# Patient Record
Sex: Female | Born: 1940
Health system: Southern US, Community
[De-identification: ages and names within clinical notes are randomized; demographics above are authoritative.]

## PROBLEM LIST (undated history)

## (undated) DIAGNOSIS — IMO0001 Reserved for inherently not codable concepts without codable children: Secondary | ICD-10-CM

## (undated) DIAGNOSIS — Z862 Personal history of diseases of the blood and blood-forming organs and certain disorders involving the immune mechanism: Secondary | ICD-10-CM

## (undated) DIAGNOSIS — D649 Anemia, unspecified: Secondary | ICD-10-CM

## (undated) DIAGNOSIS — I10 Essential (primary) hypertension: Secondary | ICD-10-CM

## (undated) DIAGNOSIS — F4310 Post-traumatic stress disorder, unspecified: Secondary | ICD-10-CM

## (undated) DIAGNOSIS — F329 Major depressive disorder, single episode, unspecified: Secondary | ICD-10-CM

## (undated) DIAGNOSIS — I2510 Atherosclerotic heart disease of native coronary artery without angina pectoris: Secondary | ICD-10-CM

## (undated) DIAGNOSIS — F32A Depression, unspecified: Secondary | ICD-10-CM

## (undated) DIAGNOSIS — Z8639 Personal history of other endocrine, nutritional and metabolic disease: Secondary | ICD-10-CM

## (undated) DIAGNOSIS — E785 Hyperlipidemia, unspecified: Secondary | ICD-10-CM

## (undated) DIAGNOSIS — Z5189 Encounter for other specified aftercare: Secondary | ICD-10-CM

## (undated) DIAGNOSIS — M1990 Unspecified osteoarthritis, unspecified site: Secondary | ICD-10-CM

## (undated) DIAGNOSIS — J189 Pneumonia, unspecified organism: Secondary | ICD-10-CM

## (undated) DIAGNOSIS — M7020 Olecranon bursitis, unspecified elbow: Secondary | ICD-10-CM

## (undated) DIAGNOSIS — R011 Cardiac murmur, unspecified: Secondary | ICD-10-CM

## (undated) DIAGNOSIS — E039 Hypothyroidism, unspecified: Secondary | ICD-10-CM

## (undated) HISTORY — DX: Essential (primary) hypertension: I10

## (undated) HISTORY — DX: Personal history of diseases of the blood and blood-forming organs and certain disorders involving the immune mechanism: Z86.2

## (undated) HISTORY — PX: DILATION AND CURETTAGE OF UTERUS: SHX78

## (undated) HISTORY — DX: Hypothyroidism, unspecified: E03.9

## (undated) HISTORY — PX: KNEE ARTHROSCOPY: SUR90

## (undated) HISTORY — DX: Atherosclerotic heart disease of native coronary artery without angina pectoris: I25.10

## (undated) HISTORY — PX: EXPLORATORY LAPAROTOMY: SUR591

## (undated) HISTORY — PX: JOINT REPLACEMENT: SHX530

## (undated) HISTORY — DX: Unspecified osteoarthritis, unspecified site: M19.90

## (undated) HISTORY — PX: COLONOSCOPY: SHX174

## (undated) HISTORY — DX: Personal history of other endocrine, nutritional and metabolic disease: Z86.39

## (undated) HISTORY — PX: FRACTURE SURGERY: SHX138

## (undated) HISTORY — PX: TONSILLECTOMY AND ADENOIDECTOMY: SUR1326

## (undated) HISTORY — PX: TOTAL SHOULDER REPLACEMENT: SUR1217

## (undated) HISTORY — DX: Anemia, unspecified: D64.9

## (undated) HISTORY — DX: Hyperlipidemia, unspecified: E78.5

## (undated) HISTORY — PX: OTHER SURGICAL HISTORY: SHX169

## (undated) HISTORY — DX: Olecranon bursitis, unspecified elbow: M70.20

---

## 1974-02-20 DIAGNOSIS — F4310 Post-traumatic stress disorder, unspecified: Secondary | ICD-10-CM

## 1974-02-20 HISTORY — DX: Post-traumatic stress disorder, unspecified: F43.10

## 1979-02-21 HISTORY — PX: ABDOMINAL HYSTERECTOMY: SHX81

## 1979-02-21 HISTORY — PX: APPENDECTOMY: SHX54

## 1988-10-21 HISTORY — PX: SHOULDER ARTHROSCOPY W/ ROTATOR CUFF REPAIR: SHX2400

## 1988-10-21 HISTORY — PX: PARTIAL KNEE ARTHROPLASTY: SHX2174

## 1988-10-21 HISTORY — PX: CARPAL TUNNEL RELEASE: SHX101

## 1988-10-21 HISTORY — PX: TRANSFER / TRANSPLANT ANKLE TENDON SUPERFICIAL / DEEP: SUR1365

## 1988-10-21 HISTORY — PX: CHOLECYSTECTOMY: SHX55

## 1998-03-01 ENCOUNTER — Encounter: Payer: Self-pay | Admitting: Orthopedic Surgery

## 1998-03-01 ENCOUNTER — Ambulatory Visit (HOSPITAL_COMMUNITY): Admission: RE | Admit: 1998-03-01 | Discharge: 1998-03-01 | Payer: Self-pay | Admitting: Orthopedic Surgery

## 1998-05-17 ENCOUNTER — Other Ambulatory Visit: Admission: RE | Admit: 1998-05-17 | Discharge: 1998-05-17 | Payer: Self-pay | Admitting: Gynecology

## 1998-05-28 ENCOUNTER — Ambulatory Visit (HOSPITAL_COMMUNITY): Admission: RE | Admit: 1998-05-28 | Discharge: 1998-05-28 | Payer: Self-pay | Admitting: Interventional Cardiology

## 1998-09-01 ENCOUNTER — Ambulatory Visit (HOSPITAL_COMMUNITY): Admission: RE | Admit: 1998-09-01 | Discharge: 1998-09-01 | Payer: Self-pay | Admitting: Gastroenterology

## 1998-10-22 HISTORY — PX: SHOULDER ARTHROSCOPY: SHX128

## 1999-07-13 ENCOUNTER — Other Ambulatory Visit: Admission: RE | Admit: 1999-07-13 | Discharge: 1999-07-13 | Payer: Self-pay | Admitting: Gynecology

## 2000-04-12 ENCOUNTER — Ambulatory Visit (HOSPITAL_BASED_OUTPATIENT_CLINIC_OR_DEPARTMENT_OTHER): Admission: RE | Admit: 2000-04-12 | Discharge: 2000-04-12 | Payer: Self-pay | Admitting: Orthopedic Surgery

## 2000-04-12 ENCOUNTER — Encounter (INDEPENDENT_AMBULATORY_CARE_PROVIDER_SITE_OTHER): Payer: Self-pay | Admitting: Specialist

## 2000-06-01 ENCOUNTER — Encounter: Payer: Self-pay | Admitting: Emergency Medicine

## 2000-06-01 ENCOUNTER — Emergency Department (HOSPITAL_COMMUNITY): Admission: EM | Admit: 2000-06-01 | Discharge: 2000-06-01 | Payer: Self-pay | Admitting: Emergency Medicine

## 2001-01-19 ENCOUNTER — Encounter: Payer: Self-pay | Admitting: Emergency Medicine

## 2001-01-19 ENCOUNTER — Emergency Department (HOSPITAL_COMMUNITY): Admission: EM | Admit: 2001-01-19 | Discharge: 2001-01-19 | Payer: Self-pay | Admitting: Emergency Medicine

## 2001-02-20 HISTORY — PX: CARDIAC VALVE REPLACEMENT: SHX585

## 2001-02-20 HISTORY — PX: CARDIAC CATHETERIZATION: SHX172

## 2001-02-20 HISTORY — PX: CORONARY ARTERY BYPASS GRAFT: SHX141

## 2001-07-26 ENCOUNTER — Encounter: Admission: RE | Admit: 2001-07-26 | Discharge: 2001-07-26 | Payer: Self-pay | Admitting: Interventional Cardiology

## 2001-07-26 ENCOUNTER — Encounter: Payer: Self-pay | Admitting: Interventional Cardiology

## 2001-07-29 ENCOUNTER — Ambulatory Visit (HOSPITAL_COMMUNITY): Admission: RE | Admit: 2001-07-29 | Discharge: 2001-07-29 | Payer: Self-pay | Admitting: Interventional Cardiology

## 2001-09-16 ENCOUNTER — Encounter: Payer: Self-pay | Admitting: Cardiothoracic Surgery

## 2001-09-18 ENCOUNTER — Encounter: Payer: Self-pay | Admitting: Cardiothoracic Surgery

## 2001-09-18 ENCOUNTER — Encounter (INDEPENDENT_AMBULATORY_CARE_PROVIDER_SITE_OTHER): Payer: Self-pay | Admitting: Specialist

## 2001-09-18 ENCOUNTER — Inpatient Hospital Stay (HOSPITAL_COMMUNITY): Admission: RE | Admit: 2001-09-18 | Discharge: 2001-09-23 | Payer: Self-pay | Admitting: Cardiothoracic Surgery

## 2001-09-19 ENCOUNTER — Encounter: Payer: Self-pay | Admitting: Cardiothoracic Surgery

## 2001-09-20 ENCOUNTER — Encounter: Payer: Self-pay | Admitting: Cardiothoracic Surgery

## 2001-09-22 ENCOUNTER — Encounter: Payer: Self-pay | Admitting: Thoracic Surgery (Cardiothoracic Vascular Surgery)

## 2001-10-28 ENCOUNTER — Encounter (HOSPITAL_COMMUNITY): Admission: RE | Admit: 2001-10-28 | Discharge: 2002-01-26 | Payer: Self-pay | Admitting: Interventional Cardiology

## 2001-11-28 ENCOUNTER — Encounter: Payer: Self-pay | Admitting: Cardiothoracic Surgery

## 2001-11-28 ENCOUNTER — Encounter: Admission: RE | Admit: 2001-11-28 | Discharge: 2001-11-28 | Payer: Self-pay | Admitting: Cardiothoracic Surgery

## 2002-01-27 ENCOUNTER — Encounter (HOSPITAL_COMMUNITY): Admission: RE | Admit: 2002-01-27 | Discharge: 2002-02-03 | Payer: Self-pay | Admitting: Interventional Cardiology

## 2002-06-05 ENCOUNTER — Encounter: Payer: Self-pay | Admitting: Cardiothoracic Surgery

## 2002-06-05 ENCOUNTER — Encounter: Admission: RE | Admit: 2002-06-05 | Discharge: 2002-06-05 | Payer: Self-pay | Admitting: Cardiothoracic Surgery

## 2002-08-07 ENCOUNTER — Other Ambulatory Visit: Admission: RE | Admit: 2002-08-07 | Discharge: 2002-08-07 | Payer: Self-pay | Admitting: Gynecology

## 2003-11-16 ENCOUNTER — Ambulatory Visit (HOSPITAL_COMMUNITY): Admission: RE | Admit: 2003-11-16 | Discharge: 2003-11-16 | Payer: Self-pay | Admitting: Gastroenterology

## 2003-11-16 ENCOUNTER — Encounter (INDEPENDENT_AMBULATORY_CARE_PROVIDER_SITE_OTHER): Payer: Self-pay | Admitting: *Deleted

## 2003-12-30 ENCOUNTER — Ambulatory Visit: Payer: Self-pay | Admitting: Internal Medicine

## 2004-03-22 ENCOUNTER — Ambulatory Visit: Payer: Self-pay | Admitting: Internal Medicine

## 2004-08-08 ENCOUNTER — Encounter: Payer: Self-pay | Admitting: Internal Medicine

## 2004-11-10 ENCOUNTER — Ambulatory Visit: Payer: Self-pay | Admitting: Internal Medicine

## 2005-04-11 ENCOUNTER — Ambulatory Visit: Payer: Self-pay | Admitting: Internal Medicine

## 2005-05-23 ENCOUNTER — Ambulatory Visit: Payer: Self-pay | Admitting: Internal Medicine

## 2005-06-07 ENCOUNTER — Ambulatory Visit: Payer: Self-pay | Admitting: Internal Medicine

## 2005-12-19 ENCOUNTER — Ambulatory Visit: Payer: Self-pay | Admitting: Internal Medicine

## 2005-12-19 LAB — CONVERTED CEMR LAB
AST: 29 units/L (ref 0–37)
Alkaline Phosphatase: 71 units/L (ref 39–117)
Cholesterol: 184 mg/dL (ref 0–200)
Total Protein: 7.2 g/dL (ref 6.0–8.3)

## 2006-02-01 ENCOUNTER — Encounter: Admission: RE | Admit: 2006-02-01 | Discharge: 2006-02-01 | Payer: Self-pay | Admitting: Cardiothoracic Surgery

## 2006-02-07 ENCOUNTER — Encounter: Payer: Self-pay | Admitting: Internal Medicine

## 2006-02-15 ENCOUNTER — Inpatient Hospital Stay (HOSPITAL_COMMUNITY): Admission: RE | Admit: 2006-02-15 | Discharge: 2006-02-17 | Payer: Self-pay | Admitting: Orthopedic Surgery

## 2006-04-26 ENCOUNTER — Ambulatory Visit: Payer: Self-pay | Admitting: Internal Medicine

## 2006-09-07 ENCOUNTER — Encounter: Admission: RE | Admit: 2006-09-07 | Discharge: 2006-09-07 | Payer: Self-pay | Admitting: Specialist

## 2006-09-14 DIAGNOSIS — I10 Essential (primary) hypertension: Secondary | ICD-10-CM

## 2006-09-14 DIAGNOSIS — E039 Hypothyroidism, unspecified: Secondary | ICD-10-CM

## 2006-09-14 HISTORY — DX: Essential (primary) hypertension: I10

## 2006-09-14 HISTORY — DX: Hypothyroidism, unspecified: E03.9

## 2006-10-01 ENCOUNTER — Encounter: Payer: Self-pay | Admitting: Internal Medicine

## 2006-12-10 ENCOUNTER — Telehealth: Payer: Self-pay | Admitting: Internal Medicine

## 2006-12-19 ENCOUNTER — Ambulatory Visit: Payer: Self-pay | Admitting: Internal Medicine

## 2006-12-21 ENCOUNTER — Encounter: Admission: RE | Admit: 2006-12-21 | Discharge: 2006-12-21 | Payer: Self-pay | Admitting: Orthopedic Surgery

## 2007-01-07 ENCOUNTER — Encounter: Payer: Self-pay | Admitting: Internal Medicine

## 2007-01-31 ENCOUNTER — Telehealth: Payer: Self-pay | Admitting: Internal Medicine

## 2007-02-07 ENCOUNTER — Ambulatory Visit: Payer: Self-pay | Admitting: Internal Medicine

## 2007-02-07 DIAGNOSIS — E785 Hyperlipidemia, unspecified: Secondary | ICD-10-CM

## 2007-02-07 DIAGNOSIS — M1990 Unspecified osteoarthritis, unspecified site: Secondary | ICD-10-CM

## 2007-02-07 HISTORY — DX: Hyperlipidemia, unspecified: E78.5

## 2007-02-07 HISTORY — DX: Unspecified osteoarthritis, unspecified site: M19.90

## 2007-02-08 ENCOUNTER — Encounter: Payer: Self-pay | Admitting: Internal Medicine

## 2007-03-15 ENCOUNTER — Inpatient Hospital Stay (HOSPITAL_COMMUNITY): Admission: RE | Admit: 2007-03-15 | Discharge: 2007-03-17 | Payer: Self-pay | Admitting: Orthopedic Surgery

## 2007-07-04 ENCOUNTER — Encounter: Payer: Self-pay | Admitting: Internal Medicine

## 2007-07-31 ENCOUNTER — Ambulatory Visit: Payer: Self-pay | Admitting: Internal Medicine

## 2007-07-31 DIAGNOSIS — N390 Urinary tract infection, site not specified: Secondary | ICD-10-CM | POA: Insufficient documentation

## 2007-07-31 LAB — CONVERTED CEMR LAB
Bilirubin Urine: NEGATIVE
Glucose, Urine, Semiquant: NEGATIVE
Urobilinogen, UA: NEGATIVE
WBC Urine, dipstick: NEGATIVE

## 2007-08-26 ENCOUNTER — Ambulatory Visit: Payer: Self-pay | Admitting: Internal Medicine

## 2007-08-26 DIAGNOSIS — I2510 Atherosclerotic heart disease of native coronary artery without angina pectoris: Secondary | ICD-10-CM

## 2007-08-26 DIAGNOSIS — M7020 Olecranon bursitis, unspecified elbow: Secondary | ICD-10-CM

## 2007-08-26 HISTORY — DX: Olecranon bursitis, unspecified elbow: M70.20

## 2007-08-26 HISTORY — DX: Atherosclerotic heart disease of native coronary artery without angina pectoris: I25.10

## 2007-09-11 ENCOUNTER — Telehealth: Payer: Self-pay | Admitting: Internal Medicine

## 2007-10-30 ENCOUNTER — Encounter: Payer: Self-pay | Admitting: Internal Medicine

## 2007-11-29 ENCOUNTER — Ambulatory Visit: Payer: Self-pay | Admitting: Internal Medicine

## 2007-11-29 DIAGNOSIS — S93409A Sprain of unspecified ligament of unspecified ankle, initial encounter: Secondary | ICD-10-CM | POA: Insufficient documentation

## 2008-01-24 ENCOUNTER — Encounter: Payer: Self-pay | Admitting: Internal Medicine

## 2008-04-09 ENCOUNTER — Ambulatory Visit: Payer: Self-pay | Admitting: Internal Medicine

## 2008-04-09 LAB — CONVERTED CEMR LAB
Bilirubin Urine: NEGATIVE
Blood in Urine, dipstick: NEGATIVE
Nitrite: NEGATIVE
Protein, U semiquant: NEGATIVE
Specific Gravity, Urine: 1.02
WBC Urine, dipstick: NEGATIVE

## 2008-04-20 ENCOUNTER — Telehealth: Payer: Self-pay | Admitting: Internal Medicine

## 2008-04-20 ENCOUNTER — Ambulatory Visit: Payer: Self-pay | Admitting: Internal Medicine

## 2008-04-20 DIAGNOSIS — J069 Acute upper respiratory infection, unspecified: Secondary | ICD-10-CM | POA: Insufficient documentation

## 2008-05-29 ENCOUNTER — Inpatient Hospital Stay (HOSPITAL_COMMUNITY): Admission: RE | Admit: 2008-05-29 | Discharge: 2008-06-01 | Payer: Self-pay | Admitting: Orthopedic Surgery

## 2008-08-16 ENCOUNTER — Encounter: Admission: RE | Admit: 2008-08-16 | Discharge: 2008-08-16 | Payer: Self-pay | Admitting: Orthopedic Surgery

## 2008-11-30 ENCOUNTER — Ambulatory Visit: Payer: Self-pay | Admitting: Internal Medicine

## 2008-12-03 LAB — CONVERTED CEMR LAB
ALT: 61 units/L — ABNORMAL HIGH (ref 0–35)
AST: 106 units/L — ABNORMAL HIGH (ref 0–37)
Alkaline Phosphatase: 103 units/L (ref 39–117)
Basophils Relative: 0.1 % (ref 0.0–3.0)
CO2: 31 meq/L (ref 19–32)
Calcium: 9.5 mg/dL (ref 8.4–10.5)
Chloride: 99 meq/L (ref 96–112)
Eosinophils Absolute: 0.1 10*3/uL (ref 0.0–0.7)
Lymphocytes Relative: 16.7 % (ref 12.0–46.0)
MCHC: 32.9 g/dL (ref 30.0–36.0)
Neutro Abs: 2.7 10*3/uL (ref 1.4–7.7)
Neutrophils Relative %: 69.3 % (ref 43.0–77.0)
Potassium: 3.7 meq/L (ref 3.5–5.1)
RBC: 3.9 M/uL (ref 3.87–5.11)
RDW: 14.9 % — ABNORMAL HIGH (ref 11.5–14.6)
TSH: 3.96 microintl units/mL (ref 0.35–5.50)
Total Protein: 7 g/dL (ref 6.0–8.3)

## 2008-12-04 ENCOUNTER — Telehealth: Payer: Self-pay | Admitting: Internal Medicine

## 2008-12-07 ENCOUNTER — Telehealth: Payer: Self-pay | Admitting: Internal Medicine

## 2009-01-12 ENCOUNTER — Ambulatory Visit: Payer: Self-pay | Admitting: Internal Medicine

## 2009-01-12 DIAGNOSIS — Z8639 Personal history of other endocrine, nutritional and metabolic disease: Secondary | ICD-10-CM

## 2009-01-12 DIAGNOSIS — Z862 Personal history of diseases of the blood and blood-forming organs and certain disorders involving the immune mechanism: Secondary | ICD-10-CM

## 2009-01-12 HISTORY — DX: Personal history of diseases of the blood and blood-forming organs and certain disorders involving the immune mechanism: Z86.2

## 2009-01-19 LAB — CONVERTED CEMR LAB
Alkaline Phosphatase: 108 units/L (ref 39–117)
Bilirubin, Direct: 0.1 mg/dL (ref 0.0–0.3)

## 2009-04-27 ENCOUNTER — Ambulatory Visit: Payer: Self-pay | Admitting: Internal Medicine

## 2009-04-29 ENCOUNTER — Encounter: Payer: Self-pay | Admitting: Internal Medicine

## 2009-04-29 ENCOUNTER — Telehealth: Payer: Self-pay | Admitting: Internal Medicine

## 2009-05-03 ENCOUNTER — Telehealth: Payer: Self-pay | Admitting: Internal Medicine

## 2009-05-21 ENCOUNTER — Telehealth: Payer: Self-pay | Admitting: Internal Medicine

## 2009-06-18 ENCOUNTER — Inpatient Hospital Stay (HOSPITAL_COMMUNITY): Admission: RE | Admit: 2009-06-18 | Discharge: 2009-06-21 | Payer: Self-pay | Admitting: Orthopedic Surgery

## 2009-08-05 ENCOUNTER — Telehealth: Payer: Self-pay | Admitting: Internal Medicine

## 2009-08-06 ENCOUNTER — Ambulatory Visit: Payer: Self-pay | Admitting: Internal Medicine

## 2009-08-06 DIAGNOSIS — H9209 Otalgia, unspecified ear: Secondary | ICD-10-CM | POA: Insufficient documentation

## 2009-09-14 ENCOUNTER — Encounter: Payer: Self-pay | Admitting: Internal Medicine

## 2009-09-15 ENCOUNTER — Telehealth: Payer: Self-pay | Admitting: Internal Medicine

## 2009-09-15 ENCOUNTER — Ambulatory Visit: Payer: Self-pay | Admitting: Internal Medicine

## 2009-09-15 DIAGNOSIS — D649 Anemia, unspecified: Secondary | ICD-10-CM

## 2009-09-15 HISTORY — DX: Anemia, unspecified: D64.9

## 2009-09-17 ENCOUNTER — Ambulatory Visit: Payer: Self-pay | Admitting: Internal Medicine

## 2009-09-17 LAB — CONVERTED CEMR LAB
Basophils Absolute: 0 10*3/uL (ref 0.0–0.1)
Basophils Relative: 0.1 % (ref 0.0–3.0)
Eosinophils Absolute: 0.1 10*3/uL (ref 0.0–0.7)
Eosinophils Relative: 0.4 % (ref 0.0–5.0)
HCT: 22 % — CL (ref 36.0–46.0)
Hemoglobin: 7.4 g/dL — CL (ref 12.0–15.0)
Lymphocytes Relative: 12.2 % (ref 12.0–46.0)
MCV: 94.6 fL (ref 78.0–100.0)
Monocytes Absolute: 1.3 10*3/uL — ABNORMAL HIGH (ref 0.1–1.0)
Monocytes Relative: 9.2 % (ref 3.0–12.0)
Neutro Abs: 11.4 10*3/uL — ABNORMAL HIGH (ref 1.4–7.7)
Neutrophils Relative %: 78.1 % — ABNORMAL HIGH (ref 43.0–77.0)
Platelets: 433 10*3/uL — ABNORMAL HIGH (ref 150.0–400.0)
RDW: 16.2 % — ABNORMAL HIGH (ref 11.5–14.6)

## 2009-09-18 ENCOUNTER — Inpatient Hospital Stay (HOSPITAL_COMMUNITY): Admission: EM | Admit: 2009-09-18 | Discharge: 2009-09-19 | Payer: Self-pay | Admitting: Emergency Medicine

## 2009-09-24 ENCOUNTER — Ambulatory Visit: Payer: Self-pay | Admitting: Internal Medicine

## 2009-09-24 LAB — CONVERTED CEMR LAB
Basophils Absolute: 0 10*3/uL (ref 0.0–0.1)
Basophils Relative: 0.5 % (ref 0.0–3.0)
Eosinophils Absolute: 0.1 10*3/uL (ref 0.0–0.7)
Hemoglobin: 10.6 g/dL — ABNORMAL LOW (ref 12.0–15.0)
Lymphocytes Relative: 14.4 % (ref 12.0–46.0)
Monocytes Absolute: 0.8 10*3/uL (ref 0.1–1.0)
Platelets: 281 10*3/uL (ref 150.0–400.0)

## 2009-10-31 ENCOUNTER — Emergency Department (HOSPITAL_COMMUNITY): Admission: EM | Admit: 2009-10-31 | Discharge: 2009-10-31 | Payer: Self-pay | Admitting: Emergency Medicine

## 2009-11-02 ENCOUNTER — Emergency Department (HOSPITAL_COMMUNITY): Admission: EM | Admit: 2009-11-02 | Discharge: 2009-11-02 | Payer: Self-pay | Admitting: Family Medicine

## 2009-11-04 ENCOUNTER — Emergency Department (HOSPITAL_COMMUNITY): Admission: EM | Admit: 2009-11-04 | Discharge: 2009-11-04 | Payer: Self-pay | Admitting: Family Medicine

## 2009-11-10 ENCOUNTER — Encounter (HOSPITAL_BASED_OUTPATIENT_CLINIC_OR_DEPARTMENT_OTHER)
Admission: RE | Admit: 2009-11-10 | Discharge: 2010-02-08 | Payer: Self-pay | Source: Home / Self Care | Attending: General Surgery | Admitting: General Surgery

## 2009-11-18 ENCOUNTER — Encounter: Admission: RE | Admit: 2009-11-18 | Discharge: 2009-11-18 | Payer: Self-pay | Admitting: Orthopedic Surgery

## 2009-11-22 ENCOUNTER — Ambulatory Visit (HOSPITAL_COMMUNITY)
Admission: RE | Admit: 2009-11-22 | Discharge: 2009-11-22 | Payer: Self-pay | Source: Home / Self Care | Admitting: General Surgery

## 2009-11-30 ENCOUNTER — Encounter: Admission: RE | Admit: 2009-11-30 | Discharge: 2009-11-30 | Payer: Self-pay | Admitting: Orthopedic Surgery

## 2009-12-14 ENCOUNTER — Ambulatory Visit: Payer: Self-pay | Admitting: Internal Medicine

## 2009-12-14 LAB — CONVERTED CEMR LAB
Basophils Absolute: 0 10*3/uL (ref 0.0–0.1)
Eosinophils Absolute: 0.1 10*3/uL (ref 0.0–0.7)
HCT: 37 % (ref 36.0–46.0)
Hemoglobin: 12.5 g/dL (ref 12.0–15.0)
Lymphocytes Relative: 17.9 % (ref 12.0–46.0)
Monocytes Relative: 10 % (ref 3.0–12.0)
Neutro Abs: 4.5 10*3/uL (ref 1.4–7.7)
Neutrophils Relative %: 69.7 % (ref 43.0–77.0)
Platelets: 269 10*3/uL (ref 150.0–400.0)
RDW: 15.7 % — ABNORMAL HIGH (ref 11.5–14.6)

## 2010-01-17 ENCOUNTER — Telehealth: Payer: Self-pay | Admitting: Internal Medicine

## 2010-02-10 ENCOUNTER — Encounter (HOSPITAL_BASED_OUTPATIENT_CLINIC_OR_DEPARTMENT_OTHER)
Admission: RE | Admit: 2010-02-10 | Discharge: 2010-03-08 | Payer: Self-pay | Source: Home / Self Care | Attending: General Surgery | Admitting: General Surgery

## 2010-03-08 ENCOUNTER — Ambulatory Visit
Admission: RE | Admit: 2010-03-08 | Discharge: 2010-03-08 | Payer: Self-pay | Source: Home / Self Care | Attending: Internal Medicine | Admitting: Internal Medicine

## 2010-03-12 ENCOUNTER — Encounter: Payer: Self-pay | Admitting: Orthopedic Surgery

## 2010-03-13 ENCOUNTER — Encounter: Payer: Self-pay | Admitting: Cardiothoracic Surgery

## 2010-03-14 ENCOUNTER — Encounter
Admission: RE | Admit: 2010-03-14 | Discharge: 2010-03-14 | Payer: Self-pay | Source: Home / Self Care | Attending: Orthopedic Surgery | Admitting: Orthopedic Surgery

## 2010-03-24 NOTE — Assessment & Plan Note (Signed)
Summary: CONSTANT COUGHING FOR 3 WEEKS//ALP   Vital Signs:  Patient profile:   70 year old female Height:      62.25 inches Weight:      125 pounds BMI:     22.76 Temp:     98.1 degrees F oral Pulse rate:   70 / minute Resp:     14 per minute BP sitting:   130 / 80  (left arm)  Vitals Entered By: Willy Eddy, LPN (December 14, 2009 11:03 AM) CC: c/o non productive cough Is Patient Diabetic? No   CC:  c/o non productive cough.  History of Present Illness: a 70 year old patient who presents with a chief complaint call for 3 weeks duration.  Cough is nonproductive.  She presently is on antibiotic therapy for a wound infection involving the left leg.  A wound VAC is also in place, and she is followed closely by the wound center.  Presently is on doxycycline.  Denies any fever or chills.  She does have a history of hypertension, dyslipidemia, coronary artery disease.  She has a history of recent acute blood loss anemia  Preventive Screening-Counseling & Management  Alcohol-Tobacco     Smoking Status: never  Current Problems (verified): 1)  Coumadin Therapy  (ICD-V58.61) 2)  Anemia  (ICD-285.9) 3)  Ear Pain, Right  (ICD-388.70) 4)  Liver Function Tests, Abnormal, Hx of  (ICD-V12.2) 5)  Uri  (ICD-465.9) 6)  Ankle Sprain, Right  (ICD-845.00) 7)  Olecranon Bursitis, Left  (ICD-726.33) 8)  Coronary Artery Disease  (ICD-414.00) 9)  Uti  (ICD-599.0) 10)  Osteoarthritis  (ICD-715.90) 11)  Hyperlipidemia  (ICD-272.4) 12)  Hypothyroidism  (ICD-244.9) 13)  Hypertension  (ICD-401.9)  Current Medications (verified): 1)  Budeprion Sr 150 Mg Tb12 (Bupropion Hcl) .Marland Kitchen.. 1 Qd 2)  Lipitor 20 Mg Tabs (Atorvastatin Calcium) .Marland Kitchen.. 1 Once Daily 3)  Synthroid 100 Mcg Tabs (Levothyroxine Sodium) .Marland Kitchen.. 1 Once Daily 4)  Trazodone Hcl 100 Mg Tabs (Trazodone Hcl) .Marland Kitchen.. 1 At Bedtime 5)  Verapamil Hcl Cr 240 Mg Tbcr (Verapamil Hcl) .Marland Kitchen.. 1 Once Daily 6)  Warfarin Sodium 5 Mg Tabs (Warfarin Sodium) ....  As Dir 7)  Vicodin 5-500 Mg  Tabs (Hydrocodone-Acetaminophen) .Marland Kitchen.. 1 Q6h As Needed 8)  Ferrous Sulfate 325 (65 Fe) Mg Tabs (Ferrous Sulfate) .... Qd  Allergies (verified): 1)  Demerol (Meperidine Hcl) 2)  Dilaudid 3)  Morphine Sulfate (Morphine Sulfate)  Past History:  Past Medical History: Reviewed history from 09/17/2009 and no changes required. Hypertension Hypothyroidism Hyperlipidemia status post mechanical aortic valve repair history of anxiety, depression Osteoarthritis Coronary artery disease hyperparathyroidism postoperative anemia  Past Surgical History: Reviewed history from 08/06/2009 and no changes required. Cholecystectomy laparoscopic 1999 Hysterectomy Multiple knee, ankle and wrist sx colonoscopy in 2000  multiple orthopedic procedures status post CABG and aortic valve repair 2003 R Shoulder surgery (Rotator cuff repair)  right total shoulder replacement surgery April 2011  Review of Systems       The patient complains of prolonged cough.  The patient denies anorexia, fever, weight loss, weight gain, vision loss, decreased hearing, hoarseness, chest pain, syncope, dyspnea on exertion, peripheral edema, headaches, hemoptysis, abdominal pain, melena, hematochezia, severe indigestion/heartburn, hematuria, incontinence, genital sores, muscle weakness, suspicious skin lesions, transient blindness, difficulty walking, depression, unusual weight change, abnormal bleeding, enlarged lymph nodes, angioedema, and breast masses.    Physical Exam  General:  Well-developed,well-nourished,in no acute distress; alert,appropriate and cooperative throughout examination; blood pressure, normal Head:  Normocephalic and atraumatic without obvious  abnormalities. No apparent alopecia or balding. Eyes:  No corneal or conjunctival inflammation noted. EOMI. Perrla. Funduscopic exam benign, without hemorrhages, exudates or papilledema. Vision grossly normal. Ears:  both canals  occluded by cerumen Nose:  External nasal examination shows no deformity or inflammation. Nasal mucosa are pink and moist without lesions or exudates. Mouth:  Oral mucosa and oropharynx without lesions or exudates.  Teeth in good repair. Neck:  No deformities, masses, or tenderness noted. Chest Wall:  No deformities, masses, or tenderness noted. Lungs:  Normal respiratory effort, chest expands symmetrically. Lungs are clear to auscultation, no crackles or wheezes. Heart:  regular rhythm prosthetic heart sounds number Abdomen:  Bowel sounds positive,abdomen soft and non-tender without masses, organomegaly or hernias noted. Extremities:  wound VAC in place.  Left lower leg   Impression & Recommendations:  Problem # 1:  ANEMIA (ICD-285.9)  Her updated medication list for this problem includes:    Ferrous Sulfate 325 (65 Fe) Mg Tabs (Ferrous sulfate) ..... Qd    Her updated medication list for this problem includes:    Ferrous Sulfate 325 (65 Fe) Mg Tabs (Ferrous sulfate) ..... Qd  Orders: Venipuncture (08657) TLB-CBC Platelet - w/Differential (85025-CBCD) Specimen Handling (84696)  Problem # 2:  COUMADIN THERAPY (ICD-V58.61)  Problem # 3:  URI (ICD-465.9)  Her updated medication list for this problem includes:    Hydrocodone-homatropine 5-1.5 Mg/88ml Syrp (Hydrocodone-homatropine) .Marland Kitchen... 1 teaspoon every 6 hours as needed for cough  Her updated medication list for this problem includes:    Hydrocodone-homatropine 5-1.5 Mg/80ml Syrp (Hydrocodone-homatropine) .Marland Kitchen... 1 teaspoon every 6 hours as needed for cough  Complete Medication List: 1)  Budeprion Sr 150 Mg Tb12 (Bupropion hcl) .Marland Kitchen.. 1 qd 2)  Lipitor 20 Mg Tabs (Atorvastatin calcium) .Marland Kitchen.. 1 once daily 3)  Synthroid 100 Mcg Tabs (Levothyroxine sodium) .Marland Kitchen.. 1 once daily 4)  Trazodone Hcl 100 Mg Tabs (Trazodone hcl) .Marland Kitchen.. 1 at bedtime 5)  Verapamil Hcl Cr 240 Mg Tbcr (Verapamil hcl) .Marland Kitchen.. 1 once daily 6)  Warfarin Sodium 5 Mg Tabs  (Warfarin sodium) .... As dir 7)  Vicodin 5-500 Mg Tabs (Hydrocodone-acetaminophen) .Marland Kitchen.. 1 q6h as needed 8)  Ferrous Sulfate 325 (65 Fe) Mg Tabs (Ferrous sulfate) .... Qd 9)  Hydrocodone-homatropine 5-1.5 Mg/88ml Syrp (Hydrocodone-homatropine) .Marland Kitchen.. 1 teaspoon every 6 hours as needed for cough  Patient Instructions: 1)  Please schedule a follow-up appointment in 3 months. 2)  Limit your Sodium (Salt). 3)  Get plenty of rest, drink lots of clear liquids, and use Tylenol  for fever and comfort. Return in 7-10 days if you're not better:sooner if you're feeling worse. Prescriptions: HYDROCODONE-HOMATROPINE 5-1.5 MG/5ML SYRP (HYDROCODONE-HOMATROPINE) 1 teaspoon every 6 hours as needed for cough  #6 oz x 0   Entered and Authorized by:   Gordy Savers  MD   Signed by:   Gordy Savers  MD on 12/14/2009   Method used:   Print then Give to Patient   RxID:   [id number]    Orders Added: 1)  Venipuncture [25366] 2)  TLB-CBC Platelet - w/Differential [85025-CBCD] 3)  Est. Patient Level III [44034] 4)  Specimen Handling [99000]

## 2010-03-24 NOTE — Assessment & Plan Note (Signed)
Summary: f/u bloodwk - ok per Dr/ K     KIK   Vital Signs:  Patient profile:   70 year old female Temp:     98.0 degrees F oral BP sitting:   100 / 60  (right arm) Cuff size:   regular  Vitals Entered By: Duard Brady LPN (September 24, 2009 2:39 PM) CC: F/U  on labs and blood rec'd this past weekend Is Patient Diabetic? No   CC:  F/U  on labs and blood rec'd this past weekend.  History of Present Illness: 70 year old patient who is [facility] for blood loss anemia.  She is on chronic Coumadin and received a cortisone injection in the left hip area.  This resulted in considerable blood loss in the soft tissues.  Hemoglobin dropped to 7.4, and she was admitted and received one unit of packed RBCs.  She was discharged 5 days ago and has done well.  Still slightly weak, but improving.  She remains on chronic Coumadin, status post about replacement surgery  Allergies: 1)  Demerol (Meperidine Hcl) 2)  Dilaudid 3)  Morphine Sulfate (Morphine Sulfate)  Past History:  Past Medical History: Reviewed history from 09/17/2009 and no changes required. Hypertension Hypothyroidism Hyperlipidemia status post mechanical aortic valve repair history of anxiety, depression Osteoarthritis Coronary artery disease hyperparathyroidism postoperative anemia  Review of Systems       The patient complains of muscle weakness.  The patient denies anorexia, fever, weight loss, weight gain, vision loss, decreased hearing, hoarseness, chest pain, syncope, dyspnea on exertion, peripheral edema, prolonged cough, headaches, hemoptysis, abdominal pain, melena, hematochezia, severe indigestion/heartburn, hematuria, incontinence, genital sores, suspicious skin lesions, transient blindness, difficulty walking, depression, unusual weight change, abnormal bleeding, enlarged lymph nodes, angioedema, and breast masses.    Physical Exam  General:  Well-developed,well-nourished,in no acute  distress; alert,appropriate and cooperative throughout examination Head:  Normocephalic and atraumatic without obvious abnormalities. No apparent alopecia or balding. Mouth:  Oral mucosa and oropharynx without lesions or exudates.  Teeth in good repair. Neck:  No deformities, masses, or tenderness noted. Lungs:  Normal respiratory effort, chest expands symmetrically. Lungs are clear to auscultation, no crackles or wheezes. Heart:  rate approximately 90 prosthetic heart sounds   Impression & Recommendations:  Problem # 1:  COUMADIN THERAPY (ICD-V58.61)  Orders: Protime (10272ZD)  Problem # 2:  ANEMIA (ICD-285.9)  Her updated medication list for this problem includes:    Ferrous Sulfate 325 (65 Fe) Mg Tabs (Ferrous sulfate) ..... Qd  Orders: Venipuncture (66440) TLB-CBC Platelet - w/Differential (85025-CBCD) Hgb (34742)  Her updated medication list for this problem includes:    Ferrous Sulfate 325 (65 Fe) Mg Tabs (Ferrous sulfate) ..... Qd  Complete Medication List: 1)  Budeprion Sr 150 Mg Tb12 (Bupropion hcl) .Marland Kitchen.. 1 qd 2)  Lipitor 20 Mg Tabs (Atorvastatin calcium) .Marland Kitchen.. 1 once daily 3)  Synthroid 100 Mcg Tabs (Levothyroxine sodium) .Marland Kitchen.. 1 once daily 4)  Trazodone Hcl 100 Mg Tabs (Trazodone hcl) .Marland Kitchen.. 1 at bedtime 5)  Verapamil Hcl Cr 240 Mg Tbcr (Verapamil hcl) .Marland Kitchen.. 1 once daily 6)  Warfarin Sodium 5 Mg Tabs (Warfarin sodium) .... As dir 7)  Vicodin 5-500 Mg Tabs (Hydrocodone-acetaminophen) .Marland Kitchen.. 1 q6h as needed 8)  Avapro 300 Mg Tabs (Irbesartan) .... Qd 9)  Ferrous Sulfate 325 (65 Fe) Mg Tabs (Ferrous sulfate) .... Qd  Patient Instructions: 1)  Please schedule a follow-up appointment in 2  months. 2)  Limit your Sodium (Salt).  Appended Document:  f/u bloodwk - ok per Dr/ K     KIK   ANTICOAGULATION RECORD PREVIOUS REGIMEN & LAB RESULTS   Previous INR:  1.4 on  09/17/2009  Previous Regimen:  same on  09/17/2009  NEW REGIMEN & LAB RESULTS Current INR: 1.0 Regimen:  Same Coagulation Comments: Was off coumadin for 6 days was in hosptial Repeat testing in: 2 weeks  Anticoagulation Visit Questionnaire Coumadin dose missed/changed:  Yes Coumadin Dose Comments:  outside prescriber changed dose or tablet strength Abnormal Bleeding Symptoms:  Yes  Any diet changes including alcohol intake, vegetables or greens since the last visit:  No Any illnesses or hospitalizations since the last visit:  Yes Any signs of clotting since the last visit (including chest discomfort, dizziness, shortness of breath, arm tingling, slurred speech, swelling or redness in leg):  No  MEDICATIONS BUDEPRION SR 150 MG TB12 (BUPROPION HCL) 1 qd LIPITOR 20 MG TABS (ATORVASTATIN CALCIUM) 1 once daily SYNTHROID 100 MCG TABS (LEVOTHYROXINE SODIUM) 1 once daily TRAZODONE HCL 100 MG TABS (TRAZODONE HCL) 1 at bedtime VERAPAMIL HCL CR 240 MG TBCR (VERAPAMIL HCL) 1 once daily WARFARIN SODIUM 5 MG TABS (WARFARIN SODIUM) as dir VICODIN 5-500 MG  TABS (HYDROCODONE-ACETAMINOPHEN) 1 q6h as needed AVAPRO 300 MG TABS (IRBESARTAN) qd FERROUS SULFATE 325 (65 FE) MG TABS (FERROUS SULFATE) qd    Laboratory Results   Blood Tests   Date/Time Received: September 24, 2009    INR: 1.0   (Normal Range: 0.88-1.12   Therap INR: 2.0-3.5) Comments: Kathrynn Speed CMA  September 24, 2009 3:51 PM

## 2010-03-24 NOTE — Letter (Signed)
Summary: Generic Letter  [facility]  [address] [place], [place] [address]   Phone: [phone]  Fax: [phone]    04/29/2009  [name] [address] [place], [place]  [address]  Dear Paula Ferguson:  [name] is a patient followed in our internal medicine practice.  She was evaluated recently for a preoperative evaluation prior to elective  shoulder surgery.  Her cardiac status is stable and she remains on chronic Coumadin anticoagulation following aortic valve replacement.  There are no contraindications for the anticipated surgery.  She will require bridge Lovenox anticoagulation in the perioperative period.  This will be managed by her cardiologist.  Please do not hesitate to contact this office if there are perioperative concerns.          Sincerely,   Paula Chiquito  MD  Appended Document: Generic Letter letter faxed Attn: Clydie Braun

## 2010-03-24 NOTE — Letter (Signed)
Summary: [facility]  [facility]   Imported By: Maryln Gottron 09/20/2009 12:52:57  _____________________________________________________________________  External Attachment:    Type:   Image     Comment:   External Document

## 2010-03-24 NOTE — Progress Notes (Signed)
Summary: medical clearence needed for surg.   Phone Note From Other Clinic   Caller: Nurse Call For: medical clearence for surg. Summary of Call: karen with Dr. [name] called requesting letter of medical clearence for shoulder surg. Fax [phone] Atten: Clydie Braun pt was seen  04/27/09 in our office.  Initial call taken by: Duard Brady LPN,  April 29, 2009 10:52 AM  Follow-up for Phone Call        will fax today Follow-up by: Gordy Savers  MD,  April 29, 2009 12:49 PM     Appended Document: medical clearence needed for surg.  faxed

## 2010-03-24 NOTE — Assessment & Plan Note (Signed)
Summary: hgb is 8 gm?/dm   Vital Signs:  Patient profile:   70 year old female Weight:      124 pounds Temp:     98.4 degrees F oral BP sitting:   120 / 70  (right arm) Cuff size:   regular  Vitals Entered By: Duard Brady LPN (September 15, 2009 10:27 AM) CC: c/o pain and 'knot' on (L) hip after injection from Dr. [name] last week , also was at [facility] for coumadin check - h/h very low Is Patient Diabetic? No   CC:  c/o pain and 'knot' on (L) hip after injection from Dr. [name] last week  and also was at [facility] for coumadin check - h/h very low.  History of Present Illness: 70 year old patient who has recent undergone right shoulder surgery on April 29.  Seven days ago, she received an apparent Cortizone  injection in the left hip area.  Since this time, she has had pain and swelling.  She also describes some minimal discomfort in the left medial thigh region.  She is on Coumadin anticoagulation.  Following aortic valve repair.  Yesterday she is noted to have significant postoperative anemia with a hemoglobin of 8.2.  She is treated hypertension and dyslipidemia.  INR was 3.8.  She held her Coumadin yesterday.  She also held.  Both her blood pressure medications today for the past week.  She has felt some increasing fatigue.  Denies any melena or abdominal pain  Allergies: 1)  Demerol (Meperidine Hcl) 2)  Dilaudid 3)  Morphine Sulfate (Morphine Sulfate)  Past History:  Past Medical History: Reviewed history from 08/26/2007 and no changes required. Hypertension Hypothyroidism Hyperlipidemia status post mechanical aortic valve repair history of anxiety, depression Osteoarthritis Coronary artery disease hyperparathyroidism  Review of Systems       The patient complains of muscle weakness and suspicious skin lesions.  The patient denies anorexia, fever, weight loss, weight gain, vision loss, decreased hearing, hoarseness, chest pain, syncope, dyspnea on exertion, peripheral edema,  prolonged cough, headaches, hemoptysis, abdominal pain, melena, hematochezia, severe indigestion/heartburn, hematuria, incontinence, genital sores, transient blindness, difficulty walking, depression, unusual weight change, abnormal bleeding, enlarged lymph nodes, angioedema, and breast masses.    Physical Exam  General:  Well-developed,well-nourished,in no acute distress; alert,appropriate and cooperative throughout examination; 120/70, pulse rate 104 Head:  Normocephalic and atraumatic without obvious abnormalities. No apparent alopecia or balding. Eyes:  No corneal or conjunctival inflammation noted. EOMI. Perrla. Funduscopic exam benign, without hemorrhages, exudates or papilledema. Vision grossly normal. Mouth:  Oral mucosa and oropharynx without lesions or exudates.  Teeth in good repair. Neck:  No deformities, masses, or tenderness noted. Lungs:  Normal respiratory effort, chest expands symmetrically. Lungs are clear to auscultation, no crackles or wheezes. Heart:  grade 2/6 murmur tachycardia with frequent ectopics Abdomen:  Bowel sounds positive,abdomen soft and non-tender without masses, organomegaly or hernias noted. Rectal:  heme-negative stool and light brown Msk:  there is a smaller hematoma with ecchymoses over her left hip area.  There is also some ecchymoses that were fairly extensive, involving her left medial thigh   Impression & Recommendations:  Problem # 1:  ANEMIA (ICD-285.9) will hold her Coumadin again today and resume tomorrow.  Will recheck in two days with follow-up hemoglobin and hematocrit.  Will place on iron supplementation.  Lead that anemia is due to blood loss both postop and also bleeding into her soft tissues  Problem # 2:  CORONARY ARTERY DISEASE (ICD-414.00)  The following medications were  removed from the medication list:    Hydrochlorothiazide 12.5 Mg Caps (Hydrochlorothiazide) .Marland Kitchen... Take one tab by mouth once daily Her updated medication list for  this problem includes:    Verapamil Hcl Cr 240 Mg Tbcr (Verapamil hcl) .Marland Kitchen... 1 once daily    Avapro 300 Mg Tabs (Irbesartan) ..... Qd  Problem # 3:  HYPERTENSION (ICD-401.9)  The following medications were removed from the medication list:    Hydrochlorothiazide 12.5 Mg Caps (Hydrochlorothiazide) .Marland Kitchen... Take one tab by mouth once daily Her updated medication list for this problem includes:    Verapamil Hcl Cr 240 Mg Tbcr (Verapamil hcl) .Marland Kitchen... 1 once daily    Avapro 300 Mg Tabs (Irbesartan) ..... Qd will hold Avapro and recheck blood pressure in 48 hours  Complete Medication List: 1)  Budeprion Sr 150 Mg Tb12 (Bupropion hcl) .Marland Kitchen.. 1 qd 2)  Lipitor 20 Mg Tabs (Atorvastatin calcium) .Marland Kitchen.. 1 once daily 3)  Synthroid 100 Mcg Tabs (Levothyroxine sodium) .Marland Kitchen.. 1 once daily 4)  Trazodone Hcl 100 Mg Tabs (Trazodone hcl) .Marland Kitchen.. 1 at bedtime 5)  Verapamil Hcl Cr 240 Mg Tbcr (Verapamil hcl) .Marland Kitchen.. 1 once daily 6)  Warfarin Sodium 5 Mg Tabs (Warfarin sodium) .... As dir 7)  Vicodin 5-500 Mg Tabs (Hydrocodone-acetaminophen) .Marland Kitchen.. 1 q6h as needed 8)  Avapro 300 Mg Tabs (Irbesartan) .... Qd  Patient Instructions: 1)  supplemental  iron 3 times daily 2)  hold Coumadin today and resume tomorrow 3)  office visit in two days 4)  call if you develops any worsening weakness or dizziness 5)  hold Avapro

## 2010-03-24 NOTE — Assessment & Plan Note (Signed)
Summary: medical clearance/cjr   Vital Signs:  Ferguson profile:   70 year old female Height:      62.25 inches Weight:      123 pounds BMI:     22.40 Temp:     98.1 degrees F 140rectal BP sitting:   140 / 88  (left arm) Cuff size:   regular  Vitals Entered By: Duard Brady LPN (April 27, 1608 12:58 PM) CC: medical clearence for (R) shoulder surg.  Is Ferguson Diabetic? No   CC:  medical clearence for (R) shoulder surg. Marland Kitchen  History of Present Illness: Paula Ferguson who is seen today for follow-up and medical clearance prior to elective right shoulder surgery.  She has a history of coronary artery disease and is status post aortic valve replacement.  She is on chronic Coumadin therapy and will require bridge therapy with Lovenox prior to her surgery.  This is managed by cardiology.  She has treated hypertension, dyslipidemia, hypothyroidism.  Her cardiac status has been stable.  She denies any exertional chest pain or shortness of breath.  Her INR was therapeutic.  Last week  Preventive Screening-Counseling & Management  Alcohol-Tobacco     Smoking Status: never  Allergies: 1)  Demerol (Meperidine Hcl) 2)  Dilaudid 3)  Morphine Sulfate (Morphine Sulfate)  Past History:  Past Medical History: Reviewed history from 08/26/2007 and no changes required. Hypertension Hypothyroidism Hyperlipidemia status post mechanical aortic valve repair history of anxiety, depression Osteoarthritis Coronary artery disease hyperparathyroidism  Review of Systems  The Ferguson denies anorexia, fever, weight loss, weight gain, vision loss, decreased hearing, hoarseness, chest pain, syncope, dyspnea on exertion, peripheral edema, prolonged cough, headaches, hemoptysis, abdominal pain, melena, hematochezia, severe indigestion/heartburn, hematuria, incontinence, genital sores, muscle weakness, suspicious skin lesions, transient blindness, difficulty walking, depression, unusual weight  change, abnormal bleeding, enlarged lymph nodes, angioedema, and breast masses.    Physical Exam  General:  Well-developed,well-nourished,in no acute distress; alert,appropriate and cooperative throughout examination; 134/80 Head:  Normocephalic and atraumatic without obvious abnormalities. No apparent alopecia or balding. Eyes:  No corneal or conjunctival inflammation noted. EOMI. Perrla. Funduscopic exam benign, without hemorrhages, exudates or papilledema. Vision grossly normal. Mouth:  Oral mucosa and oropharynx without lesions or exudates.  Teeth in good repair. Neck:  No deformities, masses, or tenderness noted. Lungs:  Normal respiratory effort, chest expands symmetrically. Lungs are clear to auscultation, no crackles or wheezes. Heart:  prosthetic heart sounds.  No murmur Abdomen:  Bowel sounds positive,abdomen soft and non-tender without masses, organomegaly or hernias noted. Msk:  osteoarthritic changes Extremities:  no edema Skin:  Intact without suspicious lesions or rashes Cervical Nodes:  No lymphadenopathy noted Psych:  Cognition and judgment appear intact. Alert and cooperative with normal attention span and concentration. No apparent delusions, illusions, hallucinations   Impression & Recommendations:  Problem # 1:  CORONARY ARTERY DISEASE (ICD-414.00)  The following medications were removed from the medication list:    Hydrochlorothiazide 12.5 Mg Tabs (Hydrochlorothiazide) ..... One daily Her updated medication list for this problem includes:    Avalide 300-12.5 Mg Tabs (Irbesartan-hydrochlorothiazide) .Marland Kitchen... Take 1 tablet by mouth every morning    Verapamil Hcl Cr 240 Mg Tbcr (Verapamil hcl) .Marland Kitchen... 1 once daily  Problem # 2:  OSTEOARTHRITIS (ICD-715.90)  Her updated medication list for this problem includes:    Vicodin 5-500 Mg Tabs (Hydrocodone-acetaminophen) .Marland Kitchen... 1 q6h as needed  Problem # 3:  HYPERTENSION (ICD-401.9)  The following medications were removed  from the medication list:  Hydrochlorothiazide 12.5 Mg Tabs (Hydrochlorothiazide) ..... One daily Her updated medication list for this problem includes:    Avalide 300-12.5 Mg Tabs (Irbesartan-hydrochlorothiazide) .Marland Kitchen... Take 1 tablet by mouth every morning    Verapamil Hcl Cr 240 Mg Tbcr (Verapamil hcl) .Marland Kitchen... 1 once daily  Problem # 4:  HYPOTHYROIDISM (ICD-244.9)  Her updated medication list for this problem includes:    Synthroid 100 Mcg Tabs (Levothyroxine sodium) .Marland Kitchen... 1 once daily  Complete Medication List: 1)  Avalide 300-12.5 Mg Tabs (Irbesartan-hydrochlorothiazide) .... Take 1 tablet by mouth every morning 2)  Budeprion Sr 150 Mg Tb12 (Bupropion hcl) .Marland Kitchen.. 1 qd 3)  Lipitor 20 Mg Tabs (Atorvastatin calcium) .Marland Kitchen.. 1 once daily 4)  Synthroid 100 Mcg Tabs (Levothyroxine sodium) .Marland Kitchen.. 1 once daily 5)  Trazodone Hcl 100 Mg Tabs (Trazodone hcl) .Marland Kitchen.. 1 at bedtime 6)  Verapamil Hcl Cr 240 Mg Tbcr (Verapamil hcl) .Marland Kitchen.. 1 once daily 7)  Warfarin Sodium 5 Mg Tabs (Warfarin sodium) .... As dir 8)  Vicodin 5-500 Mg Tabs (Hydrocodone-acetaminophen) .Marland Kitchen.. 1 q6h as needed 9)  Amoxicillin 500 Mg Caps (Amoxicillin) .... Take 4 prior to each dental procedure (30-60  minutes prior)  Ferguson Instructions: 1)  Please schedule a follow-up appointment in 6 months. 2)  Limit your Sodium (Salt). 3)  cardiology follow-up as scheduled Prescriptions: AMOXICILLIN 500 MG  CAPS (AMOXICILLIN) Take 4 prior to each dental procedure (30-60  minutes prior)  ##12 x 0   Entered and Authorized by:   Gordy Savers  MD   Signed by:   Gordy Savers  MD on 04/27/2009   Method used:   Print then Give to Ferguson   RxID:   [id number] VICODIN 5-500 MG  TABS (HYDROCODONE-ACETAMINOPHEN) 1 q6h as needed  #90 x 4   Entered and Authorized by:   Gordy Savers  MD   Signed by:   Gordy Savers  MD on 04/27/2009   Method used:   Print then Give to Ferguson   RxID:   [id number] WARFARIN SODIUM 5 MG  TABS (WARFARIN SODIUM) as dir  #50 Tablet x 3   Entered and Authorized by:   Gordy Savers  MD   Signed by:   Gordy Savers  MD on 04/27/2009   Method used:   Print then Give to Ferguson   RxID:   [id number] VERAPAMIL HCL CR 240 MG TBCR (VERAPAMIL HCL) 1 once daily  #90 Tablet x 4   Entered and Authorized by:   Gordy Savers  MD   Signed by:   Gordy Savers  MD on 04/27/2009   Method used:   Print then Give to Ferguson   RxID:   [id number] TRAZODONE HCL 100 MG TABS (TRAZODONE HCL) 1 at bedtime  #90 x 6   Entered and Authorized by:   Gordy Savers  MD   Signed by:   Gordy Savers  MD on 04/27/2009   Method used:   Print then Give to Ferguson   RxID:   [id number] SYNTHROID 100 MCG TABS (LEVOTHYROXINE SODIUM) 1 once daily  #90 Tablet x 4   Entered and Authorized by:   Gordy Savers  MD   Signed by:   Gordy Savers  MD on 04/27/2009   Method used:   Print then Give to Ferguson   RxID:   [id number] LIPITOR 20 MG TABS (ATORVASTATIN CALCIUM) 1 once daily  #90 x 8   Entered and Authorized by:  Gordy Savers  MD   Signed by:   Gordy Savers  MD on 04/27/2009   Method used:   Print then Give to Ferguson   RxID:   [id number] BUDEPRION SR 150 MG TB12 (BUPROPION HCL) 1 qd  #90 x 5   Entered and Authorized by:   Gordy Savers  MD   Signed by:   Gordy Savers  MD on 04/27/2009   Method used:   Print then Give to Ferguson   RxID:   [id number] AVALIDE 300-12.5 MG TABS (IRBESARTAN-HYDROCHLOROTHIAZIDE) Take 1 tablet by mouth every morning  #90 x 4   Entered and Authorized by:   Gordy Savers  MD   Signed by:   Gordy Savers  MD on 04/27/2009   Method used:   Print then Give to Ferguson   RxID:   [id number]

## 2010-03-24 NOTE — Progress Notes (Signed)
Summary: Pt called in distress. Bad reaction to cortizone inj. Pls call   Call back at Home Phone [phone]   Caller: Patient Summary of Call: Pt called and said that she had a cortizone inj by Dr. Patsy Lager on 09/08/09 and now there is severe brusing, a lump where inj was administered and pain like bee stings around the area. Pt also said that her hemoglobin lvl is 8.  Pt in distress.    Initial call taken by: Lucy Antigua,  September 15, 2009 8:49 AM  Follow-up for Phone Call        Scheduled work in appt with Dr. Kirtland Bouchard to address anemia.........8 gm hgb. Follow-up by: Lynann Beaver CMA,  September 15, 2009 9:02 AM

## 2010-03-24 NOTE — Assessment & Plan Note (Signed)
Summary: COUGH AT NIGHT/OK PER KIM/NJR/pt rsc/cjr   Vital Signs:  Ferguson profile:   70 year old female Weight:      124 pounds Temp:     98.4 degrees F oral BP sitting:   120 / 80  (right arm) Cuff size:   regular  Vitals Entered By: Duard Brady LPN (March 08, 2010 11:14 AM) CC: still with cough - nonproductive   Is Ferguson Diabetic? No   CC:  still with cough - nonproductive  .  History of Present Illness: Paula Ferguson who is seen today for follow-up.  She has a history of dyslipidemia, coronary artery disease, status post aortic valve repair.  She is on chronic warfarin anticoagulation.  She has a history of osteoarthritis and remains quite symptomatic, especially knee and shoulder areas.  Her lung, involving her left lower extremity has nicely healed.  Allergies: 1)  Demerol (Meperidine Hcl) 2)  Dilaudid 3)  Morphine Sulfate (Morphine Sulfate)  Past History:  Past Medical History: Reviewed history from 09/17/2009 and no changes required. Hypertension Hypothyroidism Hyperlipidemia status post mechanical aortic valve repair history of anxiety, depression Osteoarthritis Coronary artery disease hyperparathyroidism postoperative anemia  Past Surgical History: Reviewed history from 08/06/2009 and no changes required. Cholecystectomy laparoscopic 1999 Hysterectomy Multiple knee, ankle and wrist sx colonoscopy in 2000  multiple orthopedic procedures status post CABG and aortic valve repair 2003 R Shoulder surgery (Rotator cuff repair)  right total shoulder replacement surgery April 2011  Review of Systems       The Ferguson complains of difficulty walking.  The Ferguson denies anorexia, fever, weight loss, weight gain, vision loss, decreased hearing, hoarseness, chest pain, syncope, dyspnea on exertion, peripheral edema, prolonged cough, headaches, hemoptysis, abdominal pain, melena, hematochezia, severe indigestion/heartburn, hematuria, incontinence,  genital sores, muscle weakness, suspicious skin lesions, transient blindness, depression, unusual weight change, abnormal bleeding, enlarged lymph nodes, angioedema, and breast masses.    Physical Exam  General:  Well-developed,well-nourished,in no acute distress; alert,appropriate and cooperative throughout examination Head:  Normocephalic and atraumatic without obvious abnormalities. No apparent alopecia or balding. Mouth:  Oral mucosa and oropharynx without lesions or exudates.  Teeth in good repair. Neck:  No deformities, masses, or tenderness noted. Lungs:  Normal respiratory effort, chest expands symmetrically. Lungs are clear to auscultation, no crackles or wheezes. Heart:  Normal rate and regular rhythm. S1 and S2 normal without gallop, murmur, click, rub or other extra sounds. Abdomen:  Bowel sounds positive,abdomen soft and non-tender without masses, organomegaly or hernias noted. Msk:  No deformity or scoliosis noted of thoracic or lumbar spine.   Pulses:  R and L carotid,radial,femoral,dorsalis pedis and posterior tibial pulses are full and equal bilaterally Extremities:  No clubbing, cyanosis, edema, or deformity noted with normal full range of motion of all joints.     Impression & Recommendations:  Problem # 1:  COUMADIN THERAPY (ICD-V58.61)  Problem # 2:  CORONARY ARTERY DISEASE (ICD-414.00)  Her updated medication list for this problem includes:    Verapamil Hcl Cr 240 Mg Tbcr (Verapamil hcl) .Marland Kitchen... 1 once daily    Losartan Potassium 100 Mg Tabs (Losartan potassium) ..... One daily  Problem # 3:  HYPERLIPIDEMIA (ICD-272.4)  Her updated medication list for this problem includes:    Lipitor 20 Mg Tabs (Atorvastatin calcium) .Marland Kitchen... 1 once daily  Problem # 4:  HYPERTENSION (ICD-401.9)  Her updated medication list for this problem includes:    Verapamil Hcl Cr 240 Mg Tbcr (Verapamil hcl) .Marland Kitchen... 1 once daily  Losartan Potassium 100 Mg Tabs (Losartan potassium) ..... One  daily  Complete Medication List: 1)  Budeprion Sr 150 Mg Tb12 (Bupropion hcl) .Marland Kitchen.. 1 qd 2)  Lipitor 20 Mg Tabs (Atorvastatin calcium) .Marland Kitchen.. 1 once daily 3)  Synthroid 100 Mcg Tabs (Levothyroxine sodium) .Marland Kitchen.. 1 once daily 4)  Trazodone Hcl 100 Mg Tabs (Trazodone hcl) .Marland Kitchen.. 1 at bedtime 5)  Verapamil Hcl Cr 240 Mg Tbcr (Verapamil hcl) .Marland Kitchen.. 1 once daily 6)  Warfarin Sodium 5 Mg Tabs (Warfarin sodium) .... As dir 7)  Vicodin 5-500 Mg Tabs (Hydrocodone-acetaminophen) .Marland Kitchen.. 1 q6h as needed 8)  Ferrous Sulfate 325 (65 Fe) Mg Tabs (Ferrous sulfate) .... Qd 9)  Losartan Potassium 100 Mg Tabs (Losartan potassium) .... One daily  Ferguson Instructions: 1)  Please schedule a follow-up appointment in 6 months. 2)  Limit your Sodium (Salt). 3)  It is important that you exercise regularly at least 20 minutes 5 times a week. If you develop chest pain, have severe difficulty breathing, or feel very tired , stop exercising immediately and seek medical attention. Prescriptions: VICODIN 5-500 MG  TABS (HYDROCODONE-ACETAMINOPHEN) 1 q6h as needed  #90 x 2   Entered and Authorized by:   Gordy Savers  MD   Signed by:   Gordy Savers  MD on 03/08/2010   Method used:   Print then Give to Ferguson   RxID:   [id number] WARFARIN SODIUM 5 MG TABS (WARFARIN SODIUM) as dir  #50 Tablet x 2   Entered and Authorized by:   Gordy Savers  MD   Signed by:   Gordy Savers  MD on 03/08/2010   Method used:   Print then Give to Ferguson   RxID:   [id number] VERAPAMIL HCL CR 240 MG TBCR (VERAPAMIL HCL) 1 once daily  #90 Tablet x 4   Entered and Authorized by:   Gordy Savers  MD   Signed by:   Gordy Savers  MD on 03/08/2010   Method used:   Print then Give to Ferguson   RxID:   [id number] TRAZODONE HCL 100 MG TABS (TRAZODONE HCL) 1 at bedtime  #9-0 x 4   Entered and Authorized by:   Gordy Savers  MD   Signed by:   Gordy Savers  MD on 03/08/2010   Method used:    Print then Give to Ferguson   RxID:   [id number] SYNTHROID 100 MCG TABS (LEVOTHYROXINE SODIUM) 1 once daily  #90 Tablet x 4   Entered and Authorized by:   Gordy Savers  MD   Signed by:   Gordy Savers  MD on 03/08/2010   Method used:   Print then Give to Ferguson   RxID:   [id number] LIPITOR 20 MG TABS (ATORVASTATIN CALCIUM) 1 once daily  #90 x 8   Entered and Authorized by:   Gordy Savers  MD   Signed by:   Gordy Savers  MD on 03/08/2010   Method used:   Print then Give to Ferguson   RxID:   [id number] BUDEPRION SR 150 MG TB12 (BUPROPION HCL) 1 qd  #90 Tablet x 3   Entered and Authorized by:   Gordy Savers  MD   Signed by:   Gordy Savers  MD on 03/08/2010   Method used:   Print then Give to Ferguson   RxID:   [id number]    Orders Added: 1)  Est. Ferguson Level IV [62831]

## 2010-03-24 NOTE — Assessment & Plan Note (Signed)
Summary: ear pain/dm   Vital Signs:  Patient profile:   70 year old female Weight:      122 pounds BP sitting:   100 / 70  (left arm) Cuff size:   regular  Vitals Entered By: Duard Brady LPN (August 06, 2009 11:47 AM) CC: c/o (R) ear pain , swelling Is Patient Diabetic? No   CC:  c/o (R) ear pain  and swelling.  History of Present Illness: 70 year old patient who is seen today for follow-up.  For the past couple days.  She has had some mild sore throat, and right ear discomfort.  The discomfort seems asked to localize to the right postauricular area for the past day or so.  Her symptoms have improved.  She is recovering from a right shoulder surgery in April she is on chronic Coumadin due to aortic valve replacement surgery.  She has treated hypertension.  She has dyslipidemia and hypothyroidism  Allergies: 1)  Demerol (Meperidine Hcl) 2)  Dilaudid 3)  Morphine Sulfate (Morphine Sulfate)  Past History:  Past Medical History: Reviewed history from 08/26/2007 and no changes required. Hypertension Hypothyroidism Hyperlipidemia status post mechanical aortic valve repair history of anxiety, depression Osteoarthritis Coronary artery disease hyperparathyroidism  Past Surgical History: Cholecystectomy laparoscopic 1999 Hysterectomy Multiple knee, ankle and wrist sx colonoscopy in 2000  multiple orthopedic procedures status post CABG and aortic valve repair 2003 R Shoulder surgery (Rotator cuff repair)  right total shoulder replacement surgery April 2011  Review of Systems  The patient denies anorexia, fever, weight loss, weight gain, vision loss, decreased hearing, hoarseness, chest pain, syncope, dyspnea on exertion, peripheral edema, prolonged cough, headaches, hemoptysis, abdominal pain, melena, hematochezia, severe indigestion/heartburn, hematuria, incontinence, genital sores, muscle weakness, suspicious skin lesions, transient blindness, difficulty walking,  depression, unusual weight change, abnormal bleeding, enlarged lymph nodes, angioedema, and breast masses.    Physical Exam  General:  Well-developed,well-nourished,in no acute distress; alert,appropriate and cooperative throughout examination; 110/70 Head:  Normocephalic and atraumatic without obvious abnormalities. No apparent alopecia or balding. Eyes:  No corneal or conjunctival inflammation noted. EOMI. Perrla. Funduscopic exam benign, without hemorrhages, exudates or papilledema. Vision grossly normal. Ears:  cerumen in both canals, but no inflammatory changes Mouth:  Oral mucosa and oropharynx without lesions or exudates.  Teeth in good repair. Neck:  No deformities, masses, or tenderness noted. Chest Wall:  No deformities, masses, or tenderness noted. Lungs:  Normal respiratory effort, chest expands symmetrically. Lungs are clear to auscultation, no crackles or wheezes. Heart:  prosthetic heart sounds Abdomen:  Bowel sounds positive,abdomen soft and non-tender without masses, organomegaly or hernias noted. Msk:  status post right shoulder replacement surgery; residual ecchymoses down the right arm Pulses:  R and L carotid,radial,femoral,dorsalis pedis and posterior tibial pulses are full and equal bilaterally Extremities:  No clubbing, cyanosis, edema, or deformity noted with normal full range of motion of all joints.     Impression & Recommendations:  Problem # 1:  HYPOTHYROIDISM (ICD-244.9)  Her updated medication list for this problem includes:    Synthroid 100 Mcg Tabs (Levothyroxine sodium) .Marland Kitchen... 1 once daily  Problem # 2:  HYPERTENSION (ICD-401.9)  Her updated medication list for this problem includes:    Verapamil Hcl Cr 240 Mg Tbcr (Verapamil hcl) .Marland Kitchen... 1 once daily    Avapro 300 Mg Tabs (Irbesartan) ..... Qd  Problem # 3:  EAR PAIN, RIGHT (ICD-388.70)  The following medications were removed from the medication list:    Amoxicillin 500 Mg Caps (Amoxicillin) .Marland KitchenMarland KitchenMarland KitchenMarland Kitchen  Take 4 prior to each dental procedure (30-60  minutes prior) exam is unremarkable.  Clinically observe at this time  Problem # 4:  HYPERLIPIDEMIA (ICD-272.4)  Her updated medication list for this problem includes:    Lipitor 20 Mg Tabs (Atorvastatin calcium) .Marland Kitchen... 1 once daily  Complete Medication List: 1)  Budeprion Sr 150 Mg Tb12 (Bupropion hcl) .Marland Kitchen.. 1 qd 2)  Lipitor 20 Mg Tabs (Atorvastatin calcium) .Marland Kitchen.. 1 once daily 3)  Synthroid 100 Mcg Tabs (Levothyroxine sodium) .Marland Kitchen.. 1 once daily 4)  Trazodone Hcl 100 Mg Tabs (Trazodone hcl) .Marland Kitchen.. 1 at bedtime 5)  Verapamil Hcl Cr 240 Mg Tbcr (Verapamil hcl) .Marland Kitchen.. 1 once daily 6)  Warfarin Sodium 5 Mg Tabs (Warfarin sodium) .... As dir 7)  Vicodin 5-500 Mg Tabs (Hydrocodone-acetaminophen) .Marland Kitchen.. 1 q6h as needed 8)  Avapro 300 Mg Tabs (Irbesartan) .... Qd  Patient Instructions: 1)  Please schedule a follow-up appointment in 6 months. 2)  Limit your Sodium (Salt) to less than 2 grams a day(slightly less than 1/2 a teaspoon) to prevent fluid retention, swelling, or worsening of symptoms. 3)  It is important that you exercise regularly at least 20 minutes 5 times a week. If you develop chest pain, have severe difficulty breathing, or feel very tired , stop exercising immediately and seek medical attention.

## 2010-03-24 NOTE — Progress Notes (Signed)
Summary: infected ear?  Phone Note Call from Patient Call back at Home Phone [phone]   Caller: Patient---live call Reason for Call: Talk to Nurse Summary of Call: wants to see Dr Kirtland Bouchard today. Has something wrong with the back of her ear. maybe a bug bite. affecting the inside of ear. Initial call taken by: Warnell Forester,  August 05, 2009 10:54 AM  Follow-up for Phone Call        pt cannot get in today.  Scheduled appt tomorrow with Dr. Kirtland Bouchard. Follow-up by: Lynann Beaver CMA,  August 05, 2009 11:00 AM  Additional Follow-up for Phone Call Additional follow up Details #1::        okay Additional Follow-up by: Gordy Savers  MD,  August 05, 2009 12:59 PM

## 2010-03-24 NOTE — Progress Notes (Signed)
Summary: avapro rx called in  Phone Note Call from Patient Call back at Home Phone [phone]   Caller: Patient---live call Summary of Call: Was on Avalide. Has been discontinued. Was given samples Avapro 300mg  1d. Pt is requesting a new rx to be called in to [facility] on [address] ----[phone] Initial call taken by: Warnell Forester,  May 21, 2009 11:19 AM  Follow-up for Phone Call        avapro 300  #90 Follow-up by: Gordy Savers  MD,  May 21, 2009 12:52 PM    New/Updated Medications: AVAPRO 300 MG TABS (IRBESARTAN) qd Prescriptions: AVAPRO 300 MG TABS (IRBESARTAN) qd  #90 x 3   Entered by:   Duard Brady LPN   Authorized by:   Gordy Savers  MD   Signed by:   Duard Brady LPN on 84/16/6063   Method used:   Electronically to        [facility]  [address] [id number] (retail)       [address]       [place], [place]  [address]       Ph: [phone] or [phone]       Fax: [phone]   RxID:   [id number]

## 2010-03-24 NOTE — Progress Notes (Signed)
Summary: flonase and alavert  Phone Note Call from Patient   Caller: Patient Call For: Paula Savers  MD Summary of Call: [facility] Pt is still having sinus complaints.......cheek pain, eye pain. severe congestion.  Requests RX. [phone] Initial call taken by: Lynann Beaver CMA,  May 03, 2009 10:44 AM  Follow-up for Phone Call        spoke with p t- instructed to take alavert once daily and flonase nasal spray once daily - rx for nasal spray to [facility] , if no improvement in 2-3 days or fever - to be seen .  Surgery scheduled for 06/18/09.  KIK Follow-up by: Duard Brady LPN,  May 03, 2009 3:43 PM    New/Updated Medications: FLONASE 50 MCG/ACT SUSP (FLUTICASONE PROPIONATE) once daily ALAVERT 10 MG TABS (LORATADINE) once daily Prescriptions: FLONASE 50 MCG/ACT SUSP (FLUTICASONE PROPIONATE) once daily  #1 x 1   Entered by:   Duard Brady LPN   Authorized by:   Paula Savers  MD   Signed by:   Duard Brady LPN on 62/13/0865   Method used:   [facility]ically to        [facility]  [address] [id number] ([facility]       [address]       [place], [place]  [address]       Ph: [phone] or [phone]       Fax: [phone]   RxID:   [id number]  generic flonase   use daily; otc allavert one daily

## 2010-03-24 NOTE — Miscellaneous (Signed)
Summary: Controlled Substances Contract  Controlled Substances Contract   Imported By: Maryln Gottron 09/17/2009 15:52:32  _____________________________________________________________________  External Attachment:    Type:   Image     Comment:   External Document

## 2010-03-24 NOTE — Progress Notes (Signed)
Summary: refill hydrocodone apap  Phone Note Refill Request Message from:  Fax from Pharmacy on January 17, 2010 3:00 PM  Refills Requested: Medication #1:  VICODIN 5-500 MG  TABS 1 q6h as needed   Last Refilled: 10/15/[facility] [address]    Method Requested: Fax to Local Pharmacy Initial call taken by: Duard Brady LPN,  January 17, 2010 3:00 PM  Follow-up for Phone Call        #90 Follow-up by: Gordy Savers  MD,  January 17, 2010 5:28 PM    Prescriptions: VICODIN 5-500 MG  TABS (HYDROCODONE-ACETAMINOPHEN) 1 q6h as needed  #90 x 0   Entered by:   Duard Brady LPN   Authorized by:   Gordy Savers  MD   Signed by:   Duard Brady LPN on 04/54/0981   Method used:   Historical   RxID:   [id number]  faxed back to [facility]

## 2010-03-24 NOTE — Assessment & Plan Note (Signed)
Summary: 2 day fup/njr   Vital Signs:  Patient profile:   70 year old female Weight:      125 pounds Temp:     98.0 degrees F oral BP sitting:   130 / 70  (left arm) Cuff size:   regular  Vitals Entered By: Duard Brady LPN (September 17, 2009 12:54 PM) CC: 2 day f/u - much better Is Patient Diabetic? No   CC:  2 day f/u - much better.  History of Present Illness: 70 year old patient who is seen today for follow-up of her postop anemia.  She's also had some bleeding into the soft tissues after a IM injection associate with mild Coumadin coagulopathy.  She was seen two days ago and and hemoglobin was 8.2  3 days ago.  She has been on iron and today she feels much improved.  Her Avapro has been placed on hold.  Allergies: 1)  Demerol (Meperidine Hcl) 2)  Dilaudid 3)  Morphine Sulfate (Morphine Sulfate)  Past History:  Past Medical History: Hypertension Hypothyroidism Hyperlipidemia status post mechanical aortic valve repair history of anxiety, depression Osteoarthritis Coronary artery disease hyperparathyroidism postoperative anemia  Physical Exam  General:  Well-developed,well-nourished,in no acute distress; alert,appropriate and cooperative throughout examination; blood pressure 110/70 Neck:  No deformities, masses, or tenderness noted. Lungs:  Normal respiratory effort, chest expands symmetrically. Lungs are clear to auscultation, no crackles or wheezes. Heart:  Normal rate and regular rhythm. S1 and S2 normal without gallop, murmur, click, rub or other extra sounds.  no tachycardia Skin:  resolving ecchymoses on extremities   Impression & Recommendations:  Problem # 1:  ANEMIA (ICD-285.9)  Her updated medication list for this problem includes:    Ferrous Sulfate 325 (65 Fe) Mg Tabs (Ferrous sulfate) ..... Qd  Her updated medication list for this problem includes:    Ferrous Sulfate 325 (65 Fe) Mg Tabs (Ferrous sulfate) ..... Qd  Orders: Venipuncture  (16109) TLB-CBC Platelet - w/Differential (85025-CBCD)  Problem # 2:  HYPERTENSION (ICD-401.9)  Her updated medication list for this problem includes:    Verapamil Hcl Cr 240 Mg Tbcr (Verapamil hcl) .Marland Kitchen... 1 once daily    Avapro 300 Mg Tabs (Irbesartan) ..... Qd will continue to hold Avapro until anemia is improved  Her updated medication list for this problem includes:    Verapamil Hcl Cr 240 Mg Tbcr (Verapamil hcl) .Marland Kitchen... 1 once daily    Avapro 300 Mg Tabs (Irbesartan) ..... Qd  Complete Medication List: 1)  Budeprion Sr 150 Mg Tb12 (Bupropion hcl) .Marland Kitchen.. 1 qd 2)  Lipitor 20 Mg Tabs (Atorvastatin calcium) .Marland Kitchen.. 1 once daily 3)  Synthroid 100 Mcg Tabs (Levothyroxine sodium) .Marland Kitchen.. 1 once daily 4)  Trazodone Hcl 100 Mg Tabs (Trazodone hcl) .Marland Kitchen.. 1 at bedtime 5)  Verapamil Hcl Cr 240 Mg Tbcr (Verapamil hcl) .Marland Kitchen.. 1 once daily 6)  Warfarin Sodium 5 Mg Tabs (Warfarin sodium) .... As dir 7)  Vicodin 5-500 Mg Tabs (Hydrocodone-acetaminophen) .Marland Kitchen.. 1 q6h as needed 8)  Avapro 300 Mg Tabs (Irbesartan) .... Qd 9)  Ferrous Sulfate 325 (65 Fe) Mg Tabs (Ferrous sulfate) .... Qd  Other Orders: Protime ([id number])  Patient Instructions: 1)  Please schedule a follow-up appointment in 1 month. 2)  take iron supplements as directed 3)  hold Avapro  Appended Document: 2 day fup/njr   ANTICOAGULATION RECORD  NEW REGIMEN & LAB RESULTS Current INR: 1.4 Regimen: same  Repeat testing in: 2 weeks  Anticoagulation Visit Questionnaire Coumadin dose missed/changed:  No Abnormal  Bleeding Symptoms:  No  Any diet changes including alcohol intake, vegetables or greens since the last visit:  No Any illnesses or hospitalizations since the last visit:  No Any signs of clotting since the last visit (including chest discomfort, dizziness, shortness of breath, arm tingling, slurred speech, swelling or redness in leg):  Yes  MEDICATIONS BUDEPRION SR 150 MG TB12 (BUPROPION HCL) 1 qd LIPITOR 20 MG TABS  (ATORVASTATIN CALCIUM) 1 once daily SYNTHROID 100 MCG TABS (LEVOTHYROXINE SODIUM) 1 once daily TRAZODONE HCL 100 MG TABS (TRAZODONE HCL) 1 at bedtime VERAPAMIL HCL CR 240 MG TBCR (VERAPAMIL HCL) 1 once daily WARFARIN SODIUM 5 MG TABS (WARFARIN SODIUM) as dir VICODIN 5-500 MG  TABS (HYDROCODONE-ACETAMINOPHEN) 1 q6h as needed AVAPRO 300 MG TABS (IRBESARTAN) qd FERROUS SULFATE 325 (65 FE) MG TABS (FERROUS SULFATE) qd    Laboratory Results   Blood Tests      INR: 1.4   (Normal Range: 0.88-1.12   Therap INR: 2.0-3.5) Comments: Rita Ohara  September 17, 2009 1:35 PM

## 2010-04-18 ENCOUNTER — Other Ambulatory Visit: Payer: Self-pay | Admitting: Internal Medicine

## 2010-04-27 ENCOUNTER — Encounter (HOSPITAL_COMMUNITY)
Admission: RE | Admit: 2010-04-27 | Discharge: 2010-04-27 | Disposition: A | Discharge status: Home / Self Care | Payer: Medicare Other | Source: Ambulatory Visit | Attending: Orthopedic Surgery | Admitting: Orthopedic Surgery

## 2010-04-27 LAB — URINALYSIS, ROUTINE W REFLEX MICROSCOPIC
Bilirubin Urine: NEGATIVE
Glucose, UA: NEGATIVE mg/dL
Nitrite: NEGATIVE
Specific Gravity, Urine: 1.028 (ref 1.005–1.030)

## 2010-04-27 LAB — BASIC METABOLIC PANEL
CO2: 26 mEq/L (ref 19–32)
Calcium: 9.3 mg/dL (ref 8.4–10.5)
Chloride: 105 mEq/L (ref 96–112)
Creatinine, Ser: 0.7 mg/dL (ref 0.4–1.2)
GFR calc Af Amer: >60 mL/min (ref >60)
Glucose, Bld: 84 mg/dL (ref 70–99)

## 2010-04-27 LAB — DIFFERENTIAL
Basophils Relative: 1 % (ref 0–1)
Eosinophils Relative: 2 % (ref 0–5)
Lymphocytes Relative: 26 % (ref 12–46)
Lymphs Abs: 1.1 10*3/uL (ref 0.7–4.0)
Monocytes Absolute: 0.5 10*3/uL (ref 0.1–1.0)
Neutro Abs: 2.5 10*3/uL (ref 1.7–7.7)
Neutrophils Relative %: 60 % (ref 43–77)

## 2010-04-27 LAB — CBC
MCH: 30.6 pg (ref 26.0–34.0)
MCV: 90.7 fL (ref 78.0–100.0)
Platelets: 173 10*3/uL (ref 150–400)

## 2010-04-27 LAB — PROTIME-INR: INR: 1.54 — ABNORMAL HIGH (ref 0.00–1.49)

## 2010-04-27 LAB — APTT: aPTT: 41 seconds — ABNORMAL HIGH (ref 24–37)

## 2010-04-29 ENCOUNTER — Inpatient Hospital Stay (HOSPITAL_COMMUNITY)
Admission: RE | Admit: 2010-04-29 | Discharge: 2010-05-02 | DRG: 516 | Disposition: A | Discharge status: Home Health | Payer: Medicare Other | Source: Ambulatory Visit | Attending: Orthopedic Surgery | Admitting: Orthopedic Surgery

## 2010-04-29 ENCOUNTER — Inpatient Hospital Stay (HOSPITAL_COMMUNITY): Payer: Medicare Other

## 2010-04-29 DIAGNOSIS — T84099A Other mechanical complication of unspecified internal joint prosthesis, initial encounter: Principal | ICD-10-CM | POA: Diagnosis present

## 2010-04-29 DIAGNOSIS — Z954 Presence of other heart-valve replacement: Secondary | ICD-10-CM

## 2010-04-29 DIAGNOSIS — I10 Essential (primary) hypertension: Secondary | ICD-10-CM | POA: Diagnosis present

## 2010-04-29 DIAGNOSIS — M19049 Primary osteoarthritis, unspecified hand: Secondary | ICD-10-CM | POA: Diagnosis present

## 2010-04-29 DIAGNOSIS — Y831 Surgical operation with implant of artificial internal device as the cause of abnormal reaction of the patient, or of later complication, without mention of misadventure at the time of the procedure: Secondary | ICD-10-CM | POA: Diagnosis present

## 2010-04-29 DIAGNOSIS — I2510 Atherosclerotic heart disease of native coronary artery without angina pectoris: Secondary | ICD-10-CM | POA: Diagnosis present

## 2010-04-29 DIAGNOSIS — E039 Hypothyroidism, unspecified: Secondary | ICD-10-CM | POA: Diagnosis present

## 2010-04-29 DIAGNOSIS — Z96619 Presence of unspecified artificial shoulder joint: Secondary | ICD-10-CM

## 2010-04-29 DIAGNOSIS — Y92009 Unspecified place in unspecified non-institutional (private) residence as the place of occurrence of the external cause: Secondary | ICD-10-CM

## 2010-04-29 DIAGNOSIS — Z951 Presence of aortocoronary bypass graft: Secondary | ICD-10-CM

## 2010-04-29 DIAGNOSIS — D62 Acute posthemorrhagic anemia: Secondary | ICD-10-CM | POA: Diagnosis not present

## 2010-04-30 LAB — CBC
HCT: 22.2 % — ABNORMAL LOW (ref 36.0–46.0)
MCH: 30.1 pg (ref 26.0–34.0)
MCV: 90.2 fL (ref 78.0–100.0)
RBC: 2.46 MIL/uL — ABNORMAL LOW (ref 3.87–5.11)
WBC: 9.7 10*3/uL (ref 4.0–10.5)

## 2010-04-30 LAB — BASIC METABOLIC PANEL
CO2: 25 mEq/L (ref 19–32)
Chloride: 108 mEq/L (ref 96–112)
GFR calc Af Amer: >60 mL/min (ref >60)
Potassium: 4.2 mEq/L (ref 3.5–5.1)
Sodium: 137 mEq/L (ref 135–145)

## 2010-05-01 LAB — HEMOGLOBIN AND HEMATOCRIT, BLOOD
HCT: 32.4 % — ABNORMAL LOW (ref 36.0–46.0)
Hemoglobin: 10.8 g/dL — ABNORMAL LOW (ref 12.0–15.0)

## 2010-05-01 LAB — TYPE AND SCREEN
Antibody Screen: NEGATIVE
Unit division: 0

## 2010-05-01 LAB — PROTIME-INR
INR: 1.05 (ref 0.00–1.49)
Prothrombin Time: 13.9 seconds (ref 11.6–15.2)

## 2010-05-01 LAB — BASIC METABOLIC PANEL
CO2: 28 mEq/L (ref 19–32)
Calcium: 8.6 mg/dL (ref 8.4–10.5)
Creatinine, Ser: 0.55 mg/dL (ref 0.4–1.2)
GFR calc Af Amer: >60 mL/min (ref >60)
GFR calc non Af Amer: >60 mL/min (ref >60)

## 2010-05-02 LAB — HEMOGLOBIN AND HEMATOCRIT, BLOOD: HCT: 30.3 % — ABNORMAL LOW (ref 36.0–46.0)

## 2010-05-05 LAB — PROTIME-INR: Prothrombin Time: 34 seconds — ABNORMAL HIGH (ref 11.6–15.2)

## 2010-05-05 LAB — CBC
MCH: 31.2 pg (ref 26.0–34.0)
MCV: 94.9 fL (ref 78.0–100.0)
Platelets: 156 10*3/uL (ref 150–400)
RBC: 4.13 MIL/uL (ref 3.87–5.11)

## 2010-05-05 LAB — DIFFERENTIAL
Eosinophils Absolute: 0.1 10*3/uL (ref 0.0–0.7)
Eosinophils Relative: 1 % (ref 0–5)
Lymphs Abs: 0.8 10*3/uL (ref 0.7–4.0)
Monocytes Relative: 9 % (ref 3–12)

## 2010-05-07 LAB — TYPE AND SCREEN: Antibody Screen: NEGATIVE

## 2010-05-07 LAB — POCT I-STAT, CHEM 8
Chloride: 104 mEq/L (ref 96–112)
Creatinine, Ser: 0.9 mg/dL (ref 0.4–1.2)
Glucose, Bld: 95 mg/dL (ref 70–99)
Potassium: 4.1 mEq/L (ref 3.5–5.1)

## 2010-05-07 LAB — HEPATIC FUNCTION PANEL
Indirect Bilirubin: 1.6 mg/dL — ABNORMAL HIGH (ref 0.3–0.9)
Total Protein: 6 g/dL (ref 6.0–8.3)

## 2010-05-07 LAB — CBC
HCT: 20.8 % — ABNORMAL LOW (ref 36.0–46.0)
HCT: 26.1 % — ABNORMAL LOW (ref 36.0–46.0)
Hemoglobin: 7 g/dL — ABNORMAL LOW (ref 12.0–15.0)
Hemoglobin: 8.2 g/dL — ABNORMAL LOW (ref 12.0–15.0)
Hemoglobin: 8.7 g/dL — ABNORMAL LOW (ref 12.0–15.0)
MCH: 31.7 pg (ref 26.0–34.0)
MCH: 32.2 pg (ref 26.0–34.0)
MCHC: 33.3 g/dL (ref 30.0–36.0)
MCV: 94.8 fL (ref 78.0–100.0)
RBC: 2.2 MIL/uL — ABNORMAL LOW (ref 3.87–5.11)
RBC: 2.54 MIL/uL — ABNORMAL LOW (ref 3.87–5.11)
WBC: 9.5 10*3/uL (ref 4.0–10.5)

## 2010-05-07 LAB — DIFFERENTIAL
Eosinophils Absolute: 0.1 10*3/uL (ref 0.0–0.7)
Eosinophils Relative: 1 % (ref 0–5)
Lymphs Abs: 1.8 10*3/uL (ref 0.7–4.0)
Monocytes Absolute: 1.4 10*3/uL — ABNORMAL HIGH (ref 0.1–1.0)
Monocytes Relative: 9 % (ref 3–12)

## 2010-05-07 LAB — PREPARE RBC (CROSSMATCH)

## 2010-05-07 LAB — PROTIME-INR
Prothrombin Time: 16.6 seconds — ABNORMAL HIGH (ref 11.6–15.2)
Prothrombin Time: 17 seconds — ABNORMAL HIGH (ref 11.6–15.2)

## 2010-05-07 LAB — HEMOGLOBIN AND HEMATOCRIT, BLOOD: Hemoglobin: 8.2 g/dL — ABNORMAL LOW (ref 12.0–15.0)

## 2010-05-07 LAB — FIBRINOGEN: Fibrinogen: 448 mg/dL (ref 204–475)

## 2010-05-07 LAB — HEPARIN LEVEL (UNFRACTIONATED): Heparin Unfractionated: 0.25 IU/mL — ABNORMAL LOW (ref 0.30–0.70)

## 2010-05-07 NOTE — Discharge Summary (Signed)
  NAMEJAYNI, Paula Ferguson NO.:  [id number]  MEDICAL RECORD NO.:  [id number]           PATIENT TYPE:  I  LOCATION:  5037                         FACILITY:  [facility]  PHYSICIAN:  [name]. [name], M.D. DATE OF BIRTH:  November 15, 1940  DATE OF ADMISSION:  04/29/2010 DATE OF DISCHARGE:  05/02/2010                              DISCHARGE SUMMARY   ADMISSION DIAGNOSIS:  Right shoulder pain, status post failed total shoulder arthroplasty.  DISCHARGE DIAGNOSIS:  Right shoulder pain, status post failed total shoulder arthroplasty, status post revision right total shoulder arthroplasty.  BRIEF HISTORY:  The patient is a 70 year old female with worsening right shoulder pain secondary to failed total shoulder arthroplasty.  The patient elected for surgery for decrease pain and increase function.  PROCEDURE:  The patient had a right total shoulder arthroplasty with reverse revision by Dr. Malon Kindle on April 29, 2010.  Her condition was stable, no complications, general anesthesia, and Standley Dakins was assistant during the procedure.  HOSPITAL COURSE:  The patient was admitted on April 29, 2010, for the above-stated procedure which she tolerated well.  After adequate time in Postanesthesia Care Unit, she was transferred to 5000.  On postop day #1, the patient complained of moderate pain to the right shoulder, but was able to work with physical therapy and occupational therapy quite well.  She continued to improve over the next couple of days, thus the patient can be discharged home on May 02, 2010.  Her wound was healing well and she was afebrile throughout entire stay.  DISCHARGE PLAN:  The patient will be discharged home on May 02, 2010.  CONDITION:  Stable.  DIET:  Regular.  DISCHARGE MEDICATIONS:  Norco 5/325 one to two tablets q.4-6 h. p.r.n. pain and Robaxin 500 mg p.o. q.6 h. along with her normal home medications of Lipitor 20 mg daily, Wellbutrin 150 mg daily,  vitamin D3 400 units daily, levothyroxine 100 mcg daily, Cozaar 100 mg daily, trazodone 100 mg nightly, Coumadin per pharmacy protocol, and verapamil 240 mg daily.  FOLLOWUP:  The patient will follow back up with Dr. Malon Kindle in 2 weeks.  She does have allergies to: 1. MORPHINE. 2. DEMEROL. 3. DILAUDID. 4. Dose with Dr. Malon Kindle plaques     Maisie Fus B. Dixon, P.A.   ______________________________ [name]. [name], M.D.    TBD/MEDQ  D:  05/02/2010  T:  05/03/2010  Job:  [id number]  Electronically Signed by Standley Dakins P.A. on 05/03/2010 04:40:21 PM Electronically Signed by Malon Kindle  on 05/07/2010 11:22:53 AM

## 2010-05-07 NOTE — Op Note (Signed)
Paula Ferguson, SLAGER NO.:  [id number]  MEDICAL RECORD NO.:  [id number]          PATIENT TYPE:  LOCATION:                                 FACILITY:  PHYSICIAN:  [name]. [name], M.D. DATE OF BIRTH:  March 06, 1940  DATE OF PROCEDURE:  04/29/2010 DATE OF DISCHARGE:                              OPERATIVE REPORT   PREOPERATIVE DIAGNOSES: 1. Right failed shoulder arthroplasty. 2. Bilateral thumb carpometacarpal arthritis, severe.  POSTOPERATIVE DIAGNOSES: 1. Right failed shoulder arthroplasty. 2. Bilateral thumb carpometacarpal arthritis, severe.  PROCEDURES PERFORMED: 1. Revision of right reverse total shoulder arthroplasty with removal     of prior hemiarthroplasty stem. 2. Bilateral injection of thumb carpometacarpal joint.  ATTENDING SURGEON:  [name]. [name], MD  ASSISTANT:  [name]. Dixon, PA  ANESTHESIA:  General anesthesia was used plus interscalene block.  ESTIMATED BLOOD LOSS:  About 200 mL.  FLUID REPLACEMENT:  15 mL crystalloid.  URINE OUTPUT:  300 mL.  INSTRUMENT COUNT:  Correct.  COMPLICATIONS:  None.  Perioperative antibiotics were given.  INDICATIONS:  The patient is a 70 year old female with right shoulder pain and limited function secondary to failed right shoulder arthroplasty.  The patient has had a prior attempted revision shoulder arthroplasty and we found that the patient had significant bony defect secondary to loose glenoid and cystic formation within the glenoid vault.  The patient subsequently was bone grafted in that glenoid defect.  This is going on to heal that bone graft and now is felt to have sufficient bone within the glenoid to proceed with a reverse total shoulder arthroplasty.  Risks and benefits were discussed with the patient.  The patient would like to proceed surgically.  The informed consent was obtained.  DESCRIPTION OF THE PROCEDURE:  After adequate level of anesthesia was achieved, the patient was  positioned in the modified beach-chair position.  Bilateral thumb CMC joints were sterilely prepped and then injected with 1.5 mL of Kenalog, 1.5 mL of lidocaine into these joints without complication.  We then went ahead and positioned the patient in that beach-chair position.  All neurovascular structures were padded appropriately and right shoulder was then sterilely prepped and draped in usual manner.  The patient's prior deltopectoral incision was utilized after sterile prep and drape.  Time-out was called prior to the initiation of the injection of the [facility] joints and before the shoulder surgery as a separate procedure.  We went ahead and utilized the 10 blade scalpel through the skin and then subcutaneous dissection with Bovie electrocautery.  The patient's pectoralis muscle and tendon was really fairly atrophic.  I pretty quickly got onto the conjoint tendon and that was fairly scarred down to the lower structures, identified the coracoid process and basically worked from proximal to distal trying to get that subconjoint interval careful not to injure the musculocutaneous or axillary nerves.  Axillary nerve was encased in much of scar tissue. We had to find that and then dissected gently using a tonsil clamp and we were careful to protect that.  Basically that nerve was dissected free from all surrounding soft tissues that could be protected and all the  scar tissue was removed that being the anterior-inferior capsule or what was remaining of that structure.  The subscapularis was completely excised as that was basically a bunch of scar.  We released off the humeral neck so that we could fully visualize the humerus and also released the remaining rotator cuff off the posterior aspect of the humerus.  We were able to get the humeral head off.  We left the humeral stem in place as we just wanted to make sure that there was no fracture during the preparation of the glenoid.  We next  directed our attention toward the glenoid, did a 360-degree soft tissue release around that area so we could get the good handle on the scapular anatomy feeling for the scapular neck inferiorly and trying to get handle on eversion, the bone incorporation was excellent.  We found the best fit and this was an extremely small glenoid and compromised bone quality, so we wanted to make sure that we centered the metaglene as best we could on the glenoid and we were able to get the metaglene centered.  We reamed just very very lightly for that over a central guide pin that was placed.  We were happy with a guidepin.  We then went ahead and placed our metaglene in place impacting that in and noting that that exited out quite a bit posteriorly right on the posterior aspect of the scapular body and rather than trying to redirect that, we accepted that as the metaglene seemed stable.  We then placed our inferior screw which again exited out posteriorly and it seemed there were some version issues but we felt likely a decent purchase of that screw and very good purchase into the base of the coracoid with our superior screw and then we had a good anterior screw as well, they were all locked.  With those screws locked in place, we had a stable metaglene and we were hoping for good bony growth onto the back of that metaglene.  We then went ahead and placed a 38 neutral glenosphere and screwed that in position and directed our attention towards the humerus.  We were able to do just a little bit of proximal work with some osteotomes and then I tapped out the size 8 stem.  We were able to ream up to a size 10 to get into some good cortical bone with no fracture noted, and we went ahead and placed our humeral implant for the DePuy Delta Xtend reverse prosthesis.  This was a cemented stem that we are going to use, a regular stem length, not the long stem and we used the one metaphysis size and then went ahead  and reduced with a 38 +6 poly insert.  We were happy with that soft tissue balance.  We then went ahead and cemented the stem into position with about 5 degrees of retroversion and with the stem cemented in place well supported by bone, we reduced the shoulder with the +6 poly.  We were happy with that soft tissue balancing, nice tension on the conjoint.  No instability at all with negative shuck and negative gapping with external rotation.  We then thoroughly irrigated, checked the axillary nerve and musculocutaneous nerve which all felt intact and were visualized and noted to be intact and then we went ahead and closed deltopectoral interval as best as we could with some 0 Vicryl suture followed by 2-0 Vicryl for subcutaneous closure and 4-0 Monocryl for skin.  Steri-Strips applied  followed by a sterile dressing.  The patient tolerated the surgery well.     [name]. [name], M.D.     SRN/MEDQ  D:  05/02/2010  T:  05/03/2010  Job:  [id number]  Electronically Signed by Malon Kindle  on 05/07/2010 11:22:58 AM

## 2010-05-10 LAB — GRAM STAIN

## 2010-05-10 LAB — DIFFERENTIAL
Eosinophils Relative: 0 % (ref 0–5)
Lymphocytes Relative: 14 % (ref 12–46)
Lymphs Abs: 1.1 10*3/uL (ref 0.7–4.0)
Monocytes Absolute: 0.8 10*3/uL (ref 0.1–1.0)
Monocytes Relative: 10 % (ref 3–12)
Neutro Abs: 5.7 10*3/uL (ref 1.7–7.7)

## 2010-05-10 LAB — CBC
HCT: 29.6 % — ABNORMAL LOW (ref 36.0–46.0)
HCT: 31.3 % — ABNORMAL LOW (ref 36.0–46.0)
HCT: 40.3 % (ref 36.0–46.0)
Hemoglobin: 9.9 g/dL — ABNORMAL LOW (ref 12.0–15.0)
Hemoglobin: 10.9 g/dL — ABNORMAL LOW (ref 12.0–15.0)
Hemoglobin: 13.9 g/dL (ref 12.0–15.0)
MCV: 91.4 fL (ref 78.0–100.0)
Platelets: 191 10*3/uL (ref 150–400)
RBC: 3.17 MIL/uL — ABNORMAL LOW (ref 3.87–5.11)
RBC: 4.41 MIL/uL (ref 3.87–5.11)
RDW: 14.7 % (ref 11.5–15.5)
RDW: 14.4 % (ref 11.5–15.5)
WBC: 15.1 10*3/uL — ABNORMAL HIGH (ref 4.0–10.5)
WBC: 7.7 10*3/uL (ref 4.0–10.5)

## 2010-05-10 LAB — TYPE AND SCREEN
ABO/RH(D): O POS
Antibody Screen: NEGATIVE

## 2010-05-10 LAB — BASIC METABOLIC PANEL
BUN: 12 mg/dL (ref 6–23)
CO2: 28 mEq/L (ref 19–32)
Calcium: 9.4 mg/dL (ref 8.4–10.5)
Chloride: 103 mEq/L (ref 96–112)
GFR calc Af Amer: >60 mL/min (ref >60)
GFR calc Af Amer: >60 mL/min (ref >60)
GFR calc non Af Amer: >60 mL/min (ref >60)
GFR calc non Af Amer: >60 mL/min (ref >60)
Glucose, Bld: 102 mg/dL — ABNORMAL HIGH (ref 70–99)
Glucose, Bld: 113 mg/dL — ABNORMAL HIGH (ref 70–99)
Glucose, Bld: 82 mg/dL (ref 70–99)
Potassium: 3.9 mEq/L (ref 3.5–5.1)
Potassium: 3.7 mEq/L (ref 3.5–5.1)
Potassium: 4.2 mEq/L (ref 3.5–5.1)
Sodium: 132 mEq/L — ABNORMAL LOW (ref 135–145)
Sodium: 134 mEq/L — ABNORMAL LOW (ref 135–145)
Sodium: 135 mEq/L (ref 135–145)

## 2010-05-10 LAB — PROTIME-INR
INR: 1.1 (ref 0.00–1.49)
Prothrombin Time: 19.1 seconds — ABNORMAL HIGH (ref 11.6–15.2)
Prothrombin Time: 14.1 seconds (ref 11.6–15.2)

## 2010-05-10 LAB — URINE MICROSCOPIC-ADD ON

## 2010-05-10 LAB — URINALYSIS, ROUTINE W REFLEX MICROSCOPIC
Glucose, UA: NEGATIVE mg/dL
Leukocytes, UA: NEGATIVE
Nitrite: NEGATIVE
pH: 6 (ref 5.0–8.0)

## 2010-05-10 LAB — WOUND CULTURE: Culture: NO GROWTH

## 2010-05-10 LAB — APTT: aPTT: 52 seconds — ABNORMAL HIGH (ref 24–37)

## 2010-05-25 NOTE — Consult Note (Signed)
  NAMESTASHA, Paula NO.:  [id number]  MEDICAL RECORD NO.:  [id number]           PATIENT TYPE:  LOCATION:                                 FACILITY:  PHYSICIAN:  Paula Ferguson, M.D. DATE OF BIRTH:  December 12, 1940  DATE OF CONSULTATION: DATE OF DISCHARGE:                                CONSULTATION   CHIEF COMPLAINTS:  Right shoulder pain status post total shoulder arthroplasty.  HISTORY OF PRESENT ILLNESS:  The patient is a 70 year old female with worsening right shoulder pain status post failed total shoulder arthroplasty.  The patient elected to have surgery to decrease pain and increase function.  PAST MEDICAL HISTORY: 1. Hypertension. 2. Heart murmur, this has actually been improved after an aortic valve     replacement. 3. Hypothyroidism.  FAMILY MEDICAL HISTORY:  Lung cancer and COPD.  SOCIAL HISTORY:  The patient does smoke or use alcohol.  Patient of Dr. Amador Cunas.  DRUG ALLERGIES:  MORPHINE.  CURRENT MEDICATIONS: 1. Verapamil 240 mg daily. 2. Bupropion 150 mg daily. 3. Lipitor 20 mg daily. 4. Synthroid 100 mcg daily. 5. Coumadin 5 mg daily. 6. Trazodone one nightly. 7. Losartan potassium 100 mg daily.  REVIEW OF SYSTEMS:  Pain with range of motion of bilateral shoulders and also the left knee.  PHYSICAL EXAMINATION:  VITAL SIGNS:  Pulse 84 and regular, respirations 16, blood pressure 152/86. GENERAL:  The patient is healthy-appearing 70 year old female, in no acute distress.  Pleasant mood and affect.  Oriented x3. HEAD AND NECK:  Adequate range of motion.  Neurovascularly intact distally.  Neck shows full range of motion without any tenderness. CHEST:  Active breath sounds bilaterally.  No wheezes, rhonchi, or rales. HEART:  Regular rate and rhythm.  No murmur. ABDOMEN:  Nontender, nondistended. EXTREMITIES:  She had moderate tenderness in bilateral shoulders with range of motion exercise but neurovascularly, she is intact.   She has no edema.  X-rays show a failed total shoulder arthroplasty on the right.  IMPRESSION:  Right failed total shoulder arthroplasty.  PLAN OF ACTION:  A reverse total shoulder arthroplasty revision by Dr. Malon Kindle.     Thomas B. Dixon, P.A.   ______________________________ Paula Ferguson, M.D.    TBD/MEDQ  D:  04/26/2010  T:  04/27/2010  Job:  [id number]  Electronically Signed by Standley Dakins P.A. on 05/17/2010 08:15:42 AM Electronically Signed by Malon Kindle  on 05/25/2010 08:07:59 PM

## 2010-06-01 LAB — URINALYSIS, ROUTINE W REFLEX MICROSCOPIC
Bilirubin Urine: NEGATIVE
Glucose, UA: NEGATIVE mg/dL
Hgb urine dipstick: NEGATIVE
Ketones, ur: NEGATIVE mg/dL
Nitrite: NEGATIVE
Specific Gravity, Urine: 1.017 (ref 1.005–1.030)
pH: 7 (ref 5.0–8.0)

## 2010-06-01 LAB — BASIC METABOLIC PANEL
BUN: 10 mg/dL (ref 6–23)
CO2: 29 mEq/L (ref 19–32)
CO2: 30 mEq/L (ref 19–32)
Calcium: 8.4 mg/dL (ref 8.4–10.5)
Calcium: 9.8 mg/dL (ref 8.4–10.5)
Chloride: 102 mEq/L (ref 96–112)
Creatinine, Ser: 0.59 mg/dL (ref 0.4–1.2)
Creatinine, Ser: 0.6 mg/dL (ref 0.4–1.2)
GFR calc Af Amer: >60 mL/min (ref >60)
GFR calc Af Amer: >60 mL/min (ref >60)
GFR calc non Af Amer: >60 mL/min (ref >60)
GFR calc non Af Amer: >60 mL/min (ref >60)
Glucose, Bld: 118 mg/dL — ABNORMAL HIGH (ref 70–99)
Glucose, Bld: 86 mg/dL (ref 70–99)
Sodium: 133 mEq/L — ABNORMAL LOW (ref 135–145)
Sodium: 136 mEq/L (ref 135–145)

## 2010-06-01 LAB — CBC
HCT: 37.5 % (ref 36.0–46.0)
Hemoglobin: 12.8 g/dL (ref 12.0–15.0)
Hemoglobin: 9.8 g/dL — ABNORMAL LOW (ref 12.0–15.0)
MCHC: 34.1 g/dL (ref 30.0–36.0)
MCHC: 34.4 g/dL (ref 30.0–36.0)
MCV: 89.7 fL (ref 78.0–100.0)
Platelets: 140 10*3/uL — ABNORMAL LOW (ref 150–400)
RBC: 3.01 MIL/uL — ABNORMAL LOW (ref 3.87–5.11)
RBC: 3.17 MIL/uL — ABNORMAL LOW (ref 3.87–5.11)
RBC: 4.18 MIL/uL (ref 3.87–5.11)
RDW: 15.4 % (ref 11.5–15.5)
RDW: 15.5 % (ref 11.5–15.5)
WBC: 7.6 10*3/uL (ref 4.0–10.5)

## 2010-06-01 LAB — DIFFERENTIAL
Basophils Absolute: 0 10*3/uL (ref 0.0–0.1)
Basophils Relative: 1 % (ref 0–1)
Eosinophils Absolute: 0 10*3/uL (ref 0.0–0.7)
Eosinophils Relative: 1 % (ref 0–5)
Monocytes Absolute: 0.4 10*3/uL (ref 0.1–1.0)
Monocytes Relative: 11 % (ref 3–12)
Neutro Abs: 2.6 10*3/uL (ref 1.7–7.7)

## 2010-06-01 LAB — TYPE AND SCREEN: Antibody Screen: NEGATIVE

## 2010-06-01 LAB — PROTIME-INR
INR: 1.2 (ref 0.00–1.49)
INR: 1.2 (ref 0.00–1.49)
INR: 1.3 (ref 0.00–1.49)
Prothrombin Time: 15.7 seconds — ABNORMAL HIGH (ref 11.6–15.2)
Prothrombin Time: 15.8 seconds — ABNORMAL HIGH (ref 11.6–15.2)
Prothrombin Time: 16 seconds — ABNORMAL HIGH (ref 11.6–15.2)
Prothrombin Time: 16.3 seconds — ABNORMAL HIGH (ref 11.6–15.2)

## 2010-07-05 NOTE — Op Note (Signed)
Paula Ferguson, Paula Ferguson NO.:  [id number]   MEDICAL RECORD NO.:  [id number]          PATIENT TYPE:  INP   LOCATION:  2899                         FACILITY:  [facility]   PHYSICIAN:  [name]. [name], M.D. DATE OF BIRTH:  [birth year]   DATE OF PROCEDURE:  DATE OF DISCHARGE:                               OPERATIVE REPORT   PREOPERATIVE DIAGNOSES:  Right shoulder end stage osteoarthritis in the  setting of prior proximal humerus fracture with deformity and also  status post recent arthroscopy.   POSTOPERATIVE DIAGNOSES:   PROCEDURE:   SURGEON:  [name]. [name], M.D.   ASSISTANT:  Nedra Hai, PA-C.   ANESTHESIA:  General anesthesia.   ESTIMATED BLOOD LOSS:  250 ml.   FLUIDS REPLACED:  1500 ml. Crystalloid.   INSTRUMENT COUNT:  Correct.   COMPLICATIONS:  None.   MEDICATIONS:  Antibiotics given.   INDICATIONS FOR PROCEDURE:  The patient is a 70 year old female with a  history of worsening shoulder pain secondary to arthritis in her  shoulder.  The patient has failed extensive period of conservative  management including injection therapy, anti-inflammatory.  She has  recently had arthroscopy from Dr. Janene Harvey that provided no  significant relief.  The patient presents now for operative arthroplasty  of her shoulder.  Of note, the patient has had a prior proximal humerus  fracture and has had a humeral malunion with hemometaphysis.  The  patient presents for operative treatment.  Informed consent was  obtained.   DESCRIPTION OF PROCEDURE:  After an adequate level of anesthesia  achieved, the patient was positioned in a modified beachchair position.  The right shoulder was examined under anesthesia.  She had passive range  of motion forward elevation up to about 110, external rotation up to  about 45 degrees, internal rotation 30 degrees with arm abducted.  We  sterilely prepped and draped the right shoulder in the usual manner.  We  then entered the  shoulder through a deltopectoral approach, started the  coracoid extending down the anterior humeral shaft.  Dissection was  carried sharply down to the subcutaneous tissues.  The patient was noted  to be just oozy with bleeding.  We went ahead and ligated the systolic  vein given there were multiple perforators that seemed to be avulsing as  we were just making a blunt dissection.  This helped somewhat but the  patient still was just basically oozy with all of her tissues.  We  developed the deltopectoral interval, identified the conjoined tendon,  took it out medially, identified the subscapularis which was in good  condition.  We took that off 1 cm medial to the facet screw using a  needle tipped Bovie and then developed a rotator interval.  We then  placed traction sutures in the subscapularis, debrided off the anterior  capsule.  The patient was noted to have significant arthritic wear on  the humeral head but the glenoid appeared to be in fairly good shape  with just the very beginnings of some chondromalacia present in the  central portion of the glenoid.  She had concentric glenoid  with good  realm of intact glenoid labrum all around.  At this point, we went ahead  and externally rotated the shoulder approximately 15-20 degrees and a  made our neck cut using a neck cut guide retracting the rotator cuff  which appeared to be in decent shape.  Once we had resected the humeral  head, I sized it to size 44.  We next went ahead and started trying to  instrument the canals. This was difficult to do, encountering fracture  callus and hard bone in the metaphyseal area.  Due to the patient's  deformity and difficulty getting down the canal, we took multiple  intraoperative x-rays to verify that we were indeed using the reamers to  get down the canal.  I was only able to get the reamers up to a size 8  before we started encountering significant resistance.  Essentially, the  patient had  healed in varus and so we were limited again with our  ability to place a larger stem.  At this point, we went ahead and used  our broach to broach up to a size 8 stem.  We could not do anything  bigger.  In fact, the size 8 just barely came down and I obtained an x-  ray demonstrating that the medial calcar of the humeral stem was  basically impinging on the humeral shaft where the fracture had healed.  Thus, this was as far as we could go stem-size wise.  The patient was  fairly small anyway so this is a decent fit.  We next went ahead and  trialled with a 44/18 Eccentric head which we rotated in a posterior -  inferior position giving the best coverage.  We reduced the shoulder,  had excellent soft tissue balance.  The cuff came down nicely right at  the top of the humeral head and the humeral head did not appear to be  impinging on the cuff.  We had excellent balance being able to translate  50% posteriorly and inferiorly. Given the patient's excellent Glenoid  articular cartilage, with a robust labrum, all the way around, it felt  like stability could be an issue with the patient having had a prior  acromioplasty and distal clavicle incision, thus we decided to go ahead  and stick with the hemiarthroplasty and not resurface the glenoid with  the humeral component and at this time, thoroughly irrigated and we went  ahead and removed the trial components and then impaction grafted the  humerus and impacted the 8 [employer] stem into place securely.  This went down even to touch better than the broach did and came flush  against the osteotomy.  Next, we went ahead and trialled again with the  44/18 Eccentric and were happy with that and then took the real 44  Eccentric head and impacted into place.  We did do a biceps tenotomy on  the patient as her biceps appeared somewhat damaged and we went ahead  and tenotomized that and then we went ahead and repaired the subscap  using  interrupted 5 wire suture, repairing right back up to the rotator  interval.  At this point, we thoroughly irrigated close to deltopectoral  interval and 0-Vicryl suture followed by subcutaneous closure with 2-0  Vicryl followed by 4-0 Monocryl for skin, Steri-Strips were followed by  a dressing.  The patient tolerated surgery well.      [name]. [name], M.D.  Electronically Signed     SRN/MEDQ  D:  03/15/2007  T:  03/15/2007  Job:  [id number]

## 2010-07-05 NOTE — Op Note (Signed)
NAMEZYRIA, FISCUS NO.:  [id number]   MEDICAL RECORD NO.:  [id number]          PATIENT TYPE:  OIB   LOCATION:  5031                         FACILITY:  [facility]   PHYSICIAN:  [name]. [name], M.D. DATE OF BIRTH:  06-24-1940   DATE OF PROCEDURE:  05/29/2008  DATE OF DISCHARGE:                               OPERATIVE REPORT   PREOPERATIVE DIAGNOSIS:  Right shoulder painful, hemiarthroplasty.   POSTOPERATIVE DIAGNOSIS:  Right shoulder painful, hemiarthroplasty.   PROCEDURES PERFORMED:  Right shoulder revision, total shoulder  arthroplasty.   ATTENDING SURGEON:  [name]. [name], MD   ASSISTANT:  [name]. Dixon, PA-C   General anesthesia plus local was used.   ESTIMATED BLOOD LOSS:  100 mL.   FLUID REPLACEMENT:  1800 mL crystalloid.   URINE OUTPUT:  250 mL.   INSTRUMENT COUNTS:  Correct.   COMPLICATIONS:  None.   Perioperative antibiotics were given.   INDICATIONS:  The patient is a 70 year old female with a history of  right shoulder pain persistent following right shoulder  hemiarthroplasty.  The patient has a history of prior right shoulder  fracture.  It was treated conservatively.  She went on to  hemiarthroplasty at which time her glenoid cartilage looked pretty good.  She did well after her initial hemiarthroplasty, but began developing  pain over the last 6-9 months.  The patient presents now with  radiographic evidence of erosion of the glenoid and desiring total  shoulder revision arthroplasty.  Informed consent was obtained.   DESCRIPTION OF THE PROCEDURE:  After an adequate level of anesthesia was  achieved, the patient was positioned in modified beach chair position.  The right shoulder was examined under anesthesia, forward flexion  limited to about 100 degrees, abduction 70, external rotation is about  20-30, internal rotation just to her abdomen.  After sterile prep and  drape of the shoulder, we entered the shoulder through a  standard  deltopectoral approach that we had used before, dissection down to  subcutaneous tissues and deltoid and pectoral identified and split.  There was extensive scarring developed just below that.  We had to find  the coracoid and conjoint tendon and freed that up, subscapularis freed  up as well.  There is a significant scarring in the subdeltoid plain,  which we released in the subacromial area.  We finally got it to where  we could get her retractors in placing self-retaining retractors and  then went ahead and release the subscapularis.  This was robust and in  good shape.  We placed #2 FiberWire suture in a modified W stitch x3 in  the end of subscap.  We then went ahead and release the scar tissue from  around the humerus.  I spent quite bit of time 20 or 30 minutes doing  this.  There was persistent bone on the inferior neck, which we removed  as well.  This looked like some remnant of some prior inferior humeral  osteophyte as well there was some osteophyte remaining posteriorly.  It  did appear that the humeral head component was in good condition.  There  was no signs of any material wear on that component; however, the  glenoid appeared in horrible shape with erosion posteriorly in a linear  fashion running from about 10 o'clock position to the 7 or 8 o'clock  position.  At this point, we went ahead and removed the head.  We  removed some scar tissue from around the stem.  The stem was well fixed  and in good position relative to the rotator cuff.  At this point, we  went ahead and retracted the humerus posteriorly.  We were careful to  protect the axillary nerve.  We dissected soft tissue away from the  glenoid.  The glenoid was just an very rough shape and we went ahead and  removed the labrum and then found the center of the glenoid, which was  going to be 40 extra small and drilled a central hole, we then reamed  till where we get some decent subchondral bone.  Trying  not to violate  that subchondral plate, so we kept some support.  There was definitely  some deficiency bone wise posteriorly in a linear fashion which we  cannot totally correct with our reaming, and there were also two cysts,  which we removed the soft tissue from the cysts using small curettes.  At this point, we drilled for the north-south drill holes and then used  a bur to connect those holes to make the keel and used the keel punch  gently, so as not to damage the remaining bone and the glenoid.  We did  feel like we are able to seat a 40 extra small trial and it had good  support.  We then reduced the shoulder with 40 x 18 head and we are  happy with that soft tissue balance.  We removed all the trial  components.  We then placed thrombin-soaked Gelfoam in the glenoid vault  and then cemented the glenoid component in the place.  Once the cement  was allowed to hardened, we went ahead and placed a 40 x 18 head and  impacted that in place and reduced the shoulder.  We are happy with soft  tissue balance and range of motion, we then went ahead and repaired the  subscap anatomically back to the lesser tuberosity into the rotator  interval and oversewed that rotator interval with a figure-of-eight #2  FiberWire suture.  The stability was nice and range of motion was  excellent out to about 45 degrees of external rotation and 120-130  degrees forward elevation and no impingement coming across body.  Thorough irrigation, closed her deltopectoral interval using interrupted  0 Vicryl suture followed by 2-0 Vicryl subcutaneous closure and 4-0  Monocryl for skin.  Steri-Strips were applied followed by sterile  dressing.      [name]. [name], M.D.  Electronically Signed     SRN/MEDQ  D:  05/29/2008  T:  05/30/2008  Job:  [id number]

## 2010-07-05 NOTE — Discharge Summary (Signed)
Paula Ferguson, Paula NO.:  [id number]   MEDICAL RECORD NO.:  [id number]          PATIENT TYPE:  OIB   LOCATION:  5031                         FACILITY:  [facility]   PHYSICIAN:  [name]. [name], M.D. DATE OF BIRTH:  09-01-1940   DATE OF ADMISSION:  05/29/2008  DATE OF DISCHARGE:  06/01/2008                               DISCHARGE SUMMARY   ADMISSION DIAGNOSIS:  Right shoulder pain secondary to glenoid  osteoarthritis, status post hemiarthroplasty.   DISCHARGE DIAGNOSIS:  Right shoulder pain secondary to glenoid  osteoarthritis, status post hemiarthroplasty, status post total shoulder  arthroplasty conversion.   BRIEF HISTORY:  The patient is a 70 year old female with worsening right  shoulder pain, status post hemiarthroplasty.  The patient was noted to  have a worsening glenoid area thus it was discussed the revision would  be beneficial for pain relief.   PROCEDURE:  The patient had a right shoulder total shoulder arthroplasty  conversion by Dr. Malon Kindle on May 29, 2008.   ASSISTANT:  [name]. Dixon, PA-C   ANESTHESIA:  General anesthesia was used.   COMPLICATIONS:  None.   HOSPITAL COURSE:  The patient was admitted on May 29, 2008, for the  above-stated procedure, which she tolerated well.  After adequate time  of post anesthesia care unit, she was transferred up to 5000.  On postop  day #1, she complained of moderate pain to that right shoulder.  She was  initiated back on her Coumadin therapy, status post her CABG.  Her INR  was 1.3 and pharmacy was maintaining trying to regulate that for [place].  She was able to participate with physical therapy quite well.  On postop  day 3, she started to feel much better, especially on the postop day #3,  the patient looked good, the wound was healing well.  No signs of  cellulitis, erythema, or infection.  Labs within acceptable limits.  The  patient was afebrile.  The patient to be discharged home.   DISCHARGE PLAN:  The patient was discharged home on home health physical  therapy and occupational therapy on June 01, 2008.   CONDITION:  Stable.   DIET:  Regular.   FOLLOWUP:  The patient will follow back up with Dr. Malon Kindle in 2  weeks.   ALLERGIES:  1. DILAUDID.  2. MORPHINE.  3. DEMEROL.   DISCHARGE MEDICATIONS:  1. Wellbutrin 150 mg p.o. daily.  2. Lovenox 60 mg subcu b.i.d.  3. Hydrochlorothiazide 12.5 mg p.o. daily.  4. Levothyroxine 100 mcg p.o. daily.  5. Robaxin 5 mg p.o. q.6 h.  6. Benicar 40 mg p.o. daily.  7. Zocor 40 mg p.o. daily.  8. Trazodone 100 mg p.o. at bedtime.  9. Verapamil 240 mg p.o. daily.  10.Coumadin per pharmacy protocol and also per her physicians request      for maintaining Norco 5/325 one to two tablets q.4-6 h. p.r.n.      pain.   FOLLOWUP:  The patient to follow back up with Dr. Malon Kindle in 2  weeks.      Thomas B. Dixon, P.A.  [name]. [name], M.D.  Electronically Signed    TBD/MEDQ  D:  06/01/2008  T:  06/01/2008  Job:  [id number]

## 2010-07-08 NOTE — Op Note (Signed)
NAMECONNOR, FOXWORTHY NO.:  [id number]   MEDICAL RECORD NO.:  [id number]          PATIENT TYPE:  INP   LOCATION:  0002                         FACILITY:  [facility]   PHYSICIAN:  Madlyn Frankel. Charlann Boxer, M.D.  DATE OF BIRTH:  [birth year]   DATE OF PROCEDURE:  02/15/2006  DATE OF DISCHARGE:                               OPERATIVE REPORT   PREOPERATIVE DIAGNOSIS:  Left knee medial compartment osteoarthritis.   POSTOPERATIVE DIAGNOSIS:  Left knee medial compartment osteoarthritis.   PROCEDURE:  Left medial compartment partial knee replacement from  Biomet, size small femur, standard medial tibial component and a size #5  insert.   SURGEON:  Madlyn Frankel. Charlann Boxer, M.D.   ASSISTANT:  Yetta Glassman. Mann, PA   ANESTHESIA:  Spinal plus MAC.   SPECIMENS:  None.   COMPLICATIONS:  None.   DRAINS:  One.   TOURNIQUET TIME:  75 minutes at 250 mmHg.   BLOOD LOSS:  50 mL.   INDICATIONS FOR PROCEDURE:  The patient is a very pleasant, 70 year old  female who I have been following for some time for bilateral knee  discomfort.  She has had arthroscopic procedures performed and had known  degenerative changes primarily in the medial compartment of the left  knee.  We reviewed the surgical options and, given the fact that she is  a candidate for a partial knee replacement, she wished to pursue that  avenue.   We discussed the risks and benefits of standard infection, DVT, as well  as the risks of component failure and advanced degenerative changes in  the other parts of the knee requiring revision and further knee surgery.  Consent was obtained.   PROCEDURE IN DETAIL:  The patient was brought to the operative theater.  Once adequate anesthesia and preoperative antibiotics with 1 gm of Ancef  were administered, the patient was positioned supine.  A bump was placed  underneath the left hip.  The proximal thigh tourniquet was then placed.  The leg was then flexed and abducted and placed  onto a Biomet leg  holder.   Once the leg was positioned, the left lower extremity was prepped and  draped from the ankle to the toes with the appropriate stockinette in  the standard fashion.  A paramidline incision was made from just  proximal to the patella to the tibial tubercle.  A short, medial,  parapatellar arthrotomy was made lateral to the VMO insertion.  Exposure, including fat pad debridement, to allow for exposure of the  knee was carried out as well as elevating the capsular ligament tissues  off of the proximal medial tibia, just enough to get a retractor in  place.  Partial medial meniscectomy was carried out at this point.   With the knee exposed, including some of the fat pad underneath the  patellar tendon, and I then began removing osteophytes of the femur  including the notch.  This allowed for insertion of the reciprocating  saw aimed to the true center of the hip joint.  At this point, once I  had exposed the knee from this debridement, the extramedullary device  for the tibia was placed.  I then resected a cut that was perpendicular  in the AP and lateral plane first with the reciprocating saw toward the  joint followed by the use of an oscillating saw.   The initial wedge of bone was fairly thin so I cut back down to 3 mm  more.  I did have to trim more bone away from the tibial spine  laterally, in order to allow for a tibial component to sit without any  overhang medially.  Once I was happy with this, including the use of a  rasp to make fine adjustments, I placed a trial A tibia and was able to  get the #4 feeler gauge in without a problem.  It felt good in this  position with a couple of fingers pressure for the technique.   Once I was satisfied with this, attention was now directed to the femur.  I passed a starting cannula into the femur 1 cm above the medial corner  of the notch.  Intramedullary rod was then passed into the femoral  canal.  Then, using  a size #3 or feeler gauge and the femoral cutting  jig, or drill jig, I was able to drill holes into the femur with all  points consistent with the technique in relationship to the guide rod  and the tibia.  It was placed into the middle portion of the femoral  condyle.  I chose, at this point based on the size of the femur, to use  a size small component.   The proximal and distal holes were made.  At this point, the posterior  cutting rod was placed and the posterior cut made.  I compared this to  the posterior aspect of the small component and it matched up perfectly.  Following this, I used a 0 spigot and went ahead and milled the medial  femoral condyle and debrided bone with the osteotomes.  At this point,  we placed a trial tibia and a small femur.  I was able to get a #5 in  without retractors in flexion which felt very good.  In 20 degrees of  flexion with no retractions, I was able to get the #2 feeler gauge in  which felt good and symmetric to that of the #5 in flexion.   At this point, I went ahead and placed the #3 spigot and milled down the  condyle and debrided bone including osteophytes on the medial aspect of  the femur as well as the central portion.  I then went ahead and did  another trial reduction with the small femur and A tibia.  At this  point, I was very happy with the stability of the knee in terms of  medial and lateral ligament balancing and flexion/extension.  Given  this, final debridement of the femur was carried out as needed.   I then returned back to the tibia where the trial component was placed.  It was held in position with a pin and, using the reciprocating saw,  drilled out the channel for the keel.  A trial reduction was then  carried out with the keeled tibia as well as the small femur and the  standard trials for the polyethylene inserts.  Again, the size #5 was inserted and had couple of millimeters of play in 90 degrees of flexion  as well  as 20 degrees of flexion, I was happy with this, indicating a  well-balanced knee.  At this point, I went ahead and created a notch on the anterior aspects  of the femur anterior to the component to prevent impingement of the  polyethylene component.  Drill holes were made into the sclerotic distal  femur.  The knee was irrigated copiously with a liter of normal saline  solution.  Final debridement was carried out including completion of the  medial meniscectomy and removing soft tissues that could potentially  cause impingement.   Once I was satisfied that the knee was prepared, cement was mixed in two  batches.  The components were cemented first with the tibia and the  trial femur and the knee held at 45 degrees of flexion.  Then, the same  procedure was carried out for the femur with the second batch of cement.  At each time, I was able to remove cement.  Prior to placing my final #5  insert, I inspected the knee as much as I possibly could visualize  removing any cement and none was visualized.  Once I was content with  this, the final insert was placed.   Again, the knee was irrigated.  I injected the knee with 60 mL of  Marcaine with epinephrine.  I then placed a medium Hemovac drain.  The  tourniquet was let down at 75 minutes.  Hemostasis obtained.  At the  time of the closure, there was some oozing from bone cuts and osteophyte  debridement but nothing active.   The extension mechanism was then reapproximated in flexion with a #1  Vicryl.  The remainder was closed in layers with 2-0 Vicryl and 4-0  running Monocryl.  The patient's knee was cleaned and dressed sterilely  with Steri-Strips, dressing, sponge and a wrap with ice.   The patient was brought to the recovery room in stable condition.      Madlyn Frankel Charlann Boxer, M.D.  Electronically Signed     MDO/MEDQ  D:  02/15/2006  T:  02/15/2006  Job:  [id number]

## 2010-07-08 NOTE — H&P (Signed)
Paula Ferguson, Paula Ferguson NO.:  [id number]   MEDICAL RECORD NO.:  [id number]           PATIENT TYPE:   LOCATION:                                 FACILITY:   PHYSICIAN:  [name]. [name], M.D. DATE OF BIRTH:  1941-01-04   DATE OF ADMISSION:  DATE OF DISCHARGE:                              HISTORY & PHYSICAL   CHIEF COMPLAINT:  Right shoulder pain.   HISTORY OF PRESENT ILLNESS:  The patient is a 70 year old female with  increasing right shoulder pain that has been refractory to conservative  treatment.  The patient has elected to have a right total shoulder  arthroplasty by Dr. Malon Kindle.   PAST MEDICAL HISTORY:  1. Hypothyroid.  2. Hyperlipidemia.   FAMILY MEDICAL HISTORY:  Lung cancer.   PRIMARY CARE PHYSICIAN:  Patient of Dr. Amador Cunas.   SOCIAL HISTORY:  She is widowed.  Does not smoke, use alcohol or illicit  drugs.   ALLERGIES:  DILAUDID and MORPHINE which cause itching.   CURRENT MEDICATIONS:  1. Avalide 12.5 mg p.o. daily.  2. Budeprion 50 mg p.o. daily.  3. Lipitor 20 mg p.o. daily.  4. Synthroid 100 mcg p.o. daily.  5. Trazodone 100 mg p.o. q.h.s.  6. Verapamil 240 mg p.o. daily.  7. Coumadin 5 mg daily.   REVIEW OF SYSTEMS:  Pain with range of motion of her right shoulder.   PHYSICAL EXAMINATION:  VITAL SIGNS:  Pulse 80, respirations 18, blood  pressure 142/90.  GENERAL:  Healthy-appearing 70 year old female in no acute distress.  Pleasant mood and affect.  Alert and oriented x 3.  HEENT:  Head and neck shows full range of motion without any difficulty.  BACK:  No tenderness to cervical, thoracic or lumbar spines.  NEUROLOGIC:  Cranial nerves II-XII are grossly intact.  CHEST:  Active breath sounds bilaterally.  No wheezes, rales or rhonchi.  HEART:  Regular rate and rhythm, no murmur.  ABDOMEN:  Nontender and nondistended with active bowel sounds.  EXTREMITIES:  She has moderate tenderness of the right shoulder with  forward  flexion 90 degrees, external rotation to 40 degrees, internal  rotation of her abdomen.  Capillary refill is less than 2 seconds.  Grip  strength is 5/5 bilaterally.  SKIN:  She has no seen rashes.   DIAGNOSIS:  X-ray shows right shoulder end-stage osteoarthritis.   IMPRESSION:  Right shoulder end-stage osteoarthritis.   PLAN:  To have a right total shoulder arthroplasty by Dr. Malon Kindle.      Thomas B. Durwin Nora, P.A.      [name]. [name], M.D.  Electronically Signed    TBD/MEDQ  D:  02/27/2007  T:  02/27/2007  Job:  [id number]   cc:   Gordy Savers, MD

## 2010-07-08 NOTE — Op Note (Signed)
NAME:  Paula Ferguson, Paula Ferguson                       ACCOUNT NO.:  [id number]   MEDICAL RECORD NO.:  [id number]                   PATIENT TYPE:  INP   LOCATION:  2039                                 FACILITY:  [facility]   PHYSICIAN:  Gwenith Daily. Tyrone Sage, M.D.            DATE OF BIRTH:  10/25/1940   DATE OF PROCEDURE:  09/18/2001  DATE OF DISCHARGE:  09/23/2001                                 OPERATIVE REPORT   PREOPERATIVE DIAGNOSES:  Critical aortic stenosis and coronary occlusive  disease.   POSTOPERATIVE DIAGNOSES:  Critical aortic stenosis and coronary occlusive  disease.   OPERATION PERFORMED:  Aortic valve replacement with a #21 mechanical St.  Jude aortic valve prosthesis and coronary artery bypass graft times one with  the left internal mammary artery to the left anterior descending coronary  artery.   SURGEON:  Gwenith Daily. Tyrone Sage, M.D.   ASSISTANT:  Jody P. Diamond Nickel.   INDICATIONS FOR PROCEDURE:  The patient is a 70 year old female with a  several year history of aortic murmur and known aortic stenosis.  She has  been followed medically but over the past six months had noted increasing  shortness of breath, especially with exertion such as climbing stairs.  Because of the progression of these symptoms she was re-evaluated by Dr.  Verdis Prime with echocardiogram and cardiac catheterization.  Depending on  the means of measurement, the aortic valve area was estimated at between 0.8  and 1.2.  Because of the progression of the patient's symptoms of  CHF with  exertion, and also because of episodes lightheadedness with exertion,  cardiac catheterization was performed confirming the diagnosis of critical  aortic.  In addition the patient had some degree of coronary occlusive  disease with a 70% to 80% stenosis of a first diagonal at the take off from  the LAD.  In the vicinity of the LAD, there was also 60% stenosis of the  LAD.  The diagonal was small and not bypassed.  Because  of the stenosis in  the LAD, the left internal mammary artery was placed to the LAD.  Various  aortic valve substitutes were discussed with the patient and she was  agreeable with taking Coumadin, and agreed to proceeding with St. Jude  aortic valve replacement.   DESCRIPTION OF PROCEDURE:  With Theone Murdoch and arterial line monitors in  place.  The patient underwent general endotracheal anesthesia without  incident.  The skin of the chest and legs was prepped with Betadine and  draped in the usual sterile manner.  A median sternotomy was performed and  the left internal mammary artery was dissected down as a pedicle graft. The  distal artery was divided and had good free flow.  The pericardium was  opened.  The patient had evidence of left ventricular hypertrophy.  There  was an easily palpable thrill over the ascending aorta.  She was  systemically heparinized.  The ascending  aorta and the right atrium were  cannulated.  An aortic root bent cardioplegia needle was introduced into the  ascending aorta.  The left internal mammary artery had been dissected down  as a pedicle graft and had good free flow.  The patient was placed on  cardiopulmonary bypass at 2.4L per minutes per meter squared.  The site of  anastomosis was dissected out of the epicardium.  The patient's body  temperature was cooled to 28 degrees.  A right superior pulmonary vein vent  was placed.  An aortic crossclamp was applied and 500 cc of cold blood  potassium cardioplegia was administered with rapid diastolic arrest of the  heart.  Attention was turned first to the left anterior descending coronary  artery which was opened and the left internal mammary artery was anastomosed  to the left anterior descending coronary artery with a running 8-0 Prolene.  The fascia was tacked to the epicardium.  Additional cold blood cardioplegia  was administered in the aortic root.  Attention was then turned to the  aortic valve.  A  transverse aortotomy was performed.  this gave good  visualization of a tricuspid calcified valve with significant calcification  throughout all three leaflets and with calcification extending into the  aortic wall along the noncoronary cusp and in the vicinity of the conduction  system.  The valve was excised and the annulus debrided.  Care was taken to  remove all calcific debris.  The valve was incised for a 21 regent St. Jude  mitral valve prosthesis, model number [id number], serial number [id number].  The valve was then secured in place with #2 pledgeted Tycron sutures with  the pledgets on the ventricular surface.  The valve seated well and the  leaflets had free movement.  Again inspection for any loose calcific debris  was undertaken.  The aortotomy was then closed with a horizontal mattress 3-  0 Prolene suture over felt strips.  Before complete closure, the patient's  head was put in the down position. Heart was allowed to passively fill and  deair.  The aortic crossclamp was removed with a total crossclamp time of  103 minutes.  A 16 gauge needle was introduced in the left ventricular apex  to further deair the heart.  The bulldog on the mammary artery was removed  with prompt rise in myocardial septal temperature.  The patient required  electrical defibrillation to return into a sinus rhythm.  With the body  temperature rewarmed to 37 degrees, the right superior pulmonary vein vent  was removed.  The patient was then ventilated and weaned from  cardiopulmonary bypass without difficulty.  The aortic root vent was  removed.  The patient was decannulated in the usual fashion.  Protamine  sulfate was administered.  With the operative field hemostatic, two atrial  and two ventricular pacing wires were applied.  The pericardium was loosely  reapproximated.  The left pleural tube and two mediastinal tubes were left  in place.  The sternum was closed with #6 stainless steel wires.   Fascia closed with interrupted 0 Vicryl, running 3-0 Vicryl in the subcutaneous  tissues and 4-0 subcuticular stitch in the skin edges.  Dry dressing was  applied.  Sponge and needle counts were reported as correct at the  completion of the procedure.  The patient tolerated the procedure without  complication and was transferred to the surgical intensive care unit for  further postoperative care.  Total pump time was 105 minutes.  Gwenith Daily Tyrone Sage, M.D.    Tyson Babinski  D:  09/19/2001  T:  09/24/2001  Job:  [id number]   cc:   Lesleigh Noe, M.D.

## 2010-07-08 NOTE — Discharge Summary (Signed)
Paula Ferguson, Paula NO.:  [id number]   MEDICAL RECORD NO.:  [id number]          PATIENT TYPE:  INP   LOCATION:  5031                         FACILITY:  [facility]   PHYSICIAN:  [name]. [name], M.D. DATE OF BIRTH:  1940/09/29   DATE OF ADMISSION:  05/29/2008  DATE OF DISCHARGE:  06/01/2008                               DISCHARGE SUMMARY   ADMITTING DIAGNOSIS:  Right shoulder pain following shoulder  hemiarthroplasty.   POSTOPERATIVE DIAGNOSIS:  Right shoulder pain following shoulder  hemiarthroplasty.   PROCEDURE PERFORMED:  Right shoulder conversion to total shoulder  replacement/revision shoulder arthroplasty performed on May 29, 2008,  by Dr. [name].   CONSULTING SERVICES:  Occupational therapy and physical therapy.   HISTORY OF PRESENT ILLNESS:  The patient is a 70 year old female status  post shoulder arthroplasty 1 year ago.  The patient had a  hemiarthroplasty performed which initially did well.  The patient  developed worsening pain in her shoulder over the last several months.  There was evidence that the patient had pain related to persistence of  stiffness in her shoulder following surgery in addition to possible pain  coming from her glenoid.  After counseling the patient regarding options  for treatment, we elected to proceed with conversion to total shoulder  arthroplasty.  Informed consent was obtained.  The patient was admitted  for surgery.  For further details and the patient's past medical history  and physical examination, please see the hospital record.   HOSPITAL COURSE:  The patient was admitted to Orthopedics.  On May 29, 2008, she was taken to the surgery for conversion of her  hemiarthroplasty to total shoulder replacement.  The patient tolerated  the surgery well, was taken to the postoperative floor where she was  stable.  The patient had occupational therapy, physical therapy for  mobilization, and instruction and Codman  exercises, active risk in elbow  range of motion as well as ADLs.  The patient's wound was healing with  no signs of infection prior to discharge.  She is discharged home.  Minimal weightbearing but allow to do ADLs with her arm on the right  side.  She will be instructed to follow up in Orthopedics in 2 weeks.  She is discharged on Percocet and Robaxin for pain as well as start her  back on Coumadin, that will be managed as an outpatient and she is on  Lovenox crossover for that, and she is on a regular diet as well.      [name]. [name], M.D.  Electronically Signed     SRN/MEDQ  D:  06/10/2008  T:  06/11/2008  Job:  [id number]

## 2010-07-08 NOTE — Discharge Summary (Signed)
NAME:  Paula Ferguson, Paula Ferguson NO.:  [id number]   MEDICAL RECORD NO.:  [id number]                   PATIENT TYPE:  INP   LOCATION:  2039                                 FACILITY:  [facility]   PHYSICIAN:  Eber Hong, P.A.           DATE OF BIRTH:  11/29/1940   DATE OF ADMISSION:  09/18/2001  DATE OF DISCHARGE:  09/23/2001                           DISCHARGE SUMMARY - REFERRING   ADMISSION DIAGNOSES:  1. Progressive symptomatic aortic stenosis.  2. Hypertension.  3. Hyperparathyroidism.   DISCHARGE DIAGNOSES:  1. Progressive symptomatic aortic stenosis.  2. Hypertension.  3. Hyperparathyroidism.   PROCEDURES:  Aortic valve replacement with #21 St. Jude mechanical valve:  Coronary artery bypass graft x 1 with left internal mammary to the LAD  August 22, 2001.   BRIEF HISTORY:  The patient is a 70 year old white female, medical patient  of Dr. Lyn Records and Dr. Gordy Savers, who has been followed  for longstanding aortic stenosis.  The patient denies any definite anginal  symptoms.  She has had no pedal edema.  No PND or syncope and only mild  shortness of breath on exertion;  however, she has noted a four to six-month  history of lightheadedness, especially when exerting herself and carrying  laundry up the stairs from the basement.  Because of the symptoms, repeat  echocardiogram was performed followed by cardiac catheterization.   PAST SURGICAL HISTORY:  Laparoscopic cholecystectomy in 1998 and abdominal  hysterectomy in 1981.  Multiple knee, ankle and wrist surgeries.  For  further history and physical, please see the dictated note.   ALLERGIES:  MORPHINE and DILAUDID prompts itching.   CURRENT MEDICATIONS:  1. Synthroid 0.5 mg q.d.  2. Premarin 0.25 mg every three days.  3. Verapamil 100 mg q.d.  4. Hydrochlorothiazide 25 mg q.d.  5. Darvocet p.r.n.  6. Aspirin 81 mg q.d.   HOSPITAL COURSE:  The patient was admitted and taken  to the operating room  and underwent coronary artery bypass graft along with left internal mammary  artery to the LAD bypass.  She tolerated the procedure well.  She came off  bypass and transferred to the ICU.  She was extubated later that evening.  Hematocrit was down to 22.8 with hemoglobin of 7.6.  She was given a unit of  packed cells.   The first postoperative morning, she was in sinus rhythm.  Cardiac index was  2.73.  She was started on Coumadin and transferred to the floor.  She was  followed on a daily basis by Dr. Lyn Records, who helped with her  management.  She continued to do well.  She ultimately got to the floor at  the end of her second postoperative day.   The third postoperative morning, temperature maximum was 100.6.  Blood  pressure was 130 to 140 over 60 to 80.  Her weight was 154 pounds up from a  preoperative weight of 141.  She was placed on Coumadin and received two  doses of 5 on July 31 and September 20, 2001.  On September 21, 2001, she received  2.5 mg.   She has continued to make progress.  She is ambulating without difficulty.  She can drink, walk, and void without problems.  She still has not become  therapeutic on Coumadin.  Her potassium was low on September 21, 2001 and this  was replaced.  Hemoglobin was 10.3, hematocrit was 8.7, white count of 6.6,  and platelets were 133,000.   On the fourth postoperative day, she again was quite good.  Her O2  saturations were 96% on room air.  Her weight was 149 with a target of a  preoperative weight of 141.  Electrolytes were normal.  Potassium had been  successfully replaced.  Her chest was clear.  Her incisions all looked good.  The only question was whether she could stay here until she was completely  anticoagulated.  That decision was being made either later today or early in  the morning.  She currently has a subcuticular closure and the incisions are  healing nicely.  The sternum is quite stable.   DISCHARGE  MEDICATIONS:  1. Synthroid 0.5 mg q.d.  2. Premarin 0.25 mg every three days.  3. Hydrochlorothiazide 25 mg q.d.  4. Lopressor 25 mg b.i.d.  5. Potassium 20 mEq q.d.  6. Calan 120 mg 1 q.d.  7. Coumadin dose to be determined prior to discharge.  8. Darvocet 1-2 p.o. q.4h. p.r.n.  9. She can resume her trazodone at home also at 100 mg p.o. h.s.   FOLLOW UP:  She will follow up with Dr. Lyn Records on Wednesday or  Thursday this coming week.  She will check her pro time.  She will see Dr.  Lyn Records in two weeks and Dr. Gwenith Daily. Gerhardt in three weeks with a  chest x-ray from Dr. Barry Dienes. Smith's office.   CONDITION ON DISCHARGE:  Improving.                                                    Eber Hong, P.A.    WDJ/MEDQ  D:  09/22/2001  T:  09/27/2001  Job:  [id number]   cc:   Lesleigh Noe, M.D.   Gordy Savers, M.D. [facility]

## 2010-07-08 NOTE — H&P (Signed)
Paula, Ferguson NO.:  [id number]   MEDICAL RECORD NO.:  [id number]          PATIENT TYPE:  INP   LOCATION:  NA                           FACILITY:  [facility]   PHYSICIAN:  Madlyn Frankel. Charlann Boxer, M.D.  DATE OF BIRTH:  Oct 27, 1940   DATE OF ADMISSION:  02/15/2006  DATE OF DISCHARGE:                              HISTORY & PHYSICAL   PROCEDURE:  Left unicompartmental knee arthroplasty.   CHIEF COMPLAINT:  Left knee pain.   HISTORY OF PRESENT ILLNESS:  This is a very pleasant 70 year old female  with a history of multiple surgeries and persistent knee pain secondary  to osteoarthritis in the medial compartment.  All conservative measures  have failed to provide pain relief.  She has had prior knee  arthroscopies on both of her knees since 1985 and, since that time,  conservative measures have failed to relieve pain which include over-the-  counter anti-inflammatories, corticosteroid injections.  At this point,  since she does have mainly medial compartment arthritis, she has elected  to proceed for the partial knee replacement.   PAST MEDICAL HISTORY:  1. Hypertension.  2. Cholecystectomy.  3. Osteoarthritis.  4. Hypothyroidism.  5. Ovarian cysts.   PAST SURGICAL HISTORY:  1. Tonsillectomy.  2. C-section, 1971, 1973.  3. Excision of cyst posterior right thigh.  4. D&C, 1981.  5. Hysterectomy.  6. Appendectomy.  7. Ovarian cystectomy in 1981.  8. Excision lipoma, right knee, right wrist, right ankle, 1982.  9. Salpingo-oophorectomy, 1982.  10.Left knee arthroscopic surgery, 1985.  11.Right knee arthroscopic surgery, 1985.  12.Left carpal tunnel release, 1988.  13.Right carpal tunnel release, 1988.  14.Right foot multiple operations, 1989, 1990.  15.Fusion foot joints, 1991.  16.Subtalar fusion, right ankle, 1992.  17.Removed pin, 1992.  18.Cholecystectomy, 1999.  19.Open heart surgery and aortic valve replacement, 2003.  20.Right knee arthroscopy, 2007.  21.Left shoulder scope, 2007.   FAMILY HISTORY:  Heart disease, hypertension, cancer.   SOCIAL HISTORY:  The patient is widowed, retired.  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  The caregiver will be daughter and  friends afterwards.   DRUG ALLERGIES:  DEMEROL.   MEDICATIONS:  1. Lipitor 20 mg 1 p.o. daily.  2. Verapamil 240 mg 1 p.o. daily.  3. Synthroid 20 mg 1 p.o. daily.  4. Coumadin 5 mg 1 p.o. daily.  She is tapering off her Coumadin,      beginning on December 22.  We will use Lovenox as a bridge until      time of surgery and then after surgery, she will begin to take      Coumadin again.  5. Wellbutrin 150 mg 1 p.o. daily.  6. Trazodone 100 mg q.h.s. p.r.n.  7. Avalide 300/12.5 mg 1 p.o. daily.   REVIEW OF SYSTEMS:  No new signs or symptoms of any cardiovascular,  respiratory, gastrointestinal, genitourinary, neurologic,  musculoskeletal system complaints.   PHYSICAL EXAMINATION:  VITAL SIGNS:  Temperature 99.1, pulse 96,  respirations 18, blood pressure 154/90.  GENERAL:  She is awake, alert, and oriented.  No acute distress.  NECK:  Supple.  No carotid bruits.  CHEST:  Lungs clear to auscultation bilaterally.  BREASTS:  Deferred.  HEART:  Regular rate and rhythm with a systolic click due to the aortic  valve replacement.  ABDOMEN:  Soft, nontender, nondistended.  Bowel sounds present in all 4  quadrants.  GENITOURINARY:  Deferred.  EXTREMITIES:  She has full extension and flexion of her left knee.  No  signs cellulitis.  Dorsalis pedis pulse is positive.  NEUROLOGIC:  Intact sensibilities distally to left knee.   LABORATORY DATA:  Pending [facility] presurgical appointment.   X-RAYS:  Reviewed.  Chest x-ray pending.   IMPRESSION:  Left medial knee osteoarthritis.   PLAN OF ACTION:  Left unicompartmental knee arthroplasty to be performed  by surgeon, Dr. Durene Romans, on February 15, 2006, at [facility].  Risks, complications  were discussed.  Questions were  encouraged, answered, and reviewed.     ______________________________  Yetta Glassman [address], [place]      Madlyn Frankel. Charlann Boxer, M.D.  Electronically Signed    BLM/MEDQ  D:  02/09/2006  T:  02/09/2006  Job:  [id number]

## 2010-07-08 NOTE — H&P (Signed)
[place]  Patient:    Paula Ferguson, Paula Ferguson Visit Number: [id number] MRN: [id number]          Service Type: CAT Location: [facility] [id number] Attending Physician:  Lyn Records. Iii Dictated by:   Anselm Lis, N.P. Admit Date:  07/29/2001 Discharge Date: 07/29/2001   CC:         Gordy Savers, M.D. [facility]  Gretta Cool, M.D.  [facility] on [employer]   History and Physical  PHYSICIANS:  Primary care Ema Hebner - Gordy Savers, M.D.; OB/GYN - Gretta Cool, M.D.  DATE OF BIRTH:  06-Nov-1940  IMPRESSION:  (as dictated by Dr. Verdis Prime) 1. Symptoms of progressive lightheadedness/presyncope with minimal exertion in    this 70 year old female with known severe aortic stenosis of a congenital    bicuspid valve. 2. History of hypothyroidism; supplemented. 3. History of hypertension; medical management. 4. Chronic orthopedic problems with prior multiple surgery on her knees,    wrists, and ankles.  PLAN:  (as dictated by Dr. Verdis Prime) Patient counselled to undergo and accepted plans for coronary angiography, right heart catheterization to assess for degree of aortic stenosis and for coronary artery disease in anticipation of probable aortic valve replacement.  Risks, potential complications, benefits, and alternatives to procedure were discussed.  The patient and her daughter indicate their questions and concerns have been addressed and are agreeable to proceed.  HISTORY OF PRESENT ILLNESS:  The patient is a very pleasant 70 year old female with history of known congenital aortic stenosis followed by serial echoes over the years.  She had been asymptomatic but over the last four months has developed symptoms of lightheadedness with moderate exertion such as carrying clothes and a laundry basket up the stairs, but over the last four weeks these symptoms have progressed so that she can even feel presyncopal with  running around doing her household chores within the first 15 minutes or with just simply walking up the stairs without carrying items.  Recent 2-D echo revealed aortic stenosis and she has been counselled to undergo cardiac catheterization right and left heart towards preoperative clearance for aortic valve replacement.  PREVIOUS MEDICAL HISTORY:  As above.  Denies history of diabetes mellitus, cancer, asthma, nor GERD.  PREVIOUS SURGICAL HISTORY:  Laparoscopic cholecystectomy in 1998.  In 1981 she had an abdominal hysterectomy with BSO and incidental appendectomy.  History of multiple knee, ankle, and wrist surgeries, as well as attachment of her right index and middle finger secondary to trauma.  She has also had tonsillectomy.  ALLERGIES:  DEMEROL causing pruritus.  Okay with seafood, shellfish, and iodinated products.  MEDICATIONS: 1. Synthroid 0.05 mg p.o. q.d. 2. Premarin 0.25 mg p.o. every three days. 3. Verapamil 120 mg p.o. q.d. 4. Hydrochlorothiazide 25 mg p.o. q.d. 5. Darvocet-N 100 p.r.n. 6. Enteric-coated baby aspirin 81 mg per day. 7. Vitamin E, ginkgo, multivitamin q.d.  SOCIAL HISTORY AND HABITS:  ETOH/tobacco:  Negative.  The patient was widowed in 1997.  She has two twin daughters, one of whom suffered a motor vehicle accident in 2001 and had to have a lower extremity amputation for which she wears a prosthesis, is doing okay.  Another daughter who is older, age 24, alive and well.  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  She is currently not working.  FAMILY HISTORY:  Father died age 63 of lung cancer.  Mother died age 88 of lung cancer, also had hypertension.  The patient has a twin sister in good health.  She has a brother age 45 who has kidney problems.  REVIEW OF SYSTEMS:  As in HPI/previous medical history.  Otherwise, denies problems with dysphagia.  Good  dentition.  GI/GU:  Negative.  Does have arthritic-type complaints in her hands which are deformed from arthritis.  PHYSICAL EXAMINATION:  (as performed by Dr. Verdis Prime)  VITAL SIGNS:  Blood pressure 152/83, heart rate 70.  She is afebrile. Respiratory rate 12.  She is 5 feet 4.5 inches, weighs 140 pounds.  GENERAL:  She is a slender, pleasantly conversant middle-aged female in no acute distress.  Her daughter is in attendance.  HEENT:  Brisk bilateral carotid upstroke with referred bilateral bruit.  No significant JVD.  CARDIAC:  Regular rate and rhythm with a grade 3-4/6 systolic murmur loudest right upper sternal border.  There is radiation to upper abdomen and left lateral.  No rub nor gallop.  CHEST:  Lung sounds with soft bibasilar crackles.  Negative CPA tenderness.  ABDOMEN:  Soft, nondistended.  Normal active bowel sounds.  Negative abdominal aortic, renal, nor femoral bruit.  Nontender to applied pressure.  No masses nor organomegaly appreciated.  EXTREMITIES:  Distal pulses intact except right wrist has 0/4 pulses.  NEUROLOGIC:  Cranial nerves II-XII grossly intact.  Alert and oriented x 3.  GENITAL AND RECTAL:  Deferred.  LABORATORY TESTS AND DATA:  Sodium 138, potassium 4.8, chloride 99, CO2 33, BUN 13, creatinine 0.8, glucose 89.  LFTs within normal range.  Hemoglobin 14.3, hematocrit 42.4, platelets 241, wbcs 6.  PT 11.5, INR 0.98, PTT 32.  A 2-D echo from December 2002 revealed severe calcific aortic stenosis with progression of aortic valve stenosis to 0.9 sqcm and increasing gradient to 64.  Her most recent echo done May 2003 is not available at time of this dictation.  In December 2002, EF was 75% without regional wall motion abnormalities.  Chest x-ray from July 26, 2001 revealed no active disease.  EKG  from today revealed sinus bradycardia at 58 beats per minute with T wave inversion in III and aVF. Dictated by:   Anselm Lis, N.P. Attending  Physician:  Lyn Records. Iii DD:  07/29/01 TD:  07/30/01 Job: [id number]

## 2010-07-08 NOTE — Op Note (Signed)
Paula Ferguson, SHEETS NO.:  [id number]   MEDICAL RECORD NO.:  [id number]          PATIENT TYPE:  AMB   LOCATION:  ENDO                         FACILITY:  [facility]   PHYSICIAN:  Anselmo Rod, M.D.  DATE OF BIRTH:  Jul 19, 1940   DATE OF PROCEDURE:  11/16/2003  DATE OF DISCHARGE:                                 OPERATIVE REPORT   PROCEDURE PERFORMED:  Colonoscopy with snare polypectomy times one.   ENDOSCOPIST:  Charna Elizabeth, M.D.   INSTRUMENT USED:  Olympus video colonoscope.   INDICATIONS FOR PROCEDURE:  The patient is a 70 year old white female with a  history of an aortic valve replacement undergoing screening colonoscopy.  The patient has also had rectal bleeding which she attributes to her  hemorrhoids.   PREPROCEDURE PREPARATION:  Informed consent was procured from the patient.  The patient was fasted for eight hours prior to the procedure and prepped  with a bottle of magnesium citrate and a gallon of GoLYTELY the night prior  to the procedure.  She was also started on Lovenox and switched from  Coumadin to Lovenox as per Dr. Corky Sing recommendations four days prior  to the procedure and was advised to stay off all nonsteroidals for 7 to 10  days prior to the procedure.  Lovenox was held the night prior to the  procedure.  The patient received a gram of Ancef for bowel prophylaxis prior  to the procedure.   PREPROCEDURE PHYSICAL:  The patient had stable vital signs.  Neck supple.  Chest clear to auscultation.  S1 and S2 regular.  Abdomen soft with normal  bowel sounds.   DESCRIPTION OF PROCEDURE:  The patient was placed in left lateral decubitus  position and sedated with 60 mcg of fentanyl and 8 mg of Versed in slow  incremental doses.  Once the patient was adequately sedated and maintained  on low flow oxygen and continuous cardiac monitoring, the Olympus video  colonoscope was advanced from the rectum to the cecum.  Large internal  hemorrhoids  were seen on retroflexion.  A small sessile polyp was snared  from 20 cm.  There was some residual stool in the right colon and multiple  washes were done.  The appendicular orifice and ileocecal valve were clearly  visualized and photographed. There was no evidence of diverticulosis.  The  patient tolerated the procedure well without complications.   IMPRESSION:  1.  Prominent internal hemorrhoids.  2.  Small sessile polyp snared from 20 cm.  3.  Some residual stool in the right colon, multiple washes done.   RECOMMENDATIONS:  1.  Await pathology results.  2.  Avoid all nonsteroidals including aspirin for the next four weeks.  3.  Follow Dr. Sherilyn Cooter Smith's recommendations with regard to the Lovenox and      switch to Coumadin in the next week as advised by him.  4.  Outpatient followup in the next two weeks for further recommendations.      The patient is to call the office      immediately if she has any problems with rectal bleeding different  from      what she has experienced in the past after the procedure.  5.  Question surgical evaluation for hemorrhoidectomy in the near future.       JNM/MEDQ  D:  11/16/2003  T:  11/16/2003  Job:  [id number]   cc:   Gordy Savers, M.D. [facility]   Lyn Records III, M.D.  [address]  [address]  [place]  [place] [address]  Fax: [phone]

## 2010-07-08 NOTE — Discharge Summary (Signed)
NAMETAKESHA, STEGER NO.:  [id number]   MEDICAL RECORD NO.:  [id number]          PATIENT TYPE:  INP   LOCATION:  5009                         FACILITY:  [facility]   PHYSICIAN:  [name]. [name], M.D. DATE OF BIRTH:  11/25/1940   DATE OF ADMISSION:  03/15/2007  DATE OF DISCHARGE:  03/17/2007                               DISCHARGE SUMMARY   ADMITTING DIAGNOSIS:  Right shoulder osteoarthritis.   DISCHARGE DIAGNOSIS:  Right shoulder osteoarthritis.   PROCEDURE PERFORMED:  Right shoulder replacement on March 14, 2007.   ATTENDING SURGEON:  [name]. [name], M.D.   CONSULTING SERVICE:  Occupational therapy.   HISTORY OF PRESENT ILLNESS:  The patient is a 70 year old female with a  worsening right shoulder pain, secondary to arthritis.  The patient has  failed conservative management consisting of therapy, activity  modifications, anti-inflammatories, injections, as well as arthroscopic  debridement.  The patient presents now for operative treatment to  restore function, eliminate pain.  For further details of the patient's  past medical history and physical examination, please see the medical  record.   HOSPITAL COURSE:  The patient admitted to the orthopedic surgery service  March 15, 2007, take to surgery where she had underwent an  uncomplicated shoulder hemiarthroplasty.  The patient had an  uncomplicated postoperative course, remaining afebrile, tolerating a  regular diet well. She was working with occupational therapy prior to  discharge.  The patient was started back on her Coumadin.  She was  scheduled for home health PT and OT and follow-up with orthopedics in 2  weeks.  She is discharged on previous medications, as well as Percocet  and Robaxin.      [name]. [name], M.D.  Electronically Signed     SRN/MEDQ  D:  04/04/2007  T:  04/06/2007  Job:  [id number]

## 2010-07-08 NOTE — Cardiovascular Report (Signed)
[facility]  Patient:    Paula Ferguson, Paula Ferguson Visit Number: [id number] MRN: [id number]          Service Type: CAT Location: [facility] 2899 [id number] Attending Physician:  Lyn Records. Iii Dictated by:   Darci Needle, M.D. Proc. Date: 07/29/01 Admit Date:  07/29/2001 Discharge Date: 07/29/2001   CC:         Gordy Savers, M.D. [facility]  Gwenith Daily. Tyrone Sage, M.D.   Cardiac Catheterization  INDICATIONS:  Aortic stenosis with symptoms potentially related.  PROCEDURE PERFORMED: 1. Left heart catheterization. 2. Right heart catheterization. 3. Coronary angiography. 4. Left ventriculography. 5. Aortography.  DESCRIPTION OF PROCEDURE:  After informed consent, a #7 French sheath was placed in the right femoral vein using the modified Seldinger technique.  A #6 French sheath was placed in the right femoral artery.  The patient then underwent right and left heart catheterization using a #7 French Swan-Ganz catheter, a #6 [place] A2 multipurpose catheter, and #6 French left and right Judkins catheters.  Thermodilution cardiac outputs were recorded.  Hemodynamic recordings and calculations were performed using the hemodynamic software in the [place] system.  The patient tolerated the procedure without complications.  RESULTS:   I. Hemodynamic data      A. Aortic pressure 180/91.      B. Left ventricular pressure 208/20 mmHg.      C. Aortic valve gradient 21.2 is the average gradient used for the         calculation made with the thermodilution output and 20.5 mmHg is the         average valve area using the Fick output.  Thermodilution cardiac         output:  5.4 liters/minute.  Fick cardiac output:  3.88 liters/minute.      D. Right atrial mean pressure 4 mmHg.      E. Right ventricular pressure 27/6.      F. Pulmonary artery pressure 28/16.      G. Pulmonary capillary wedge mean pressure 11.      H. Aortic valve area by thermodilution:  Cardiac  output 1.33 cm sq.         Aortic valve area by Fick cardiac output:  0.89 cm sq.   II. Left ventriculography:  The left ventricle was normal in size and      demonstrates normal overall contractility.  EF was 60-70%.  III. Aortography:  The aortic root is mildly dilated.  There is less than 1+      aortic regurgitation noted.   IV. Coronary angiography      A. Left main coronary:  Normal.      B. Left anterior descending coronary:  The mid LAD near the origin of         the second diagonal contains an eccentric 50% stenosis.  The second         diagonal contains 70% narrowing.      C. Circumflex artery:  Normal.      D. Right coronary:  Normal.  CONCLUSIONS: 1. Moderate to severe aortic stenosis, depending upon the cardiac output used    to perform the calculations with the valve area between 0.89 cm sq and    1.33 cm sq. 2. Normal left ventricular function. 3. Normal pulmonary artery pressures. 4. Mild to moderate left anterior descending artery disease/diagonal disease. 5. Trace aortic regurgitation.  RECOMMENDATIONS:  Clinical followup, serial echocardiograms, contemplate referral for surgical evaluation if the patients  symptoms progress. Dictated by:   Darci Needle, M.D. Attending Physician:  Lyn Records. Iii DD:  07/29/01 TD:  07/30/01 Job: [id number]

## 2010-08-15 ENCOUNTER — Other Ambulatory Visit: Payer: Self-pay | Admitting: Internal Medicine

## 2010-08-15 ENCOUNTER — Telehealth: Payer: Self-pay

## 2010-08-15 ENCOUNTER — Telehealth: Payer: Self-pay | Admitting: Internal Medicine

## 2010-08-15 MED ORDER — HYDROCODONE-ACETAMINOPHEN 5-500 MG PO TABS: 1.0000 | ORAL_TABLET | Freq: Four times a day (QID) | ORAL | Status: AC | PRN

## 2010-08-15 NOTE — Telephone Encounter (Signed)
Fax refill request for vicodin [phone] . Last written 03/08/2010 #90 2RF - pt has appt next month Please advise

## 2010-08-15 NOTE — Telephone Encounter (Signed)
[facility] 

## 2010-08-15 NOTE — Telephone Encounter (Signed)
noted 

## 2010-08-15 NOTE — Telephone Encounter (Signed)
#  90  RF 3 

## 2010-08-15 NOTE — Telephone Encounter (Signed)
Pt called, stated she has appt with Dr. Kirtland Bouchard on 7/18 but would need several refills before then. She is calling the pharmacy, but wanted you to be aware.

## 2010-08-31 ENCOUNTER — Encounter: Payer: Self-pay | Admitting: Internal Medicine

## 2010-09-06 ENCOUNTER — Encounter: Payer: Self-pay | Admitting: Internal Medicine

## 2010-09-06 ENCOUNTER — Ambulatory Visit (INDEPENDENT_AMBULATORY_CARE_PROVIDER_SITE_OTHER): Payer: Medicare Other | Admitting: Internal Medicine

## 2010-09-06 DIAGNOSIS — I2510 Atherosclerotic heart disease of native coronary artery without angina pectoris: Secondary | ICD-10-CM

## 2010-09-06 DIAGNOSIS — Z862 Personal history of diseases of the blood and blood-forming organs and certain disorders involving the immune mechanism: Secondary | ICD-10-CM

## 2010-09-06 DIAGNOSIS — I10 Essential (primary) hypertension: Secondary | ICD-10-CM

## 2010-09-06 DIAGNOSIS — E039 Hypothyroidism, unspecified: Secondary | ICD-10-CM

## 2010-09-06 DIAGNOSIS — D649 Anemia, unspecified: Secondary | ICD-10-CM

## 2010-09-06 LAB — CBC WITH DIFFERENTIAL/PLATELET
Eosinophils Absolute: 0.1 10*3/uL (ref 0.0–0.7)
Eosinophils Relative: 1.6 % (ref 0.0–5.0)
HCT: 36.4 % (ref 36.0–46.0)
Lymphs Abs: 0.8 10*3/uL (ref 0.7–4.0)
Monocytes Absolute: 0.4 10*3/uL (ref 0.1–1.0)
Monocytes Relative: 12.6 % — ABNORMAL HIGH (ref 3.0–12.0)
Neutrophils Relative %: 61.8 % (ref 43.0–77.0)
Platelets: 164 10*3/uL (ref 150.0–400.0)
RDW: 14.5 % (ref 11.5–14.6)
WBC: 3.4 10*3/uL — ABNORMAL LOW (ref 4.5–10.5)

## 2010-09-06 LAB — HEPATIC FUNCTION PANEL
ALT: 69 U/L — ABNORMAL HIGH (ref 0–35)
AST: 110 U/L — ABNORMAL HIGH (ref 0–37)
Albumin: 3.8 g/dL (ref 3.5–5.2)
Alkaline Phosphatase: 189 U/L — ABNORMAL HIGH (ref 39–117)
Total Protein: 8.1 g/dL (ref 6.0–8.3)

## 2010-09-06 NOTE — Patient Instructions (Signed)
Limit your sodium (Salt) intake    It is important that you exercise regularly, at least 20 minutes 3 to 4 times per week.  If you develop chest pain or shortness of breath seek  medical attention.  Return in 6 months for follow-up  

## 2010-09-06 NOTE — Progress Notes (Signed)
  Subjective:    Patient ID: Paula Ferguson, female    DOB: April 08, 1940, 70 y.o.   MRN: [id number]  HPI and 70 year old patient who has a history of significant osteoarthritis. She is status post shoulder replacement surgery in March of this year since her surgery she has had some chronic fatigue she does have a history of hypothyroidism and there has been no recent TSH she also has a history of some elevated liver function studies in the past postoperatively she required 2 units of packed RBCs she remains on iron supplements. She denies any cardiopulmonary complaints. She does have a history of coronary artery disease and is status post aortic valve prosthetic replacement. She remains on chronic Coumadin anticoagulation      Review of Systems  Constitutional: Positive for fatigue.  HENT: Negative for hearing loss, congestion, sore throat, rhinorrhea, dental problem, sinus pressure and tinnitus.   Eyes: Negative for pain, discharge and visual disturbance.  Respiratory: Negative for cough and shortness of breath.   Cardiovascular: Negative for chest pain, palpitations and leg swelling.  Gastrointestinal: Negative for nausea, vomiting, abdominal pain, diarrhea, constipation, blood in stool and abdominal distention.  Genitourinary: Negative for dysuria, urgency, frequency, hematuria, flank pain, vaginal bleeding, vaginal discharge, difficulty urinating, vaginal pain and pelvic pain.  Musculoskeletal: Positive for arthralgias. Negative for joint swelling and gait problem.  Skin: Negative for rash.  Neurological: Positive for weakness. Negative for dizziness, syncope, speech difficulty, numbness and headaches.  Hematological: Negative for adenopathy.  Psychiatric/Behavioral: Negative for behavioral problems, dysphoric mood and agitation. The patient is not nervous/anxious.        Objective:   Physical Exam  Constitutional: She is oriented to person, place, and time. She appears well-developed  and well-nourished.  HENT:  Head: Normocephalic.  Right Ear: External ear normal.  Left Ear: External ear normal.  Mouth/Throat: Oropharynx is clear and moist.  Eyes: Conjunctivae and EOM are normal. Pupils are equal, round, and reactive to light.  Neck: Normal range of motion. Neck supple. No thyromegaly present.  Cardiovascular: Normal rate, regular rhythm, normal heart sounds and intact distal pulses.        Prosthetic heart sounds Sternotomy scar  Pulmonary/Chest: Effort normal and breath sounds normal.  Abdominal: Soft. Bowel sounds are normal. She exhibits no mass. There is no tenderness.  Musculoskeletal: Normal range of motion.  Lymphadenopathy:    She has no cervical adenopathy.  Neurological: She is alert and oriented to person, place, and time.  Skin: Skin is warm and dry. No rash noted.  Psychiatric: She has a normal mood and affect. Her behavior is normal.          Assessment & Plan:  Fatigue History of postop anemia History elevated liver function studies History of hypothyroidism  We'll check screening labs CPX in 6 months

## 2010-09-08 ENCOUNTER — Other Ambulatory Visit: Payer: Self-pay | Admitting: Internal Medicine

## 2010-09-08 DIAGNOSIS — R945 Abnormal results of liver function studies: Secondary | ICD-10-CM

## 2010-09-08 NOTE — Progress Notes (Signed)
Quick Note:  Spoke with pt - informed of lab results and need to repeat in 4 wks - pt states she is drinking no alcohol. She will call and schedule lab appt in AM. KIK ______

## 2010-10-03 ENCOUNTER — Other Ambulatory Visit (INDEPENDENT_AMBULATORY_CARE_PROVIDER_SITE_OTHER): Payer: Medicare Other

## 2010-10-03 DIAGNOSIS — R945 Abnormal results of liver function studies: Secondary | ICD-10-CM

## 2010-10-03 LAB — HEPATIC FUNCTION PANEL
ALT: 57 U/L — ABNORMAL HIGH (ref 0–35)
Alkaline Phosphatase: 153 U/L — ABNORMAL HIGH (ref 39–117)
Bilirubin, Direct: 0.1 mg/dL (ref 0.0–0.3)
Total Bilirubin: 0.7 mg/dL (ref 0.3–1.2)

## 2010-10-06 ENCOUNTER — Encounter: Payer: Self-pay | Admitting: Internal Medicine

## 2010-10-07 ENCOUNTER — Telehealth: Payer: Self-pay | Admitting: Internal Medicine

## 2010-10-07 NOTE — Telephone Encounter (Signed)
Please advise 

## 2010-10-07 NOTE — Telephone Encounter (Signed)
Attempt to call- VM - LMTCB , gave dr. Amador Cunas instructions , please call Monday and let me know about lipitor and schedule lab appt. KIK

## 2010-10-07 NOTE — Telephone Encounter (Signed)
Pt would like results of labs from Monday please contact.

## 2010-10-07 NOTE — Telephone Encounter (Signed)
Notified patient that her liver function studies are still elevated although mildly improved. Has  the patient been off Lipitor??  If not, discontinue for 4 weeks and repeat liver function studies in one month

## 2010-10-11 ENCOUNTER — Encounter: Payer: Self-pay | Admitting: Internal Medicine

## 2010-10-15 ENCOUNTER — Other Ambulatory Visit: Payer: Self-pay | Admitting: Internal Medicine

## 2010-10-17 ENCOUNTER — Encounter: Payer: Self-pay | Admitting: Internal Medicine

## 2010-10-25 ENCOUNTER — Telehealth: Payer: Self-pay | Admitting: Internal Medicine

## 2010-10-25 DIAGNOSIS — E7800 Pure hypercholesterolemia, unspecified: Secondary | ICD-10-CM

## 2010-10-25 NOTE — Telephone Encounter (Signed)
ok 

## 2010-10-25 NOTE — Telephone Encounter (Signed)
Future lab placed  

## 2010-10-25 NOTE — Telephone Encounter (Signed)
Pt called back, and scheduled her repeat liver labs for 9/17. Can I get an order please? Thanks.

## 2010-11-07 ENCOUNTER — Other Ambulatory Visit: Payer: PRIVATE HEALTH INSURANCE

## 2010-11-07 ENCOUNTER — Other Ambulatory Visit: Payer: Self-pay | Admitting: Internal Medicine

## 2010-11-10 ENCOUNTER — Other Ambulatory Visit: Payer: PRIVATE HEALTH INSURANCE

## 2010-11-10 LAB — DIFFERENTIAL
Basophils Relative: 1
Eosinophils Absolute: 0.2
Eosinophils Relative: 2
Lymphs Abs: 1.5
Monocytes Relative: 9

## 2010-11-10 LAB — URINALYSIS, ROUTINE W REFLEX MICROSCOPIC
Bilirubin Urine: NEGATIVE
Glucose, UA: NEGATIVE
Hgb urine dipstick: NEGATIVE
Specific Gravity, Urine: 1.017
pH: 6

## 2010-11-10 LAB — BASIC METABOLIC PANEL
CO2: 27
CO2: 29
CO2: 31
Chloride: 101
Chloride: 104
Chloride: 98
Creatinine, Ser: 0.68
Creatinine, Ser: 0.89
GFR calc Af Amer: >60
GFR calc Af Amer: >60
GFR calc non Af Amer: >60
Glucose, Bld: 122 — ABNORMAL HIGH
Glucose, Bld: 87
Glucose, Bld: 92
Potassium: 3.1 — ABNORMAL LOW
Sodium: 134 — ABNORMAL LOW
Sodium: 139

## 2010-11-10 LAB — CBC
HCT: 26.6 — ABNORMAL LOW
HCT: 27.8 — ABNORMAL LOW
Hemoglobin: 13.1
Hemoglobin: 8.9 — ABNORMAL LOW
Hemoglobin: 9.3 — ABNORMAL LOW
MCHC: 33.3
MCV: 85.5
MCV: 86.2
RBC: 4.6
RDW: 16 — ABNORMAL HIGH
WBC: 7.5

## 2010-11-10 LAB — ABO/RH: ABO/RH(D): O POS

## 2010-11-10 LAB — POCT I-STAT 4, (NA,K, GLUC, HGB,HCT)
HCT: 32 — ABNORMAL LOW
Operator id: 219291

## 2010-11-10 LAB — TYPE AND SCREEN
ABO/RH(D): O POS
Antibody Screen: NEGATIVE

## 2010-11-10 LAB — PROTIME-INR: INR: 1.2

## 2010-11-11 ENCOUNTER — Telehealth: Payer: Self-pay | Admitting: Internal Medicine

## 2010-11-11 DIAGNOSIS — R52 Pain, unspecified: Secondary | ICD-10-CM

## 2010-11-11 NOTE — Telephone Encounter (Signed)
Pt called and is sch for labs on Mon 9/24 at 2pm. Pt is req to get test added for Rheumatoid Arth added to labs.

## 2010-11-13 ENCOUNTER — Other Ambulatory Visit: Payer: Self-pay | Admitting: Internal Medicine

## 2010-11-14 ENCOUNTER — Other Ambulatory Visit: Payer: PRIVATE HEALTH INSURANCE

## 2010-11-14 NOTE — Telephone Encounter (Signed)
Please advise 

## 2010-11-14 NOTE — Telephone Encounter (Signed)
Added to labs

## 2010-11-14 NOTE — Telephone Encounter (Signed)
Okay to add  RA titer

## 2010-11-29 ENCOUNTER — Other Ambulatory Visit: Payer: Self-pay | Admitting: Internal Medicine

## 2011-01-16 ENCOUNTER — Other Ambulatory Visit: Payer: Self-pay | Admitting: Internal Medicine

## 2011-01-27 ENCOUNTER — Other Ambulatory Visit: Payer: Self-pay | Admitting: Internal Medicine

## 2011-02-13 ENCOUNTER — Other Ambulatory Visit: Payer: Self-pay

## 2011-02-13 NOTE — Telephone Encounter (Signed)
Fax refill request for vicodin - last seen 09/06/10 last written 08/15/10 #90 3RF [facility] advise [facility] [address]

## 2011-02-15 MED ORDER — HYDROCODONE-ACETAMINOPHEN 5-500 MG PO TABS: 1.0000 | ORAL_TABLET | Freq: Four times a day (QID) | ORAL | Status: DC | PRN

## 2011-02-15 NOTE — Telephone Encounter (Signed)
Called in- #90 - must be seen for future refills- per dr. Amador Cunas

## 2011-02-15 NOTE — Telephone Encounter (Signed)
Ok; needs rov prior to further RF

## 2011-02-23 DIAGNOSIS — I2510 Atherosclerotic heart disease of native coronary artery without angina pectoris: Secondary | ICD-10-CM | POA: Diagnosis not present

## 2011-02-23 DIAGNOSIS — M25569 Pain in unspecified knee: Secondary | ICD-10-CM | POA: Diagnosis not present

## 2011-02-23 DIAGNOSIS — I10 Essential (primary) hypertension: Secondary | ICD-10-CM | POA: Diagnosis not present

## 2011-02-23 DIAGNOSIS — Z7901 Long term (current) use of anticoagulants: Secondary | ICD-10-CM | POA: Diagnosis not present

## 2011-02-23 DIAGNOSIS — Z954 Presence of other heart-valve replacement: Secondary | ICD-10-CM | POA: Diagnosis not present

## 2011-02-23 DIAGNOSIS — Z01810 Encounter for preprocedural cardiovascular examination: Secondary | ICD-10-CM | POA: Diagnosis not present

## 2011-02-23 DIAGNOSIS — M25519 Pain in unspecified shoulder: Secondary | ICD-10-CM | POA: Diagnosis not present

## 2011-03-08 ENCOUNTER — Encounter (HOSPITAL_COMMUNITY): Payer: Self-pay | Admitting: Pharmacy Technician

## 2011-03-14 ENCOUNTER — Encounter (HOSPITAL_COMMUNITY)
Admission: RE | Admit: 2011-03-14 | Discharge: 2011-03-14 | Disposition: A | Discharge status: Home / Self Care | Payer: Medicare Other | Source: Ambulatory Visit | Attending: Orthopedic Surgery | Admitting: Orthopedic Surgery

## 2011-03-14 ENCOUNTER — Ambulatory Visit (HOSPITAL_COMMUNITY)
Admission: RE | Admit: 2011-03-14 | Discharge: 2011-03-14 | Disposition: A | Discharge status: Home / Self Care | Payer: Medicare Other | Source: Ambulatory Visit | Attending: Orthopedic Surgery | Admitting: Orthopedic Surgery

## 2011-03-14 ENCOUNTER — Encounter (HOSPITAL_COMMUNITY): Payer: Self-pay

## 2011-03-14 DIAGNOSIS — Z01811 Encounter for preprocedural respiratory examination: Secondary | ICD-10-CM | POA: Diagnosis not present

## 2011-03-14 DIAGNOSIS — Z01812 Encounter for preprocedural laboratory examination: Secondary | ICD-10-CM | POA: Diagnosis not present

## 2011-03-14 DIAGNOSIS — Z01818 Encounter for other preprocedural examination: Secondary | ICD-10-CM | POA: Insufficient documentation

## 2011-03-14 DIAGNOSIS — E039 Hypothyroidism, unspecified: Secondary | ICD-10-CM | POA: Insufficient documentation

## 2011-03-14 DIAGNOSIS — I10 Essential (primary) hypertension: Secondary | ICD-10-CM | POA: Diagnosis not present

## 2011-03-14 DIAGNOSIS — E785 Hyperlipidemia, unspecified: Secondary | ICD-10-CM | POA: Diagnosis not present

## 2011-03-14 DIAGNOSIS — M1710 Unilateral primary osteoarthritis, unspecified knee: Secondary | ICD-10-CM | POA: Insufficient documentation

## 2011-03-14 DIAGNOSIS — I2510 Atherosclerotic heart disease of native coronary artery without angina pectoris: Secondary | ICD-10-CM | POA: Diagnosis not present

## 2011-03-14 DIAGNOSIS — Z951 Presence of aortocoronary bypass graft: Secondary | ICD-10-CM | POA: Diagnosis not present

## 2011-03-14 HISTORY — DX: Reserved for inherently not codable concepts without codable children: IMO0001

## 2011-03-14 HISTORY — DX: Encounter for other specified aftercare: Z51.89

## 2011-03-14 LAB — DIFFERENTIAL
Basophils Relative: 0 % (ref 0–1)
Eosinophils Absolute: 0.1 10*3/uL (ref 0.0–0.7)
Monocytes Relative: 9 % (ref 3–12)
Neutrophils Relative %: 68 % (ref 43–77)

## 2011-03-14 LAB — BASIC METABOLIC PANEL
BUN: 12 mg/dL (ref 6–23)
Calcium: 9.9 mg/dL (ref 8.4–10.5)
Creatinine, Ser: 0.69 mg/dL (ref 0.50–1.10)
GFR calc non Af Amer: 87 mL/min — ABNORMAL LOW (ref >90)
Glucose, Bld: 84 mg/dL (ref 70–99)
Potassium: 4 mEq/L (ref 3.5–5.1)

## 2011-03-14 LAB — CBC
Hemoglobin: 12.7 g/dL (ref 12.0–15.0)
MCH: 30.2 pg (ref 26.0–34.0)
MCHC: 33.2 g/dL (ref 30.0–36.0)

## 2011-03-14 LAB — URINALYSIS, ROUTINE W REFLEX MICROSCOPIC
Bilirubin Urine: NEGATIVE
Glucose, UA: NEGATIVE mg/dL
Ketones, ur: NEGATIVE mg/dL
Nitrite: NEGATIVE
pH: 5.5 (ref 5.0–8.0)

## 2011-03-14 LAB — URINE MICROSCOPIC-ADD ON

## 2011-03-14 LAB — PROTIME-INR: INR: 2.64 — ABNORMAL HIGH (ref 0.00–1.49)

## 2011-03-14 MED ORDER — CHLORHEXIDINE GLUCONATE 4 % EX LIQD: 60.0000 mL | Freq: Once | CUTANEOUS | Status: DC

## 2011-03-14 NOTE — Pre-Procedure Instructions (Signed)
[facility] obtained from patient and placed on chart regarding stopping coumadin po and beginning Lovonox- verbalizes understanding

## 2011-03-14 NOTE — Patient Instructions (Signed)
20 LANAI CONLEE  03/14/2011   Your procedure is scheduled on:  03/21/11   Tuesday    surgery [phone]  Report to [facility] at 0800 AM.  Call this number if you have problems the morning of surgery: [phone]   Deliah Goody [id number]   Remember:   Do not eat food:After Midnight.   Monday night  May have clear liquids:until Midnight .Monday night  Clear liquids include soda, tea, black coffee, apple or grape juice, broth.  Take these medicines the morning of surgery with A SIP OF WATER:  Wellbutrin, synthroid with sip water          Vicodin if needed with sip water  Do not wear jewelry, make-up or nail polish.  Do not wear lotions, powders, or perfumes. You may wear deodorant.  Do not shave 48 hours prior to surgery.  Do not bring valuables to the hospital.  Contacts, dentures or bridgework may not be worn into surgery.  Leave suitcase in the car. After surgery it may be brought to your room.  For patients admitted to the hospital, checkout time is 11:00 AM the day of discharge.             States has instructions regarding when to stop coumadin and start Lovonox  Patients discharged the day of surgery will not be allowed to drive home.  Name and phone number of your driver: daughter Special Instructions: CHG Shower Use Special Wash: 1/2 bottle night before surgery and 1/2 bottle morning of surgery.  Regular soap face and privates   Please read over the following fact sheets that you were given: MRSA Information

## 2011-03-15 NOTE — H&P (Signed)
Paula Ferguson is an 70 y.o. female.    Chief Complaint: right knee OA and pain   HPI: Pt is a 70 y.o. female complaining of right knee pain for many years. Pain had continually increased since the beginning. Pt had pain in both knees and had a UKR of the left knee in 2006, at that time she was told that she would have to have some kind of replacement of the right knee.  She was putting it off as much as possible, but feels that it is really effecting her quality of life. X-rays in the clinic show end-stage arthritic changes of the right knee. Pt has tried various conservative treatments which have failed to alleviate their symptoms, including occasional NSAIDs and steroid injections. She had a right knee arthroscopy in 2012, at which time she states it showed bone-on-bone changes. Various options are discussed with the patient. Risks, benefits and expectations were discussed with the patient. Patient understand the risks, benefits and expectations and wishes to proceed with surgery.   PCP:  KWIATKOWSKI,PETER FRANK, MD, MD  D/C Plans:  Home with HHPT  Post-op Meds:  Rx given for ASA, Robaxin, Iron, Colace and MiraLax. Post-op she will need to go back on her Coumadin with branching Lovenox  Tranexamic Acid:  Not to be given  PMH: Past Medical History  Diagnosis Date  . ANEMIA 09/15/2009  . HYPERLIPIDEMIA 02/07/2007  . HYPERTENSION 09/14/2006  . HYPOTHYROIDISM 09/14/2006  . LIVER FUNCTION TESTS, ABNORMAL, HX OF 01/12/2009  . OLECRANON BURSITIS, LEFT 08/26/2007  . OSTEOARTHRITIS 02/07/2007  . Blood transfusion   . CORONARY ARTERY DISEASE 08/26/2007    LOV Dr Smith 1/13 with EKG and clearance on chart    PSH: Past Surgical History  Procedure Date  . Cholecystectomy   . Abdominal hysterectomy   . Knee surgery   . Ankle surgery   . Wrist surgery   . Shoulder surgery   . Coronary artery bypass graft     with  aortic valve replacement 2003  . Cardiac valve replacement   . Cardiac  catheterization     2003  . Joint replacement     right shoulder arthroscopy, left partial knee replacement  . Tonsillectomy   . Colonoscopy     Social History:  reports that she has never smoked. She has never used smokeless tobacco. She reports that she does not drink alcohol or use illicit drugs.  Allergies:  Allergies  Allergen Reactions  . Hydromorphone Hcl Itching  . Meperidine Hcl Itching  . Morphine Sulfate Itching    Medications: No current facility-administered medications for this encounter.   Current Outpatient Prescriptions  Medication Sig Dispense Refill  . buPROPion (WELLBUTRIN SR) 150 MG 12 hr tablet Take 150 mg by mouth daily.       . Calcium Citrate-Vitamin D (CITRACAL PETITES/VITAMIN D PO) Take 2 tablets by mouth daily.      . cholecalciferol (VITAMIN D) 1000 UNITS tablet Take 1,000 Units by mouth daily.      . ferrous sulfate 325 (65 FE) MG tablet Take 325 mg by mouth daily with breakfast.       . HYDROcodone-acetaminophen (VICODIN) 5-500 MG per tablet Take 1 tablet by mouth every 6 (six) hours as needed. PAIN       . levothyroxine (SYNTHROID, LEVOTHROID) 100 MCG tablet Take 100 mcg by mouth daily.       . losartan (COZAAR) 100 MG tablet Take 100 mg by mouth daily.       .   Multiple Vitamins-Minerals (CENTRUM PO) Take 1 tablet by mouth daily.      . oxyCODONE-acetaminophen (PERCOCET) 5-325 MG per tablet Take 0.5 tablets by mouth every 4 (four) hours as needed. Pain       . traZODone (DESYREL) 100 MG tablet Take 100 mg by mouth at bedtime.       . verapamil (CALAN-SR) 240 MG CR tablet Take 240 mg by mouth.       . verapamil (CALAN-SR) 240 MG CR tablet Take 240 mg by mouth every morning.      . warfarin (COUMADIN) 5 MG tablet Take 5-7.5 mg by mouth daily. 7.5 MG ON Thursday AND 5 MG ALL OTHER DAYS       Facility-Administered Medications Ordered in Other Encounters  Medication Dose Route Frequency Provider Last Rate Last Dose  . DISCONTD: chlorhexidine  (HIBICLENS) 4 % liquid 4 application  60 mL Topical Once Katrina Brosh Scott Gaetana Kawahara, PA        Results for orders placed during the hospital encounter of 03/14/11 (from the past 48 hour(s))  APTT     Status: Abnormal   Collection Time   03/14/11  2:10 PM      Component Value Range Comment   aPTT 60 (*) 24 - 37 (seconds)   BASIC METABOLIC PANEL     Status: Abnormal   Collection Time   03/14/11  2:10 PM      Component Value Range Comment   Sodium 135  135 - 145 (mEq/L)    Potassium 4.0  3.5 - 5.1 (mEq/L)    Chloride 101  96 - 112 (mEq/L)    CO2 27  19 - 32 (mEq/L)    Glucose, Bld 84  70 - 99 (mg/dL)    BUN 12  6 - 23 (mg/dL)    Creatinine, Ser 0.69  0.50 - 1.10 (mg/dL)    Calcium 9.9  8.4 - 10.5 (mg/dL)    GFR calc non Af Amer 87 (*) >90 (mL/min)    GFR calc Af Amer >90  >90 (mL/min)   CBC     Status: Normal   Collection Time   03/14/11  2:10 PM      Component Value Range Comment   WBC 5.9  4.0 - 10.5 (K/uL)    RBC 4.20  3.87 - 5.11 (MIL/uL)    Hemoglobin 12.7  12.0 - 15.0 (g/dL)    HCT 38.2  36.0 - 46.0 (%)    MCV 91.0  78.0 - 100.0 (fL)    MCH 30.2  26.0 - 34.0 (pg)    MCHC 33.2  30.0 - 36.0 (g/dL)    RDW 13.1  11.5 - 15.5 (%)    Platelets 185  150 - 400 (K/uL)   DIFFERENTIAL     Status: Normal   Collection Time   03/14/11  2:10 PM      Component Value Range Comment   Neutrophils Relative 68  43 - 77 (%)    Neutro Abs 4.0  1.7 - 7.7 (K/uL)    Lymphocytes Relative 21  12 - 46 (%)    Lymphs Abs 1.3  0.7 - 4.0 (K/uL)    Monocytes Relative 9  3 - 12 (%)    Monocytes Absolute 0.5  0.1 - 1.0 (K/uL)    Eosinophils Relative 2  0 - 5 (%)    Eosinophils Absolute 0.1  0.0 - 0.7 (K/uL)    Basophils Relative 0  0 - 1 (%)    Basophils Absolute   0.0  0.0 - 0.1 (K/uL)   PROTIME-INR     Status: Abnormal   Collection Time   03/14/11  2:10 PM      Component Value Range Comment   Prothrombin Time 28.6 (*) 11.6 - 15.2 (seconds)    INR 2.64 (*) 0.00 - 1.49    URINALYSIS, ROUTINE W REFLEX  MICROSCOPIC     Status: Abnormal   Collection Time   03/14/11  3:20 PM      Component Value Range Comment   Color, Urine YELLOW  YELLOW     APPearance CLOUDY (*) CLEAR     Specific Gravity, Urine 1.025  1.005 - 1.030     pH 5.5  5.0 - 8.0     Glucose, UA NEGATIVE  NEGATIVE (mg/dL)    Hgb urine dipstick SMALL (*) NEGATIVE     Bilirubin Urine NEGATIVE  NEGATIVE     Ketones, ur NEGATIVE  NEGATIVE (mg/dL)    Protein, ur NEGATIVE  NEGATIVE (mg/dL)    Urobilinogen, UA 1.0  0.0 - 1.0 (mg/dL)    Nitrite NEGATIVE  NEGATIVE     Leukocytes, UA NEGATIVE  NEGATIVE    URINE MICROSCOPIC-ADD ON     Status: Normal   Collection Time   03/14/11  3:20 PM      Component Value Range Comment   RBC / HPF 0-2  <3 (RBC/hpf)    Crystals STARCH  NEGATIVE     Urine-Other MUCOUS PRESENT     SURGICAL PCR SCREEN     Status: Normal   Collection Time   03/14/11  3:21 PM      Component Value Range Comment   MRSA, PCR NEGATIVE  NEGATIVE     Staphylococcus aureus NEGATIVE  NEGATIVE     Dg Chest 2 View  03/14/2011  *RADIOLOGY REPORT*  Clinical Data: Preop x-ray  CHEST - 2 VIEW  Comparison: 06/18/2009  Findings: The heart size and mediastinal contours are within normal limits. Prior median sternotomy CABG procedure.  Both lungs are clear. Prior right shoulder arthroplasty.  IMPRESSION: No active cardiopulmonary abnormalities.  Original Report Authenticated By: TAYLOR H. STROUD, M.D.    ROS: Review of Systems  Constitutional: Negative.   HENT: Negative.   Eyes: Negative.   Respiratory: Negative.   Cardiovascular: Negative.   Gastrointestinal: Negative.   Genitourinary: Negative.   Musculoskeletal: Positive for myalgias and joint pain.  Skin: Negative.   Neurological: Negative.   Endo/Heme/Allergies: Negative.   Psychiatric/Behavioral: Negative.      Physican Exam: BP: 130/82 ; HR : 74 ; Resp: 16; Physical Exam  Constitutional: She is oriented to person, place, and time and well-developed, well-nourished,  and in no distress.  HENT:  Head: Normocephalic and atraumatic.  Nose: Nose normal.  Mouth/Throat: Oropharynx is clear and moist.  Eyes: Pupils are equal, round, and reactive to light.  Neck: Neck supple. No tracheal deviation present. No thyromegaly present.  Cardiovascular: Normal rate and regular rhythm.   Murmur (pronounced valve sound from previous surgery) heard. Pulmonary/Chest: Effort normal and breath sounds normal. No stridor. No respiratory distress. She has no wheezes. She has no rales. She exhibits no tenderness.  Abdominal: Soft. There is no tenderness. There is no guarding.  Musculoskeletal:       Right knee: She exhibits decreased range of motion (0-100, pain at extremes) and bony tenderness. She exhibits no swelling, no effusion, no ecchymosis, no deformity, no laceration and no erythema. tenderness found. Medial joint line tenderness noted.  Lymphadenopathy:      She has no cervical adenopathy.  Neurological: She is alert and oriented to person, place, and time.  Skin: Skin is warm and dry. No rash noted. No erythema. No pallor.  Psychiatric: Affect normal.      Assessment/Plan Assessment: right knee OA and pain   Plan: Patient will undergo a right total knee arthroplasty on 03/21/2011. Risks benefits and expectation were discussed with the patient. Patient understand risks, benefits and expectation and wishes to proceed.   Luisantonio Adinolfi S. Prezley Qadir   PAC  03/15/2011, 12:50 PM   

## 2011-03-21 ENCOUNTER — Encounter (HOSPITAL_COMMUNITY): Payer: Self-pay | Admitting: *Deleted

## 2011-03-21 ENCOUNTER — Emergency Department (HOSPITAL_COMMUNITY): Payer: Medicare Other | Admitting: *Deleted

## 2011-03-21 ENCOUNTER — Ambulatory Visit (HOSPITAL_COMMUNITY)
Admission: RE | Admit: 2011-03-21 | Discharge: 2011-03-21 | Disposition: A | Discharge status: Other Acute Inpatient Hospital | Payer: Medicare Other | Source: Ambulatory Visit | Attending: Orthopedic Surgery | Admitting: Orthopedic Surgery

## 2011-03-21 ENCOUNTER — Encounter (HOSPITAL_COMMUNITY): Payer: Self-pay

## 2011-03-21 ENCOUNTER — Emergency Department (HOSPITAL_COMMUNITY): Payer: Medicare Other

## 2011-03-21 ENCOUNTER — Inpatient Hospital Stay (HOSPITAL_COMMUNITY)
Admission: EM | Admit: 2011-03-21 | Discharge: 2011-03-24 | DRG: 470 | Disposition: A | Discharge status: Home Health | Payer: Medicare Other | Attending: Orthopedic Surgery | Admitting: Orthopedic Surgery

## 2011-03-21 ENCOUNTER — Encounter (HOSPITAL_COMMUNITY)
Admission: EM | Disposition: A | Discharge status: Home Health | Payer: Self-pay | Source: Home / Self Care | Attending: Orthopedic Surgery

## 2011-03-21 DIAGNOSIS — E039 Hypothyroidism, unspecified: Secondary | ICD-10-CM | POA: Diagnosis present

## 2011-03-21 DIAGNOSIS — IMO0002 Reserved for concepts with insufficient information to code with codable children: Principal | ICD-10-CM | POA: Diagnosis present

## 2011-03-21 DIAGNOSIS — S0100XA Unspecified open wound of scalp, initial encounter: Secondary | ICD-10-CM | POA: Diagnosis not present

## 2011-03-21 DIAGNOSIS — S0101XA Laceration without foreign body of scalp, initial encounter: Secondary | ICD-10-CM

## 2011-03-21 DIAGNOSIS — M1710 Unilateral primary osteoarthritis, unspecified knee: Secondary | ICD-10-CM | POA: Diagnosis not present

## 2011-03-21 DIAGNOSIS — E785 Hyperlipidemia, unspecified: Secondary | ICD-10-CM | POA: Diagnosis not present

## 2011-03-21 DIAGNOSIS — W108XXA Fall (on) (from) other stairs and steps, initial encounter: Secondary | ICD-10-CM | POA: Diagnosis present

## 2011-03-21 DIAGNOSIS — Z7901 Long term (current) use of anticoagulants: Secondary | ICD-10-CM | POA: Diagnosis not present

## 2011-03-21 DIAGNOSIS — Z79899 Other long term (current) drug therapy: Secondary | ICD-10-CM

## 2011-03-21 DIAGNOSIS — Z885 Allergy status to narcotic agent status: Secondary | ICD-10-CM | POA: Diagnosis not present

## 2011-03-21 DIAGNOSIS — I2510 Atherosclerotic heart disease of native coronary artery without angina pectoris: Secondary | ICD-10-CM | POA: Diagnosis not present

## 2011-03-21 DIAGNOSIS — R791 Abnormal coagulation profile: Secondary | ICD-10-CM | POA: Diagnosis not present

## 2011-03-21 DIAGNOSIS — Y92009 Unspecified place in unspecified non-institutional (private) residence as the place of occurrence of the external cause: Secondary | ICD-10-CM

## 2011-03-21 DIAGNOSIS — Z951 Presence of aortocoronary bypass graft: Secondary | ICD-10-CM

## 2011-03-21 DIAGNOSIS — I10 Essential (primary) hypertension: Secondary | ICD-10-CM | POA: Diagnosis present

## 2011-03-21 DIAGNOSIS — Z96659 Presence of unspecified artificial knee joint: Secondary | ICD-10-CM | POA: Diagnosis not present

## 2011-03-21 DIAGNOSIS — Z954 Presence of other heart-valve replacement: Secondary | ICD-10-CM | POA: Diagnosis not present

## 2011-03-21 DIAGNOSIS — S0990XA Unspecified injury of head, initial encounter: Secondary | ICD-10-CM | POA: Diagnosis not present

## 2011-03-21 DIAGNOSIS — Y998 Other external cause status: Secondary | ICD-10-CM

## 2011-03-21 HISTORY — PX: TOTAL KNEE ARTHROPLASTY: SHX125

## 2011-03-21 LAB — PROTIME-INR
INR: 1.04 (ref 0.00–1.49)
INR: 1.06 (ref 0.00–1.49)
Prothrombin Time: 14 seconds (ref 11.6–15.2)

## 2011-03-21 LAB — BASIC METABOLIC PANEL
CO2: 26 mEq/L (ref 19–32)
GFR calc non Af Amer: 89 mL/min — ABNORMAL LOW (ref >90)
Glucose, Bld: 87 mg/dL (ref 70–99)
Potassium: 3.8 mEq/L (ref 3.5–5.1)
Sodium: 136 mEq/L (ref 135–145)

## 2011-03-21 LAB — CBC
Platelets: 139 10*3/uL — ABNORMAL LOW (ref 150–400)
RBC: 4.04 MIL/uL (ref 3.87–5.11)
WBC: 3.7 10*3/uL — ABNORMAL LOW (ref 4.0–10.5)

## 2011-03-21 LAB — DIFFERENTIAL
Lymphocytes Relative: 20 % (ref 12–46)
Lymphs Abs: 0.8 10*3/uL (ref 0.7–4.0)
Neutrophils Relative %: 70 % (ref 43–77)

## 2011-03-21 LAB — TYPE AND SCREEN: ABO/RH(D): O POS

## 2011-03-21 SURGERY — ARTHROPLASTY, KNEE, TOTAL
Anesthesia: Spinal | Site: Knee | Laterality: Right | Wound class: Clean

## 2011-03-21 MED ORDER — DOCUSATE SODIUM 100 MG PO CAPS
100.0000 mg | ORAL_CAPSULE | Freq: Two times a day (BID) | ORAL | Status: DC
  Administered 2011-03-21 – 2011-03-24 (×6): 100 mg via ORAL
  Filled 2011-03-21 (×7): qty 1

## 2011-03-21 MED ORDER — SODIUM CHLORIDE 0.9 % IV SOLN
INTRAVENOUS | Status: DC
  Administered 2011-03-21 – 2011-03-23 (×4): via INTRAVENOUS
  Filled 2011-03-21 (×12): qty 1000

## 2011-03-21 MED ORDER — KETOROLAC TROMETHAMINE 30 MG/ML IJ SOLN
INTRAMUSCULAR | Status: DC | PRN
  Administered 2011-03-21: 30 mg

## 2011-03-21 MED ORDER — POLYETHYLENE GLYCOL 3350 17 G PO PACK
17.0000 g | PACK | Freq: Two times a day (BID) | ORAL | Status: DC
  Administered 2011-03-21 – 2011-03-24 (×6): 17 g via ORAL
  Filled 2011-03-21 (×7): qty 1

## 2011-03-21 MED ORDER — FENTANYL CITRATE 0.05 MG/ML IJ SOLN
INTRAMUSCULAR | Status: DC | PRN
  Administered 2011-03-21 (×2): 50 ug via INTRAVENOUS

## 2011-03-21 MED ORDER — METOCLOPRAMIDE HCL 10 MG PO TABS
5.0000 mg | ORAL_TABLET | Freq: Three times a day (TID) | ORAL | Status: DC | PRN
Start: 2011-03-21 — End: 2011-03-24

## 2011-03-21 MED ORDER — BUPIVACAINE IN DEXTROSE 0.75-8.25 % IT SOLN
INTRATHECAL | Status: DC | PRN
  Administered 2011-03-21: 12 mg via INTRATHECAL

## 2011-03-21 MED ORDER — ONDANSETRON HCL 4 MG/2ML IJ SOLN
INTRAMUSCULAR | Status: DC | PRN
  Administered 2011-03-21: 4 mg via INTRAVENOUS

## 2011-03-21 MED ORDER — ACETAMINOPHEN 10 MG/ML IV SOLN
INTRAVENOUS | Status: DC | PRN
  Administered 2011-03-21: 1000 mg via INTRAVENOUS

## 2011-03-21 MED ORDER — BISACODYL 5 MG PO TBEC: 5.0000 mg | DELAYED_RELEASE_TABLET | Freq: Every day | ORAL | Status: DC | PRN

## 2011-03-21 MED ORDER — WARFARIN SODIUM 5 MG PO TABS
5.0000 mg | ORAL_TABLET | Freq: Once | ORAL | Status: AC
  Administered 2011-03-21: 5 mg via ORAL
  Filled 2011-03-21: qty 1

## 2011-03-21 MED ORDER — FLEET ENEMA 7-19 GM/118ML RE ENEM: 1.0000 | ENEMA | Freq: Once | RECTAL | Status: AC | PRN

## 2011-03-21 MED ORDER — ENOXAPARIN SODIUM 40 MG/0.4ML ~~LOC~~ SOLN
40.0000 mg | SUBCUTANEOUS | Status: DC
  Administered 2011-03-22 – 2011-03-24 (×3): 40 mg via SUBCUTANEOUS
  Filled 2011-03-21 (×3): qty 0.4

## 2011-03-21 MED ORDER — ACETAMINOPHEN 10 MG/ML IV SOLN
INTRAVENOUS | Status: AC
  Filled 2011-03-21: qty 100

## 2011-03-21 MED ORDER — MIDAZOLAM HCL 5 MG/5ML IJ SOLN
INTRAMUSCULAR | Status: DC | PRN
  Administered 2011-03-21 (×2): 1 mg via INTRAVENOUS

## 2011-03-21 MED ORDER — METOCLOPRAMIDE HCL 5 MG/ML IJ SOLN: 5.0000 mg | Freq: Three times a day (TID) | INTRAMUSCULAR | Status: DC | PRN

## 2011-03-21 MED ORDER — HYDROMORPHONE HCL PF 1 MG/ML IJ SOLN: 0.2500 mg | INTRAMUSCULAR | Status: DC | PRN

## 2011-03-21 MED ORDER — PROMETHAZINE HCL 25 MG RE SUPP
25.0000 mg | Freq: Once | RECTAL | Status: DC | PRN
  Filled 2011-03-21: qty 1

## 2011-03-21 MED ORDER — ACETAMINOPHEN 325 MG PO TABS: 650.0000 mg | ORAL_TABLET | Freq: Four times a day (QID) | ORAL | Status: DC | PRN

## 2011-03-21 MED ORDER — PROPOFOL 10 MG/ML IV EMUL
INTRAVENOUS | Status: DC | PRN
  Administered 2011-03-21: 120 ug/kg/min via INTRAVENOUS

## 2011-03-21 MED ORDER — DIPHENHYDRAMINE HCL 25 MG PO CAPS: 25.0000 mg | ORAL_CAPSULE | Freq: Four times a day (QID) | ORAL | Status: DC | PRN

## 2011-03-21 MED ORDER — ZOLPIDEM TARTRATE 5 MG PO TABS: 5.0000 mg | ORAL_TABLET | Freq: Every evening | ORAL | Status: DC | PRN

## 2011-03-21 MED ORDER — FERROUS SULFATE 325 (65 FE) MG PO TABS
325.0000 mg | ORAL_TABLET | Freq: Three times a day (TID) | ORAL | Status: DC
Start: 2011-03-21 — End: 2011-03-24
  Administered 2011-03-21 – 2011-03-24 (×8): 325 mg via ORAL
  Filled 2011-03-21 (×10): qty 1

## 2011-03-21 MED ORDER — ONDANSETRON HCL 4 MG/2ML IJ SOLN: 4.0000 mg | Freq: Four times a day (QID) | INTRAMUSCULAR | Status: DC | PRN

## 2011-03-21 MED ORDER — HYDROCODONE-ACETAMINOPHEN 7.5-325 MG PO TABS
1.0000 | ORAL_TABLET | ORAL | Status: DC
  Administered 2011-03-21 (×2): 1 via ORAL
  Administered 2011-03-21 – 2011-03-22 (×2): 2 via ORAL
  Administered 2011-03-22: 1 via ORAL
  Administered 2011-03-22 (×2): 2 via ORAL
  Administered 2011-03-22: 1 via ORAL
  Administered 2011-03-22 – 2011-03-23 (×4): 2 via ORAL
  Administered 2011-03-23: 1 via ORAL
  Administered 2011-03-23: 2 via ORAL
  Administered 2011-03-23: 1 via ORAL
  Administered 2011-03-24: 2 via ORAL
  Filled 2011-03-21 (×6): qty 2
  Filled 2011-03-21: qty 1
  Filled 2011-03-21: qty 2
  Filled 2011-03-21: qty 1
  Filled 2011-03-21 (×5): qty 2
  Filled 2011-03-21: qty 1
  Filled 2011-03-21 (×2): qty 2

## 2011-03-21 MED ORDER — ALUM & MAG HYDROXIDE-SIMETH 200-200-20 MG/5ML PO SUSP: 30.0000 mL | ORAL | Status: DC | PRN

## 2011-03-21 MED ORDER — BUPIVACAINE-EPINEPHRINE PF 0.25-1:200000 % IJ SOLN
INTRAMUSCULAR | Status: AC
  Filled 2011-03-21: qty 60

## 2011-03-21 MED ORDER — LOSARTAN POTASSIUM 50 MG PO TABS
100.0000 mg | ORAL_TABLET | Freq: Every day | ORAL | Status: DC
  Administered 2011-03-21 – 2011-03-24 (×4): 100 mg via ORAL
  Filled 2011-03-21 (×4): qty 2

## 2011-03-21 MED ORDER — CEFAZOLIN SODIUM 1-5 GM-% IV SOLN
1.0000 g | INTRAVENOUS | Status: AC
  Administered 2011-03-21: 1 g via INTRAVENOUS

## 2011-03-21 MED ORDER — HYDROMORPHONE HCL PF 1 MG/ML IJ SOLN
0.5000 mg | INTRAMUSCULAR | Status: DC | PRN
  Administered 2011-03-21 (×2): 1 mg via INTRAVENOUS
  Filled 2011-03-21 (×2): qty 1

## 2011-03-21 MED ORDER — MENTHOL 3 MG MT LOZG
1.0000 | LOZENGE | OROMUCOSAL | Status: DC | PRN
  Filled 2011-03-21 (×2): qty 9

## 2011-03-21 MED ORDER — LACTATED RINGERS IV SOLN
INTRAVENOUS | Status: DC | PRN
  Administered 2011-03-21 (×2): via INTRAVENOUS

## 2011-03-21 MED ORDER — LEVOTHYROXINE SODIUM 100 MCG PO TABS
100.0000 ug | ORAL_TABLET | Freq: Every day | ORAL | Status: DC
  Administered 2011-03-22 – 2011-03-24 (×3): 100 ug via ORAL
  Filled 2011-03-21 (×3): qty 1

## 2011-03-21 MED ORDER — KETOROLAC TROMETHAMINE 30 MG/ML IJ SOLN
INTRAMUSCULAR | Status: AC
  Filled 2011-03-21: qty 1

## 2011-03-21 MED ORDER — TETANUS-DIPHTH-ACELL PERTUSSIS 5-2.5-18.5 LF-MCG/0.5 IM SUSP
0.5000 mL | Freq: Once | INTRAMUSCULAR | Status: AC
  Administered 2011-03-21: 0.5 mL via INTRAMUSCULAR
  Filled 2011-03-21: qty 0.5

## 2011-03-21 MED ORDER — METHOCARBAMOL 500 MG PO TABS
500.0000 mg | ORAL_TABLET | Freq: Four times a day (QID) | ORAL | Status: DC | PRN
  Administered 2011-03-21 – 2011-03-23 (×6): 500 mg via ORAL
  Filled 2011-03-21 (×6): qty 1

## 2011-03-21 MED ORDER — METHOCARBAMOL 100 MG/ML IJ SOLN
500.0000 mg | Freq: Four times a day (QID) | INTRAVENOUS | Status: DC | PRN
  Filled 2011-03-21: qty 5

## 2011-03-21 MED ORDER — BUPROPION HCL ER (SR) 150 MG PO TB12
150.0000 mg | ORAL_TABLET | Freq: Every day | ORAL | Status: DC
  Administered 2011-03-22 – 2011-03-24 (×3): 150 mg via ORAL
  Filled 2011-03-21 (×4): qty 1

## 2011-03-21 MED ORDER — CEFAZOLIN SODIUM 1-5 GM-% IV SOLN
INTRAVENOUS | Status: AC
  Filled 2011-03-21: qty 50

## 2011-03-21 MED ORDER — ONDANSETRON HCL 4 MG PO TABS: 4.0000 mg | ORAL_TABLET | Freq: Four times a day (QID) | ORAL | Status: DC | PRN

## 2011-03-21 MED ORDER — CEFAZOLIN SODIUM 1-5 GM-% IV SOLN
1.0000 g | Freq: Four times a day (QID) | INTRAVENOUS | Status: AC
  Administered 2011-03-21 – 2011-03-22 (×3): 1 g via INTRAVENOUS
  Filled 2011-03-21 (×3): qty 50

## 2011-03-21 MED ORDER — LACTATED RINGERS IV SOLN: INTRAVENOUS | Status: DC

## 2011-03-21 MED ORDER — 0.9 % SODIUM CHLORIDE (POUR BTL) OPTIME
TOPICAL | Status: DC | PRN
  Administered 2011-03-21: 1000 mL

## 2011-03-21 MED ORDER — VERAPAMIL HCL ER 240 MG PO TBCR
240.0000 mg | EXTENDED_RELEASE_TABLET | Freq: Every day | ORAL | Status: DC
  Administered 2011-03-22 – 2011-03-24 (×3): 240 mg via ORAL
  Filled 2011-03-21 (×3): qty 1

## 2011-03-21 MED ORDER — WARFARIN SODIUM 5 MG PO TABS: 5.0000 mg | ORAL_TABLET | ORAL | Status: DC

## 2011-03-21 MED ORDER — PHENOL 1.4 % MT LIQD
1.0000 | OROMUCOSAL | Status: DC | PRN
  Filled 2011-03-21: qty 177

## 2011-03-21 MED ORDER — ACETAMINOPHEN 650 MG RE SUPP: 650.0000 mg | Freq: Four times a day (QID) | RECTAL | Status: DC | PRN

## 2011-03-21 MED ORDER — BUPIVACAINE-EPINEPHRINE PF 0.25-1:200000 % IJ SOLN
INTRAMUSCULAR | Status: DC | PRN
  Administered 2011-03-21: 60 mL

## 2011-03-21 SURGICAL SUPPLY — 60 items
BAG SPEC THK2 15X12 ZIP CLS (MISCELLANEOUS) ×1
BAG ZIPLOCK 12X15 (MISCELLANEOUS) ×2 IMPLANT
BANDAGE ELASTIC 6 VELCRO ST LF (GAUZE/BANDAGES/DRESSINGS) ×2 IMPLANT
BANDAGE ESMARK 6X9 LF (GAUZE/BANDAGES/DRESSINGS) ×1 IMPLANT
BLADE SAW SGTL 13.0X1.19X90.0M (BLADE) ×2 IMPLANT
BNDG ESMARK 6X9 LF (GAUZE/BANDAGES/DRESSINGS) ×2
BONE CEMENT GENTAMICIN (Cement) ×4 IMPLANT
BOWL SMART MIX CTS (DISPOSABLE) ×2 IMPLANT
CEMENT BONE GENTAMICIN 40 (Cement) ×2 IMPLANT
CLOTH BEACON ORANGE TIMEOUT ST (SAFETY) ×2 IMPLANT
CUFF TOURN SGL QUICK 34 (TOURNIQUET CUFF) ×1
CUFF TRNQT CYL 34X4X40X1 (TOURNIQUET CUFF) ×1 IMPLANT
DECANTER SPIKE VIAL GLASS SM (MISCELLANEOUS) IMPLANT
DERMABOND ADVANCED (GAUZE/BANDAGES/DRESSINGS) ×1
DERMABOND ADVANCED .7 DNX12 (GAUZE/BANDAGES/DRESSINGS) ×1 IMPLANT
DRAPE EXTREMITY T 121X128X90 (DRAPE) ×2 IMPLANT
DRAPE POUCH INSTRU U-SHP 10X18 (DRAPES) ×2 IMPLANT
DRAPE U-SHAPE 47X51 STRL (DRAPES) ×2 IMPLANT
DRSG AQUACEL AG ADV 3.5X10 (GAUZE/BANDAGES/DRESSINGS) ×2 IMPLANT
DRSG TEGADERM 4X4.75 (GAUZE/BANDAGES/DRESSINGS) ×2 IMPLANT
DURAPREP 26ML APPLICATOR (WOUND CARE) ×2 IMPLANT
ELECT REM PT RETURN 9FT ADLT (ELECTROSURGICAL) ×2
ELECTRODE REM PT RTRN 9FT ADLT (ELECTROSURGICAL) ×1 IMPLANT
EVACUATOR 1/8 PVC DRAIN (DRAIN) ×2 IMPLANT
FACESHIELD LNG OPTICON STERILE (SAFETY) ×10 IMPLANT
GAUZE SPONGE 2X2 8PLY STRL LF (GAUZE/BANDAGES/DRESSINGS) ×1 IMPLANT
GLOVE BIOGEL PI IND STRL 6.5 (GLOVE) ×2 IMPLANT
GLOVE BIOGEL PI IND STRL 7.5 (GLOVE) ×1 IMPLANT
GLOVE BIOGEL PI IND STRL 8 (GLOVE) ×1 IMPLANT
GLOVE BIOGEL PI INDICATOR 6.5 (GLOVE) ×2
GLOVE BIOGEL PI INDICATOR 7.5 (GLOVE) ×1
GLOVE BIOGEL PI INDICATOR 8 (GLOVE) ×1
GLOVE ECLIPSE 8.0 STRL XLNG CF (GLOVE) ×2 IMPLANT
GLOVE ORTHO TXT STRL SZ7.5 (GLOVE) ×4 IMPLANT
GLOVE SURG SS PI 6.5 STRL IVOR (GLOVE) ×4 IMPLANT
GOWN BRE IMP PREV XXLGXLNG (GOWN DISPOSABLE) ×2 IMPLANT
GOWN STRL NON-REIN LRG LVL3 (GOWN DISPOSABLE) ×6 IMPLANT
HANDPIECE INTERPULSE COAX TIP (DISPOSABLE) ×1
IMMOBILIZER KNEE 20 (SOFTGOODS) ×2
IMMOBILIZER KNEE 20 THIGH 36 (SOFTGOODS) ×1 IMPLANT
KIT BASIN OR (CUSTOM PROCEDURE TRAY) ×2 IMPLANT
MANIFOLD NEPTUNE II (INSTRUMENTS) ×2 IMPLANT
NDL SAFETY ECLIPSE 18X1.5 (NEEDLE) ×1 IMPLANT
NEEDLE HYPO 18GX1.5 SHARP (NEEDLE) ×1
NS IRRIG 1000ML POUR BTL (IV SOLUTION) ×2 IMPLANT
PACK TOTAL JOINT (CUSTOM PROCEDURE TRAY) ×2 IMPLANT
POSITIONER SURGICAL ARM (MISCELLANEOUS) ×2 IMPLANT
SET HNDPC FAN SPRY TIP SCT (DISPOSABLE) ×1 IMPLANT
SET PAD KNEE POSITIONER (MISCELLANEOUS) ×2 IMPLANT
SPONGE GAUZE 2X2 STER 10/PKG (GAUZE/BANDAGES/DRESSINGS) ×1
SUCTION FRAZIER 12FR DISP (SUCTIONS) ×2 IMPLANT
SUT MNCRL AB 4-0 PS2 18 (SUTURE) ×2 IMPLANT
SUT VIC AB 1 CT1 36 (SUTURE) ×6 IMPLANT
SUT VIC AB 2-0 CT1 27 (SUTURE) ×3
SUT VIC AB 2-0 CT1 TAPERPNT 27 (SUTURE) ×3 IMPLANT
SYR 50ML LL SCALE MARK (SYRINGE) ×2 IMPLANT
TOWEL OR 17X26 10 PK STRL BLUE (TOWEL DISPOSABLE) ×4 IMPLANT
TRAY FOLEY CATH 14FRSI W/METER (CATHETERS) ×2 IMPLANT
WATER STERILE IRR 1500ML POUR (IV SOLUTION) ×2 IMPLANT
WRAP KNEE MAXI GEL POST OP (GAUZE/BANDAGES/DRESSINGS) ×2 IMPLANT

## 2011-03-21 NOTE — Progress Notes (Signed)
To ER via stretcher to be evaluated by ER physician

## 2011-03-21 NOTE — Preoperative (Signed)
Beta Blockers   Reason not to administer Beta Blockers:not applicable 

## 2011-03-21 NOTE — ED Notes (Signed)
MD at bedside.   Dr. Charlann Boxer.

## 2011-03-21 NOTE — Progress Notes (Signed)
Pt arrived from PACU in bed, A&Ox4. Assessment as charted. Pt oriented to callbell and environment, POC discussed. Using IS effectively to 1500. Sipping ginger ale, ordering dinner tray. No c/o at present. Pt had spinal anesthesia, instructed to call with first twinges of pain. Currently pt w/ decreased sensation to BLE and no movement to BLE.

## 2011-03-21 NOTE — ED Notes (Signed)
Per Dr. Loralyn Freshwater back to short stay.

## 2011-03-21 NOTE — Transfer of Care (Signed)
Immediate Anesthesia Transfer of Care Note  Patient: Paula Ferguson  Procedure(s) Performed:  TOTAL KNEE ARTHROPLASTY  Patient Location: PACU  Anesthesia Type: Spinal  Level of Consciousness: sedated, patient cooperative and responds to stimulaton  Airway & Oxygen Therapy: Patient Spontanous Breathing and Patient connected to face mask oxgen  Post-op Assessment: Report given to PACU RN and Post -op Vital signs reviewed and stable  Post vital signs: Reviewed and stable  Complications: No apparent anesthesia complications

## 2011-03-21 NOTE — ED Provider Notes (Signed)
History     CSN: [id number]  Arrival date & time 03/21/11  4098   First MD Initiated Contact with Patient 03/21/11 [phone]      Chief Complaint  Patient presents with  . Fall    (Consider location/radiation/quality/duration/timing/severity/associated sxs/prior treatment) Patient is a 71 y.o. female presenting with fall. The history is provided by the patient.  Fall Associated symptoms include headaches.   the patient is a 71 year old, female, with a history of a mechanical heart valve, for which she was taking Coumadin.  However, it was stopped, and she was placed on Lovenox.  Because she is scheduled to have a knee replacement.  Today.  She was walking out of her house down stairs and fell backward and hit the back of her head.  She did not have loss of consciousness.  She complains of a mild headache and bleeding.  She denies vision changes, nausea, vomiting, neck pain, weakness, or pain anywhere else.  She has not been sick with fevers, cough, difficulty breathing, or urinary tract symptoms recently.  Past Medical History  Diagnosis Date  . ANEMIA 09/15/2009  . HYPERLIPIDEMIA 02/07/2007  . HYPERTENSION 09/14/2006  . HYPOTHYROIDISM 09/14/2006  . LIVER FUNCTION TESTS, ABNORMAL, HX OF 01/12/2009  . OLECRANON BURSITIS, LEFT 08/26/2007  . OSTEOARTHRITIS 02/07/2007  . Blood transfusion   . CORONARY ARTERY DISEASE 08/26/2007    LOV Dr Katrinka Blazing 1/13 with EKG and clearance on chart    Past Surgical History  Procedure Date  . Cholecystectomy   . Abdominal hysterectomy   . Knee surgery   . Ankle surgery   . Wrist surgery   . Shoulder surgery   . Coronary artery bypass graft     with  aortic valve replacement 2003  . Cardiac valve replacement   . Cardiac catheterization     2003  . Joint replacement     right shoulder arthroscopy, left partial knee replacement  . Tonsillectomy   . Colonoscopy     No family history on file.  History  Substance Use Topics  . Smoking status: Never  Smoker   . Smokeless tobacco: Never Used  . Alcohol Use: No    OB History    Grav Para Term Preterm Abortions TAB SAB Ect Mult Living                  Review of Systems  HENT: Negative for neck pain.   Musculoskeletal: Negative for back pain.  Skin: Positive for wound.       1 cm occipital laceration, which was closed by the PA for the orthopedist.  Neurological: Positive for headaches. Negative for weakness.  Hematological: Does not bruise/bleed easily.  Psychiatric/Behavioral: Negative for confusion.  All other systems reviewed and are negative.    Allergies  Hydromorphone hcl; Meperidine hcl; and Morphine sulfate  Home Medications  No current outpatient prescriptions on file.  There were no vitals taken for this visit.  Physical Exam  Vitals reviewed. Constitutional: She is oriented to person, place, and time. She appears well-developed and well-nourished.  HENT:  Head: Normocephalic.       1/4 cm occipital laceration, does not need staple  Eyes: Pupils are equal, round, and reactive to light.  Neck: Normal range of motion.       No cervical tenderness to palpation  Cardiovascular: Normal rate, regular rhythm and normal heart sounds.   No murmur heard. Pulmonary/Chest: Effort normal and breath sounds normal. No respiratory distress. She has no wheezes. She  has no rales.  Abdominal: She exhibits no distension.  Musculoskeletal: Normal range of motion. She exhibits no edema and no tenderness.       No thoracic or lumbar spinal tenderness to palpation  Neurological: She is alert and oriented to person, place, and time. No cranial nerve deficit.  Skin: Skin is warm and dry. No rash noted. No erythema.  Psychiatric: She has a normal mood and affect. Her behavior is normal.    ED Course  Procedures (including critical care time) 71 year old, female, presents to emergency department with mild occipital headache.  After a fall today.  Her neurological examination is  completely normal and her physical examination is normal except for a 1/4 cm occipital laceration.  However, she is on Lovenox.  because of her age and the anticoagulant.  We will do a CAT scan of her head to look for any evidence of bleeding.   Labs Reviewed  CBC  DIFFERENTIAL  BASIC METABOLIC PANEL  PROTIME-INR   No results found.   No diagnosis found.  10:46 AM Discussed with Dr. Charlann Boxer.  He will do surgery today.     MDM  Occipital laceration - no tx indicated        Nicholes Stairs, MD 03/21/11 1048

## 2011-03-21 NOTE — ED Notes (Addendum)
MD at bedside.  Dr. Weldon Inches

## 2011-03-21 NOTE — ED Notes (Signed)
Patient transported to CT 

## 2011-03-21 NOTE — Interval H&P Note (Signed)
History and Physical Interval Note:  03/21/2011 1:27 PM  Paula Ferguson  has presented today for surgery, with the diagnosis of osteoarthritis right knee   The various methods of treatment have been discussed with the patient and family. After consideration of risks, benefits and other options for treatment, the patient has consented to  Procedure(s): RIGHT TOTAL KNEE ARTHROPLASTY as a surgical intervention .  The patients' history has been reviewed, patient examined, no change in status, stable for surgery.  I have reviewed the patients' chart and labs.  Questions were answered to the patient's satisfaction.     Shelda Pal

## 2011-03-21 NOTE — Anesthesia Postprocedure Evaluation (Signed)
Anesthesia Post Note  Patient: Paula Ferguson  Procedure(s) Performed:  TOTAL KNEE ARTHROPLASTY  Anesthesia type: Spinal  Patient location: PACU  Post pain: Pain level controlled  Post assessment: Post-op Vital signs reviewed  Last Vitals:  Filed Vitals:   03/21/11 1046  BP: 145/68  Pulse: 85  Temp: 36.9 C  Resp: 18    Post vital signs: Reviewed  Level of consciousness: sedated  Complications: No apparent anesthesia complications

## 2011-03-21 NOTE — ED Notes (Signed)
ZOX:[id number]<AV> Expected date:<BR> Expected time:<BR> Means of arrival:<BR> Comments:<BR> Pt is in room 8---she is showing up in the triage area.  Epic is working on fixing it.

## 2011-03-21 NOTE — Progress Notes (Signed)
Returned to short stay from ER to wait for surgery

## 2011-03-21 NOTE — Op Note (Signed)
NAME:  Paula STOFER                      MEDICAL RECORD NO.:  [id number]                             FACILITY:  [facility]      PHYSICIAN:  Madlyn Frankel. Charlann Boxer, M.D.  DATE OF BIRTH:  1941/02/19      DATE OF PROCEDURE:  03/21/2011                                     OPERATIVE REPORT         PREOPERATIVE DIAGNOSIS:  Right knee osteoarthritis.      POSTOPERATIVE DIAGNOSIS:  Right knee osteoarthritis.      FINDINGS:  The patient was noted to have complete loss of cartilage and   bone-on-bone arthritis with associated osteophytes in the lateral and patellofemoral compartments of   the knee with a significant synovitis and associated effusion.      PROCEDURE:  Right total knee replacement.      COMPONENTS USED:  DePuy rotating platform posterior stabilized knee   system, a size 3 femur, 3 tibia, 12.5 mm insert, and 38 patellar   button.      SURGEON:  Madlyn Frankel. Charlann Boxer, M.D.      ASSISTANT:  Lanney Gins, PA-C.      ANESTHESIA:  Spinal.      SPECIMENS:  None.      COMPLICATION:  None.      DRAINS:  One Hemovac.  EBL: 50cc      TOURNIQUET TIME:   Total Tourniquet Time Documented: Thigh (Right) - 38 minutes .      The patient was stable to the recovery room.      INDICATION FOR PROCEDURE:  Paula Ferguson is a 71 y.o. female patient of   mine.  The patient had been seen, evaluated, and treated conservatively in the   office with medication, activity modification, and injections.  The patient had   radiographic changes of bone-on-bone arthritis with endplate sclerosis and osteophytes noted.      The patient failed conservative measures including medication, injections, and activity modification, and at this point was ready for more definitive measures.   Based on the radiographic changes and failed conservative measures, the patient   decided to proceed with total knee replacement.  Risks of infection,   DVT, component failure, need for revision surgery, postop course, and     expectations were all   discussed and reviewed.  Consent was obtained for benefit of pain   relief.      PROCEDURE IN DETAIL:  The patient was brought to the operative theater.   Once adequate anesthesia, preoperative antibiotics, 1 gm of Ancef administered, the patient was positioned supine with the right thigh tourniquet placed.  The  right lower extremity was prepped and draped in sterile fashion.  A time-   out was performed identifying the patient, planned procedure, and   extremity.      The right lower extremity was placed in the [facility] leg holder.  The leg was   exsanguinated, tourniquet elevated to 250 mmHg.  A midline incision was   made followed by median parapatellar arthrotomy.  Following initial   exposure, attention was first directed to the patella.  Precut  measurement was noted to be 23 mm.  I resected down to 14 mm and used a   38 patellar button to restore patellar height as well as cover the cut   surface.      The lug holes were drilled and a metal shim was placed to protect the   patella from retractors and saw blades.      At this point, attention was now directed to the femur.  The femoral   canal was opened with a drill, irrigated to try to prevent fat emboli.  An   intramedullary rod was passed at 5 degrees valgus, 10 mm of bone was   resected off the distal femur.  Following this resection, the tibia was   subluxated anteriorly.  Using the extramedullary guide, 2 mm of bone was resected off   the proximal lateral tibia.  We confirmed the gap would be   stable medially and laterally with a 10 mm insert as well as confirmed   the cut was perpendicular in the coronal plane, checking with an alignment rod.      Once this was done, I sized the femur to be a size 3 in the anterior-   posterior dimension, chose a standard component based on medial and   lateral dimension.  The size 3 rotation block was then pinned in   position anterior referenced using the  C-clamp to set rotation.  The   anterior, posterior, and  chamfer cuts were made without difficulty nor   notching making certain that I was along the anterior cortex to help   with flexion gap stability.      The final box cut was made off the lateral aspect of distal femur.      At this point, the tibia was sized to be a size 3, the size 3 tray was   then pinned in position through the medial third of the tubercle,   drilled, and keel punched.  Trial reduction was now carried with a 3 femur,  3 tibia, a 12.5 mm insert, and the 38 patella botton.  The knee was brought to   extension, full extension with good flexion stability with the patella   tracking through the trochlea without application of pressure.  Given   all these findings, the trial components removed.  Final components were   opened and cement was mixed.  The knee was irrigated with normal saline   solution and pulse lavage.  The synovial lining was   then injected with 0.25% Marcaine with epinephrine and 1 cc of Toradol,   total of 61 cc.      The knee was irrigated.  Final implants were then cemented onto clean and   dried cut surfaces of bone with the knee brought to extension with a 12.5   mm trial insert.      Once the cement had fully cured, the excess cement was removed   throughout the knee.  I confirmed I was satisfied with the range of   motion and stability, and the final 12.5 mm insert was chosen.  It was   placed into the knee.      The tourniquet had been let down at 36 minutes.  No significant   hemostasis required.  The medium Hemovac drain was placed deep.  The   extensor mechanism was then reapproximated using #1 Vicryl with the knee   in flexion.  The   remaining wound was closed with 2-0 Vicryl  and running 4-0 Monocryl.   The knee was cleaned, dried, dressed sterilely using Dermabond and   Aquacel dressing.  Drain site dressed separately.  The patient was then   brought to recovery room in stable  condition, tolerating the procedure   well.   Please note that Physician Assistant, Danae Orleans, was present for the entirety of the case, and was utilized for pre-operative positioning, peri-operative retractor management, general facilitation of the procedure.  He was also utilized for primary wound closure at the end of the case.              Pietro Cassis Alvan Dame, M.D.

## 2011-03-21 NOTE — Anesthesia Procedure Notes (Signed)
Spinal  Patient location during procedure: OR Staffing Performed by: anesthesiologist  Preanesthetic Checklist Completed: patient identified, site marked, surgical consent, pre-op evaluation, timeout performed, IV checked, risks and benefits discussed and monitors and equipment checked Spinal Block Patient position: sitting Prep: Betadine Patient monitoring: heart rate, continuous pulse ox and blood pressure Injection technique: single-shot Needle Needle type: Spinocan  Needle gauge: 22 G Needle length: 9 cm Assessment Sensory level: T6 Additional Notes Expiration date of kit checked and confirmed. Patient tolerated procedure well, without complications.

## 2011-03-21 NOTE — ED Notes (Signed)
Pt is scheduled for total knee replacement on right knee today (Dr. Charlann Boxer.)  When she was leaving her house, she slipped on a wet stone and fell back and hit the back of her head.  When she got to the short stay unit this morning and told them what happened, they wanted her evaluated in the ED before going forward with surgery.  She is c/o some left shoulder pain also, which is chronic, but is more painful after the fall than it had been.  There is some dried blood on the back of her head.  No active bleeding at this time.

## 2011-03-21 NOTE — Progress Notes (Signed)
Pt fell at home before coming to hospital. She fell backwards hitting the back of her head and her left shoulder. She has a small gapping wound on the back of her head which is bleeding. The back of her left shoulder is slightly red. She has a history of left shoulder pain and her shoulder pain level is presently a 9.  We presently have her lying on a stretcher with ice to her head.  Dr. Charlann Boxer has been notified and will come to evaluate her. She is alert and complains  with only slight burning in her . Dr Charlann Boxer here now to see pt.

## 2011-03-21 NOTE — Progress Notes (Signed)
ANTICOAGULATION CONSULT NOTE - Initial Consult  Pharmacy Consult for Coumadin Indication: Mechanical Valve  Allergies  Allergen Reactions  . Hydromorphone Hcl Itching  . Meperidine Hcl Itching  . Morphine Sulfate Itching    Patient Measurements: Height: 5\' 4"  (162.6 cm) Weight: 122 lb 12.4 oz (55.69 kg) IBW/kg (Calculated) : 54.7     Vital Signs: Temp: 97.4 F (36.3 C) (01/29 1654) Temp src: Oral (01/29 1654) BP: 132/78 mmHg (01/29 1654) Pulse Rate: 83  (01/29 1654)  Labs:  Basename 03/21/11 1130 03/21/11 0950  HGB -- 12.3  HCT -- 36.9  PLT -- 139*  APTT -- --  LABPROT 14.0 13.8  INR 1.06 1.04  HEPARINUNFRC -- --  CREATININE -- 0.65  CKTOTAL -- --  CKMB -- --  TROPONINI -- --   Estimated Creatinine Clearance: 57.3 ml/min (by C-G formula based on Cr of 0.65).  Medical History: Past Medical History  Diagnosis Date  . ANEMIA 09/15/2009  . HYPERLIPIDEMIA 02/07/2007  . HYPERTENSION 09/14/2006  . HYPOTHYROIDISM 09/14/2006  . LIVER FUNCTION TESTS, ABNORMAL, HX OF 01/12/2009  . OLECRANON BURSITIS, LEFT 08/26/2007  . OSTEOARTHRITIS 02/07/2007  . Blood transfusion   . CORONARY ARTERY DISEASE 08/26/2007    LOV Dr Katrinka Blazing 1/13 with EKG and clearance on chart    Medications:  Scheduled:    . buPROPion  150 mg Oral Daily  .  ceFAZolin (ANCEF) IV  1 g Intravenous 60 min Pre-Op  .  ceFAZolin (ANCEF) IV  1 g Intravenous Q6H  . docusate sodium  100 mg Oral BID  . enoxaparin  40 mg Subcutaneous Q24H  . ferrous sulfate  325 mg Oral TID PC  . HYDROcodone-acetaminophen  1-2 tablet Oral Q4H  . levothyroxine  100 mcg Oral QAC breakfast  . losartan  100 mg Oral Daily  . polyethylene glycol  17 g Oral BID  . TDaP  0.5 mL Intramuscular Once  . verapamil  240 mg Oral Daily  . DISCONTD: warfarin  5-7.5 mg Oral See admin instructions   Infusions:    . sodium chloride 0.9 % 1,000 mL with potassium chloride 10 mEq infusion    . DISCONTD: lactated ringers      Assessment: 71  year old female with h/o CABG with mechanical aortic valve replacement.  Patient s/p right TKR today.  Prior to admission patient was on Coumadin 5mg  daily except 7.5mg  on Thursdays.  Last dose of Coumadin was 03/15/11 in anticipation of surgery and bridge therapy with Lovenox 60mg  sq q12h was initiated.  Last dose of Lovenox 60mg  was 03/19/10 around 8:30a.m.  Goal of Therapy:  INR 2.5-3.5 for mechanical valve   Plan:  1.  Coumadin 5mg  po x 1 today 2.  Per Dr Charlann Boxer, will bridge anticoagulation with Lovenox 40mg  daily post-op 3.  Check daily PT/INR  Riham Polyakov Trefz 03/21/2011,5:28 PM

## 2011-03-21 NOTE — Progress Notes (Signed)
No further bleeding from head wound

## 2011-03-21 NOTE — Anesthesia Preprocedure Evaluation (Signed)
Anesthesia Evaluation  Patient identified by MRN, date of birth, ID band Patient awake    Reviewed: Allergy & Precautions, H&P , NPO status , Patient's Chart, lab work & pertinent test results  Airway Mallampati: II TM Distance: <3 FB Neck ROM: Full    Dental No notable dental hx.    Pulmonary neg pulmonary ROS,  clear to auscultation  Pulmonary exam normal       Cardiovascular hypertension, + CAD + Valvular Problems/Murmurs Regular Normal    Neuro/Psych Negative Neurological ROS  Negative Psych ROS   GI/Hepatic negative GI ROS, Neg liver ROS,   Endo/Other  Hypothyroidism   Renal/GU negative Renal ROS  Genitourinary negative   Musculoskeletal negative musculoskeletal ROS (+)   Abdominal   Peds negative pediatric ROS (+)  Hematology negative hematology ROS (+)   Anesthesia Other Findings   Reproductive/Obstetrics negative OB ROS                           Anesthesia Physical Anesthesia Plan  ASA: III  Anesthesia Plan: Spinal   Post-op Pain Management:    Induction:   Airway Management Planned: Simple Face Mask  Additional Equipment:   Intra-op Plan:   Post-operative Plan:   Informed Consent: I have reviewed the patients History and Physical, chart, labs and discussed the procedure including the risks, benefits and alternatives for the proposed anesthesia with the patient or authorized representative who has indicated his/her understanding and acceptance.   Dental advisory given  Plan Discussed with: CRNA  Anesthesia Plan Comments:         Anesthesia Quick Evaluation

## 2011-03-21 NOTE — H&P (View-Only) (Signed)
Paula Ferguson is an 71 y.o. female.    Chief Complaint: right knee OA and pain   HPI: Pt is a 71 y.o. female complaining of right knee pain for many years. Pain had continually increased since the beginning. Pt had pain in both knees and had a UKR of the left knee in 2006, at that time she was told that she would have to have some kind of replacement of the right knee.  She was putting it off as much as possible, but feels that it is really effecting her quality of life. X-rays in the clinic show end-stage arthritic changes of the right knee. Pt has tried various conservative treatments which have failed to alleviate their symptoms, including occasional NSAIDs and steroid injections. She had a right knee arthroscopy in 2012, at which time she states it showed bone-on-bone changes. Various options are discussed with the patient. Risks, benefits and expectations were discussed with the patient. Patient understand the risks, benefits and expectations and wishes to proceed with surgery.   PCP:  Rogelia Boga, MD, MD  D/C Plans:  Home with HHPT  Post-op Meds:  Rx given for ASA, Robaxin, Iron, Colace and MiraLax. Post-op she will need to go back on her Coumadin with branching Lovenox  Tranexamic Acid:  Not to be given  PMH: Past Medical History  Diagnosis Date  . ANEMIA 09/15/2009  . HYPERLIPIDEMIA 02/07/2007  . HYPERTENSION 09/14/2006  . HYPOTHYROIDISM 09/14/2006  . LIVER FUNCTION TESTS, ABNORMAL, HX OF 01/12/2009  . OLECRANON BURSITIS, LEFT 08/26/2007  . OSTEOARTHRITIS 02/07/2007  . Blood transfusion   . CORONARY ARTERY DISEASE 08/26/2007    LOV Dr Katrinka Blazing 1/13 with EKG and clearance on chart    PSH: Past Surgical History  Procedure Date  . Cholecystectomy   . Abdominal hysterectomy   . Knee surgery   . Ankle surgery   . Wrist surgery   . Shoulder surgery   . Coronary artery bypass graft     with  aortic valve replacement 2003  . Cardiac valve replacement   . Cardiac  catheterization     2003  . Joint replacement     right shoulder arthroscopy, left partial knee replacement  . Tonsillectomy   . Colonoscopy     Social History:  reports that she has never smoked. She has never used smokeless tobacco. She reports that she does not drink alcohol or use illicit drugs.  Allergies:  Allergies  Allergen Reactions  . Hydromorphone Hcl Itching  . Meperidine Hcl Itching  . Morphine Sulfate Itching    Medications: No current facility-administered medications for this encounter.   Current Outpatient Prescriptions  Medication Sig Dispense Refill  . buPROPion (WELLBUTRIN SR) 150 MG 12 hr tablet Take 150 mg by mouth daily.       . Calcium Citrate-Vitamin D (CITRACAL PETITES/VITAMIN D PO) Take 2 tablets by mouth daily.      . cholecalciferol (VITAMIN D) 1000 UNITS tablet Take 1,000 Units by mouth daily.      . ferrous sulfate 325 (65 FE) MG tablet Take 325 mg by mouth daily with breakfast.       . HYDROcodone-acetaminophen (VICODIN) 5-500 MG per tablet Take 1 tablet by mouth every 6 (six) hours as needed. PAIN       . levothyroxine (SYNTHROID, LEVOTHROID) 100 MCG tablet Take 100 mcg by mouth daily.       Marland Kitchen losartan (COZAAR) 100 MG tablet Take 100 mg by mouth daily.       Marland Kitchen  Multiple Vitamins-Minerals (CENTRUM PO) Take 1 tablet by mouth daily.      Marland Kitchen oxyCODONE-acetaminophen (PERCOCET) 5-325 MG per tablet Take 0.5 tablets by mouth every 4 (four) hours as needed. Pain       . traZODone (DESYREL) 100 MG tablet Take 100 mg by mouth at bedtime.       . verapamil (CALAN-SR) 240 MG CR tablet Take 240 mg by mouth.       . verapamil (CALAN-SR) 240 MG CR tablet Take 240 mg by mouth every morning.      . warfarin (COUMADIN) 5 MG tablet Take 5-7.5 mg by mouth daily. 7.5 MG ON Thursday AND 5 MG ALL OTHER DAYS       Facility-Administered Medications Ordered in Other Encounters  Medication Dose Route Frequency Provider Last Rate Last Dose  . DISCONTD: chlorhexidine  (HIBICLENS) 4 % liquid 4 application  60 mL Topical Once Genelle Gather [place], [place]        Results for orders placed during the hospital encounter of 03/14/11 (from the past 48 hour(s))  APTT     Status: Abnormal   Collection Time   03/14/11  2:10 PM      Component Value Range Comment   aPTT 60 (*) 24 - 37 (seconds)   BASIC METABOLIC PANEL     Status: Abnormal   Collection Time   03/14/11  2:10 PM      Component Value Range Comment   Sodium 135  135 - 145 (mEq/L)    Potassium 4.0  3.5 - 5.1 (mEq/L)    Chloride 101  96 - 112 (mEq/L)    CO2 27  19 - 32 (mEq/L)    Glucose, Bld 84  70 - 99 (mg/dL)    BUN 12  6 - 23 (mg/dL)    Creatinine, Ser 1.61  0.50 - 1.10 (mg/dL)    Calcium 9.9  8.4 - 10.5 (mg/dL)    GFR calc non Af Amer 87 (*) >90 (mL/min)    GFR calc Af Amer >90  >90 (mL/min)   CBC     Status: Normal   Collection Time   03/14/11  2:10 PM      Component Value Range Comment   WBC 5.9  4.0 - 10.5 (K/uL)    RBC 4.20  3.87 - 5.11 (MIL/uL)    Hemoglobin 12.7  12.0 - 15.0 (g/dL)    HCT 09.6  04.5 - 40.9 (%)    MCV 91.0  78.0 - 100.0 (fL)    MCH 30.2  26.0 - 34.0 (pg)    MCHC 33.2  30.0 - 36.0 (g/dL)    RDW 81.1  91.4 - 78.2 (%)    Platelets 185  150 - 400 (K/uL)   DIFFERENTIAL     Status: Normal   Collection Time   03/14/11  2:10 PM      Component Value Range Comment   Neutrophils Relative 68  43 - 77 (%)    Neutro Abs 4.0  1.7 - 7.7 (K/uL)    Lymphocytes Relative 21  12 - 46 (%)    Lymphs Abs 1.3  0.7 - 4.0 (K/uL)    Monocytes Relative 9  3 - 12 (%)    Monocytes Absolute 0.5  0.1 - 1.0 (K/uL)    Eosinophils Relative 2  0 - 5 (%)    Eosinophils Absolute 0.1  0.0 - 0.7 (K/uL)    Basophils Relative 0  0 - 1 (%)    Basophils Absolute  0.0  0.0 - 0.1 (K/uL)   PROTIME-INR     Status: Abnormal   Collection Time   03/14/11  2:10 PM      Component Value Range Comment   Prothrombin Time 28.6 (*) 11.6 - 15.2 (seconds)    INR 2.64 (*) 0.00 - 1.49    URINALYSIS, ROUTINE W REFLEX  MICROSCOPIC     Status: Abnormal   Collection Time   03/14/11  3:20 PM      Component Value Range Comment   Color, Urine YELLOW  YELLOW     APPearance CLOUDY (*) CLEAR     Specific Gravity, Urine 1.025  1.005 - 1.030     pH 5.5  5.0 - 8.0     Glucose, UA NEGATIVE  NEGATIVE (mg/dL)    Hgb urine dipstick SMALL (*) NEGATIVE     Bilirubin Urine NEGATIVE  NEGATIVE     Ketones, ur NEGATIVE  NEGATIVE (mg/dL)    Protein, ur NEGATIVE  NEGATIVE (mg/dL)    Urobilinogen, UA 1.0  0.0 - 1.0 (mg/dL)    Nitrite NEGATIVE  NEGATIVE     Leukocytes, UA NEGATIVE  NEGATIVE    URINE MICROSCOPIC-ADD ON     Status: Normal   Collection Time   03/14/11  3:20 PM      Component Value Range Comment   RBC / HPF 0-2  <3 (RBC/hpf)    Crystals STARCH  NEGATIVE     Urine-Other MUCOUS PRESENT     SURGICAL PCR SCREEN     Status: Normal   Collection Time   03/14/11  3:21 PM      Component Value Range Comment   MRSA, PCR NEGATIVE  NEGATIVE     Staphylococcus aureus NEGATIVE  NEGATIVE     Dg Chest 2 View  03/14/2011  *RADIOLOGY REPORT*  Clinical Data: Preop x-ray  CHEST - 2 VIEW  Comparison: 06/18/2009  Findings: The heart size and mediastinal contours are within normal limits. Prior median sternotomy CABG procedure.  Both lungs are clear. Prior right shoulder arthroplasty.  IMPRESSION: No active cardiopulmonary abnormalities.  Original Report Authenticated By: Rosealee Albee, M.D.    ROS: Review of Systems  Constitutional: Negative.   HENT: Negative.   Eyes: Negative.   Respiratory: Negative.   Cardiovascular: Negative.   Gastrointestinal: Negative.   Genitourinary: Negative.   Musculoskeletal: Positive for myalgias and joint pain.  Skin: Negative.   Neurological: Negative.   Endo/Heme/Allergies: Negative.   Psychiatric/Behavioral: Negative.      Physican Exam: BP: 130/82 ; HR : 74 ; Resp: 16; Physical Exam  Constitutional: She is oriented to person, place, and time and well-developed, well-nourished,  and in no distress.  HENT:  Head: Normocephalic and atraumatic.  Nose: Nose normal.  Mouth/Throat: Oropharynx is clear and moist.  Eyes: Pupils are equal, round, and reactive to light.  Neck: Neck supple. No tracheal deviation present. No thyromegaly present.  Cardiovascular: Normal rate and regular rhythm.   Murmur (pronounced valve sound from previous surgery) heard. Pulmonary/Chest: Effort normal and breath sounds normal. No stridor. No respiratory distress. She has no wheezes. She has no rales. She exhibits no tenderness.  Abdominal: Soft. There is no tenderness. There is no guarding.  Musculoskeletal:       Right knee: She exhibits decreased range of motion (0-100, pain at extremes) and bony tenderness. She exhibits no swelling, no effusion, no ecchymosis, no deformity, no laceration and no erythema. tenderness found. Medial joint line tenderness noted.  Lymphadenopathy:  She has no cervical adenopathy.  Neurological: She is alert and oriented to person, place, and time.  Skin: Skin is warm and dry. No rash noted. No erythema. No pallor.  Psychiatric: Affect normal.      Assessment/Plan Assessment: right knee OA and pain   Plan: Patient will undergo a right total knee arthroplasty on 03/21/2011. Risks benefits and expectation were discussed with the patient. Patient understand risks, benefits and expectation and wishes to proceed.   Anastasio Auerbach Tashema Tiller   PAC  03/15/2011, 12:50 PM

## 2011-03-22 LAB — BASIC METABOLIC PANEL
BUN: 11 mg/dL (ref 6–23)
Calcium: 8.3 mg/dL — ABNORMAL LOW (ref 8.4–10.5)
Creatinine, Ser: 0.7 mg/dL (ref 0.50–1.10)
GFR calc non Af Amer: 86 mL/min — ABNORMAL LOW (ref >90)
Glucose, Bld: 105 mg/dL — ABNORMAL HIGH (ref 70–99)
Sodium: 139 mEq/L (ref 135–145)

## 2011-03-22 LAB — CBC
Hemoglobin: 8.5 g/dL — ABNORMAL LOW (ref 12.0–15.0)
MCH: 30.8 pg (ref 26.0–34.0)
MCHC: 33 g/dL (ref 30.0–36.0)

## 2011-03-22 MED ORDER — WARFARIN SODIUM 5 MG PO TABS
5.0000 mg | ORAL_TABLET | Freq: Once | ORAL | Status: AC
  Administered 2011-03-22: 5 mg via ORAL
  Filled 2011-03-22: qty 1

## 2011-03-22 NOTE — Progress Notes (Addendum)
Physical Therapy Treatment Patient Details Name: Paula Ferguson MRN: [id number] DOB: 05/28/1940 Today's Date: 03/22/2011  R TKR POD #1 pm session 14:00 - 14:20 1 te  PT Assessment/Plan  PT - Assessment/Plan Comments on Treatment Session: Pt very motivated and experienced having had a L Uni Knee.  Pt plans to D/C to home PT Plan: Discharge plan remains appropriate PT Frequency: 7X/week Recommendations for Other Services: OT consult Follow Up Recommendations: Home health PT Equipment Recommended: None recommended by PT PT Goals  Acute Rehab PT Goals PT Goal Formulation: With patient Time For Goal Achievement: 7 days Pt will go Supine/Side to Sit: with supervision PT Goal: Supine/Side to Sit - Progress: Progressing toward goal Pt will go Sit to Supine/Side: with supervision PT Goal: Sit to Supine/Side - Progress: Progressing toward goal Pt will go Sit to Stand: with supervision PT Goal: Sit to Stand - Progress: Progressing toward goal Pt will go Stand to Sit: with supervision PT Goal: Stand to Sit - Progress: Progressing toward goal Pt will Ambulate: 51 - 150 feet;with supervision;with rolling walker PT Goal: Ambulate - Progress: Progressing toward goal Pt will Go Up / Down Stairs: 3-5 stairs;with min assist;with least restrictive assistive device PT Goal: Up/Down Stairs - Progress: Progressing toward goal Pt will Perform Home Exercise Program: with supervision, verbal cues required/provided PT Goal: Perform Home Exercise Program - Progress: Progressing toward goal  PT Treatment Precautions/Restrictions  Precautions Precautions: Knee Required Braces or Orthoses: Yes Knee Immobilizer: Discontinue once straight leg raise with < 10 degree lag Restrictions Weight Bearing Restrictions: No EXERCISES 10 reps AROM AP 10 reps AROM Knee Preeses 10 reps AROM towel squeezes 5 reps AAROM heel slides 5 reps AAROM SAQ's 10 reps AAROM SLR End of Session PT - End of  Session Equipment Utilized During Treatment: Gait belt Activity Tolerance: Patient tolerated treatment well Patient left: in bed;with call bell in reach Nurse Communication: Mobility status for transfers (musc. relaxer) General Behavior During Session: [facility] for tasks performed Cognition: [facility] for tasks performed  Felecia Shelling  PTA [facility]  Acute  Rehab Pager     [phone]

## 2011-03-22 NOTE — Evaluation (Signed)
Physical Therapy Evaluation Patient Details Name: Paula Ferguson MRN: [id number] DOB: 10-19-1940 Today's Date: 03/22/2011 [id number] ev2 Problem List:  Patient Active Problem List  Diagnoses  . HYPOTHYROIDISM  . HYPERLIPIDEMIA  . ANEMIA  . HYPERTENSION  . CORONARY ARTERY DISEASE  . OSTEOARTHRITIS  . OLECRANON BURSITIS, LEFT  . LIVER FUNCTION TESTS, ABNORMAL, HX OF  . S/P right knee replacement    Past Medical History:  Past Medical History  Diagnosis Date  . ANEMIA 09/15/2009  . HYPERLIPIDEMIA 02/07/2007  . HYPERTENSION 09/14/2006  . HYPOTHYROIDISM 09/14/2006  . LIVER FUNCTION TESTS, ABNORMAL, HX OF 01/12/2009  . OLECRANON BURSITIS, LEFT 08/26/2007  . OSTEOARTHRITIS 02/07/2007  . Blood transfusion   . CORONARY ARTERY DISEASE 08/26/2007    LOV Dr Katrinka Blazing 1/13 with EKG and clearance on chart   Past Surgical History:  Past Surgical History  Procedure Date  . Cholecystectomy   . Abdominal hysterectomy   . Knee surgery   . Ankle surgery   . Wrist surgery   . Shoulder surgery   . Coronary artery bypass graft     with  aortic valve replacement 2003  . Cardiac valve replacement   . Cardiac catheterization     2003  . Joint replacement     right shoulder arthroscopy, left partial knee replacement  . Tonsillectomy   . Colonoscopy     PT Assessment/Plan/Recommendation PT Assessment Clinical Impression Statement: pt is s/p RTKA will benefit from PT to improve mobility, rom, strength to DC to home. Tolerated well, some dizziness, BP 135/78, sats 97 HR 90 after mobility. PT Recommendation/Assessment: Patient will need skilled PT in the acute care venue PT Problem List: Decreased strength;Decreased range of motion;Decreased activity tolerance;Decreased mobility;Decreased knowledge of use of DME;Decreased knowledge of precautions;Pain PT Therapy Diagnosis : Difficulty walking;Acute pain PT Plan PT Frequency: 7X/week PT Treatment/Interventions: DME instruction;Gait training;Stair  training;Functional mobility training;Therapeutic activities;Therapeutic exercise;Patient/family education PT Recommendation Recommendations for Other Services: OT consult Follow Up Recommendations: Home health PT Equipment Recommended: None recommended by PT PT Goals  Acute Rehab PT Goals PT Goal Formulation: With patient Time For Goal Achievement: 7 days Pt will go Supine/Side to Sit: with supervision PT Goal: Supine/Side to Sit - Progress: Goal set today Pt will go Sit to Supine/Side: with supervision PT Goal: Sit to Supine/Side - Progress: Goal set today Pt will go Sit to Stand: with supervision PT Goal: Sit to Stand - Progress: Goal set today Pt will go Stand to Sit: with supervision PT Goal: Stand to Sit - Progress: Goal set today Pt will Ambulate: 51 - 150 feet;with supervision;with rolling walker PT Goal: Ambulate - Progress: Goal set today Pt will Go Up / Down Stairs: 3-5 stairs;with min assist;with least restrictive assistive device PT Goal: Up/Down Stairs - Progress: Goal set today Pt will Perform Home Exercise Program: with supervision, verbal cues required/provided PT Goal: Perform Home Exercise Program - Progress: Goal set today  PT Evaluation Precautions/Restrictions  Precautions Precautions: Knee Required Braces or Orthoses: Yes Knee Immobilizer: Discontinue once straight leg raise with < 10 degree lag Restrictions Weight Bearing Restrictions: No Prior Functioning  Home Living Lives With: Alone Receives Help From: Family Type of Home: House Home Layout: Two level Home Access: Stairs to enter Entrance Stairs-Rails: Left Entrance Stairs-Number of Steps: 4 Additional Comments: pt has daughters who will be coming to care for pt Prior Function Level of Independence: Independent with basic ADLs Able to Take Stairs?: Yes Cognition Cognition Arousal/Alertness: Awake/alert Overall Cognitive Status: Appears within  functional limits for tasks assessed Orientation  Level: Oriented X4 Sensation/Coordination Sensation Light Touch: Appears Intact Coordination Gross Motor Movements are Fluid and Coordinated: Yes Extremity Assessment RLE AROM (degrees) RLE Overall AROM Comments: only 30degrees knee flex, unable to perform SLR RLE Strength RLE Overall Strength Comments: assist for SLR LLE Assessment LLE Assessment: Within Functional Limits Mobility (including Balance) Bed Mobility Bed Mobility: Yes Supine to Sit: 4: Min assist Supine to Sit Details (indicate cue type and reason): vc to use sheet to lift LE to move to EOB Transfers Transfers: Yes Sit to Stand: 4: Min assist;From elevated surface;With upper extremity assist;From bed Sit to Stand Details (indicate cue type and reason): vc for hand placement  Stand to Sit: 4: Min assist;With upper extremity assist;To chair/3-in-1;With armrests Stand to Sit Details: vc to reach back to armrests and place RLE forward Ambulation/Gait Ambulation/Gait: Yes Ambulation/Gait Assistance: 4: Min assist Ambulation/Gait Assistance Details (indicate cue type and reason): vc to use RW to pivot to [facility] Ambulation Distance (Feet): 5 Feet Assistive device: Rolling walker Gait Pattern: Step-to pattern    Exercise  Total Joint Exercises Quad Sets: AAROM;Right;10 reps;Supine Straight Leg Raises: AAROM;Right;5 reps;Supine (pt had urgency for BSC so stopped exer.) End of Session PT - End of Session Equipment Utilized During Treatment: Right knee immobilizer Activity Tolerance: Patient tolerated treatment well (c/o mildly dizzy) Patient left: in chair;with call bell in reach Nurse Communication: Mobility status for transfers (musc. relaxer) General Behavior During Session: [facility] for tasks performed Cognition: [facility] for tasks performed  Rada Hay 03/22/2011, 2:25 PM

## 2011-03-22 NOTE — Progress Notes (Signed)
Utilization review completed.  

## 2011-03-22 NOTE — Progress Notes (Signed)
ANTICOAGULATION CONSULT NOTE - Follow Up Consult  Pharmacy Consult for Coumadin Indication: Mechanical Valve  Allergies  Allergen Reactions  . Hydromorphone Hcl Itching  . Meperidine Hcl Itching  . Morphine Sulfate Itching    Patient Measurements: Height: 5\' 4"  (162.6 cm) Weight: 122 lb 12.4 oz (55.69 kg) IBW/kg (Calculated) : 54.7    Vital Signs: Temp: 98.7 F (37.1 C) (01/30 0445) Temp src: Oral (01/30 0445) BP: 100/61 mmHg (01/30 0445) Pulse Rate: 87  (01/30 0445)  Labs:  Basename 03/22/11 0424 03/21/11 1130 03/21/11 0950  HGB 8.5* -- 12.3  HCT 26.1* -- 36.9  PLT 119* -- 139*  APTT -- -- --  LABPROT 15.6* 14.0 13.8  INR 1.21 1.06 1.04  HEPARINUNFRC -- -- --  CREATININE 0.70 -- 0.65  CKTOTAL -- -- --  CKMB -- -- --  TROPONINI -- -- --   Estimated Creatinine Clearance: 57.3 ml/min (by C-G formula based on Cr of 0.7).   Medications:  Scheduled:    . buPROPion  150 mg Oral Daily  .  ceFAZolin (ANCEF) IV  1 g Intravenous 60 min Pre-Op  .  ceFAZolin (ANCEF) IV  1 g Intravenous Q6H  . docusate sodium  100 mg Oral BID  . enoxaparin  40 mg Subcutaneous Q24H  . ferrous sulfate  325 mg Oral TID PC  . HYDROcodone-acetaminophen  1-2 tablet Oral Q4H  . levothyroxine  100 mcg Oral QAC breakfast  . losartan  100 mg Oral Daily  . polyethylene glycol  17 g Oral BID  . TDaP  0.5 mL Intramuscular Once  . verapamil  240 mg Oral Daily  . warfarin  5 mg Oral Once  . DISCONTD: warfarin  5-7.5 mg Oral See admin instructions    Assessment:  71 year old female with h/o CABG with mechanical aortic valve replacement.   Patient s/p right TKR 1/29.   Prior to admission patient was on Coumadin 5mg  daily except 7.5mg  on Thursdays. Last dose of Coumadin was 03/15/11 in anticipation of surgery and bridge therapy with Lovenox 60mg  sq q12h was initiated. Last dose of Lovenox 60mg  was 03/19/10 around 8:30a.m.  Coumadin 5mg  resumed last night post op  Goal of Therapy:  INR 2-3     Plan:   Repeat coumadin 5mg  x1 today  Continue lovenox 40mg  sq q24h bridge per Dr. Charlann Boxer  Daily PT/INR  Loralee Pacas, PharmD, BCPS Pager: [phone] 03/22/2011,9:38 AM

## 2011-03-22 NOTE — Progress Notes (Signed)
OT Note:  Pt screened for OT.  She has DME for toilet and assist for ADLs.  She will sponge bathe until she can step into tub.  Screen only.  Craig, OTR/L [phone] 03/22/2011

## 2011-03-22 NOTE — Progress Notes (Signed)
Subjective: 1 Day Post-Op Procedure(s) (LRB): TOTAL KNEE ARTHROPLASTY (Right)   Patient reports pain as mild. No events.  Objective:   VITALS:   Filed Vitals:   03/22/11 0942  BP: 108/62  Pulse: 84  Temp: 98.6 F (37 C)  Resp: 18    Neurovascular intact Dorsiflexion/Plantar flexion intact Incision: dressing C/D/I No cellulitis present Compartment soft  LABS  Basename 03/22/11 0424 03/21/11 0950  HGB 8.5* 12.3  HCT 26.1* 36.9  WBC 5.1 3.7*  PLT 119* 139*     Basename 03/22/11 0424 03/21/11 0950  NA 139 136  K 4.0 3.8  BUN 11 12  CREATININE 0.70 0.65  GLUCOSE 105* 87     Assessment/Plan: 1 Day Post-Op Procedure(s) (LRB): TOTAL KNEE ARTHROPLASTY (Right)   Foley cath d/c'ed HV drain d/c'ed Advance diet Up with therapy D/C IV fluids Discharge home with home health upon discharge   Anastasio Auerbach. Chairty Toman   PAC  03/22/2011, 10:30 AM

## 2011-03-23 ENCOUNTER — Encounter (HOSPITAL_COMMUNITY): Payer: Self-pay | Admitting: Orthopedic Surgery

## 2011-03-23 LAB — CBC
Hemoglobin: 8.3 g/dL — ABNORMAL LOW (ref 12.0–15.0)
MCHC: 33.7 g/dL (ref 30.0–36.0)
RBC: 2.67 MIL/uL — ABNORMAL LOW (ref 3.87–5.11)

## 2011-03-23 LAB — BASIC METABOLIC PANEL
CO2: 25 mEq/L (ref 19–32)
GFR calc non Af Amer: >90 mL/min (ref >90)
Glucose, Bld: 107 mg/dL — ABNORMAL HIGH (ref 70–99)
Potassium: 4.1 mEq/L (ref 3.5–5.1)
Sodium: 136 mEq/L (ref 135–145)

## 2011-03-23 LAB — PROTIME-INR
INR: 1.18 (ref 0.00–1.49)
Prothrombin Time: 15.3 seconds — ABNORMAL HIGH (ref 11.6–15.2)

## 2011-03-23 MED ORDER — WARFARIN SODIUM 7.5 MG PO TABS
7.5000 mg | ORAL_TABLET | Freq: Once | ORAL | Status: AC
  Administered 2011-03-23: 7.5 mg via ORAL
  Filled 2011-03-23: qty 1

## 2011-03-23 NOTE — Progress Notes (Signed)
Physical Therapy Treatment Patient Details Name: Paula Ferguson MRN: [id number] DOB: [birth year] Today's Date: 03/23/2011  R TKR POD #2 pm session 14;55 - 15;15 1 gt  PT Assessment/Plan  PT - Assessment/Plan Comments on Treatment Session: Pt c/o poor sleep last night. PT Plan: Discharge plan remains appropriate Follow Up Recommendations: Home health PT Equipment Recommended: None recommended by PT PT Goals  Acute Rehab PT Goals PT Goal Formulation: With patient Pt will go Supine/Side to Sit: with supervision PT Goal: Supine/Side to Sit - Progress: Progressing toward goal Pt will go Sit to Supine/Side: with supervision PT Goal: Sit to Supine/Side - Progress: Progressing toward goal Pt will go Sit to Stand: with supervision PT Goal: Sit to Stand - Progress: Progressing toward goal Pt will go Stand to Sit: with supervision PT Goal: Stand to Sit - Progress: Progressing toward goal Pt will Ambulate: 51 - 150 feet;with supervision;with rolling walker PT Goal: Ambulate - Progress: Progressing toward goal Pt will Go Up / Down Stairs: 3-5 stairs;with min assist;with least restrictive assistive device PT Goal: Up/Down Stairs - Progress: Progressing toward goal Pt will Perform Home Exercise Program: with supervision, verbal cues required/provided PT Goal: Perform Home Exercise Program - Progress: Progressing toward goal  PT Treatment Precautions/Restrictions  Precautions Precautions: Knee Precaution Comments: Pt instructed on KI use and when to D/C Required Braces or Orthoses: Yes Knee Immobilizer: Discontinue once straight leg raise with < 10 degree lag Restrictions Weight Bearing Restrictions: No Other Position/Activity Restrictions: WBAT Mobility (including Balance) Bed Mobility Bed Mobility: Yes Supine to Sit: 4: Min assist Sit to Supine: 4: Min assist Transfers Transfers: Yes Sit to Stand: 4: Min assist;From bed Sit to Stand Details (indicate cue type and reason): 25%  VC's on hand placement and increased time Stand to Sit: 4: Min assist;To toilet;To bed Stand to Sit Details: Assisted to toilet and back to bed Ambulation/Gait Ambulation/Gait: Yes Ambulation/Gait Assistance: 4: Min assist Ambulation/Gait Assistance Details (indicate cue type and reason): 25% VC's on sequencing and safety with turns Ambulation Distance (Feet): 85 Feet Assistive device: Rolling walker Gait Pattern: Step-to pattern;Decreased stance time - right;Trunk flexed Stairs: No Wheelchair Mobility Wheelchair Mobility: No    End of Session PT - End of Session Equipment Utilized During Treatment: Gait belt Activity Tolerance: Patient tolerated treatment well Patient left: in bed;with call bell in reach General Behavior During Session: [facility] for tasks performed Cognition: [facility] for tasks performed  Felecia Shelling  PTA [facility]  Acute  Rehab Pager     [phone]

## 2011-03-23 NOTE — Progress Notes (Signed)
ANTICOAGULATION CONSULT NOTE - Follow Up Consult  Pharmacy Consult for Coumadin Indication: Mechanical Valve  Allergies  Allergen Reactions  . Hydromorphone Hcl Itching  . Meperidine Hcl Itching  . Morphine Sulfate Itching    Patient Measurements: Height: 5\' 4"  (162.6 cm) Weight: 122 lb 12.4 oz (55.69 kg) IBW/kg (Calculated) : 54.7    Vital Signs: Temp: 98.5 F (36.9 C) (01/31 0610) Temp src: Oral (01/31 0610) BP: 113/70 mmHg (01/31 0610) Pulse Rate: 93  (01/31 0610)  Labs:  Basename 03/23/11 0425 03/22/11 0424 03/21/11 1130 03/21/11 0950  HGB 8.3* 8.5* -- --  HCT 24.6* 26.1* -- 36.9  PLT 126* 119* -- 139*  APTT -- -- -- --  LABPROT 15.3* 15.6* 14.0 --  INR 1.18 1.21 1.06 --  HEPARINUNFRC -- -- -- --  CREATININE 0.50 0.70 -- 0.65  CKTOTAL -- -- -- --  CKMB -- -- -- --  TROPONINI -- -- -- --   Estimated Creatinine Clearance: 57.3 ml/min (by C-G formula based on Cr of 0.5).   Medications:  Scheduled:     . buPROPion  150 mg Oral Daily  . docusate sodium  100 mg Oral BID  . enoxaparin  40 mg Subcutaneous Q24H  . ferrous sulfate  325 mg Oral TID PC  . HYDROcodone-acetaminophen  1-2 tablet Oral Q4H  . levothyroxine  100 mcg Oral QAC breakfast  . losartan  100 mg Oral Daily  . polyethylene glycol  17 g Oral BID  . verapamil  240 mg Oral Daily  . warfarin  5 mg Oral ONCE-1800    Assessment:  71 year old female with h/o CABG with mechanical aortic valve replacement.   Patient s/p right TKR 1/29.   Prior to admission patient was on Coumadin 5mg  daily except 7.5mg  on Thursdays. Last dose of Coumadin was 03/15/11 in anticipation of surgery and bridge therapy with Lovenox 60mg  sq q12h was initiated. Last dose of Lovenox 60mg  was 03/19/10 around 8:30a.m.  Coumadin 5mg  given 1/29 and 1/30  INR remains < goal  Goal of Therapy:  INR 2-3   Plan:   Increase coumadin 7.5mg  x1 today  Continue lovenox 40mg  sq q24h bridge per Dr. Charlann Boxer; d/c when INR >/= 1.8 per MD  order  Daily PT/INR  Loralee Pacas, PharmD, BCPS Pager: [phone] 03/23/2011,8:31 AM

## 2011-03-23 NOTE — Progress Notes (Signed)
Physical Therapy Treatment Patient Details Name: Paula Ferguson MRN: [id number] DOB: Jul 29, 1940 Today's Date: 03/23/2011  R TKR POD#2 am session 10:50 - 11;20 1 gt  1 te  1 ta  PT Assessment/Plan  PT - Assessment/Plan Comments on Treatment Session: Pt c/o poor sleep last night. PT Plan: Discharge plan remains appropriate Follow Up Recommendations: Home health PT Equipment Recommended: None recommended by PT PT Goals  Acute Rehab PT Goals PT Goal Formulation: With patient Pt will go Supine/Side to Sit: with supervision PT Goal: Supine/Side to Sit - Progress: Progressing toward goal Pt will go Sit to Supine/Side: with supervision PT Goal: Sit to Supine/Side - Progress: Progressing toward goal Pt will go Sit to Stand: with supervision PT Goal: Sit to Stand - Progress: Progressing toward goal Pt will go Stand to Sit: with supervision PT Goal: Stand to Sit - Progress: Progressing toward goal Pt will Ambulate: 51 - 150 feet;with supervision;with rolling walker PT Goal: Ambulate - Progress: Progressing toward goal Pt will Go Up / Down Stairs: 3-5 stairs;with min assist;with least restrictive assistive device PT Goal: Up/Down Stairs - Progress: Progressing toward goal Pt will Perform Home Exercise Program: with supervision, verbal cues required/provided PT Goal: Perform Home Exercise Program - Progress: Progressing toward goal  PT Treatment Precautions/Restrictions  Precautions Precautions: Knee Precaution Comments: Pt instructed on KI use and when to D/C Required Braces or Orthoses: Yes Knee Immobilizer: Discontinue once straight leg raise with < 10 degree lag Restrictions Weight Bearing Restrictions: No Other Position/Activity Restrictions: WBAT Mobility (including Balance) Bed Mobility Bed Mobility: Yes Supine to Sit: 4: Min assist Sit to Supine: 4: Min assist Transfers Transfers: Yes Sit to Stand: 4: Min assist;From bed Sit to Stand Details (indicate cue type and  reason): 25% VC's on hand placement and increased time Stand to Sit: 4: Min assist;To bed;To chair/3-in-1 Stand to Sit Details: 25% VC's on hand placement and increased time Ambulation/Gait Ambulation/Gait: Yes Ambulation/Gait Assistance: 4: Min assist Ambulation/Gait Assistance Details (indicate cue type and reason): 25% VC's on sequencing and safety with turns Ambulation Distance (Feet): 55 Feet Assistive device: Rolling walker Gait Pattern: Step-to pattern;Decreased stance time - right Stairs: No Wheelchair Mobility Wheelchair Mobility: No    Exercise  Total Joint Exercises Ankle Circles/Pumps: AROM;Both;10 reps Quad Sets: AROM;Both;10 reps Gluteal Sets: AROM;Both;10 reps Towel Squeeze: AROM;Both;10 reps Short Arc QuadBarbaraann Boys;Right;5 reps Heel Slides: AAROM;Right;10 reps Hip ABduction/ADduction: AAROM;Right;10 reps Straight Leg Raises: AAROM;Right;10 reps End of Session PT - End of Session Equipment Utilized During Treatment: Gait belt Activity Tolerance: Patient tolerated treatment well Patient left: in bed;with call bell in reach General Behavior During Session: [facility] for tasks performed Cognition: [facility] for tasks performed  Felecia Shelling  PTA [facility]  Acute  Rehab Pager     [phone]

## 2011-03-23 NOTE — Progress Notes (Signed)
Subjective: 2 Days Post-Op Procedure(s) (LRB): TOTAL KNEE ARTHROPLASTY (Right)   Patient reports pain as mild. No events.  Objective:   VITALS:   Filed Vitals:   03/23/11 0610  BP: 113/70  Pulse: 93  Temp: 98.5 F (36.9 C)  Resp: 19    Neurovascular intact Dorsiflexion/Plantar flexion intact Incision: dressing C/D/I No cellulitis present Compartment soft  LABS  Basename 03/23/11 0425 03/22/11 0424 03/21/11 0950  HGB 8.3* 8.5* 12.3  HCT 24.6* 26.1* 36.9  WBC 7.2 5.1 3.7*  PLT 126* 119* 139*     Basename 03/23/11 0425 03/22/11 0424 03/21/11 0950  NA 136 139 136  K 4.1 4.0 3.8  BUN 6 11 12   CREATININE 0.50 0.70 0.65  GLUCOSE 107* 105* 87    Assessment/Plan: 2 Days Post-Op Procedure(s) (LRB): TOTAL KNEE ARTHROPLASTY (Right)  D/c IV Up with therapy Plan for discharge tomorrow to home with HHPT   Anastasio Auerbach. Deandria Klute   PAC  03/23/2011, 10:04 AM

## 2011-03-24 LAB — PROTIME-INR: Prothrombin Time: 17.1 seconds — ABNORMAL HIGH (ref 11.6–15.2)

## 2011-03-24 MED ORDER — ENOXAPARIN SODIUM 40 MG/0.4ML ~~LOC~~ SOLN: 40.0000 mg | Freq: Two times a day (BID) | SUBCUTANEOUS | Status: DC

## 2011-03-24 MED ORDER — WARFARIN SODIUM 5 MG PO TABS: 5.0000 mg | ORAL_TABLET | ORAL | Status: DC

## 2011-03-24 MED ORDER — DSS 100 MG PO CAPS: 100.0000 mg | ORAL_CAPSULE | Freq: Two times a day (BID) | ORAL | Status: AC

## 2011-03-24 MED ORDER — METHOCARBAMOL 500 MG PO TABS: 500.0000 mg | ORAL_TABLET | Freq: Four times a day (QID) | ORAL | Status: AC | PRN

## 2011-03-24 MED ORDER — FERROUS SULFATE 325 (65 FE) MG PO TABS: 325.0000 mg | ORAL_TABLET | Freq: Three times a day (TID) | ORAL | Status: DC

## 2011-03-24 MED ORDER — POLYETHYLENE GLYCOL 3350 17 G PO PACK: 17.0000 g | PACK | Freq: Two times a day (BID) | ORAL | Status: AC

## 2011-03-24 MED ORDER — HYDROCODONE-ACETAMINOPHEN 7.5-325 MG PO TABS: 1.0000 | ORAL_TABLET | ORAL | Status: AC

## 2011-03-24 MED ORDER — DIPHENHYDRAMINE HCL 25 MG PO CAPS: 25.0000 mg | ORAL_CAPSULE | Freq: Four times a day (QID) | ORAL | Status: AC | PRN

## 2011-03-24 NOTE — Progress Notes (Signed)
  CARE MANAGEMENT NOTE 03/24/2011  Patient:  Paula Ferguson, Paula Ferguson   Account Number:  [id number]  Date Initiated:  03/24/2011  Documentation initiated by:  Colleen Can  Subjective/Objective Assessment:   DX OSTEOARTHRITIS RIGHT KNEE: TOTAL KNEE REPLACEMNT     Action/Plan:   Patient states she is planning to go home  in [place] where her daughters and granddaughter will be  caregivers. She already has RW, crutches. Requesting [facility] as choice HH agency for services needed at home   Anticipated DC Date:  03/24/2011   Anticipated DC Plan:  HOME W HOME HEALTH SERVICES  In-house referral  NA      DC Planning Services  CM consult      [facility] Choice  HOME HEALTH   Choice offered to / List presented to:  C-1 Patient   DME arranged  NA      DME agency  NA     HH arranged  HH-1 RN  HH-2 PT      [facility] agency  [facility]   Status of service:  Completed, signed off Medicare Important Message given?  NA - LOS <3 / Initial given by admissions (If response is "NO", the following Medicare IM given date fields will be blank) Date Medicare IM given:   Date Additional Medicare IM given:    Discharge Disposition:  HOME W HOME HEALTH SERVICES  Per UR Regulation:    Comments:  Pt has used [facility] in the past and wishes to use again. List of HH agencies palced in shadow chart. Pt will need 3N1. [name] will arrange for dme and have delivered to patient's room prior to discharge. Pt for discharge today. Home health services to start tomorrow. Pt will require home blood drws.

## 2011-03-24 NOTE — Discharge Summary (Signed)
Physician Discharge Summary  Patient ID: Paula Ferguson MRN: [id number] DOB/AGE: November 17, 1940 71 y.o.  Admit date: 03/21/2011 Discharge date: 03/24/2011  Procedures:  Procedure(s) (LRB): TOTAL KNEE ARTHROPLASTY (Right)  Attending Physician: Shelda Pal, MD   Admission Diagnoses: Right knee OA and pain   Discharge Diagnoses:  Principal Problem:  *S/P right knee replacement ANEMIA  HYPERLIPIDEMIA   HYPERTENSION   HYPOTHYROIDISM   OSTEOARTHRITIS  CORONARY ARTERY DISEASE - LOV Dr Katrinka Blazing 1/13 with EKG and clearance on chart   HPI: Pt is a 71 y.o. female complaining of right knee pain for many years. Pain had continually increased since the beginning. Pt had pain in both knees and had a UKR of the left knee in 2006, at that time she was told that she would have to have some kind of replacement of the right knee. She was putting it off as much as possible, but feels that it is really effecting her quality of life. X-rays in the clinic show end-stage arthritic changes of the right knee. Pt has tried various conservative treatments which have failed to alleviate their symptoms, including occasional NSAIDs and steroid injections. She had a right knee arthroscopy in 2012, at which time she states it showed bone-on-bone changes. Various options are discussed with the patient. Risks, benefits and expectations were discussed with the patient. Patient understand the risks, benefits and expectations and wishes to proceed with surgery.    PCP: Rogelia Boga, MD, MD   Discharged Condition: good  Hospital Course:  Patient underwent the above stated procedure on 03/21/2011. Patient tolerated the procedure well and brought to the recovery room in good condition and subsequently to the floor.  POD #1 BP: 108/62 ; Pulse: 84 ; Temp: 98.6 F (37 C) ; Resp: 18  Pt's foley was removed, as well as the hemovac drain removed. IV was changed to a saline lock. Patient reports pain as mild. No  events. Neurovascular intact, dorsiflexion/plantar flexion intact, incision: dressing C/D/I, no cellulitis present and compartment soft.  LABS  Basename  03/22/11 0424    HGB  8.5*    HCT  26.1*     INR:        1.21  POD #2  BP: 113/70 ; Pulse: 93 ; Temp: 98.5 F (36.9 C) ; Resp: 19 Patient reports pain as mild. No events. Neurovascular intact, dorsiflexion/plantar flexion intact, incision: dressing C/D/I, no cellulitis present and compartment soft.  LABS  Basename  03/23/11 0425    HGB  8.3*    HCT  24.6*     INR:        1.18  POD #3  BP: 109/70 ; Pulse: 100 ; Temp: 98.6 F (37 C) ; Resp: 18 Patient reports pain as mild. Pt knows the her INR is low for what she is suppose to stay at. No other events. Other than that she feels ready to be discharged to go home. No other events. Discussed the pt with the pharmacist that typically adjust and monitors her INR Kateri Plummer). After a through discussion he wishes to send the pt home on Lovenox 40 SQ bid, and for the pt to take a Coumadin 7.5 today. [name] will see the pt on 03/25/11 to monitor INR and be in contact with Kateri Plummer with the results.   LABS  INR:       1.37  Discharge Exam: Extremities: Homans sign is negative, no sign of DVT, no edema, redness or tenderness in the calves or thighs and  no ulcers, gangrene or trophic changes  Disposition: Home with Home Health PT & RN    with follow up in 2 weeks  Follow-up Information    Follow up with OLIN,Krystl Wickware D in 2 weeks.   Contact information:   [facility] [address] [place] [address] [phone]         Discharge Orders    Future Orders Please Complete By Expires   Diet - low sodium heart healthy      Call MD / Call 911      Comments:   If you experience chest pain or shortness of breath, CALL 911 and be transported to the hospital emergency room.  If you develope a fever above 101 F, pus (white drainage) or  increased drainage or redness at the wound, or calf pain, call your surgeon's office.   Constipation Prevention      Comments:   Drink plenty of fluids.  Prune juice may be helpful.  You may use a stool softener, such as Colace (over the counter) 100 mg twice a day.  Use MiraLax (over the counter) for constipation as needed.   Increase activity slowly as tolerated      Weight Bearing as taught in Physical Therapy      Comments:   Use a walker or crutches as instructed.   Driving restrictions      Comments:   No driving for 4 weeks   TED hose      Comments:   Use stockings (TED hose) for 2 weeks on both leg(s).  You may remove them at night for sleeping.   Change dressing      Comments:   Maintain surgical dressing for 8 days, then change the dressing daily with sterile 4 x 4 inch gauze dressing and tape. Keep the area dry and clean.   Discharge instructions      Comments:   Maintain surgical dressing for 8 days, then replace with gauze and tape. Keep the area dry and clean until follow up. Follow up in 2 weeks at [facility]. Call with any questions or concerns.  Per [employer], pt is to go onto Lovenox 40 mg SQ bid, and to take a Coumadin 7.5 mg today. INR will then be monitored by [name] through contact with [employer].         Current Discharge Medication List    START taking these medications   Details  diphenhydrAMINE (BENADRYL) 25 mg capsule Take 1 capsule (25 mg total) by mouth every 6 (six) hours as needed for itching, allergies or sleep.    docusate sodium 100 MG CAPS Take 100 mg by mouth 2 (two) times daily.    HYDROcodone-acetaminophen (NORCO) 7.5-325 MG per tablet Take 1-2 tablets by mouth every 4 (four) hours. Qty: 120 tablet, Refills: 0    methocarbamol (ROBAXIN) 500 MG tablet Take 1 tablet (500 mg total) by mouth every 6 (six) hours as needed (muscle spasms).    polyethylene glycol (MIRALAX / GLYCOLAX) packet Take 17 g by mouth 2 (two) times  daily.      CONTINUE these medications which have CHANGED   Details  enoxaparin (LOVENOX) 40 MG/0.4ML SOLN Inject 0.4 mLs (40 mg total) into the skin every 12 (twelve) hours. Qty: 14 Syringe, Refills: 0    ferrous sulfate 325 (65 FE) MG tablet Take 1 tablet (325 mg total) by mouth 3 (three) times daily after meals.    warfarin (COUMADIN) 5 MG  tablet Take 1-1.5 tablets (5-7.5 mg total) by mouth See admin instructions. 7.5 MG ON Thursday AND 5 MG ALL OTHER DAYS   Comments: Per Kateri Plummer, whom typically monitors the pt's INR, take a Coumadin 7.5 mg on 03/24/11. Then adjusted per him in communication with [place].      CONTINUE these medications which have NOT CHANGED   Details  buPROPion (WELLBUTRIN SR) 150 MG 12 hr tablet Take 150 mg by mouth daily.     Calcium Citrate-Vitamin D (CITRACAL PETITES/VITAMIN D PO) Take 2 tablets by mouth daily.    cholecalciferol (VITAMIN D) 1000 UNITS tablet Take 1,000 Units by mouth daily.    levothyroxine (SYNTHROID, LEVOTHROID) 100 MCG tablet Take 100 mcg by mouth daily.     losartan (COZAAR) 100 MG tablet Take 100 mg by mouth daily.     Multiple Vitamins-Minerals (CENTRUM PO) Take 1 tablet by mouth daily.    traZODone (DESYREL) 100 MG tablet Take 100 mg by mouth at bedtime.     verapamil (CALAN-SR) 240 MG CR tablet Take 240 mg by mouth every morning.      STOP taking these medications     HYDROcodone-acetaminophen (VICODIN) 5-500 MG per tablet Comments:  Reason for Stopping:          Follow-up Information    Follow up with Rogelia Boga, MD .         Signed: Anastasio Auerbach. Karizma Cheek   PAC  03/24/2011, 9:15 AM

## 2011-03-24 NOTE — Progress Notes (Signed)
Physical Therapy Treatment Patient Details Name: Paula Ferguson MRN: [id number] DOB: 11/06/1940 Today's Date: 03/24/2011  R TKR POD #3 10:30 - 11:00 1 gt  1ta PT Assessment/Plan  PT - Assessment/Plan Comments on Treatment Session: Pt plans to D/C to home today PT Plan: Discharge plan remains appropriate Follow Up Recommendations: Home health PT Equipment Recommended: Other (comment) (Pt given and fitted for crutches to use for stairs) PT Goals  Acute Rehab PT Goals PT Goal Formulation: With patient Pt will go Supine/Side to Sit: with supervision PT Goal: Supine/Side to Sit - Progress: Progressing toward goal Pt will go Sit to Supine/Side: with supervision PT Goal: Sit to Supine/Side - Progress: Progressing toward goal Pt will go Sit to Stand: with supervision PT Goal: Sit to Stand - Progress: Progressing toward goal Pt will go Stand to Sit: with supervision PT Goal: Stand to Sit - Progress: Progressing toward goal Pt will Ambulate: 51 - 150 feet;with supervision;with rolling walker PT Goal: Ambulate - Progress: Progressing toward goal Pt will Go Up / Down Stairs: 3-5 stairs;with min assist;with least restrictive assistive device PT Goal: Up/Down Stairs - Progress: Progressing toward goal Pt will Perform Home Exercise Program: with supervision, verbal cues required/provided PT Goal: Perform Home Exercise Program - Progress: Progressing toward goal  PT Treatment Precautions/Restrictions  Precautions Precautions: Knee Precaution Comments: Pt instructed on KI use and when to D/C Required Braces or Orthoses: Yes Knee Immobilizer: Discontinue once straight leg raise with < 10 degree lag Restrictions Weight Bearing Restrictions: No Other Position/Activity Restrictions: WBAT Mobility (including Balance) Bed Mobility Bed Mobility: Yes Supine to Sit: 5: Supervision Supine to Sit Details (indicate cue type and reason): Pt uses B UE to assist R LE off bed Sit to Supine: 5:  Supervision Sit to Supine - Details (indicate cue type and reason): Pt uses B UE to assist R LE up in bed Transfers Transfers: Yes Sit to Stand: 5: Supervision;From bed Sit to Stand Details (indicate cue type and reason): increased time Stand to Sit: 5: Supervision;To bed Stand to Sit Details: increased time Ambulation/Gait Ambulation/Gait: Yes Ambulation/Gait Assistance: 5: Supervision Ambulation/Gait Assistance Details (indicate cue type and reason): with KI and increased time with mild c/o fatigue Ambulation Distance (Feet): 85 Feet Assistive device: Rolling walker Gait Pattern: Step-to pattern;Decreased stance time - right;Trunk flexed Stairs: Yes Stairs Assistance: 4: Min assist Stairs Assistance Details (indicate cue type and reason): 50% VC's on proper sequencing and safety Stair Management Technique: One rail Right;With crutches Number of Stairs: 4  Wheelchair Mobility Wheelchair Mobility: No    Exercise    End of Session PT - End of Session Equipment Utilized During Treatment: Gait belt Activity Tolerance: Patient tolerated treatment well Patient left: in bed;with call bell in reach Nurse Communication: Other (comment) (Pt ready for D/C to home) General Behavior During Session: [facility] for tasks performed Cognition: [facility] for tasks performed  Felecia Shelling  PTA [facility]  Acute  Rehab Pager     [phone]

## 2011-03-24 NOTE — Progress Notes (Signed)
Subjective: 3 Days Post-Op Procedure(s) (LRB): TOTAL KNEE ARTHROPLASTY (Right)   Patient reports pain as mild. Pt knows the her INR is low for what she is suppose to stay at. No other events. Other than that she feels ready to be discharged to go home. No other events.  Objective:   VITALS:   Filed Vitals:   03/24/11 0620  BP: 109/70  Pulse: 100  Temp: 98.6 F (37 C)  Resp: 18    Neurovascular intact Dorsiflexion/Plantar flexion intact Incision: dressing C/D/I No cellulitis present Compartment soft  LABS  Basename 03/23/11 0425 03/22/11 0424 03/21/11 0950  HGB 8.3* 8.5* 12.3  HCT 24.6* 26.1* 36.9  WBC 7.2 5.1 3.7*  PLT 126* 119* 139*     Basename 03/23/11 0425 03/22/11 0424 03/21/11 0950  NA 136 139 136  K 4.1 4.0 3.8  BUN 6 11 12   CREATININE 0.50 0.70 0.65  GLUCOSE 107* 105* 87     Assessment/Plan: 3 Days Post-Op Procedure(s) (LRB): TOTAL KNEE ARTHROPLASTY (Right)   Up with therapy Discharge home with home health PT and RN Discussed the pt with the pharmacist that typically adjust and monitors her INR Kateri Plummer). After a through discussion he wishes to send the pt home on Lovenox 40 SQ bid, and for the pt to take a Coumadin 7.5 today. [name] will see the pt on 03/25/11 to monitor INR and be in contact with Kateri Plummer with the results.   Anastasio Auerbach Marielys Trinidad   PAC  03/24/2011, 8:12 AM

## 2011-03-25 DIAGNOSIS — Z96659 Presence of unspecified artificial knee joint: Secondary | ICD-10-CM | POA: Diagnosis not present

## 2011-03-25 DIAGNOSIS — D649 Anemia, unspecified: Secondary | ICD-10-CM | POA: Diagnosis not present

## 2011-03-25 DIAGNOSIS — I10 Essential (primary) hypertension: Secondary | ICD-10-CM | POA: Diagnosis not present

## 2011-03-25 DIAGNOSIS — Z5181 Encounter for therapeutic drug level monitoring: Secondary | ICD-10-CM | POA: Diagnosis not present

## 2011-03-25 DIAGNOSIS — Z471 Aftercare following joint replacement surgery: Secondary | ICD-10-CM | POA: Diagnosis not present

## 2011-03-25 DIAGNOSIS — M25519 Pain in unspecified shoulder: Secondary | ICD-10-CM | POA: Diagnosis not present

## 2011-05-03 DIAGNOSIS — M1710 Unilateral primary osteoarthritis, unspecified knee: Secondary | ICD-10-CM | POA: Diagnosis not present

## 2011-05-08 DIAGNOSIS — M1710 Unilateral primary osteoarthritis, unspecified knee: Secondary | ICD-10-CM | POA: Diagnosis not present

## 2011-05-11 DIAGNOSIS — M25519 Pain in unspecified shoulder: Secondary | ICD-10-CM | POA: Diagnosis not present

## 2011-05-18 DIAGNOSIS — Z954 Presence of other heart-valve replacement: Secondary | ICD-10-CM | POA: Diagnosis not present

## 2011-05-18 DIAGNOSIS — Z7901 Long term (current) use of anticoagulants: Secondary | ICD-10-CM | POA: Diagnosis not present

## 2011-05-23 DIAGNOSIS — M1710 Unilateral primary osteoarthritis, unspecified knee: Secondary | ICD-10-CM | POA: Diagnosis not present

## 2011-05-24 DIAGNOSIS — L821 Other seborrheic keratosis: Secondary | ICD-10-CM | POA: Diagnosis not present

## 2011-05-24 DIAGNOSIS — L819 Disorder of pigmentation, unspecified: Secondary | ICD-10-CM | POA: Diagnosis not present

## 2011-05-24 DIAGNOSIS — D239 Other benign neoplasm of skin, unspecified: Secondary | ICD-10-CM | POA: Diagnosis not present

## 2011-06-01 DIAGNOSIS — S8000XA Contusion of unspecified knee, initial encounter: Secondary | ICD-10-CM | POA: Diagnosis not present

## 2011-06-01 DIAGNOSIS — Z7901 Long term (current) use of anticoagulants: Secondary | ICD-10-CM | POA: Diagnosis not present

## 2011-06-01 DIAGNOSIS — M25519 Pain in unspecified shoulder: Secondary | ICD-10-CM | POA: Diagnosis not present

## 2011-06-01 DIAGNOSIS — Z954 Presence of other heart-valve replacement: Secondary | ICD-10-CM | POA: Diagnosis not present

## 2011-06-01 DIAGNOSIS — M25569 Pain in unspecified knee: Secondary | ICD-10-CM | POA: Diagnosis not present

## 2011-06-13 DIAGNOSIS — H5210 Myopia, unspecified eye: Secondary | ICD-10-CM | POA: Diagnosis not present

## 2011-06-13 DIAGNOSIS — H52229 Regular astigmatism, unspecified eye: Secondary | ICD-10-CM | POA: Diagnosis not present

## 2011-06-13 DIAGNOSIS — H1045 Other chronic allergic conjunctivitis: Secondary | ICD-10-CM | POA: Diagnosis not present

## 2011-06-16 ENCOUNTER — Other Ambulatory Visit: Payer: Self-pay | Admitting: Internal Medicine

## 2011-06-16 NOTE — Telephone Encounter (Signed)
Pt is on coumadin needing abx for dental procedure([facility]) pt also would like a refill on hydrocodone call [facility] [address]

## 2011-06-16 NOTE — Telephone Encounter (Signed)
Spoke with pt- informed dentisit will rx any abx he feels she needs post procedure.  Need to know when to stop coumadin? Also wants Rf on vicodin 5-500 Last seen 08/2010  Last written 08/15/10 # 90 3Rf Please advise Pt aware doctor out of office until Monday

## 2011-06-19 NOTE — Telephone Encounter (Signed)
Needs rov 

## 2011-06-19 NOTE — Telephone Encounter (Addendum)
Pt is sch 07-18-2011

## 2011-06-19 NOTE — Telephone Encounter (Signed)
Please schedule rov for med RFs - last seen 08/2010

## 2011-06-29 DIAGNOSIS — Z7901 Long term (current) use of anticoagulants: Secondary | ICD-10-CM | POA: Diagnosis not present

## 2011-06-29 DIAGNOSIS — Z954 Presence of other heart-valve replacement: Secondary | ICD-10-CM | POA: Diagnosis not present

## 2011-07-18 ENCOUNTER — Ambulatory Visit (INDEPENDENT_AMBULATORY_CARE_PROVIDER_SITE_OTHER): Payer: Medicare Other | Admitting: Internal Medicine

## 2011-07-18 ENCOUNTER — Encounter: Payer: Self-pay | Admitting: Internal Medicine

## 2011-07-18 VITALS — BP 120/72 | Temp 98.0°F | Wt 123.0 lb

## 2011-07-18 DIAGNOSIS — I10 Essential (primary) hypertension: Secondary | ICD-10-CM

## 2011-07-18 DIAGNOSIS — E039 Hypothyroidism, unspecified: Secondary | ICD-10-CM | POA: Diagnosis not present

## 2011-07-18 DIAGNOSIS — I2510 Atherosclerotic heart disease of native coronary artery without angina pectoris: Secondary | ICD-10-CM

## 2011-07-18 DIAGNOSIS — E785 Hyperlipidemia, unspecified: Secondary | ICD-10-CM | POA: Diagnosis not present

## 2011-07-18 DIAGNOSIS — M1990 Unspecified osteoarthritis, unspecified site: Secondary | ICD-10-CM

## 2011-07-18 MED ORDER — VERAPAMIL HCL ER 240 MG PO TBCR: 240.0000 mg | EXTENDED_RELEASE_TABLET | ORAL | Status: DC

## 2011-07-18 MED ORDER — TRAZODONE HCL 100 MG PO TABS: 100.0000 mg | ORAL_TABLET | Freq: Every day | ORAL | Status: DC

## 2011-07-18 MED ORDER — WARFARIN SODIUM 5 MG PO TABS: 5.0000 mg | ORAL_TABLET | ORAL | Status: DC

## 2011-07-18 MED ORDER — LEVOTHYROXINE SODIUM 100 MCG PO TABS: 100.0000 ug | ORAL_TABLET | Freq: Every day | ORAL | Status: DC

## 2011-07-18 MED ORDER — BUPROPION HCL ER (SR) 150 MG PO TB12: 150.0000 mg | ORAL_TABLET | Freq: Every day | ORAL | Status: DC

## 2011-07-18 MED ORDER — LOSARTAN POTASSIUM 100 MG PO TABS: 100.0000 mg | ORAL_TABLET | Freq: Every day | ORAL | Status: DC

## 2011-07-18 MED ORDER — HYDROCODONE-ACETAMINOPHEN 5-500 MG PO TABS: 1.0000 | ORAL_TABLET | Freq: Four times a day (QID) | ORAL | Status: DC | PRN

## 2011-07-18 NOTE — Patient Instructions (Signed)
Limit your sodium (Salt) intake    It is important that you exercise regularly, at least 20 minutes 3 to 4 times per week.  If you develop chest pain or shortness of breath seek  medical attention.  Please check your blood pressure on a regular basis.  If it is consistently greater than 150/90, please make an office appointment.  Return in 6 months for follow-up   

## 2011-07-18 NOTE — Progress Notes (Signed)
  Subjective:    Patient ID: Paula Ferguson, female    DOB: [birth year], 71 y.o.   MRN: [id number]  HPI  71 year old patient who is seen today for followup of her multiple medical problems this includes coronary artery disease, aortic valve disease status post aVR. She remains on chronic Coumadin anticoagulation. She has treated hypertension. She has a history of anxiety and depression. She has treated hypothyroidism and last TSH was normal laboratory studies reviewed she has dyslipidemia which has been managed off medication. Her main complaint is significant the osteoarthritic concerns. She is followed closely by orthopedics    Review of Systems  Constitutional: Negative.   HENT: Negative for hearing loss, congestion, sore throat, rhinorrhea, dental problem, sinus pressure and tinnitus.   Eyes: Negative for pain, discharge and visual disturbance.  Respiratory: Negative for cough and shortness of breath.   Cardiovascular: Negative for chest pain, palpitations and leg swelling.  Gastrointestinal: Negative for nausea, vomiting, abdominal pain, diarrhea, constipation, blood in stool and abdominal distention.  Genitourinary: Negative for dysuria, urgency, frequency, hematuria, flank pain, vaginal bleeding, vaginal discharge, difficulty urinating, vaginal pain and pelvic pain.  Musculoskeletal: Positive for back pain and arthralgias. Negative for joint swelling and gait problem.  Skin: Negative for rash.  Neurological: Negative for dizziness, syncope, speech difficulty, weakness, numbness and headaches.  Hematological: Negative for adenopathy.  Psychiatric/Behavioral: Negative for behavioral problems, dysphoric mood and agitation. The patient is not nervous/anxious.        Objective:   Physical Exam  Constitutional: She is oriented to person, place, and time. She appears well-developed and well-nourished.       Blood pressure low normal  HENT:  Head: Normocephalic.  Right Ear: External ear  normal.  Left Ear: External ear normal.  Mouth/Throat: Oropharynx is clear and moist.  Eyes: Conjunctivae and EOM are normal. Pupils are equal, round, and reactive to light.  Neck: Normal range of motion. Neck supple. No thyromegaly present.  Cardiovascular: Normal rate, regular rhythm and intact distal pulses.        Prosthetic heart sounds Sternotomy scar  Pulmonary/Chest: Effort normal and breath sounds normal.  Abdominal: Soft. Bowel sounds are normal. She exhibits no mass. There is no tenderness.  Musculoskeletal: Normal range of motion.       Marked  osteoarthritic changes  Lymphadenopathy:    She has no cervical adenopathy.  Neurological: She is alert and oriented to person, place, and time.  Skin: Skin is warm and dry. No rash noted.  Psychiatric: She has a normal mood and affect. Her behavior is normal.          Assessment & Plan:   Osteoarthritis Aortic valve repair chronic Coumadin anticoagulation Coronary artery disease stable Hypertension well controlled History of anxiety depression stable  All medications refilled Recheck in the fall

## 2011-07-31 DIAGNOSIS — Z954 Presence of other heart-valve replacement: Secondary | ICD-10-CM | POA: Diagnosis not present

## 2011-07-31 DIAGNOSIS — Z7901 Long term (current) use of anticoagulants: Secondary | ICD-10-CM | POA: Diagnosis not present

## 2011-08-10 DIAGNOSIS — M25519 Pain in unspecified shoulder: Secondary | ICD-10-CM | POA: Diagnosis not present

## 2011-09-07 DIAGNOSIS — M19049 Primary osteoarthritis, unspecified hand: Secondary | ICD-10-CM | POA: Diagnosis not present

## 2011-09-08 DIAGNOSIS — Z954 Presence of other heart-valve replacement: Secondary | ICD-10-CM | POA: Diagnosis not present

## 2011-09-08 DIAGNOSIS — Z7901 Long term (current) use of anticoagulants: Secondary | ICD-10-CM | POA: Diagnosis not present

## 2011-09-11 ENCOUNTER — Encounter: Payer: Self-pay | Admitting: Internal Medicine

## 2011-09-11 ENCOUNTER — Ambulatory Visit (INDEPENDENT_AMBULATORY_CARE_PROVIDER_SITE_OTHER): Payer: Medicare Other | Admitting: Internal Medicine

## 2011-09-11 VITALS — BP 120/70 | HR 76 | Temp 98.3°F | Resp 18 | Ht 61.5 in | Wt 127.0 lb

## 2011-09-11 DIAGNOSIS — Z7901 Long term (current) use of anticoagulants: Secondary | ICD-10-CM

## 2011-09-11 DIAGNOSIS — Z9889 Other specified postprocedural states: Secondary | ICD-10-CM | POA: Diagnosis not present

## 2011-09-11 DIAGNOSIS — Z5181 Encounter for therapeutic drug level monitoring: Secondary | ICD-10-CM

## 2011-09-11 DIAGNOSIS — E785 Hyperlipidemia, unspecified: Secondary | ICD-10-CM

## 2011-09-11 DIAGNOSIS — I2510 Atherosclerotic heart disease of native coronary artery without angina pectoris: Secondary | ICD-10-CM

## 2011-09-11 DIAGNOSIS — I10 Essential (primary) hypertension: Secondary | ICD-10-CM | POA: Diagnosis not present

## 2011-09-11 NOTE — Progress Notes (Signed)
Subjective:    Patient ID: Paula Ferguson, female    DOB: 03-Feb-1941, 71 y.o.   MRN: [id number]  HPI  71 year old patient who is seen today for preoperative clearance. She is considering left shoulder surgery although seems a bit ambivalent. She has pain and decreased range of motion. Apparently there is a cyst present but orthopedics feels may be a factor with her pain. She has had multiple right shoulder surgeries as well as multiple other orthopedic surgeries. She has hypertension coronary artery disease and is status post aortic valve repair. She is on chronic Coumadin anticoagulation.  Except for orthopedic limitations she feels that she does quite well. No dyspnea on exertion or exertional chest pain. No peripheral edema. She was seen by cardiology in January and was quite stable. She is scheduled for follow up 2-D echocardiogram in one year  Past Medical History  Diagnosis Date  . ANEMIA 09/15/2009  . HYPERLIPIDEMIA 02/07/2007  . HYPERTENSION 09/14/2006  . HYPOTHYROIDISM 09/14/2006  . LIVER FUNCTION TESTS, ABNORMAL, HX OF 01/12/2009  . OLECRANON BURSITIS, LEFT 08/26/2007  . OSTEOARTHRITIS 02/07/2007  . Blood transfusion   . CORONARY ARTERY DISEASE 08/26/2007    LOV Dr Katrinka Blazing 1/13 with EKG and clearance on chart    History   Social History  . Marital Status: Widowed    Spouse Name: N/A    Number of Children: N/A  . Years of Education: N/A   Occupational History  . Not on file.   Social History Main Topics  . Smoking status: Never Smoker   . Smokeless tobacco: Never Used  . Alcohol Use: No  . Drug Use: No  . Sexually Active: Not on file   Other Topics Concern  . Not on file   Social History Narrative  . No narrative on file    Past Surgical History  Procedure Date  . Cholecystectomy   . Abdominal hysterectomy   . Knee surgery   . Ankle surgery   . Wrist surgery   . Shoulder surgery   . Coronary artery bypass graft     with  aortic valve replacement 2003  .  Cardiac valve replacement   . Cardiac catheterization     2003  . Joint replacement     right shoulder arthroscopy, left partial knee replacement  . Tonsillectomy   . Colonoscopy   . Total knee arthroplasty 03/21/2011    Procedure: TOTAL KNEE ARTHROPLASTY;  Surgeon: Shelda Pal, MD;  Location: [facility];  Service: Orthopedics;  Laterality: Right;    No family history on file.  Allergies  Allergen Reactions  . Hydromorphone Hcl Itching  . Meperidine Hcl Itching  . Morphine Sulfate Itching    Current Outpatient Prescriptions on File Prior to Visit  Medication Sig Dispense Refill  . buPROPion (WELLBUTRIN SR) 150 MG 12 hr tablet Take 1 tablet (150 mg total) by mouth daily.  180 tablet  6  . Calcium Citrate-Vitamin D (CITRACAL PETITES/VITAMIN D PO) Take 2 tablets by mouth daily.      . cholecalciferol (VITAMIN D) 1000 UNITS tablet Take 1,000 Units by mouth daily.      . ferrous sulfate 325 (65 FE) MG tablet Take 1 tablet (325 mg total) by mouth 3 (three) times daily after meals.      Marland Kitchen HYDROcodone-acetaminophen (VICODIN) 5-500 MG per tablet Take 1 tablet by mouth every 6 (six) hours as needed.  90 tablet  3  . levothyroxine (SYNTHROID, LEVOTHROID) 100 MCG tablet Take 1 tablet (100  mcg total) by mouth daily.  90 tablet  6  . losartan (COZAAR) 100 MG tablet Take 1 tablet (100 mg total) by mouth daily.  90 tablet  6  . Multiple Vitamins-Minerals (CENTRUM PO) Take 1 tablet by mouth daily.      . traZODone (DESYREL) 100 MG tablet Take 1 tablet (100 mg total) by mouth at bedtime.  90 tablet  6  . verapamil (CALAN-SR) 240 MG CR tablet Take 1 tablet (240 mg total) by mouth every morning.  90 tablet  6  . warfarin (COUMADIN) 5 MG tablet Take 1-1.5 tablets (5-7.5 mg total) by mouth See admin instructions. 7.5 MG ON Thursday AND 5 MG ALL OTHER DAYS        BP 120/70  Pulse 76  Temp 98.3 F (36.8 C) (Oral)  Resp 18  Ht 5' 1.5" (1.562 m)  Wt 127 lb (57.607 kg)  BMI 23.61 kg/m2  SpO2  98%       Review of Systems  Constitutional: Negative.   HENT: Negative for hearing loss, congestion, sore throat, rhinorrhea, dental problem, sinus pressure and tinnitus.   Eyes: Negative for pain, discharge and visual disturbance.  Respiratory: Negative for cough and shortness of breath.   Cardiovascular: Negative for chest pain, palpitations and leg swelling.  Gastrointestinal: Negative for nausea, vomiting, abdominal pain, diarrhea, constipation, blood in stool and abdominal distention.  Genitourinary: Negative for dysuria, urgency, frequency, hematuria, flank pain, vaginal bleeding, vaginal discharge, difficulty urinating, vaginal pain and pelvic pain.  Musculoskeletal: Positive for back pain and arthralgias. Negative for joint swelling and gait problem.  Skin: Negative for rash.  Neurological: Negative for dizziness, syncope, speech difficulty, weakness, numbness and headaches.  Hematological: Negative for adenopathy.  Psychiatric/Behavioral: Negative for behavioral problems, dysphoric mood and agitation. The patient is not nervous/anxious.        Objective:   Physical Exam  Constitutional: She is oriented to person, place, and time. She appears well-developed and well-nourished.       Blood pressure well controlled  HENT:  Head: Normocephalic.  Right Ear: External ear normal.  Left Ear: External ear normal.  Mouth/Throat: Oropharynx is clear and moist.  Eyes: Conjunctivae and EOM are normal. Pupils are equal, round, and reactive to light.  Neck: Normal range of motion. Neck supple. No thyromegaly present.  Cardiovascular: Normal rate, regular rhythm and intact distal pulses.        Prosthetic heart sounds. Rhythm regular  Pulmonary/Chest: Effort normal and breath sounds normal.  Abdominal: Soft. Bowel sounds are normal. She exhibits no mass. There is no tenderness.  Musculoskeletal: Normal range of motion. She exhibits no edema (pigmentation changes left lower leg).   Lymphadenopathy:    She has no cervical adenopathy.  Neurological: She is alert and oriented to person, place, and time.  Skin: Skin is warm and dry. No rash noted.  Psychiatric: She has a normal mood and affect. Her behavior is normal.          Assessment & Plan:    status post aortic valve repair. Chronic Coumadin anticoagulation Coronary artery disease. Clinically stable Hypertension well controlled  Chronic left shoulder pain  The patient is medically cleared for surgery. She is aware that she will need Lovenox bridging therapy in the perioperative period.  She seems ambivalent about surgery and may wish to postpone to a later date unless orthopedics feels prompt surgery is indicated it to an enlarging cyst.

## 2011-09-11 NOTE — Patient Instructions (Signed)
Limit your sodium (Salt) intake  Return in 6 months for follow-up  Cardiology followup as discussed

## 2011-09-15 DIAGNOSIS — Z954 Presence of other heart-valve replacement: Secondary | ICD-10-CM | POA: Diagnosis not present

## 2011-09-15 DIAGNOSIS — Z7901 Long term (current) use of anticoagulants: Secondary | ICD-10-CM | POA: Diagnosis not present

## 2011-09-26 DIAGNOSIS — Z7901 Long term (current) use of anticoagulants: Secondary | ICD-10-CM | POA: Diagnosis not present

## 2011-09-26 DIAGNOSIS — Z954 Presence of other heart-valve replacement: Secondary | ICD-10-CM | POA: Diagnosis not present

## 2011-10-04 ENCOUNTER — Telehealth: Payer: Self-pay | Admitting: Internal Medicine

## 2011-10-04 MED ORDER — WARFARIN SODIUM 5 MG PO TABS: 5.0000 mg | ORAL_TABLET | ORAL | Status: DC

## 2011-10-04 NOTE — Telephone Encounter (Signed)
Coumadin should come from Cardiologist in the future I will give 15 pills only. We cannot be responsible for the med if we dont manage INR.

## 2011-10-04 NOTE — Telephone Encounter (Signed)
Needs an anticoag office visit ASAP!!

## 2011-10-04 NOTE — Telephone Encounter (Signed)
Pt req refill of Warfarin ASAP! Pt only has 4 pills left. Pt gets inr checked with Dr Sherilyn Cooter Smith-Cardiologist at [place] [phone].      Pls call in to [facility] [phone].

## 2011-10-05 DIAGNOSIS — M19049 Primary osteoarthritis, unspecified hand: Secondary | ICD-10-CM | POA: Diagnosis not present

## 2011-10-05 DIAGNOSIS — Z7901 Long term (current) use of anticoagulants: Secondary | ICD-10-CM | POA: Diagnosis not present

## 2011-10-05 DIAGNOSIS — Z954 Presence of other heart-valve replacement: Secondary | ICD-10-CM | POA: Diagnosis not present

## 2011-10-19 ENCOUNTER — Encounter: Payer: Self-pay | Admitting: Internal Medicine

## 2011-10-19 DIAGNOSIS — M1710 Unilateral primary osteoarthritis, unspecified knee: Secondary | ICD-10-CM | POA: Diagnosis not present

## 2011-10-19 DIAGNOSIS — Z1231 Encounter for screening mammogram for malignant neoplasm of breast: Secondary | ICD-10-CM | POA: Diagnosis not present

## 2011-10-30 DIAGNOSIS — Z954 Presence of other heart-valve replacement: Secondary | ICD-10-CM | POA: Diagnosis not present

## 2011-10-30 DIAGNOSIS — Z7901 Long term (current) use of anticoagulants: Secondary | ICD-10-CM | POA: Diagnosis not present

## 2011-11-08 ENCOUNTER — Encounter (HOSPITAL_COMMUNITY): Payer: Self-pay | Admitting: Pharmacy Technician

## 2011-11-10 ENCOUNTER — Encounter (HOSPITAL_COMMUNITY)
Admission: RE | Admit: 2011-11-10 | Discharge: 2011-11-10 | Disposition: A | Discharge status: Home / Self Care | Payer: Medicare Other | Source: Ambulatory Visit | Attending: Orthopedic Surgery | Admitting: Orthopedic Surgery

## 2011-11-10 ENCOUNTER — Encounter (HOSPITAL_COMMUNITY): Payer: Self-pay

## 2011-11-10 HISTORY — DX: Cardiac murmur, unspecified: R01.1

## 2011-11-10 HISTORY — DX: Pneumonia, unspecified organism: J18.9

## 2011-11-10 LAB — CBC
HCT: 38.4 % (ref 36.0–46.0)
Hemoglobin: 12.8 g/dL (ref 12.0–15.0)
MCV: 94.6 fL (ref 78.0–100.0)
Platelets: 170 10*3/uL (ref 150–400)
RBC: 4.06 MIL/uL (ref 3.87–5.11)
WBC: 4.4 10*3/uL (ref 4.0–10.5)

## 2011-11-10 LAB — BASIC METABOLIC PANEL
CO2: 28 mEq/L (ref 19–32)
Calcium: 9.8 mg/dL (ref 8.4–10.5)
Chloride: 104 mEq/L (ref 96–112)
Potassium: 4 mEq/L (ref 3.5–5.1)
Sodium: 140 mEq/L (ref 135–145)

## 2011-11-10 LAB — SURGICAL PCR SCREEN: Staphylococcus aureus: NEGATIVE

## 2011-11-10 MED ORDER — CHLORHEXIDINE GLUCONATE 4 % EX LIQD: 60.0000 mL | Freq: Once | CUTANEOUS | Status: DC

## 2011-11-10 NOTE — Progress Notes (Signed)
cxr 1/13 , note from dr Frederica Kuster 7/13 echo 7/09

## 2011-11-10 NOTE — Pre-Procedure Instructions (Addendum)
20 Paula Ferguson  11/10/2011   Your procedure is scheduled on:  11/17/11  Report to [facility] at 530 AM.  Call this number if you have problems the morning of surgery: [phone]   Remember:   Do not eat food or drink:After Midnight.    Take these medicines the morning of surgery with A SIP OF WATER: wellbutrin, levothyroxine, verapamil STOP multi vit, omega 3, vit b, coumadin as directed by dr   Drucilla Schmidt not wear jewelry, make-up or nail polish.  Do not wear lotions, powders, or perfumes.  Do not shave 48 hours prior to surgery. Men may shave face and neck.  Do not bring valuables to the hospital.  Contacts, dentures or bridgework may not be worn into surgery.  Leave suitcase in the car. After surgery it may be brought to your room.  For patients admitted to the hospital, checkout time is 11:00 AM the day of discharge.   Patients discharged the day of surgery will not be allowed to drive home.  Name and phone number of your driver: amy dtr  Special Instructions: read instructions given to you at preadmit regarding showers   Please read over the following fact sheets that you were given: Pain Booklet, Coughing and Deep Breathing, Blood Transfusion Information, MRSA Information and Surgical Site Infection Prevention

## 2011-11-13 NOTE — Consult Note (Signed)
Anesthesia Chart Review:  Patient is a 71 year old female scheduled for left total shoulder versus reverse total shoulder arthroplasty, right thumb cortisone injection on 11/17/11 by Dr. [name].  History includes CAD/AS s/p CABG X 1 (LIMA to LAD) and AVR (mechanical [employer]) 09/18/01, non-smoker, HTN, OA, HLD, hypothyroidism, left TKA 02/2011.  PCP is Dr. Davy Pique.  He has cleared her for this procedure, but recommended peri-operative Lovenox.    Cardiologist is Dr. Katrinka Blazing [facility]).  He cleared her with moderate increased risk (1-5%) for complication including bleeding, clotting, and arrhythmia.  EKG from 02/23/11 [facility]) showed SR, LAFB.  Echo on 10/14/07 showed normally functioning mechanical aortic valve prosthesis, trivial perivalvular aortic regurgitation, mild mitral and tricuspid valvular regurgitation, normal left ventricular size and function, normal aortic root size.  EF 64.7%.  Her last cardiac cath was on 07/29/01 prior to her CABG/AVR and showed AS, normal CX and RCA, mild-moderate LAD/DIAG disease, normal LV funciton.  See report under Notes tab.  CXR on 03/14/11 showed no active cardiopulmonary abnormalities.   Labs noted.  She is scheduled to be on Coumadin until 11/12/11.  Repeat PT/PTT on arrival.  Shonna Chock, PA-C

## 2011-11-14 NOTE — H&P (Signed)
CC: left shoulder pain HPI: 71 y/o female with worsening left shoulder pain due to end stage osteoarthritis. Pt elected to have a total vs reverse arthroplasty to decrease pain and increase function PMH: hyperlipidemia, hypertension, coronary heart disease Social: non smoker, occasional etoh, retired  Allergies: morphine, dilaudid, demerol Meds: see above Family: hypertension, COPD, cancer ROS: pain and weakness with the left upper extremity, otherwise ros negative PE: alert and appropriate 71 y/o female in no acute distress Cranial nerves 2-12 intact Breath sounds equal bilaterally with no wheeze rhonchi or rales  Heart: regular rate with 2/6 systolic murmur Abd: non tender non distended Left upper extremity: mild pain and guarding with rom nv intact distally No rashes or edema bilateral upper or lower extremities X-rays: end stage osteoarthritis left shoulder Assessment: end stage osteoarthritis left shoulder Plan: plan for total shoulder vs reverse total shoulder arthroplasty to decrease pain and increase function

## 2011-11-15 DIAGNOSIS — M25519 Pain in unspecified shoulder: Secondary | ICD-10-CM | POA: Diagnosis not present

## 2011-11-16 MED ORDER — CEFAZOLIN SODIUM-DEXTROSE 2-3 GM-% IV SOLR
2.0000 g | INTRAVENOUS | Status: AC
  Administered 2011-11-17: 2 g via INTRAVENOUS
  Filled 2011-11-16: qty 50

## 2011-11-17 ENCOUNTER — Encounter (HOSPITAL_COMMUNITY)
Admission: RE | Disposition: A | Discharge status: Home Health | Payer: Self-pay | Source: Ambulatory Visit | Attending: Orthopedic Surgery

## 2011-11-17 ENCOUNTER — Encounter (HOSPITAL_COMMUNITY): Payer: Self-pay | Admitting: General Practice

## 2011-11-17 ENCOUNTER — Encounter (HOSPITAL_COMMUNITY): Payer: Self-pay | Admitting: *Deleted

## 2011-11-17 ENCOUNTER — Inpatient Hospital Stay (HOSPITAL_COMMUNITY)
Admission: RE | Admit: 2011-11-17 | Discharge: 2011-11-20 | DRG: 484 | Disposition: A | Discharge status: Home Health | Payer: Medicare Other | Source: Ambulatory Visit | Attending: Orthopedic Surgery | Admitting: Orthopedic Surgery

## 2011-11-17 ENCOUNTER — Encounter (HOSPITAL_COMMUNITY): Payer: Self-pay | Admitting: Vascular Surgery

## 2011-11-17 ENCOUNTER — Ambulatory Visit (HOSPITAL_COMMUNITY): Payer: Medicare Other | Admitting: Vascular Surgery

## 2011-11-17 ENCOUNTER — Inpatient Hospital Stay (HOSPITAL_COMMUNITY): Payer: Medicare Other

## 2011-11-17 DIAGNOSIS — I10 Essential (primary) hypertension: Secondary | ICD-10-CM | POA: Diagnosis present

## 2011-11-17 DIAGNOSIS — I2510 Atherosclerotic heart disease of native coronary artery without angina pectoris: Secondary | ICD-10-CM | POA: Diagnosis present

## 2011-11-17 DIAGNOSIS — F329 Major depressive disorder, single episode, unspecified: Secondary | ICD-10-CM | POA: Diagnosis present

## 2011-11-17 DIAGNOSIS — M19049 Primary osteoarthritis, unspecified hand: Secondary | ICD-10-CM | POA: Diagnosis not present

## 2011-11-17 DIAGNOSIS — M19019 Primary osteoarthritis, unspecified shoulder: Secondary | ICD-10-CM | POA: Diagnosis not present

## 2011-11-17 DIAGNOSIS — Z471 Aftercare following joint replacement surgery: Secondary | ICD-10-CM | POA: Diagnosis not present

## 2011-11-17 DIAGNOSIS — M25519 Pain in unspecified shoulder: Secondary | ICD-10-CM | POA: Diagnosis not present

## 2011-11-17 DIAGNOSIS — Z01811 Encounter for preprocedural respiratory examination: Secondary | ICD-10-CM

## 2011-11-17 DIAGNOSIS — E039 Hypothyroidism, unspecified: Secondary | ICD-10-CM | POA: Diagnosis present

## 2011-11-17 DIAGNOSIS — Z23 Encounter for immunization: Secondary | ICD-10-CM | POA: Diagnosis not present

## 2011-11-17 DIAGNOSIS — G8918 Other acute postprocedural pain: Secondary | ICD-10-CM | POA: Diagnosis not present

## 2011-11-17 DIAGNOSIS — Z96619 Presence of unspecified artificial shoulder joint: Secondary | ICD-10-CM | POA: Diagnosis not present

## 2011-11-17 DIAGNOSIS — Z01812 Encounter for preprocedural laboratory examination: Secondary | ICD-10-CM

## 2011-11-17 DIAGNOSIS — Z7982 Long term (current) use of aspirin: Secondary | ICD-10-CM

## 2011-11-17 DIAGNOSIS — E785 Hyperlipidemia, unspecified: Secondary | ICD-10-CM | POA: Diagnosis not present

## 2011-11-17 DIAGNOSIS — M719 Bursopathy, unspecified: Secondary | ICD-10-CM | POA: Diagnosis present

## 2011-11-17 DIAGNOSIS — M129 Arthropathy, unspecified: Secondary | ICD-10-CM | POA: Diagnosis not present

## 2011-11-17 DIAGNOSIS — M67919 Unspecified disorder of synovium and tendon, unspecified shoulder: Secondary | ICD-10-CM | POA: Diagnosis present

## 2011-11-17 DIAGNOSIS — F3289 Other specified depressive episodes: Secondary | ICD-10-CM | POA: Diagnosis present

## 2011-11-17 HISTORY — PX: STERIOD INJECTION: SHX5046

## 2011-11-17 HISTORY — PX: REVERSE SHOULDER ARTHROPLASTY: SHX5054

## 2011-11-17 HISTORY — DX: Major depressive disorder, single episode, unspecified: F32.9

## 2011-11-17 HISTORY — DX: Post-traumatic stress disorder, unspecified: F43.10

## 2011-11-17 HISTORY — DX: Depression, unspecified: F32.A

## 2011-11-17 LAB — POCT I-STAT 4, (NA,K, GLUC, HGB,HCT)
Glucose, Bld: 96 mg/dL (ref 70–99)
HCT: 34 % — ABNORMAL LOW (ref 36.0–46.0)
Hemoglobin: 11.6 g/dL — ABNORMAL LOW (ref 12.0–15.0)
Potassium: 3.8 mEq/L (ref 3.5–5.1)
Sodium: 143 mEq/L (ref 135–145)

## 2011-11-17 LAB — APTT: aPTT: 43 seconds — ABNORMAL HIGH (ref 24–37)

## 2011-11-17 SURGERY — STEROID INJECTION
Anesthesia: General | Site: Thumb | Laterality: Right | Wound class: Clean

## 2011-11-17 MED ORDER — METHOCARBAMOL 500 MG PO TABS
500.0000 mg | ORAL_TABLET | Freq: Four times a day (QID) | ORAL | Status: DC | PRN
  Administered 2011-11-17 – 2011-11-19 (×6): 500 mg via ORAL
  Filled 2011-11-17 (×8): qty 1

## 2011-11-17 MED ORDER — MIDAZOLAM HCL 5 MG/5ML IJ SOLN
INTRAMUSCULAR | Status: DC | PRN
  Administered 2011-11-17: 2 mg via INTRAVENOUS

## 2011-11-17 MED ORDER — VERAPAMIL HCL ER 240 MG PO TBCR
240.0000 mg | EXTENDED_RELEASE_TABLET | Freq: Every day | ORAL | Status: DC
  Administered 2011-11-17 – 2011-11-19 (×3): 240 mg via ORAL
  Filled 2011-11-17 (×4): qty 1

## 2011-11-17 MED ORDER — FENTANYL CITRATE 0.05 MG/ML IJ SOLN
INTRAMUSCULAR | Status: DC | PRN
  Administered 2011-11-17 (×3): 50 ug via INTRAVENOUS

## 2011-11-17 MED ORDER — LOSARTAN POTASSIUM 50 MG PO TABS: 100.0000 mg | ORAL_TABLET | Freq: Every day | ORAL | Status: DC

## 2011-11-17 MED ORDER — PHENOL 1.4 % MT LIQD: 1.0000 | OROMUCOSAL | Status: DC | PRN

## 2011-11-17 MED ORDER — TRIAMCINOLONE ACETONIDE 40 MG/ML IJ SUSP
INTRAMUSCULAR | Status: DC | PRN
  Administered 2011-11-17: 1 mL via INTRAMUSCULAR

## 2011-11-17 MED ORDER — VITAMIN D3 25 MCG (1000 UNIT) PO TABS
1000.0000 [IU] | ORAL_TABLET | Freq: Every day | ORAL | Status: DC
  Administered 2011-11-17 – 2011-11-20 (×4): 1000 [IU] via ORAL
  Filled 2011-11-17 (×4): qty 1

## 2011-11-17 MED ORDER — SODIUM CHLORIDE 0.9 % IV SOLN
10.0000 mg | INTRAVENOUS | Status: DC | PRN
  Administered 2011-11-17: 5 ug/min via INTRAVENOUS

## 2011-11-17 MED ORDER — LEVOTHYROXINE SODIUM 100 MCG PO TABS
100.0000 ug | ORAL_TABLET | Freq: Every day | ORAL | Status: DC
  Administered 2011-11-18 – 2011-11-20 (×3): 100 ug via ORAL
  Filled 2011-11-17 (×3): qty 1

## 2011-11-17 MED ORDER — LIDOCAINE HCL 1 % IJ SOLN
INTRAMUSCULAR | Status: DC | PRN
  Administered 2011-11-17: 1 mL

## 2011-11-17 MED ORDER — OXYCODONE HCL 5 MG/5ML PO SOLN: 5.0000 mg | Freq: Once | ORAL | Status: AC | PRN

## 2011-11-17 MED ORDER — WARFARIN - PHARMACIST DOSING INPATIENT
Freq: Every day | Status: DC
  Administered 2011-11-17: 18:00:00

## 2011-11-17 MED ORDER — HYDROCODONE-ACETAMINOPHEN 7.5-325 MG PO TABS
1.0000 | ORAL_TABLET | ORAL | Status: DC | PRN
  Administered 2011-11-17 – 2011-11-18 (×5): 2 via ORAL
  Filled 2011-11-17 (×5): qty 2

## 2011-11-17 MED ORDER — ONDANSETRON HCL 4 MG/2ML IJ SOLN: 4.0000 mg | Freq: Four times a day (QID) | INTRAMUSCULAR | Status: DC | PRN

## 2011-11-17 MED ORDER — TRAZODONE HCL 100 MG PO TABS
100.0000 mg | ORAL_TABLET | Freq: Every day | ORAL | Status: DC
  Administered 2011-11-17 – 2011-11-19 (×3): 100 mg via ORAL
  Filled 2011-11-17 (×4): qty 1

## 2011-11-17 MED ORDER — LIDOCAINE HCL (CARDIAC) 20 MG/ML IV SOLN
INTRAVENOUS | Status: DC | PRN
  Administered 2011-11-17: 40 mg via INTRAVENOUS

## 2011-11-17 MED ORDER — ROCURONIUM BROMIDE 100 MG/10ML IV SOLN
INTRAVENOUS | Status: DC | PRN
  Administered 2011-11-17: 50 mg via INTRAVENOUS

## 2011-11-17 MED ORDER — VITAMIN B-12 1000 MCG PO TABS
1000.0000 ug | ORAL_TABLET | Freq: Every day | ORAL | Status: DC
  Administered 2011-11-17 – 2011-11-20 (×4): 1000 ug via ORAL
  Filled 2011-11-17 (×4): qty 1

## 2011-11-17 MED ORDER — ENOXAPARIN SODIUM 30 MG/0.3ML ~~LOC~~ SOLN
30.0000 mg | Freq: Two times a day (BID) | SUBCUTANEOUS | Status: DC
  Administered 2011-11-18: 30 mg via SUBCUTANEOUS
  Filled 2011-11-17 (×3): qty 0.3

## 2011-11-17 MED ORDER — LOSARTAN POTASSIUM 50 MG PO TABS
100.0000 mg | ORAL_TABLET | Freq: Every day | ORAL | Status: DC
  Administered 2011-11-17 – 2011-11-20 (×4): 100 mg via ORAL
  Filled 2011-11-17 (×4): qty 2

## 2011-11-17 MED ORDER — ONDANSETRON HCL 4 MG PO TABS: 4.0000 mg | ORAL_TABLET | Freq: Four times a day (QID) | ORAL | Status: DC | PRN

## 2011-11-17 MED ORDER — METHOCARBAMOL 100 MG/ML IJ SOLN
500.0000 mg | Freq: Four times a day (QID) | INTRAMUSCULAR | Status: DC | PRN
  Administered 2011-11-17: 500 mg via INTRAVENOUS
  Filled 2011-11-17: qty 5

## 2011-11-17 MED ORDER — BUPIVACAINE-EPINEPHRINE 0.25% -1:200000 IJ SOLN
INTRAMUSCULAR | Status: DC | PRN
  Administered 2011-11-17: 7 mL

## 2011-11-17 MED ORDER — PHENYLEPHRINE HCL 10 MG/ML IJ SOLN
INTRAMUSCULAR | Status: DC | PRN
  Administered 2011-11-17 (×5): 80 ug via INTRAVENOUS

## 2011-11-17 MED ORDER — METOCLOPRAMIDE HCL 10 MG PO TABS: 5.0000 mg | ORAL_TABLET | Freq: Three times a day (TID) | ORAL | Status: DC | PRN

## 2011-11-17 MED ORDER — TRIAMCINOLONE ACETONIDE 40 MG/ML IJ SUSP
INTRAMUSCULAR | Status: AC
  Filled 2011-11-17: qty 1

## 2011-11-17 MED ORDER — HYDROCODONE-ACETAMINOPHEN 7.5-325 MG PO TABS: 1.0000 | ORAL_TABLET | Freq: Four times a day (QID) | ORAL | Status: DC | PRN

## 2011-11-17 MED ORDER — THROMBIN 5000 UNITS EX SOLR
CUTANEOUS | Status: DC | PRN
  Administered 2011-11-17: 5000 [IU] via TOPICAL

## 2011-11-17 MED ORDER — HYDROMORPHONE HCL PF 1 MG/ML IJ SOLN: 0.2500 mg | INTRAMUSCULAR | Status: DC | PRN

## 2011-11-17 MED ORDER — BISACODYL 10 MG RE SUPP: 10.0000 mg | Freq: Every day | RECTAL | Status: DC | PRN

## 2011-11-17 MED ORDER — LACTATED RINGERS IV SOLN
INTRAVENOUS | Status: DC | PRN
  Administered 2011-11-17 (×2): via INTRAVENOUS

## 2011-11-17 MED ORDER — LIDOCAINE HCL (PF) 1 % IJ SOLN
INTRAMUSCULAR | Status: AC
  Filled 2011-11-17: qty 30

## 2011-11-17 MED ORDER — ONDANSETRON HCL 4 MG/2ML IJ SOLN: 4.0000 mg | Freq: Once | INTRAMUSCULAR | Status: DC | PRN

## 2011-11-17 MED ORDER — FERROUS SULFATE 325 (65 FE) MG PO TABS
325.0000 mg | ORAL_TABLET | Freq: Two times a day (BID) | ORAL | Status: DC
  Administered 2011-11-17 – 2011-11-20 (×6): 325 mg via ORAL
  Filled 2011-11-17 (×7): qty 1

## 2011-11-17 MED ORDER — ACETAMINOPHEN 650 MG RE SUPP: 650.0000 mg | Freq: Four times a day (QID) | RECTAL | Status: DC | PRN

## 2011-11-17 MED ORDER — OMEGA-3-ACID ETHYL ESTERS 1 G PO CAPS
1.0000 g | ORAL_CAPSULE | Freq: Every day | ORAL | Status: DC
  Administered 2011-11-17 – 2011-11-20 (×4): 1 g via ORAL
  Filled 2011-11-17 (×4): qty 1

## 2011-11-17 MED ORDER — NEOSTIGMINE METHYLSULFATE 1 MG/ML IJ SOLN
INTRAMUSCULAR | Status: DC | PRN
  Administered 2011-11-17: 2.5 mg via INTRAVENOUS

## 2011-11-17 MED ORDER — BUPROPION HCL ER (SR) 150 MG PO TB12
150.0000 mg | ORAL_TABLET | Freq: Every day | ORAL | Status: DC
  Administered 2011-11-18 – 2011-11-20 (×3): 150 mg via ORAL
  Filled 2011-11-17 (×3): qty 1

## 2011-11-17 MED ORDER — SODIUM CHLORIDE 0.9 % IV SOLN
INTRAVENOUS | Status: DC
  Administered 2011-11-17: 23:00:00 via INTRAVENOUS

## 2011-11-17 MED ORDER — ACETAMINOPHEN 325 MG PO TABS: 650.0000 mg | ORAL_TABLET | Freq: Four times a day (QID) | ORAL | Status: DC | PRN

## 2011-11-17 MED ORDER — CEFAZOLIN SODIUM 1-5 GM-% IV SOLN
1.0000 g | Freq: Four times a day (QID) | INTRAVENOUS | Status: AC
  Administered 2011-11-17 – 2011-11-18 (×3): 1 g via INTRAVENOUS
  Filled 2011-11-17 (×3): qty 50

## 2011-11-17 MED ORDER — SODIUM CHLORIDE 0.9 % IR SOLN
Status: DC | PRN
  Administered 2011-11-17: 1000 mL

## 2011-11-17 MED ORDER — PROPOFOL 10 MG/ML IV BOLUS
INTRAVENOUS | Status: DC | PRN
  Administered 2011-11-17: 100 mg via INTRAVENOUS

## 2011-11-17 MED ORDER — THROMBIN 5000 UNITS EX SOLR
CUTANEOUS | Status: AC
  Filled 2011-11-17: qty 5000

## 2011-11-17 MED ORDER — HEMOSTATIC AGENTS (NO CHARGE) OPTIME
TOPICAL | Status: DC | PRN
  Administered 2011-11-17: 1 via TOPICAL

## 2011-11-17 MED ORDER — INFLUENZA VIRUS VACC SPLIT PF IM SUSP
0.5000 mL | INTRAMUSCULAR | Status: AC
  Administered 2011-11-18: 0.5 mL via INTRAMUSCULAR
  Filled 2011-11-17: qty 0.5

## 2011-11-17 MED ORDER — METHOCARBAMOL 500 MG PO TABS: 500.0000 mg | ORAL_TABLET | Freq: Three times a day (TID) | ORAL | Status: DC | PRN

## 2011-11-17 MED ORDER — WARFARIN SODIUM 6 MG PO TABS
6.0000 mg | ORAL_TABLET | Freq: Once | ORAL | Status: AC
  Administered 2011-11-17: 6 mg via ORAL
  Filled 2011-11-17 (×2): qty 1

## 2011-11-17 MED ORDER — ONDANSETRON HCL 4 MG/2ML IJ SOLN
INTRAMUSCULAR | Status: DC | PRN
  Administered 2011-11-17: 4 mg via INTRAVENOUS

## 2011-11-17 MED ORDER — OXYCODONE HCL 5 MG PO TABS
ORAL_TABLET | ORAL | Status: AC
  Filled 2011-11-17: qty 1

## 2011-11-17 MED ORDER — METOCLOPRAMIDE HCL 5 MG/ML IJ SOLN: 5.0000 mg | Freq: Three times a day (TID) | INTRAMUSCULAR | Status: DC | PRN

## 2011-11-17 MED ORDER — GLYCOPYRROLATE 0.2 MG/ML IJ SOLN
INTRAMUSCULAR | Status: DC | PRN
  Administered 2011-11-17: .5 mg via INTRAVENOUS

## 2011-11-17 MED ORDER — OXYCODONE HCL 5 MG PO TABS
5.0000 mg | ORAL_TABLET | Freq: Once | ORAL | Status: AC | PRN
  Administered 2011-11-17: 5 mg via ORAL

## 2011-11-17 MED ORDER — BUPIVACAINE-EPINEPHRINE PF 0.5-1:200000 % IJ SOLN
INTRAMUSCULAR | Status: DC | PRN
  Administered 2011-11-17: 25 mL

## 2011-11-17 MED ORDER — BUPIVACAINE-EPINEPHRINE PF 0.25-1:200000 % IJ SOLN
INTRAMUSCULAR | Status: AC
  Filled 2011-11-17: qty 30

## 2011-11-17 MED ORDER — SODIUM CHLORIDE 0.9 % IV SOLN
INTRAVENOUS | Status: DC | PRN
  Administered 2011-11-17: 10:00:00 via INTRAVENOUS

## 2011-11-17 MED ORDER — MENTHOL 3 MG MT LOZG: 1.0000 | LOZENGE | OROMUCOSAL | Status: DC | PRN

## 2011-11-17 SURGICAL SUPPLY — 70 items
BLADE SAG 18X100X1.27 (BLADE) ×3 IMPLANT
BLADE SAW SAG 73X25 THK (BLADE)
BLADE SAW SGTL 73X25 THK (BLADE) IMPLANT
BUR SURG 4X8 MED (BURR) IMPLANT
BURR SURG 4X8 MED (BURR)
CLOTH BEACON ORANGE TIMEOUT ST (SAFETY) ×3 IMPLANT
CLSR STERI-STRIP ANTIMIC 1/2X4 (GAUZE/BANDAGES/DRESSINGS) ×3 IMPLANT
COVER SURGICAL LIGHT HANDLE (MISCELLANEOUS) ×3 IMPLANT
DRAPE INCISE IOBAN 66X45 STRL (DRAPES) ×3 IMPLANT
DRAPE U-SHAPE 47X51 STRL (DRAPES) ×3 IMPLANT
DRAPE X-RAY CASS 24X20 (DRAPES) IMPLANT
DRILL BIT 5/64 (BIT) ×3 IMPLANT
DRSG ADAPTIC 3X8 NADH LF (GAUZE/BANDAGES/DRESSINGS) IMPLANT
DRSG PAD ABDOMINAL 8X10 ST (GAUZE/BANDAGES/DRESSINGS) IMPLANT
DURAPREP 26ML APPLICATOR (WOUND CARE) ×3 IMPLANT
ELECT BLADE 4.0 EZ CLEAN MEGAD (MISCELLANEOUS) ×3
ELECT NEEDLE TIP 2.8 STRL (NEEDLE) ×3 IMPLANT
ELECT REM PT RETURN 9FT ADLT (ELECTROSURGICAL) ×3
ELECTRODE BLDE 4.0 EZ CLN MEGD (MISCELLANEOUS) ×2 IMPLANT
ELECTRODE REM PT RTRN 9FT ADLT (ELECTROSURGICAL) ×2 IMPLANT
GLOVE BIOGEL PI ORTHO PRO 7.5 (GLOVE) ×1
GLOVE BIOGEL PI ORTHO PRO SZ8 (GLOVE) ×1
GLOVE ORTHO TXT STRL SZ7.5 (GLOVE) ×3 IMPLANT
GLOVE PI ORTHO PRO STRL 7.5 (GLOVE) ×2 IMPLANT
GLOVE PI ORTHO PRO STRL SZ8 (GLOVE) ×2 IMPLANT
GLOVE SURG ORTHO 8.0 STRL STRW (GLOVE) ×6 IMPLANT
GLOVE SURG SS PI 7.0 STRL IVOR (GLOVE) ×3 IMPLANT
GOWN STRL NON-REIN LRG LVL3 (GOWN DISPOSABLE) IMPLANT
GOWN STRL REIN XL XLG (GOWN DISPOSABLE) ×9 IMPLANT
HANDPIECE INTERPULSE COAX TIP (DISPOSABLE)
KIT BASIN OR (CUSTOM PROCEDURE TRAY) ×3 IMPLANT
KIT ROOM TURNOVER OR (KITS) IMPLANT
MANIFOLD NEPTUNE II (INSTRUMENTS) ×3 IMPLANT
NDL SUT 6 .5 CRC .975X.05 MAYO (NEEDLE) ×2 IMPLANT
NEEDLE 1/2 CIR MAYO (NEEDLE) IMPLANT
NEEDLE HYPO 25GX1X1/2 BEV (NEEDLE) ×9 IMPLANT
NEEDLE MAYO TAPER (NEEDLE) ×2
NS IRRIG 1000ML POUR BTL (IV SOLUTION) ×3 IMPLANT
PACK SHOULDER (CUSTOM PROCEDURE TRAY) ×3 IMPLANT
PAD ARMBOARD 7.5X6 YLW CONV (MISCELLANEOUS) ×6 IMPLANT
SET HNDPC FAN SPRY TIP SCT (DISPOSABLE) IMPLANT
SLING ARM IMMOBILIZER LRG (SOFTGOODS) IMPLANT
SLING ARM IMMOBILIZER MED (SOFTGOODS) ×3 IMPLANT
SMARTMIX MINI TOWER (MISCELLANEOUS)
SPONGE GAUZE 4X4 12PLY (GAUZE/BANDAGES/DRESSINGS) IMPLANT
SPONGE LAP 18X18 X RAY DECT (DISPOSABLE) ×3 IMPLANT
SPONGE LAP 4X18 X RAY DECT (DISPOSABLE) ×3 IMPLANT
SPONGE SURGIFOAM ABS GEL SZ50 (HEMOSTASIS) IMPLANT
STRIP CLOSURE SKIN 1/2X4 (GAUZE/BANDAGES/DRESSINGS) IMPLANT
SUCTION FRAZIER TIP 10 FR DISP (SUCTIONS) ×3 IMPLANT
SUT ETHILON 2 0 FS 18 (SUTURE) ×3 IMPLANT
SUT FIBERWIRE #2 38 T-5 BLUE (SUTURE) ×15
SUT MNCRL AB 4-0 PS2 18 (SUTURE) ×3 IMPLANT
SUT VIC AB 0 CT1 27 (SUTURE) ×2
SUT VIC AB 0 CT1 27XBRD ANBCTR (SUTURE) ×4 IMPLANT
SUT VIC AB 0 CT2 27 (SUTURE) ×6 IMPLANT
SUT VIC AB 2-0 CT1 27 (SUTURE) ×4
SUT VIC AB 2-0 CT1 TAPERPNT 27 (SUTURE) ×4 IMPLANT
SUT VICRYL AB 2 0 TIES (SUTURE) IMPLANT
SUTURE FIBERWR #2 38 T-5 BLUE (SUTURE) ×10 IMPLANT
SYR 3ML LL SCALE MARK (SYRINGE) ×3 IMPLANT
SYR CONTROL 10ML LL (SYRINGE) ×3 IMPLANT
TAPE CLOTH SURG 6X10 WHT LF (GAUZE/BANDAGES/DRESSINGS) ×3 IMPLANT
TOWEL OR 17X24 6PK STRL BLUE (TOWEL DISPOSABLE) ×3 IMPLANT
TOWEL OR 17X26 10 PK STRL BLUE (TOWEL DISPOSABLE) ×3 IMPLANT
TOWER SMARTMIX MINI (MISCELLANEOUS) IMPLANT
TRAY FOLEY CATH 14FR (SET/KITS/TRAYS/PACK) IMPLANT
TUBE CONNECTING 12X1/4 (SUCTIONS) ×3 IMPLANT
WATER STERILE IRR 1000ML POUR (IV SOLUTION) ×3 IMPLANT
YANKAUER SUCT BULB TIP NO VENT (SUCTIONS) ×6 IMPLANT

## 2011-11-17 NOTE — Progress Notes (Signed)
Utilization review completed.  

## 2011-11-17 NOTE — Interval H&P Note (Signed)
History and Physical Interval Note:  11/17/2011 7:39 AM  Paula Ferguson  has presented today for surgery, with the diagnosis of left shoulder oa and right thumb oa   The various methods of treatment have been discussed with the patient and family. After consideration of risks, benefits and other options for treatment, the patient has consented to  Procedure(s) (LRB) with comments: TOTAL SHOULDER ARTHROPLASTY (Left) - left shoulder total shoulder arthroplasty verse reverse total shoulder arthroplasty  STEROID INJECTION (Right) - cortizone injection right thumb as a surgical intervention .  The patient's history has been reviewed, patient examined, no change in status, stable for surgery.  I have reviewed the patient's chart and labs.  Questions were answered to the patient's satisfaction.     Huntleigh Doolen,STEVEN R

## 2011-11-17 NOTE — Plan of Care (Signed)
Problem: Consults Goal: Diagnosis- Total Joint Replacement Left Reverse total shoulder replacement.

## 2011-11-17 NOTE — Discharge Summary (Signed)
Physician Discharge Summary  Patient ID: Paula Ferguson MRN: [id number] DOB/AGE: Mar 08, 1940 71 y.o.  Admit date: 11/17/2011 Discharge date: 11/17/2011  Admission Diagnoses:  Shoulder arthritis, left  Discharge Diagnoses:  Same   Surgeries: Procedure(s): TOTAL SHOULDER ARTHROPLASTY STEROID INJECTION on 11/17/2011   Consultants: Occupational therapy  Discharged Condition: Stable  Hospital Course: [name] is an 71 y.o. female who was admitted 11/17/2011 with a chief complaint of shoulder pain,left, and found to have a diagnosis of shoulder arthritis.  They were brought to the operating room on 11/17/2011 and underwent the above named procedures.    The patient had an uncomplicated hospital course and was stable for discharge.  Recent vital signs:  Filed Vitals:   11/17/11 0614  BP: 157/81  Pulse: 86  Temp: 98.8 F (37.1 C)  Resp: 14    Recent laboratory studies:  Results for orders placed during the hospital encounter of 11/17/11  APTT      Component Value Range   aPTT 43 (*) 24 - 37 seconds  PROTIME-INR      Component Value Range   Prothrombin Time 13.8  11.6 - 15.2 seconds   INR 1.07  0.00 - 1.49    Discharge Medications:     Medication List     As of 11/17/2011  7:41 AM    ASK your doctor about these medications         BIOTIN PO   Take 1 tablet by mouth daily.      buPROPion 150 MG 12 hr tablet   Commonly known as: WELLBUTRIN SR   Take 150 mg by mouth daily.      CENTRUM PO   Take 1 tablet by mouth daily.      cholecalciferol 1000 UNITS tablet   Commonly known as: VITAMIN D   Take 1,000 Units by mouth daily.      CITRACAL PETITES/VITAMIN D PO   Take 2 tablets by mouth daily.      enoxaparin 60 MG/0.6ML injection   Commonly known as: LOVENOX   Inject 60 mg into the skin. To start 11/13/11  After'd/c coumadin      ferrous sulfate 325 (65 FE) MG tablet   Take 325 mg by mouth 2 (two) times daily.      FISH OIL PO   Take 1 tablet by  mouth daily.      levothyroxine 100 MCG tablet   Commonly known as: SYNTHROID, LEVOTHROID   Take 100 mcg by mouth daily.      losartan 100 MG tablet   Commonly known as: COZAAR   Take 100 mg by mouth daily.      traZODone 100 MG tablet   Commonly known as: DESYREL   Take 100 mg by mouth at bedtime.      verapamil 240 MG CR tablet   Commonly known as: CALAN-SR   Take 240 mg by mouth at bedtime.      VITAMIN B-12 PO   Take 1 tablet by mouth daily.      warfarin 5 MG tablet   Commonly known as: COUMADIN   Take 5 mg by mouth daily.        Diagnostic Studies: No results found.  Disposition: 06-Home-Health Care Svc      Discharge Orders    Future Appointments: Provider: Department: Dept Phone: Center:   03/18/2012 2:00 PM Gordy Savers, MD [facility] [phone] [facility]      Follow-up Information    Follow up with NORRIS,STEVEN  R, MD. Call in 2 weeks. [phone])    Contact information:   [facility] [address], [address] [place] [address] [phone]           Signed: Verlee Rossetti 11/17/2011, 7:41 AM

## 2011-11-17 NOTE — Anesthesia Procedure Notes (Addendum)
Anesthesia Regional Block:  Interscalene brachial plexus block  Pre-Anesthetic Checklist: ,, timeout performed, Correct Patient, Correct Site, Correct Laterality, Correct Procedure, Correct Position, site marked, Risks and benefits discussed,  Surgical consent,  Pre-op evaluation,  At surgeon's request and post-op pain management  Laterality: Left and Upper  Prep: chloraprep       Needles:  Injection technique: Single-shot  Needle Type: Echogenic Needle     Needle Length: 5cm 5 cm Needle Gauge: 21    Additional Needles:  Procedures: ultrasound guided Interscalene brachial plexus block Narrative:  Start time: 11/17/2011 7:05 AM End time: 11/17/2011 7:14 AM Injection made incrementally with aspirations every 5 mL.  Performed by: Personally  Anesthesiologist: Winferd Humphrey, MD  Interscalene brachial plexus block Procedure Name: Intubation Date/Time: 11/17/2011 7:53 AM Performed by: Quentin Ore Pre-anesthesia Checklist: Patient identified, Emergency Drugs available, Suction available, Patient being monitored and Timeout performed Patient Re-evaluated:Patient Re-evaluated prior to inductionOxygen Delivery Method: Circle system utilized Preoxygenation: Pre-oxygenation with 100% oxygen Intubation Type: IV induction Ventilation: Mask ventilation without difficulty Laryngoscope Size: Mac and 3 Grade View: Grade I Tube type: Oral Tube size: 7.0 mm Number of attempts: 1 Airway Equipment and Method: Stylet Placement Confirmation: ETT inserted through vocal cords under direct vision,  positive ETCO2 and breath sounds checked- equal and bilateral Secured at: 21 cm Tube secured with: Tape Dental Injury: Injury to lip

## 2011-11-17 NOTE — Transfer of Care (Signed)
Immediate Anesthesia Transfer of Care Note  Patient: Paula Ferguson  Procedure(s) Performed: Procedure(s) (LRB) with comments: STEROID INJECTION (Right) - cortizone injection right thumb REVERSE SHOULDER ARTHROPLASTY (Left) - left reverse shoulder arthroplasty  Patient Location: PACU  Anesthesia Type: General  Level of Consciousness: awake, alert  and oriented  Airway & Oxygen Therapy: Patient Spontanous Breathing and Patient connected to face mask oxygen  Post-op Assessment: Report given to PACU RN, Post -op Vital signs reviewed and stable and Patient moving all extremities X 4  Post vital signs: Reviewed and stable  Complications: No apparent anesthesia complications

## 2011-11-17 NOTE — Preoperative (Signed)
Beta Blockers   Reason not to administer Beta Blockers:Not Applicable 

## 2011-11-17 NOTE — Progress Notes (Signed)
ANTICOAGULATION CONSULT NOTE - Initial Consult  Pharmacy Consult for Coumadin Indication: Hx of AVR  Allergies  Allergen Reactions  . Hydromorphone Hcl Itching  . Meperidine Hcl Itching  . Morphine Sulfate Itching    Patient Measurements:     Vital Signs: Temp: 98.9 F (37.2 C) (09/27 1530) Temp src: Oral (09/27 0614) BP: 131/70 mmHg (09/27 1530) Pulse Rate: 85  (09/27 1430)  Labs:  Basename 11/17/11 1031 11/17/11 0604  HGB 11.6* --  HCT 34.0* --  PLT -- --  APTT -- 43*  LABPROT -- 13.8  INR -- 1.07  HEPARINUNFRC -- --  CREATININE -- --  CKTOTAL -- --  CKMB -- --  TROPONINI -- --    The CrCl is unknown because both a height and weight (above a minimum accepted value) are required for this calculation.   Medical History: Past Medical History  Diagnosis Date  . ANEMIA 09/15/2009  . HYPERLIPIDEMIA 02/07/2007  . HYPERTENSION 09/14/2006  . HYPOTHYROIDISM 09/14/2006  . LIVER FUNCTION TESTS, ABNORMAL, HX OF 01/12/2009  . OLECRANON BURSITIS, LEFT 08/26/2007  . OSTEOARTHRITIS 02/07/2007  . Blood transfusion   . CORONARY ARTERY DISEASE 08/26/2007    LOV Dr Katrinka Blazing 1/13 with EKG and clearance on chart  . Pneumonia     hx  . Heart murmur     hx- surgery    Medications:  Prescriptions prior to admission  Medication Sig Dispense Refill  . BIOTIN PO Take 1 tablet by mouth daily.      Marland Kitchen buPROPion (WELLBUTRIN SR) 150 MG 12 hr tablet Take 150 mg by mouth daily.      . Calcium Citrate-Vitamin D (CITRACAL PETITES/VITAMIN D PO) Take 2 tablets by mouth daily.      . cholecalciferol (VITAMIN D) 1000 UNITS tablet Take 1,000 Units by mouth daily.      . Cyanocobalamin (VITAMIN B-12 PO) Take 1 tablet by mouth daily.      Marland Kitchen enoxaparin (LOVENOX) 60 MG/0.6ML injection Inject 60 mg into the skin. To start 11/13/11  After'd/c coumadin      . ferrous sulfate 325 (65 FE) MG tablet Take 325 mg by mouth 2 (two) times daily.      Marland Kitchen levothyroxine (SYNTHROID, LEVOTHROID) 100 MCG tablet Take  100 mcg by mouth daily.      Marland Kitchen losartan (COZAAR) 100 MG tablet Take 100 mg by mouth daily.      . Multiple Vitamins-Minerals (CENTRUM PO) Take 1 tablet by mouth daily.      . Omega-3 Fatty Acids (FISH OIL PO) Take 1 tablet by mouth daily.      . traZODone (DESYREL) 100 MG tablet Take 100 mg by mouth at bedtime.      . verapamil (CALAN-SR) 240 MG CR tablet Take 240 mg by mouth at bedtime.      Marland Kitchen warfarin (COUMADIN) 5 MG tablet Take 5 mg by mouth daily.        Assessment: Ms. Dorval is a pleasant 71yo F who is s/p cortizone injection to R thumb & left reverse shoulder arthroplasty. Noted hx of AVR for which she is on chronic Coumadin PTA. Her last Coumadin dose of 5mg  was on 9/22 and she has been on Lovenox 60mg  SQ 12h since then.  Noted H/H decreased from baseline, but is WNL. Plts were okay at [facility] (170 on 9/20). Pt does not report any active bleeding. She endorses her home Coumadin regimen was 5mg  daily and that her INR goal has always been 2.5-3.5  Goal of Therapy:  INR 2.5-3.5 Monitor platelets by anticoagulation protocol: Yes   Plan:  - Will give Coumadin 6mg  today - Will check PT/INR daily.  - Will check CBC in AM. - Will plan to d/c Lovenox once INR >/= 1.8 per MD orders.  Thanks, Raneem Mendolia K. Allena Katz, PharmD, BCPS.  Clinical Pharmacist Pager [phone]. 11/17/2011 4:36 PM

## 2011-11-17 NOTE — Brief Op Note (Signed)
11/17/2011  10:39 AM  PATIENT:  Paula Ferguson  71 y.o. female  PRE-OPERATIVE DIAGNOSIS:  left shoulder oa and right thumb oa   POST-OPERATIVE DIAGNOSIS:  left shoulder oa and right thumb oa   PROCEDURE:  Procedure(s) (LRB) with comments: STEROID INJECTION (Right) - cortizone injection right thumb REVERSE SHOULDER ARTHROPLASTY (Left) - left reverse shoulder arthroplasty, DePuy Delta Reverse  SURGEON:  Surgeon(s) and Role:    * Verlee Rossetti, MD - Primary  PHYSICIAN ASSISTANT:   ASSISTANTS: Thea Gist, PA-C   ANESTHESIA:   general  EBL:  Total I/O In: 1532 [I.V.:1250; Blood:282] Out: 250 [Blood:250]  BLOOD ADMINISTERED:none and 400 CC PRBC  DRAINS: HV drain in subdeltoid space, sewn in   LOCAL MEDICATIONS USED:  MARCAINE     SPECIMEN:  No Specimen  DISPOSITION OF SPECIMEN:  N/A  COUNTS:  YES  TOURNIQUET:  * No tourniquets in log *  DICTATION: .Note written in paper chart and Other Dictation: Dictation Number [id number]  PLAN OF CARE: Admit to inpatient   PATIENT DISPOSITION:  PACU - hemodynamically stable.   Delay start of Pharmacological VTE agent (>24hrs) due to surgical blood loss or risk of bleeding: not applicable

## 2011-11-17 NOTE — Anesthesia Preprocedure Evaluation (Addendum)
Anesthesia Evaluation  Patient identified by MRN, date of birth, ID band Patient awake    Reviewed: Allergy & Precautions, H&P , NPO status , Patient's Chart, lab work & pertinent test results  Airway Mallampati: I TM Distance: >3 FB Neck ROM: Full    Dental  (+) Dental Advisory Given and Teeth Intact   Pulmonary  breath sounds clear to auscultation        Cardiovascular hypertension, Pt. on medications Rhythm:Regular Rate:Normal     Neuro/Psych    GI/Hepatic   Endo/Other    Renal/GU      Musculoskeletal   Abdominal   Peds  Hematology   Anesthesia Other Findings   Reproductive/Obstetrics                          Anesthesia Physical Anesthesia Plan  ASA: III  Anesthesia Plan: General   Post-op Pain Management:    Induction:   Airway Management Planned: Oral ETT  Additional Equipment:   Intra-op Plan:   Post-operative Plan:   Informed Consent: I have reviewed the patients History and Physical, chart, labs and discussed the procedure including the risks, benefits and alternatives for the proposed anesthesia with the patient or authorized representative who has indicated his/her understanding and acceptance.   Dental advisory given  Plan Discussed with: CRNA, Anesthesiologist and Surgeon  Anesthesia Plan Comments:         Anesthesia Quick Evaluation

## 2011-11-17 NOTE — Anesthesia Postprocedure Evaluation (Signed)
  Anesthesia Post-op Note  Patient: Paula Ferguson  Procedure(s) Performed: Procedure(s) (LRB) with comments: STEROID INJECTION (Right) - cortizone injection right thumb REVERSE SHOULDER ARTHROPLASTY (Left) - left reverse shoulder arthroplasty  Patient Location: PACU  Anesthesia Type: GA combined with regional for post-op pain  Level of Consciousness: awake, alert  and oriented  Airway and Oxygen Therapy: Patient Spontanous Breathing and Patient connected to nasal cannula oxygen  Post-op Pain: mild  Post-op Assessment: Post-op Vital signs reviewed  Post-op Vital Signs: Reviewed  Complications: No apparent anesthesia complications

## 2011-11-18 LAB — PROTIME-INR
INR: 1.18 (ref 0.00–1.49)
Prothrombin Time: 14.8 s (ref 11.6–15.2)

## 2011-11-18 LAB — BASIC METABOLIC PANEL
CO2: 25 mEq/L (ref 19–32)
Calcium: 8.5 mg/dL (ref 8.4–10.5)
Creatinine, Ser: 0.58 mg/dL (ref 0.50–1.10)
GFR calc Af Amer: >90 mL/min (ref >90)
GFR calc non Af Amer: >90 mL/min (ref >90)
Sodium: 137 mEq/L (ref 135–145)

## 2011-11-18 LAB — CBC
HCT: 30.9 % — ABNORMAL LOW (ref 36.0–46.0)
Hemoglobin: 10.4 g/dL — ABNORMAL LOW (ref 12.0–15.0)
MCHC: 33.7 g/dL (ref 30.0–36.0)
MCV: 94.5 fL (ref 78.0–100.0)
RDW: 13.7 % (ref 11.5–15.5)

## 2011-11-18 MED ORDER — ENOXAPARIN SODIUM 60 MG/0.6ML ~~LOC~~ SOLN
60.0000 mg | Freq: Two times a day (BID) | SUBCUTANEOUS | Status: DC
  Administered 2011-11-18 – 2011-11-20 (×4): 60 mg via SUBCUTANEOUS
  Filled 2011-11-18 (×6): qty 0.6

## 2011-11-18 MED ORDER — OXYCODONE-ACETAMINOPHEN 5-325 MG PO TABS: 1.0000 | ORAL_TABLET | ORAL | Status: DC | PRN

## 2011-11-18 MED ORDER — WARFARIN SODIUM 7.5 MG PO TABS
7.5000 mg | ORAL_TABLET | Freq: Once | ORAL | Status: AC
  Administered 2011-11-18: 7.5 mg via ORAL
  Filled 2011-11-18: qty 1

## 2011-11-18 MED ORDER — OXYCODONE-ACETAMINOPHEN 5-325 MG PO TABS
1.0000 | ORAL_TABLET | ORAL | Status: DC | PRN
  Administered 2011-11-18 – 2011-11-19 (×3): 2 via ORAL
  Administered 2011-11-19: 1 via ORAL
  Administered 2011-11-19 – 2011-11-20 (×4): 2 via ORAL
  Filled 2011-11-18 (×5): qty 2
  Filled 2011-11-18: qty 1
  Filled 2011-11-18 (×2): qty 2

## 2011-11-18 NOTE — Progress Notes (Addendum)
ANTICOAGULATION CONSULT NOTE - Initial Consult  Pharmacy Consult for Coumadin Indication: Hx of AVR  Allergies  Allergen Reactions  . Hydromorphone Hcl Itching    "Dilaudid"  . Meperidine Hcl Itching  . Morphine Sulfate Itching    Patient Measurements: Height: 5\' 2"  (157.5 cm) Weight: 125 lb 3.2 oz (56.79 kg) IBW/kg (Calculated) : 50.1    Vital Signs: Temp: 99 F (37.2 C) (09/28 0621) BP: 134/72 mmHg (09/28 0621) Pulse Rate: 82  (09/28 0621)  Labs:  Basename 11/18/11 0549 11/17/11 1031 11/17/11 0604  HGB 10.4* 11.6* --  HCT 30.9* 34.0* --  PLT 132* -- --  APTT -- -- 43*  LABPROT 14.8 -- 13.8  INR 1.18 -- 1.07  HEPARINUNFRC -- -- --  CREATININE 0.58 -- --  CKTOTAL -- -- --  CKMB -- -- --  TROPONINI -- -- --    Estimated Creatinine Clearance: 51.8 ml/min (by C-G formula based on Cr of 0.58).   Medical History: Past Medical History  Diagnosis Date  . ANEMIA 09/15/2009  . HYPERLIPIDEMIA 02/07/2007  . HYPERTENSION 09/14/2006  . HYPOTHYROIDISM 09/14/2006  . LIVER FUNCTION TESTS, ABNORMAL, HX OF 01/12/2009  . OLECRANON BURSITIS, LEFT 08/26/2007  . Blood transfusion 2000's; 11/17/2011    S/P "one of my other shoulder surgeries"; S/P left shoulder replacement  . CORONARY ARTERY DISEASE 08/26/2007    LOV Dr Katrinka Blazing 1/13 with EKG and clearance on chart  . Pneumonia     hx  . Heart murmur     hx- surgery  . OSTEOARTHRITIS 02/07/2007    "thumbs; knees; shoulders; ?back"  . PTSD (post-traumatic stress disorder) 1976  . Depression     Medications:  Prescriptions prior to admission  Medication Sig Dispense Refill  . BIOTIN PO Take 1 tablet by mouth daily.      Marland Kitchen buPROPion (WELLBUTRIN SR) 150 MG 12 hr tablet Take 150 mg by mouth daily.      . Calcium Citrate-Vitamin D (CITRACAL PETITES/VITAMIN D PO) Take 2 tablets by mouth daily.      . cholecalciferol (VITAMIN D) 1000 UNITS tablet Take 1,000 Units by mouth daily.      . Cyanocobalamin (VITAMIN B-12 PO) Take 1 tablet by  mouth daily.      Marland Kitchen enoxaparin (LOVENOX) 60 MG/0.6ML injection Inject 60 mg into the skin. To start 11/13/11  After'd/c coumadin      . ferrous sulfate 325 (65 FE) MG tablet Take 325 mg by mouth 2 (two) times daily.      Marland Kitchen levothyroxine (SYNTHROID, LEVOTHROID) 100 MCG tablet Take 100 mcg by mouth daily.      Marland Kitchen losartan (COZAAR) 100 MG tablet Take 100 mg by mouth daily.      . Multiple Vitamins-Minerals (CENTRUM PO) Take 1 tablet by mouth daily.      . Omega-3 Fatty Acids (FISH OIL PO) Take 1 tablet by mouth daily.      . traZODone (DESYREL) 100 MG tablet Take 100 mg by mouth at bedtime.      . verapamil (CALAN-SR) 240 MG CR tablet Take 240 mg by mouth at bedtime.      Marland Kitchen warfarin (COUMADIN) 5 MG tablet Take 5 mg by mouth daily.        Assessment: Paula Ferguson is a pleasant 71yo F who is s/p cortizone injection to R thumb & left reverse shoulder arthroplasty. Noted hx of AVR for which she is on chronic Coumadin PTA. Her home Coumadin dose is 5mg  daily with an INR  range of 2.5-3.5 and was bridged on Lovenox 60mg  SQ 12h prior to surgery.  Noted H/H and plt decrease post surgery. Lovenox dose had been lowered to 30mg  twice daily after surgery. PA examined wound and reports no bleeding and MD gave the OK to bump Lovenox back up to 60mg  twice daily.   Goal of Therapy:  INR 2.5-3.5 Monitor platelets by anticoagulation protocol: Yes   Plan:  - Will give Coumadin 7.5mg  today - Will check PT/INR/CBC daily. - Will plan to d/c Lovenox once INR >/= 1.8 per MD orders.  Thanks, Vania Rea. Darin Engels.D. Clinical Pharmacist Pager [phone] Phone [phone] 11/18/2011 12:27 PM

## 2011-11-18 NOTE — Progress Notes (Signed)
CSW consult for SNF. Dispo is home with [facility] services, per MD d/c summary. No PT/OT recommendations noted. CSW signing off as no other CSW needs identified at this time.  Dellie Burns, MSW, LCSWA [phone] (Weekends 8:00am-4:30pm)

## 2011-11-18 NOTE — Progress Notes (Signed)
Orthopedics Progress Note  Subjective: I feel fine this morning. I would like some oxycodone though as it works a little longer.  Objective:  Filed Vitals:   11/18/11 0621  BP: 134/72  Pulse: 82  Temp: 99 F (37.2 C)  Resp: 18    General: Awake and alert  Musculoskeletal: shoulder dressing CDI, minimal swelling. Neurovascularly intact  Lab Results  Component Value Date   WBC 6.9 11/18/2011   HGB 10.4* 11/18/2011   HCT 30.9* 11/18/2011   MCV 94.5 11/18/2011   PLT 132* 11/18/2011       Component Value Date/Time   NA 137 11/18/2011 0549   K 3.6 11/18/2011 0549   CL 105 11/18/2011 0549   CO2 25 11/18/2011 0549   GLUCOSE 111* 11/18/2011 0549   BUN 9 11/18/2011 0549   CREATININE 0.58 11/18/2011 0549   CALCIUM 8.5 11/18/2011 0549   GFRNONAA >90 11/18/2011 0549   GFRAA >90 11/18/2011 0549    Lab Results  Component Value Date   INR 1.18 11/18/2011   INR 1.07 11/17/2011   INR 2.34* 11/10/2011    Assessment/Plan: POD # 1 s/p Procedure(s): STEROID INJECTION REVERSE SHOULDER ARTHROPLASTY Looking good at this point. Start Oxycodone as needed for pain. D/C tomorrow, D/C summary shared..look under the notes section. Rx on chart.  [name]. [name], MD 11/18/2011 1:10 PM

## 2011-11-18 NOTE — Evaluation (Signed)
Occupational Therapy Evaluation Patient Details Name: Paula Ferguson MRN: [id number] DOB: [birth year] Today's Date: 11/18/2011     OT Assessment / Plan / Recommendation Clinical Impression  Pt presents to OT s/p shoulder surgery per Dr [name]. Pt will benefit from skilled OT to increase I with ADL activty and regain function of LUE    OT Assessment  Patient needs continued OT Services    Follow Up Recommendations  Home health OT       Equipment Recommendations  None recommended by OT       Frequency  Min 2X/week    Precautions / Restrictions Precautions Precautions: Shoulder Type of Shoulder Precautions: NWB. Pt able to perform AAROM FF to 90 degrees per MD Precaution Booklet Issued: Yes (comment)       ADL  Eating/Feeding: Performed;Minimal assistance Where Assessed - Eating/Feeding: Edge of bed Grooming: Performed;Minimal assistance Where Assessed - Grooming: Unsupported standing Upper Body Bathing: Simulated;Minimal assistance Where Assessed - Upper Body Bathing: Unsupported sitting Lower Body Bathing: Simulated;Minimal assistance Where Assessed - Lower Body Bathing: Unsupported sit to stand Upper Body Dressing: Performed;Minimal assistance Where Assessed - Upper Body Dressing: Unsupported standing Lower Body Dressing: Simulated;Minimal assistance Where Assessed - Lower Body Dressing: Unsupported sit to stand Toilet Transfer: Performed;Minimal assistance Toilet Transfer Method: Sit to stand Toilet Transfer Equipment: Comfort height toilet Toileting - Clothing Manipulation and Hygiene: Simulated;Minimal assistance Where Assessed - Glass blower/designer Manipulation and Hygiene: Standing    OT Diagnosis:    OT Problem List: Decreased strength;Decreased range of motion;Impaired UE functional use OT Treatment Interventions: Self-care/ADL training;Therapeutic exercise;DME and/or AE instruction;Patient/family education   OT Goals Acute Rehab OT Goals OT Goal  Formulation: With patient Arm Goals Additional Arm Goal #1: Pt will perform exercises indicated by MD including FF and ER exercise at mod I level with handout  Miscellaneous OT Goals Miscellaneous OT Goal #1: Pt will perform ADL activity, including bathing and dressing, using techniques indicicated by OT for donning shirt, bathing, etc at mod I level using handout as needed  Visit Information  Last OT Received On: 11/18/11    Subjective Data  Subjective: i have had my right shoulder done    Prior Functioning     Home Living Available Help at Discharge: Family Type of Home: House Additional Comments: Daugther will assist as needed with ADL actity Prior Function Level of Independence: Independent Vocation: Retired Musician: No difficulties Dominant Hand: Left            Cognition  Overall Cognitive Status: Appears within functional limits for tasks assessed/performed Arousal/Alertness: Awake/alert Orientation Level: Appears intact for tasks assessed Behavior During Session: [facility] for tasks performed       Mobility Transfers Transfers: Sit to Stand;Stand to Sit Sit to Stand: 5: Supervision Stand to Sit: 5: Supervision     Shoulder Instructions Donning/doffing shirt without moving shoulder: Minimal assistance Method for sponge bathing under operated UE: Minimal assistance Donning/doffing sling/immobilizer: Minimal assistance Correct positioning of sling/immobilizer: Minimal assistance Pendulum exercises (written home exercise program): Minimal assistance ROM for elbow, wrist and digits of operated UE: Minimal assistance Sling wearing schedule (on at all times/off for ADL's): Minimal assistance Proper positioning of operated UE when showering: Minimal assistance Positioning of UE while sleeping: Minimal assistance   Exercise Shoulder Exercises Pendulum Exercise: Left;10 reps;Standing;Other (comment) (handout provided) Shoulder Flexion: AAROM;Other  (comment) (pt able to tolerate 0-40 degrees of FF)      End of Session OT - End of Session Activity Tolerance: Patient tolerated treatment  well Patient left: in chair;with call bell/phone within reach  GO     [facility], [place] 11/18/2011, 1:28 PM

## 2011-11-18 NOTE — Progress Notes (Signed)
Subjective: 1 Day Post-Op Procedure(s) (LRB): STEROID INJECTION (Right) REVERSE SHOULDER ARTHROPLASTY (Left) Patient reports pain as 5 on 0-10 scale.    Objective: Vital signs in last 24 hours: Temp:  [97.2 F (36.2 C)-99 F (37.2 C)] 99 F (37.2 C) (09/28 0621) Pulse Rate:  [75-85] 82  (09/28 0621) Resp:  [12-31] 18  (09/28 0621) BP: (104-134)/(47-72) 134/72 mmHg (09/28 0621) SpO2:  [96 %-100 %] 99 % (09/28 0621) Weight:  [56.79 kg (125 lb 3.2 oz)] 56.79 kg (125 lb 3.2 oz) (09/27 2030)  Intake/Output from previous day: 09/27 0701 - 09/28 0700 In: 2912 [P.O.:480; I.V.:2150; Blood:282] Out: 475 [Urine:150; Drains:75; Blood:250] Intake/Output this shift:     Basename 11/18/11 0549 11/17/11 1031  HGB 10.4* 11.6*    Basename 11/18/11 0549 11/17/11 1031  WBC 6.9 --  RBC 3.27* --  HCT 30.9* 34.0*  PLT 132* --    Basename 11/18/11 0549 11/17/11 1031  NA 137 143  K 3.6 3.8  CL 105 --  CO2 25 --  BUN 9 --  CREATININE 0.58 --  GLUCOSE 111* 96  CALCIUM 8.5 --    Basename 11/18/11 0549 11/17/11 0604  LABPT -- --  INR 1.18 1.07    Sensation intact distally Dressing clean, dry, intact. AROM finger intact  Assessment/Plan: 1 Day Post-Op Procedure(s) (LRB): STEROID INJECTION (Right) REVERSE SHOULDER ARTHROPLASTY (Left) Plan for discharge tomorrow D/Ced hemovac today  Peggy Loge A 11/18/2011, 11:29 AM

## 2011-11-18 NOTE — Progress Notes (Signed)
PT Cancellation Note  Treatment cancelled today due to no acute PT needs.  Spoke with pt and OT who both report pt has no acute PT needs.  Marland Kitchen  Paula Ferguson 11/18/2011, 1:41 PM Paula Ferguson L. Jadyn Brasher DPT [phone]

## 2011-11-18 NOTE — Op Note (Signed)
NAMELAQUONDA, WELBY NO.:  [id number]  MEDICAL RECORD NO.:  [id number]  LOCATION:  5N02C                        FACILITY:  [facility]  PHYSICIAN:  [name]. [name], M.D. DATE OF BIRTH:  Jul 28, 1940  DATE OF PROCEDURE:  11/17/2011 DATE OF DISCHARGE:                              OPERATIVE REPORT   PREOPERATIVE DIAGNOSES: 1. Left shoulder end-stage osteoarthritis with rotator cuff     insufficiency. 2. Right thumb carpometacarpal arthritis.  POSTOPERATIVE DIAGNOSES: 1. Left shoulder end-stage osteoarthritis with rotator cuff     insufficiency. 2. Right thumb carpometacarpal arthritis.  PROCEDURE PERFORMED: 1. Left reverse total shoulder arthroplasty using DePuy Delta     prosthesis. 2. Intraarticular injection of right thumb CMC joint.  ATTENDING SURGEON:  [name]. [name], MD  ASSISTANT:  [name]. Dixon, PA-C, who scrubbed during the entire procedure and necessary for satisfactory completion of the procedure.  ANESTHESIA:  General anesthesia was used plus interscalene block.  ESTIMATED BLOOD LOSS:  200 mL.  FLUID REPLACEMENT:  1 unit packed red cells, 400 mL and 1000 mL of crystalloid.  INSTRUMENT COUNTS:  Correct.  COMPLICATIONS:  There were no complications.  ANTIBIOTICS:  Perioperative antibiotics were given.  INDICATIONS:  The patient is a 71 year old female with worsening left shoulder pain and function secondary to rotator cuff insufficiency and arthritis.  The patient has failed all measures of conservative management including injections, physical therapy, activity modifications, presents for operative treatment.  Informed consent was obtained.  The patient also has end-stage arthritis of the right basal thumb at the [facility] joint.  She desired an injection to be done while anesthetized.  Informed consent was obtained.  DESCRIPTION OF PROCEDURE:  After an adequate level of anesthesia was achieved, the patient was positioned in the modified  beach-chair position.  The right hand was prepped and then 1 mL of lidocaine and 40 mg of Kenalog were injected into the basilar thumb joint, CMC on the right-hand sterilely with a Betadine prep.  We then directed our attention towards the left shoulder.  Left shoulder was correctly identified.  The patient was positioned into a beach-chair position. Right arm was position and padded appropriately.  After sterile prep and drape, time-out was called.  We entered the shoulder in standard deltopectoral approach.  Dissection down through the subcutaneous tissues to the cephalic vein.  We identified that, took it laterally to the deltoid, pectoralis taken medially.  Subscap was taken directly off the bone subperiosteally.  We placed FiberWire sutures in that.  We then released the capsule off the inferior neck of the humerus, progressively externally rotating.  The patient's rotator cuff was degenerative and very scarred anteriorly.  We released a little bit of that supraspinatus progressively, externally rotating.  Then, we could delivered the humerus out of the wound.  We prepared the humerus with sequential reaming up to size 12 and then prepared the metaphyseal portion of the humerus with metaphyseal reamer.  We set the version on 10 degrees of retroversion.  With metaphyseal preparation complete, we impacted the trial first and then the real size 12 Epi 1 left humerus.  Once that was impacted in place, we subluxed the humerus posteriorly.  We  went ahead and freed up the capsule off the subscap.  We removed the capsule as best we could for the entire shoulder protecting the axillary nerve. There was some bleeding posteriorly, requiring Gelfoam and thrombin and pressure that was stopped by the conclusion of surgery.  We then prepped the shoulder, placed our guidepin and then prepared for the metaglene with reaming first, then drilling out the central peg hole.  We then impacted the  metaglene into place.  The patient had extremely small glenoid.  We next used a lock screws superiorly, inferiorly and posteriorly with a 36 into the base of the coracoid, a 48 inferiorly and then a 24 posteriorly.  We could not get a screw in the anterior hole due to not having good bone there.  Once those screws were in place, we went ahead and placed a 38 standard glenosphere and screwed that into position.  We then trialed with a 38+ 3 poly insert, and we had good soft tissue balance and nice range of motion with good clearance, no impingement.  Checked our nerve, it was in good condition.  We replaced the 38+ 3 trial with a real prosthesis.  Once that poly was in place, we impacted that and reduced the shoulder, again happy with soft tissue balancing.  No instability at all.  No impingement.  We thoroughly irrigated the shoulder, made sure that all bleeding was stopped.  We went and placed a drain in the subdeltoid space and sewed it in with Hemovac drain and then closed in layered closure with 0 Vicryl for the deltopectoral interval, 2-0 Vicryl in the subcutaneous closure and 4-0 Monocryl for the skin.  Steri-Strips applied followed by sterile dressing.  The patient tolerated the surgery well.     [name]. [name], M.D.     SRN/MEDQ  D:  11/17/2011  T:  11/18/2011  Job:  [id number]

## 2011-11-19 MED ORDER — BISACODYL 10 MG RE SUPP: 10.0000 mg | Freq: Once | RECTAL | Status: DC

## 2011-11-19 MED ORDER — WARFARIN SODIUM 7.5 MG PO TABS
7.5000 mg | ORAL_TABLET | Freq: Once | ORAL | Status: AC
  Administered 2011-11-19: 7.5 mg via ORAL
  Filled 2011-11-19: qty 1

## 2011-11-19 NOTE — Progress Notes (Signed)
ANTICOAGULATION CONSULT NOTE - Follow up Consult  Pharmacy Consult for Coumadin Indication: Hx of AVR  Allergies  Allergen Reactions  . Hydromorphone Hcl Itching    "Dilaudid"  . Meperidine Hcl Itching  . Morphine Sulfate Itching    Patient Measurements: Height: 5\' 2"  (157.5 cm) Weight: 125 lb 3.2 oz (56.79 kg) IBW/kg (Calculated) : 50.1    Vital Signs: Temp: 98.8 F (37.1 C) (09/29 0617) Temp src: Oral (09/29 0617) BP: 136/80 mmHg (09/29 0617) Pulse Rate: 84  (09/29 0617)  Labs:  Basename 11/19/11 0550 11/18/11 0549 11/17/11 1031 11/17/11 0604  HGB -- 10.4* 11.6* --  HCT -- 30.9* 34.0* --  PLT -- 132* -- --  APTT -- -- -- 43*  LABPROT 16.4* 14.8 -- 13.8  INR 1.35 1.18 -- 1.07  HEPARINUNFRC -- -- -- --  CREATININE -- 0.58 -- --  CKTOTAL -- -- -- --  CKMB -- -- -- --  TROPONINI -- -- -- --    Estimated Creatinine Clearance: 51.8 ml/min (by C-G formula based on Cr of 0.58).   Medical History: Past Medical History  Diagnosis Date  . ANEMIA 09/15/2009  . HYPERLIPIDEMIA 02/07/2007  . HYPERTENSION 09/14/2006  . HYPOTHYROIDISM 09/14/2006  . LIVER FUNCTION TESTS, ABNORMAL, HX OF 01/12/2009  . OLECRANON BURSITIS, LEFT 08/26/2007  . Blood transfusion 2000's; 11/17/2011    S/P "one of my other shoulder surgeries"; S/P left shoulder replacement  . CORONARY ARTERY DISEASE 08/26/2007    LOV Dr Katrinka Blazing 1/13 with EKG and clearance on chart  . Pneumonia     hx  . Heart murmur     hx- surgery  . OSTEOARTHRITIS 02/07/2007    "thumbs; knees; shoulders; ?back"  . PTSD (post-traumatic stress disorder) 1976  . Depression     Medications:  Prescriptions prior to admission  Medication Sig Dispense Refill  . BIOTIN PO Take 1 tablet by mouth daily.      Marland Kitchen buPROPion (WELLBUTRIN SR) 150 MG 12 hr tablet Take 150 mg by mouth daily.      . Calcium Citrate-Vitamin D (CITRACAL PETITES/VITAMIN D PO) Take 2 tablets by mouth daily.      . cholecalciferol (VITAMIN D) 1000 UNITS tablet Take  1,000 Units by mouth daily.      . Cyanocobalamin (VITAMIN B-12 PO) Take 1 tablet by mouth daily.      Marland Kitchen enoxaparin (LOVENOX) 60 MG/0.6ML injection Inject 60 mg into the skin. To start 11/13/11  After'd/c coumadin      . ferrous sulfate 325 (65 FE) MG tablet Take 325 mg by mouth 2 (two) times daily.      Marland Kitchen levothyroxine (SYNTHROID, LEVOTHROID) 100 MCG tablet Take 100 mcg by mouth daily.      Marland Kitchen losartan (COZAAR) 100 MG tablet Take 100 mg by mouth daily.      . Multiple Vitamins-Minerals (CENTRUM PO) Take 1 tablet by mouth daily.      . Omega-3 Fatty Acids (FISH OIL PO) Take 1 tablet by mouth daily.      . traZODone (DESYREL) 100 MG tablet Take 100 mg by mouth at bedtime.      . verapamil (CALAN-SR) 240 MG CR tablet Take 240 mg by mouth at bedtime.      Marland Kitchen warfarin (COUMADIN) 5 MG tablet Take 5 mg by mouth daily.        Assessment: Paula Ferguson is a pleasant 71yo F who is s/p cortizone injection to R thumb & left reverse shoulder arthroplasty. Noted hx of  AVR for which she is on chronic Coumadin PTA. Her home Coumadin dose is 5mg  daily with INR range of 2.5-3.5. Patient currently being bridged with Lovenox. INR today is 1.35 after second Coumadin dose. No bleeding noted.  Goal of Therapy:  INR 2.5-3.5 Monitor platelets by anticoagulation protocol: Yes   Plan:  - Will give Coumadin 7.5mg  today - Will check PT/INR daily. - Continue Lovenox 60 units twice daily - Will plan to d/c Lovenox once INR >/= 1.8 per MD orders.  Thanks, Vania Rea. Darin Engels.D. Clinical Pharmacist Pager [phone] Phone [phone] 11/19/2011 7:59 AM

## 2011-11-19 NOTE — Progress Notes (Signed)
Subjective: 2 Days Post-Op Procedure(s) (LRB): STEROID INJECTION (Right) REVERSE SHOULDER ARTHROPLASTY (Left) Patient reports pain as moderate.  Patient wanting to hold d/c due to pain and non therapeutic INR.  Objective: Vital signs in last 24 hours: Temp:  [97.9 F (36.6 C)-98.8 F (37.1 C)] 98.8 F (37.1 C) (09/29 0617) Pulse Rate:  [76-88] 84  (09/29 0617) Resp:  [16-18] 16  (09/29 0617) BP: (136-141)/(64-80) 136/80 mmHg (09/29 0617) SpO2:  [97 %-98 %] 98 % (09/29 0617)  Intake/Output from previous day: 09/28 0701 - 09/29 0700 In: 720 [P.O.:720] Out: -  Intake/Output this shift:     Basename 11/18/11 0549 11/17/11 1031  HGB 10.4* 11.6*    Basename 11/18/11 0549 11/17/11 1031  WBC 6.9 --  RBC 3.27* --  HCT 30.9* 34.0*  PLT 132* --    Basename 11/18/11 0549 11/17/11 1031  NA 137 143  K 3.6 3.8  CL 105 --  CO2 25 --  BUN 9 --  CREATININE 0.58 --  GLUCOSE 111* 96  CALCIUM 8.5 --    Basename 11/19/11 0550 11/18/11 0549  LABPT -- --  INR 1.35 1.18    General- well developed no acute distress Left hand - NVI Left shoulder - dressing clean dry and intact  Assessment/Plan: 2 Days Post-Op Procedure(s) (LRB): STEROID INJECTION (Right) REVERSE SHOULDER ARTHROPLASTY (Left) Plan for discharge tomorrow Monitor INR  Lurline Caver 11/19/2011, 1:29 PM

## 2011-11-20 ENCOUNTER — Encounter (HOSPITAL_COMMUNITY): Payer: Self-pay | Admitting: Orthopedic Surgery

## 2011-11-20 LAB — TYPE AND SCREEN
ABO/RH(D): O POS
Antibody Screen: NEGATIVE
Unit division: 0

## 2011-11-20 LAB — PROTIME-INR: Prothrombin Time: 19.3 seconds — ABNORMAL HIGH (ref 11.6–15.2)

## 2011-11-20 MED ORDER — OXYCODONE-ACETAMINOPHEN 5-325 MG PO TABS: 1.0000 | ORAL_TABLET | ORAL | Status: DC | PRN

## 2011-11-20 MED ORDER — WARFARIN SODIUM 7.5 MG PO TABS
7.5000 mg | ORAL_TABLET | ORAL | Status: AC
  Administered 2011-11-20: 7.5 mg via ORAL
  Filled 2011-11-20: qty 1

## 2011-11-20 NOTE — Progress Notes (Signed)
Occupational Therapy Treatment Patient Details Name: Paula Ferguson MRN: [id number] DOB: Apr 21, 1940 Today's Date: 11/20/2011 Time: 0810-0826 OT Time Calculation (min): 16 min  OT Assessment / Plan / Recommendation Comments on Treatment Session Pt. required verbal cueing for correct stance while performing pendulum exercises. Educated pt. on FF and ER exercises supine in bed. Pt. able to verbalize correct sequence for donning a shirt and was modified independent to bath LUE. Pt. stated her daughter will be there to (A) with any needs once d/c home.      Follow Up Recommendations  Home health OT    Barriers to Discharge       Equipment Recommendations  None recommended by OT    Recommendations for Other Services    Frequency Min 2X/week   Plan Discharge plan remains appropriate    Precautions / Restrictions Precautions Precautions: Shoulder Type of Shoulder Precautions: NWB. Pt able to perform AAROM FF to 90 degrees per MD   Pertinent Vitals/Pain None reported    ADL  Grooming: Performed;Brushing hair;Wash/dry hands;Set up Where Assessed - Grooming: Unsupported sitting Upper Body Bathing: Performed;Modified independent Where Assessed - Upper Body Bathing: Unsupported sitting Upper Body Dressing: Simulated (Able to verbalize correct sequencing. ) Where Assessed - Upper Body Dressing: Unsupported sitting Transfers/Ambulation Related to ADLs: Pt. up and ambulating around room independently  ADL Comments: Pt. able to complete LUE bathing with modified independence, requires min (A) for bathing RUE. Pt. wanted to wait until daughter arrived to get dressed but was able to verbalize correct sequencing for donning a shirt. Pt. states she has gone through this before with her previous shoulder surgeries.      OT Diagnosis:    OT Problem List:   OT Treatment Interventions:     OT Goals Acute Rehab OT Goals OT Goal Formulation: With patient Time For Goal Achievement:  11/26/11 Potential to Achieve Goals: Good Arm Goals Additional Arm Goal #1: Pt will perform exercises indicated by MD including FF and ER exercise at mod I level with handout  Arm Goal: Additional Goal #1 - Progress: Progressing toward goals Miscellaneous OT Goals Miscellaneous OT Goal #1: Pt will perform ADL activity, including bathing and dressing, using techniques indicicated by OT for donning shirt, bathing, etc at mod I level using handout as needed OT Goal: Miscellaneous Goal #1 - Progress: Met  Visit Information  Last OT Received On: 11/20/11 Assistance Needed: +1    Subjective Data      Prior Functioning       Cognition  Overall Cognitive Status: Appears within functional limits for tasks assessed/performed Arousal/Alertness: Awake/alert Orientation Level: Appears intact for tasks assessed Behavior During Session: [facility] for tasks performed    Mobility  Shoulder Instructions Bed Mobility Bed Mobility: Supine to Sit;Sit to Supine;Sitting - Scoot to Edge of Bed Supine to Sit: 7: Independent;HOB elevated Sitting - Scoot to Edge of Bed: 7: Independent Sit to Supine: 7: Independent;HOB elevated Transfers Transfers: Sit to Stand;Stand to Sit Sit to Stand: 5: Supervision Stand to Sit: 5: Supervision   Donning/doffing shirt without moving shoulder: Patient able to independently direct caregiver Method for sponge bathing under operated UE: Patient able to independently direct caregiver Donning/doffing sling/immobilizer: Supervision/safety Correct positioning of sling/immobilizer: Supervision/safety Pendulum exercises (written home exercise program): Minimal assistance ROM for elbow, wrist and digits of operated UE: Supervision/safety Sling wearing schedule (on at all times/off for ADL's): Supervision/safety Proper positioning of operated UE when showering: Supervision/safety Positioning of UE while sleeping: Supervision/safety   Exercises  Shoulder  Exercises Pendulum  Exercise: Left;10 reps;Standing Shoulder Flexion: AAROM;10 reps;Supine Shoulder External Rotation: AAROM;10 reps;Supine Elbow Flexion: AROM;Seated;Standing Elbow Extension: AROM;Seated;Standing Wrist Flexion: AROM;Seated;Standing Wrist Extension: AROM;Standing;Seated Digit Composite Flexion: AROM;Standing;Seated Composite Extension: AROM;Seated;Standing Neck Flexion: AROM;Seated;Standing Neck Extension: AROM;Seated;Standing Neck Lateral Flexion - Right: AROM;Seated;Standing Neck Lateral Flexion - Left: AROM;Seated;Standing   Balance     End of Session OT - End of Session Activity Tolerance: Patient tolerated treatment well Patient left: in bed;with call bell/phone within reach  GO     Paula Ferguson 11/20/2011, 8:43 AM

## 2011-11-20 NOTE — Progress Notes (Signed)
CARE MANAGEMENT NOTE 11/20/2011  Patient:  Paula Ferguson, Paula Ferguson   Account Number:  [id number]  Date Initiated:  11/20/2011  Documentation initiated by:  Vance Peper  Subjective/Objective Assessment:   71 yr old female s/p left reverse shoulder arthroplasty     Action/Plan:   CM spoke with patient.choice offered, patient preoperatively setup with [facility], no changes. Patient has support at discharge.   Anticipated DC Date:  11/20/2011   Anticipated DC Plan:  HOME W HOME HEALTH SERVICES      DC Planning Services  CM consult      [facility] Choice  HOME HEALTH   Choice offered to / List presented to:  C-1 Patient        HH arranged  HH-2 PT  HH-3 OT      [facility] agency  [facility]   Status of service:  Completed, signed off Medicare Important Message given?   (If response is "NO", the following Medicare IM given date fields will be blank) Date Medicare IM given:   Date Additional Medicare IM given:    Discharge Disposition:  HOME W HOME HEALTH SERVICES  Per UR Regulation:    If discussed at Long Length of Stay Meetings, dates discussed:    Comments:

## 2011-11-20 NOTE — Progress Notes (Signed)
I agree with the following treatment note after reviewing documentation.   Johnston, Wille Aubuchon Brynn   OTR/L Pager: [phone] Office: [phone] .   

## 2011-11-20 NOTE — Progress Notes (Signed)
ANTICOAGULATION CONSULT - COUMADIN  Pharmacy Consult for Coumadin  HPI: 71 y.o.female admitted for left shoulder oa and right thumb oa  who is currently on chronic warfarin for history of AVR.  Allergies: Allergies  Allergen Reactions  . Hydromorphone Hcl Itching    "Dilaudid"  . Meperidine Hcl Itching  . Morphine Sulfate Itching    Height/Weight: Height: 5\' 2"  (157.5 cm) Weight: 125 lb 3.2 oz (56.79 kg) IBW/kg (Calculated) : 50.1   Vitals: Blood pressure 101/58, pulse 69, temperature 98.7 F (37.1 C), temperature source Oral, resp. rate 16, height 5\' 2"  (1.575 m), weight 125 lb 3.2 oz (56.79 kg), SpO2 95.00%.  Current active problems: Active Problems:  * No active hospital problems. *    Medical / Surgical History: Past Medical History  Diagnosis Date  . ANEMIA 09/15/2009  . HYPERLIPIDEMIA 02/07/2007  . HYPERTENSION 09/14/2006  . HYPOTHYROIDISM 09/14/2006  . LIVER FUNCTION TESTS, ABNORMAL, HX OF 01/12/2009  . OLECRANON BURSITIS, LEFT 08/26/2007  . Blood transfusion 2000's; 11/17/2011    S/P "one of my other shoulder surgeries"; S/P left shoulder replacement  . CORONARY ARTERY DISEASE 08/26/2007    LOV Dr Katrinka Blazing 1/13 with EKG and clearance on chart  . Pneumonia     hx  . Heart murmur     hx- surgery  . OSTEOARTHRITIS 02/07/2007    "thumbs; knees; shoulders; ?back"  . PTSD (post-traumatic stress disorder) 1976  . Depression    Past Surgical History  Procedure Date  . Joint replacement     right shoulder arthroscopy, left partial knee replacement  . Colonoscopy   . Total knee arthroplasty 03/21/2011    Procedure: TOTAL KNEE ARTHROPLASTY;  Surgeon: Shelda Pal, MD;  Location: [facility];  Service: Orthopedics;  Laterality: Right;  . Total shoulder replacement 2012; 11/17/2011    right; left  . Tonsillectomy and adenoidectomy ~ 1964  . Appendectomy 1981  . Abdominal hysterectomy 1981  . Cholecystectomy 1990's  . Knee arthroscopy 1980's; 2012    left; right  .  Partial knee arthroplasty 1990's    left  . Transfer / transplant ankle tendon superficial / deep 1990's    "bunch of surgeries after dr ruptured posterior tibial tendon", right foot  . Carpal tunnel release 1990's    bilaterally  . Shoulder arthroscopy 2000's    right; "3 surgeries";  Marland Kitchen Shoulder arthroscopy w/ rotator cuff repair 1990's  . Cardiac valve replacement 2003    "aortic"  . Coronary artery bypass graft 2003    CABG X1 "mammary" w/aortic valve replacement   . Cardiac catheterization 2003  . Dilation and curettage of uterus 1981?  Marland Kitchen Fracture surgery   . Exploratory laparotomy ~ 1981    Current Labs:   Basename 11/20/11 0602 11/19/11 0550 11/18/11 0549 11/17/11 1031  HGB -- -- 10.4* 11.6*  HCT -- -- 30.9* 34.0*  PLT -- -- 132* --  LABPROT 19.3* 16.4* 14.8 --  INR 1.69* 1.35 1.18 --   Lab Results  Component Value Date   INR 1.69* 11/20/2011   INR 1.35 11/19/2011   INR 1.18 11/18/2011   Estimated Creatinine Clearance: 51.8 ml/min (by C-G formula based on Cr of 0.58).  Pertinent Medication History: Medication Sig  . warfarin (COUMADIN) 5 MG tablet Take 5 mg by mouth daily.   Scheduled:    . bisacodyl  10 mg Rectal Once  . buPROPion  150 mg Oral Daily  . cholecalciferol  1,000 Units Oral Daily  . enoxaparin (LOVENOX) injection  60 mg Subcutaneous Q12H  . ferrous sulfate  325 mg Oral BID  . levothyroxine  100 mcg Oral Daily  . losartan  100 mg Oral Daily  . omega-3 acid ethyl esters  1 g Oral Daily  . traZODone  100 mg Oral QHS  . verapamil  240 mg Oral QHS  . vitamin B-12  1,000 mcg Oral Daily  . warfarin  7.5 mg Oral ONCE-1800  . Warfarin - Pharmacist Dosing Inpatient   Does not apply q1800    Assessment:  Today's INR slowly approaching therapeutic goal, but remains < 2.  Patient being discharged home today.  Goals:  Target INR of 2.5-3.5.  Plan:  Will give a dose of Coumadin 7.5 mg prior to discharge.  She will continue Lovenox bridging as  outpatient until INR at goal.  Patient will see her Cardiologist tomorrow for follow up and resume her home Coumadin dose in the evening.   Purva Vessell, Elisha Headland, Pharm. D. 11/20/2011, 10:20 AM

## 2011-11-20 NOTE — Progress Notes (Signed)
Orthopedics Progress Note  Subjective: Pt feeling much better today Ready for discharge home  Objective:  Filed Vitals:   11/20/11 0612  BP: 101/58  Pulse: 69  Temp: 98.7 F (37.1 C)  Resp: 16    General: Awake and alert  Musculoskeletal: left shoulder incision healing well, nv intact distally Neurovascularly intact  Lab Results  Component Value Date   WBC 6.9 11/18/2011   HGB 10.4* 11/18/2011   HCT 30.9* 11/18/2011   MCV 94.5 11/18/2011   PLT 132* 11/18/2011       Component Value Date/Time   NA 137 11/18/2011 0549   K 3.6 11/18/2011 0549   CL 105 11/18/2011 0549   CO2 25 11/18/2011 0549   GLUCOSE 111* 11/18/2011 0549   BUN 9 11/18/2011 0549   CREATININE 0.58 11/18/2011 0549   CALCIUM 8.5 11/18/2011 0549   GFRNONAA >90 11/18/2011 0549   GFRAA >90 11/18/2011 0549    Lab Results  Component Value Date   INR 1.69* 11/20/2011   INR 1.35 11/19/2011   INR 1.18 11/18/2011    Assessment/Plan: POD #3 s/p Procedure(s):right thumb STEROID INJECTION Left REVERSE SHOULDER ARTHROPLASTY  D/c home today  F/u in 2 weeks  [name]. [name], MD 11/20/2011 8:04 AM

## 2011-11-22 DIAGNOSIS — Z954 Presence of other heart-valve replacement: Secondary | ICD-10-CM | POA: Diagnosis not present

## 2011-11-22 DIAGNOSIS — Z7901 Long term (current) use of anticoagulants: Secondary | ICD-10-CM | POA: Diagnosis not present

## 2011-11-25 DIAGNOSIS — Z7901 Long term (current) use of anticoagulants: Secondary | ICD-10-CM | POA: Diagnosis not present

## 2011-11-25 DIAGNOSIS — Z5189 Encounter for other specified aftercare: Secondary | ICD-10-CM | POA: Diagnosis not present

## 2011-11-25 DIAGNOSIS — Z96619 Presence of unspecified artificial shoulder joint: Secondary | ICD-10-CM | POA: Diagnosis not present

## 2011-11-25 DIAGNOSIS — M25519 Pain in unspecified shoulder: Secondary | ICD-10-CM | POA: Diagnosis not present

## 2011-11-25 DIAGNOSIS — Z4801 Encounter for change or removal of surgical wound dressing: Secondary | ICD-10-CM | POA: Diagnosis not present

## 2011-11-25 DIAGNOSIS — Z471 Aftercare following joint replacement surgery: Secondary | ICD-10-CM | POA: Diagnosis not present

## 2011-11-28 DIAGNOSIS — Z4801 Encounter for change or removal of surgical wound dressing: Secondary | ICD-10-CM | POA: Diagnosis not present

## 2011-11-28 DIAGNOSIS — Z954 Presence of other heart-valve replacement: Secondary | ICD-10-CM | POA: Diagnosis not present

## 2011-11-28 DIAGNOSIS — Z471 Aftercare following joint replacement surgery: Secondary | ICD-10-CM | POA: Diagnosis not present

## 2011-11-28 DIAGNOSIS — Z7901 Long term (current) use of anticoagulants: Secondary | ICD-10-CM | POA: Diagnosis not present

## 2011-11-28 DIAGNOSIS — Z96619 Presence of unspecified artificial shoulder joint: Secondary | ICD-10-CM | POA: Diagnosis not present

## 2011-11-28 DIAGNOSIS — Z5189 Encounter for other specified aftercare: Secondary | ICD-10-CM | POA: Diagnosis not present

## 2011-11-29 DIAGNOSIS — M19019 Primary osteoarthritis, unspecified shoulder: Secondary | ICD-10-CM | POA: Diagnosis not present

## 2011-12-01 DIAGNOSIS — Z96619 Presence of unspecified artificial shoulder joint: Secondary | ICD-10-CM | POA: Diagnosis not present

## 2011-12-01 DIAGNOSIS — Z7901 Long term (current) use of anticoagulants: Secondary | ICD-10-CM | POA: Diagnosis not present

## 2011-12-01 DIAGNOSIS — Z5189 Encounter for other specified aftercare: Secondary | ICD-10-CM | POA: Diagnosis not present

## 2011-12-01 DIAGNOSIS — Z471 Aftercare following joint replacement surgery: Secondary | ICD-10-CM | POA: Diagnosis not present

## 2011-12-01 DIAGNOSIS — Z4801 Encounter for change or removal of surgical wound dressing: Secondary | ICD-10-CM | POA: Diagnosis not present

## 2011-12-04 DIAGNOSIS — Z7901 Long term (current) use of anticoagulants: Secondary | ICD-10-CM | POA: Diagnosis not present

## 2011-12-04 DIAGNOSIS — Z96619 Presence of unspecified artificial shoulder joint: Secondary | ICD-10-CM | POA: Diagnosis not present

## 2011-12-04 DIAGNOSIS — Z471 Aftercare following joint replacement surgery: Secondary | ICD-10-CM | POA: Diagnosis not present

## 2011-12-04 DIAGNOSIS — Z5189 Encounter for other specified aftercare: Secondary | ICD-10-CM | POA: Diagnosis not present

## 2011-12-04 DIAGNOSIS — Z4801 Encounter for change or removal of surgical wound dressing: Secondary | ICD-10-CM | POA: Diagnosis not present

## 2011-12-05 DIAGNOSIS — Z954 Presence of other heart-valve replacement: Secondary | ICD-10-CM | POA: Diagnosis not present

## 2011-12-05 DIAGNOSIS — Z7901 Long term (current) use of anticoagulants: Secondary | ICD-10-CM | POA: Diagnosis not present

## 2011-12-07 DIAGNOSIS — Z7901 Long term (current) use of anticoagulants: Secondary | ICD-10-CM | POA: Diagnosis not present

## 2011-12-07 DIAGNOSIS — Z96619 Presence of unspecified artificial shoulder joint: Secondary | ICD-10-CM | POA: Diagnosis not present

## 2011-12-07 DIAGNOSIS — Z4801 Encounter for change or removal of surgical wound dressing: Secondary | ICD-10-CM | POA: Diagnosis not present

## 2011-12-07 DIAGNOSIS — Z5189 Encounter for other specified aftercare: Secondary | ICD-10-CM | POA: Diagnosis not present

## 2011-12-07 DIAGNOSIS — Z471 Aftercare following joint replacement surgery: Secondary | ICD-10-CM | POA: Diagnosis not present

## 2011-12-12 DIAGNOSIS — Z7901 Long term (current) use of anticoagulants: Secondary | ICD-10-CM | POA: Diagnosis not present

## 2011-12-12 DIAGNOSIS — Z4801 Encounter for change or removal of surgical wound dressing: Secondary | ICD-10-CM | POA: Diagnosis not present

## 2011-12-12 DIAGNOSIS — Z471 Aftercare following joint replacement surgery: Secondary | ICD-10-CM | POA: Diagnosis not present

## 2011-12-12 DIAGNOSIS — Z5189 Encounter for other specified aftercare: Secondary | ICD-10-CM | POA: Diagnosis not present

## 2011-12-12 DIAGNOSIS — Z96619 Presence of unspecified artificial shoulder joint: Secondary | ICD-10-CM | POA: Diagnosis not present

## 2011-12-15 DIAGNOSIS — Z96619 Presence of unspecified artificial shoulder joint: Secondary | ICD-10-CM | POA: Diagnosis not present

## 2011-12-15 DIAGNOSIS — Z7901 Long term (current) use of anticoagulants: Secondary | ICD-10-CM | POA: Diagnosis not present

## 2011-12-15 DIAGNOSIS — Z5189 Encounter for other specified aftercare: Secondary | ICD-10-CM | POA: Diagnosis not present

## 2011-12-15 DIAGNOSIS — Z471 Aftercare following joint replacement surgery: Secondary | ICD-10-CM | POA: Diagnosis not present

## 2011-12-15 DIAGNOSIS — Z4801 Encounter for change or removal of surgical wound dressing: Secondary | ICD-10-CM | POA: Diagnosis not present

## 2011-12-19 DIAGNOSIS — Z4801 Encounter for change or removal of surgical wound dressing: Secondary | ICD-10-CM | POA: Diagnosis not present

## 2011-12-19 DIAGNOSIS — Z471 Aftercare following joint replacement surgery: Secondary | ICD-10-CM | POA: Diagnosis not present

## 2011-12-19 DIAGNOSIS — Z5189 Encounter for other specified aftercare: Secondary | ICD-10-CM | POA: Diagnosis not present

## 2011-12-19 DIAGNOSIS — Z96619 Presence of unspecified artificial shoulder joint: Secondary | ICD-10-CM | POA: Diagnosis not present

## 2011-12-19 DIAGNOSIS — Z7901 Long term (current) use of anticoagulants: Secondary | ICD-10-CM | POA: Diagnosis not present

## 2011-12-21 DIAGNOSIS — Z5189 Encounter for other specified aftercare: Secondary | ICD-10-CM | POA: Diagnosis not present

## 2011-12-21 DIAGNOSIS — Z471 Aftercare following joint replacement surgery: Secondary | ICD-10-CM | POA: Diagnosis not present

## 2011-12-21 DIAGNOSIS — Z96619 Presence of unspecified artificial shoulder joint: Secondary | ICD-10-CM | POA: Diagnosis not present

## 2011-12-21 DIAGNOSIS — Z4801 Encounter for change or removal of surgical wound dressing: Secondary | ICD-10-CM | POA: Diagnosis not present

## 2011-12-21 DIAGNOSIS — Z7901 Long term (current) use of anticoagulants: Secondary | ICD-10-CM | POA: Diagnosis not present

## 2011-12-26 ENCOUNTER — Ambulatory Visit (INDEPENDENT_AMBULATORY_CARE_PROVIDER_SITE_OTHER): Payer: Medicare Other | Admitting: Internal Medicine

## 2011-12-26 ENCOUNTER — Encounter: Payer: Self-pay | Admitting: Internal Medicine

## 2011-12-26 VITALS — BP 94/60 | Temp 97.9°F | Wt 123.0 lb

## 2011-12-26 DIAGNOSIS — B029 Zoster without complications: Secondary | ICD-10-CM

## 2011-12-26 DIAGNOSIS — M1990 Unspecified osteoarthritis, unspecified site: Secondary | ICD-10-CM | POA: Diagnosis not present

## 2011-12-26 DIAGNOSIS — Z7901 Long term (current) use of anticoagulants: Secondary | ICD-10-CM

## 2011-12-26 DIAGNOSIS — I10 Essential (primary) hypertension: Secondary | ICD-10-CM

## 2011-12-26 DIAGNOSIS — Z5181 Encounter for therapeutic drug level monitoring: Secondary | ICD-10-CM | POA: Diagnosis not present

## 2011-12-26 DIAGNOSIS — Z954 Presence of other heart-valve replacement: Secondary | ICD-10-CM | POA: Diagnosis not present

## 2011-12-26 MED ORDER — HYDROCODONE-ACETAMINOPHEN 7.5-325 MG PO TABS: 1.0000 | ORAL_TABLET | Freq: Four times a day (QID) | ORAL | Status: DC | PRN

## 2011-12-26 MED ORDER — VALACYCLOVIR HCL 1 G PO TABS: 1000.0000 mg | ORAL_TABLET | Freq: Two times a day (BID) | ORAL | Status: DC

## 2011-12-26 MED ORDER — LEVOTHYROXINE SODIUM 100 MCG PO TABS: 100.0000 ug | ORAL_TABLET | Freq: Every day | ORAL | Status: DC

## 2011-12-26 NOTE — Progress Notes (Signed)
Subjective:    Patient ID: Paula Ferguson, female    DOB: 10-06-1940, 71 y.o.   MRN: [id number]  HPI 71 year old patient who presents with a three-day rash involving the right ear and scalp area rash is quite painful. She has had a shingles vaccine in the past.  Past Medical History  Diagnosis Date  . ANEMIA 09/15/2009  . HYPERLIPIDEMIA 02/07/2007  . HYPERTENSION 09/14/2006  . HYPOTHYROIDISM 09/14/2006  . LIVER FUNCTION TESTS, ABNORMAL, HX OF 01/12/2009  . OLECRANON BURSITIS, LEFT 08/26/2007  . Blood transfusion 2000's; 11/17/2011    S/P "one of my other shoulder surgeries"; S/P left shoulder replacement  . CORONARY ARTERY DISEASE 08/26/2007    LOV Dr Katrinka Blazing 1/13 with EKG and clearance on chart  . Pneumonia     hx  . Heart murmur     hx- surgery  . OSTEOARTHRITIS 02/07/2007    "thumbs; knees; shoulders; ?back"  . PTSD (post-traumatic stress disorder) 1976  . Depression     History   Social History  . Marital Status: Widowed    Spouse Name: N/A    Number of Children: N/A  . Years of Education: N/A   Occupational History  . Not on file.   Social History Main Topics  . Smoking status: Never Smoker   . Smokeless tobacco: Never Used  . Alcohol Use: No  . Drug Use: No  . Sexually Active: Not Currently   Other Topics Concern  . Not on file   Social History Narrative  . No narrative on file    Past Surgical History  Procedure Date  . Joint replacement     right shoulder arthroscopy, left partial knee replacement  . Colonoscopy   . Total knee arthroplasty 03/21/2011    Procedure: TOTAL KNEE ARTHROPLASTY;  Surgeon: Shelda Pal, MD;  Location: [facility];  Service: Orthopedics;  Laterality: Right;  . Total shoulder replacement 2012; 11/17/2011    right; left  . Tonsillectomy and adenoidectomy ~ 1964  . Appendectomy 1981  . Abdominal hysterectomy 1981  . Cholecystectomy 1990's  . Knee arthroscopy 1980's; 2012    left; right  . Partial knee arthroplasty 1990's    left    . Transfer / transplant ankle tendon superficial / deep 1990's    "bunch of surgeries after dr ruptured posterior tibial tendon", right foot  . Carpal tunnel release 1990's    bilaterally  . Shoulder arthroscopy 2000's    right; "3 surgeries";  Marland Kitchen Shoulder arthroscopy w/ rotator cuff repair 1990's  . Cardiac valve replacement 2003    "aortic"  . Coronary artery bypass graft 2003    CABG X1 "mammary" w/aortic valve replacement   . Cardiac catheterization 2003  . Dilation and curettage of uterus 1981?  Marland Kitchen Fracture surgery   . Exploratory laparotomy ~ 1981  . Steriod injection 11/17/2011    Procedure: STEROID INJECTION;  Surgeon: Verlee Rossetti, MD;  Location: [facility] OR;  Service: Orthopedics;  Laterality: Right;  cortizone injection right thumb  . Reverse shoulder arthroplasty 11/17/2011    Procedure: REVERSE SHOULDER ARTHROPLASTY;  Surgeon: Verlee Rossetti, MD;  Location: [facility] OR;  Service: Orthopedics;  Laterality: Left;  left reverse shoulder arthroplasty    No family history on file.  Allergies  Allergen Reactions  . Hydromorphone Hcl Itching    "Dilaudid"  . Meperidine Hcl Itching  . Morphine Sulfate Itching    Current Outpatient Prescriptions on File Prior to Visit  Medication Sig Dispense Refill  . BIOTIN  PO Take 1 tablet by mouth daily.      Marland Kitchen buPROPion (WELLBUTRIN SR) 150 MG 12 hr tablet Take 150 mg by mouth daily.      . Calcium Citrate-Vitamin D (CITRACAL PETITES/VITAMIN D PO) Take 2 tablets by mouth daily.      . cholecalciferol (VITAMIN D) 1000 UNITS tablet Take 1,000 Units by mouth daily.      . Cyanocobalamin (VITAMIN B-12 PO) Take 1 tablet by mouth daily.      Marland Kitchen enoxaparin (LOVENOX) 60 MG/0.6ML injection Inject 60 mg into the skin. To start 11/13/11  After'd/c coumadin      . ferrous sulfate 325 (65 FE) MG tablet Take 325 mg by mouth 2 (two) times daily.      Marland Kitchen HYDROcodone-acetaminophen (NORCO) 7.5-325 MG per tablet Take 1 tablet by mouth every 6 (six) hours as needed  for pain.  60 tablet  0  . levothyroxine (SYNTHROID, LEVOTHROID) 100 MCG tablet Take 100 mcg by mouth daily.      Marland Kitchen losartan (COZAAR) 100 MG tablet Take 100 mg by mouth daily.      . methocarbamol (ROBAXIN) 500 MG tablet Take 1 tablet (500 mg total) by mouth 3 (three) times daily as needed.  60 tablet  1  . Multiple Vitamins-Minerals (CENTRUM PO) Take 1 tablet by mouth daily.      . Omega-3 Fatty Acids (FISH OIL PO) Take 1 tablet by mouth daily.      . traZODone (DESYREL) 100 MG tablet Take 100 mg by mouth at bedtime.      . verapamil (CALAN-SR) 240 MG CR tablet Take 240 mg by mouth at bedtime.      Marland Kitchen warfarin (COUMADIN) 5 MG tablet Take 5 mg by mouth daily.      Marland Kitchen oxyCODONE-acetaminophen (PERCOCET/ROXICET) 5-325 MG per tablet Take 1-2 tablets by mouth every 4 (four) hours as needed.  30 tablet  0    BP 94/60  Temp 97.9 F (36.6 C) (Oral)  Wt 123 lb (55.792 kg)       Review of Systems  Skin: Positive for rash.       Objective:   Physical Exam  Skin:       Vesiculo-papular lesion involving the right ear and the right scalp near the hairline          Assessment & Plan:   Shingles right ear and scalp. Will treat with Valtrex. We'll continue analgesics

## 2011-12-26 NOTE — Patient Instructions (Signed)

## 2011-12-27 DIAGNOSIS — M25519 Pain in unspecified shoulder: Secondary | ICD-10-CM | POA: Diagnosis not present

## 2011-12-29 DIAGNOSIS — Z954 Presence of other heart-valve replacement: Secondary | ICD-10-CM | POA: Diagnosis not present

## 2011-12-29 DIAGNOSIS — Z7901 Long term (current) use of anticoagulants: Secondary | ICD-10-CM | POA: Diagnosis not present

## 2012-01-03 DIAGNOSIS — Z954 Presence of other heart-valve replacement: Secondary | ICD-10-CM | POA: Diagnosis not present

## 2012-01-03 DIAGNOSIS — Z7901 Long term (current) use of anticoagulants: Secondary | ICD-10-CM | POA: Diagnosis not present

## 2012-01-12 DIAGNOSIS — Z7901 Long term (current) use of anticoagulants: Secondary | ICD-10-CM | POA: Diagnosis not present

## 2012-01-12 DIAGNOSIS — Z954 Presence of other heart-valve replacement: Secondary | ICD-10-CM | POA: Diagnosis not present

## 2012-01-26 DIAGNOSIS — M19049 Primary osteoarthritis, unspecified hand: Secondary | ICD-10-CM | POA: Diagnosis not present

## 2012-01-30 DIAGNOSIS — M25519 Pain in unspecified shoulder: Secondary | ICD-10-CM | POA: Diagnosis not present

## 2012-01-31 ENCOUNTER — Other Ambulatory Visit: Payer: Self-pay | Admitting: Orthopedic Surgery

## 2012-01-31 DIAGNOSIS — M25512 Pain in left shoulder: Secondary | ICD-10-CM

## 2012-02-02 ENCOUNTER — Ambulatory Visit
Admission: RE | Admit: 2012-02-02 | Discharge: 2012-02-02 | Disposition: A | Discharge status: Home / Self Care | Payer: Medicare Other | Source: Ambulatory Visit | Attending: Orthopedic Surgery | Admitting: Orthopedic Surgery

## 2012-02-02 DIAGNOSIS — M25519 Pain in unspecified shoulder: Secondary | ICD-10-CM | POA: Diagnosis not present

## 2012-02-02 DIAGNOSIS — M25512 Pain in left shoulder: Secondary | ICD-10-CM

## 2012-02-06 DIAGNOSIS — Z954 Presence of other heart-valve replacement: Secondary | ICD-10-CM | POA: Diagnosis not present

## 2012-02-06 DIAGNOSIS — Z7901 Long term (current) use of anticoagulants: Secondary | ICD-10-CM | POA: Diagnosis not present

## 2012-03-01 DIAGNOSIS — Z7901 Long term (current) use of anticoagulants: Secondary | ICD-10-CM | POA: Diagnosis not present

## 2012-03-01 DIAGNOSIS — Z954 Presence of other heart-valve replacement: Secondary | ICD-10-CM | POA: Diagnosis not present

## 2012-03-14 DIAGNOSIS — Z954 Presence of other heart-valve replacement: Secondary | ICD-10-CM | POA: Diagnosis not present

## 2012-03-14 DIAGNOSIS — Z7901 Long term (current) use of anticoagulants: Secondary | ICD-10-CM | POA: Diagnosis not present

## 2012-03-18 ENCOUNTER — Ambulatory Visit: Payer: Medicare Other | Admitting: Internal Medicine

## 2012-03-26 DIAGNOSIS — I10 Essential (primary) hypertension: Secondary | ICD-10-CM | POA: Diagnosis not present

## 2012-03-26 DIAGNOSIS — Z7901 Long term (current) use of anticoagulants: Secondary | ICD-10-CM | POA: Diagnosis not present

## 2012-03-26 DIAGNOSIS — Z954 Presence of other heart-valve replacement: Secondary | ICD-10-CM | POA: Diagnosis not present

## 2012-03-26 DIAGNOSIS — I2510 Atherosclerotic heart disease of native coronary artery without angina pectoris: Secondary | ICD-10-CM | POA: Diagnosis not present

## 2012-03-27 DIAGNOSIS — M25519 Pain in unspecified shoulder: Secondary | ICD-10-CM | POA: Diagnosis not present

## 2012-04-09 DIAGNOSIS — Z954 Presence of other heart-valve replacement: Secondary | ICD-10-CM | POA: Diagnosis not present

## 2012-04-09 DIAGNOSIS — Z7901 Long term (current) use of anticoagulants: Secondary | ICD-10-CM | POA: Diagnosis not present

## 2012-04-15 ENCOUNTER — Ambulatory Visit: Payer: Medicare Other | Admitting: Internal Medicine

## 2012-05-08 DIAGNOSIS — Z7901 Long term (current) use of anticoagulants: Secondary | ICD-10-CM | POA: Diagnosis not present

## 2012-05-16 ENCOUNTER — Ambulatory Visit: Payer: Medicare Other | Admitting: Internal Medicine

## 2012-05-23 ENCOUNTER — Encounter: Payer: Self-pay | Admitting: Internal Medicine

## 2012-05-23 ENCOUNTER — Ambulatory Visit (INDEPENDENT_AMBULATORY_CARE_PROVIDER_SITE_OTHER): Payer: Medicare Other | Admitting: Internal Medicine

## 2012-05-23 VITALS — BP 134/80 | HR 86 | Temp 98.1°F | Resp 18 | Wt 122.8 lb

## 2012-05-23 DIAGNOSIS — H6120 Impacted cerumen, unspecified ear: Secondary | ICD-10-CM | POA: Diagnosis not present

## 2012-05-23 DIAGNOSIS — E785 Hyperlipidemia, unspecified: Secondary | ICD-10-CM | POA: Diagnosis not present

## 2012-05-23 DIAGNOSIS — Z9889 Other specified postprocedural states: Secondary | ICD-10-CM

## 2012-05-23 DIAGNOSIS — M1990 Unspecified osteoarthritis, unspecified site: Secondary | ICD-10-CM

## 2012-05-23 DIAGNOSIS — I10 Essential (primary) hypertension: Secondary | ICD-10-CM | POA: Diagnosis not present

## 2012-05-23 DIAGNOSIS — E039 Hypothyroidism, unspecified: Secondary | ICD-10-CM

## 2012-05-23 DIAGNOSIS — H6122 Impacted cerumen, left ear: Secondary | ICD-10-CM

## 2012-05-23 MED ORDER — LOSARTAN POTASSIUM 100 MG PO TABS: 100.0000 mg | ORAL_TABLET | Freq: Every day | ORAL | Status: DC

## 2012-05-23 MED ORDER — LEVOTHYROXINE SODIUM 100 MCG PO TABS: 100.0000 ug | ORAL_TABLET | Freq: Every day | ORAL | Status: DC

## 2012-05-23 MED ORDER — TRAZODONE HCL 100 MG PO TABS: 100.0000 mg | ORAL_TABLET | Freq: Every day | ORAL | Status: DC

## 2012-05-23 MED ORDER — HYDROCODONE-ACETAMINOPHEN 7.5-325 MG PO TABS: 1.0000 | ORAL_TABLET | Freq: Four times a day (QID) | ORAL | Status: DC | PRN

## 2012-05-23 MED ORDER — VERAPAMIL HCL ER 240 MG PO TBCR: 240.0000 mg | EXTENDED_RELEASE_TABLET | Freq: Every day | ORAL | Status: DC

## 2012-05-23 MED ORDER — METHOCARBAMOL 500 MG PO TABS: 500.0000 mg | ORAL_TABLET | Freq: Three times a day (TID) | ORAL | Status: DC | PRN

## 2012-05-23 MED ORDER — BUPROPION HCL ER (SR) 150 MG PO TB12: 150.0000 mg | ORAL_TABLET | Freq: Every day | ORAL | Status: DC

## 2012-05-23 NOTE — Progress Notes (Signed)
Subjective:    Patient ID: Paula Ferguson, female    DOB: 06-23-1940, 72 y.o.   MRN: [id number]  HPI  72 year old patient who is seen today for her biannual followup. She has hypertension and advanced osteoarthritis. She is status post multiple orthopedic procedures. She is status post aortic valve repair and is on chronic Coumadin anticoagulation. She is followed by cardiology. She has coronary artery disease which has been stable. She has hypothyroidism and dyslipidemia.  Past Medical History  Diagnosis Date  . ANEMIA 09/15/2009  . HYPERLIPIDEMIA 02/07/2007  . HYPERTENSION 09/14/2006  . HYPOTHYROIDISM 09/14/2006  . LIVER FUNCTION TESTS, ABNORMAL, HX OF 01/12/2009  . OLECRANON BURSITIS, LEFT 08/26/2007  . Blood transfusion 2000's; 11/17/2011    S/P "one of my other shoulder surgeries"; S/P left shoulder replacement  . CORONARY ARTERY DISEASE 08/26/2007    LOV Dr Katrinka Blazing 1/13 with EKG and clearance on chart  . Pneumonia     hx  . Heart murmur     hx- surgery  . OSTEOARTHRITIS 02/07/2007    "thumbs; knees; shoulders; ?back"  . PTSD (post-traumatic stress disorder) 1976  . Depression     History   Social History  . Marital Status: Widowed    Spouse Name: N/A    Number of Children: N/A  . Years of Education: N/A   Occupational History  . Not on file.   Social History Main Topics  . Smoking status: Never Smoker   . Smokeless tobacco: Never Used  . Alcohol Use: No  . Drug Use: No  . Sexually Active: Not Currently   Other Topics Concern  . Not on file   Social History Narrative  . No narrative on file    Past Surgical History  Procedure Laterality Date  . Joint replacement      right shoulder arthroscopy, left partial knee replacement  . Colonoscopy    . Total knee arthroplasty  03/21/2011    Procedure: TOTAL KNEE ARTHROPLASTY;  Surgeon: Shelda Pal, MD;  Location: [facility];  Service: Orthopedics;  Laterality: Right;  . Total shoulder replacement  2012; 11/17/2011   right; left  . Tonsillectomy and adenoidectomy  ~ 1964  . Appendectomy  1981  . Abdominal hysterectomy  1981  . Cholecystectomy  1990's  . Knee arthroscopy  1980's; 2012    left; right  . Partial knee arthroplasty  1990's    left  . Transfer / transplant ankle tendon superficial / deep  1990's    "bunch of surgeries after dr ruptured posterior tibial tendon", right foot  . Carpal tunnel release  1990's    bilaterally  . Shoulder arthroscopy  2000's    right; "3 surgeries";  Marland Kitchen Shoulder arthroscopy w/ rotator cuff repair  1990's  . Cardiac valve replacement  2003    "aortic"  . Coronary artery bypass graft  2003    CABG X1 "mammary" w/aortic valve replacement   . Cardiac catheterization  2003  . Dilation and curettage of uterus  1981?  Marland Kitchen Fracture surgery    . Exploratory laparotomy  ~ 1981  . Steriod injection  11/17/2011    Procedure: STEROID INJECTION;  Surgeon: Verlee Rossetti, MD;  Location: [facility] OR;  Service: Orthopedics;  Laterality: Right;  cortizone injection right thumb  . Reverse shoulder arthroplasty  11/17/2011    Procedure: REVERSE SHOULDER ARTHROPLASTY;  Surgeon: Verlee Rossetti, MD;  Location: [facility] OR;  Service: Orthopedics;  Laterality: Left;  left reverse shoulder arthroplasty  No family history on file.  Allergies  Allergen Reactions  . Hydromorphone Hcl Itching    "Dilaudid"  . Meperidine Hcl Itching  . Morphine Sulfate Itching    Current Outpatient Prescriptions on File Prior to Visit  Medication Sig Dispense Refill  . BIOTIN PO Take 1 tablet by mouth daily.      Marland Kitchen buPROPion (WELLBUTRIN SR) 150 MG 12 hr tablet Take 150 mg by mouth daily.      . Calcium Citrate-Vitamin D (CITRACAL PETITES/VITAMIN D PO) Take 2 tablets by mouth daily.      . cholecalciferol (VITAMIN D) 1000 UNITS tablet Take 1,000 Units by mouth daily.      . Cyanocobalamin (VITAMIN B-12 PO) Take 1 tablet by mouth daily.      . ferrous sulfate 325 (65 FE) MG tablet Take 325 mg by mouth 2  (two) times daily.      Marland Kitchen levothyroxine (SYNTHROID, LEVOTHROID) 100 MCG tablet Take 1 tablet (100 mcg total) by mouth daily.  90 tablet  5  . methocarbamol (ROBAXIN) 500 MG tablet Take 1 tablet (500 mg total) by mouth 3 (three) times daily as needed.  60 tablet  1  . Multiple Vitamins-Minerals (CENTRUM PO) Take 1 tablet by mouth daily.      . Omega-3 Fatty Acids (FISH OIL PO) Take 1 tablet by mouth daily.      . traZODone (DESYREL) 100 MG tablet Take 100 mg by mouth at bedtime.      . verapamil (CALAN-SR) 240 MG CR tablet Take 240 mg by mouth at bedtime.      Marland Kitchen warfarin (COUMADIN) 5 MG tablet Take 5 mg by mouth daily.      Marland Kitchen losartan (COZAAR) 100 MG tablet Take 100 mg by mouth daily.       No current facility-administered medications on file prior to visit.    BP 134/80  Pulse 86  Temp(Src) 98.1 F (36.7 C) (Oral)  Resp 18  Wt 122 lb 12.8 oz (55.702 kg)  BMI 22.45 kg/m2  SpO2 98%       Review of Systems  Constitutional: Negative.   HENT: Negative for hearing loss, congestion, sore throat, rhinorrhea, dental problem, sinus pressure and tinnitus.   Eyes: Negative for pain, discharge and visual disturbance.  Respiratory: Negative for cough and shortness of breath.   Cardiovascular: Negative for chest pain, palpitations and leg swelling.  Gastrointestinal: Negative for nausea, vomiting, abdominal pain, diarrhea, constipation, blood in stool and abdominal distention.  Genitourinary: Negative for dysuria, urgency, frequency, hematuria, flank pain, vaginal bleeding, vaginal discharge, difficulty urinating, vaginal pain and pelvic pain.  Musculoskeletal: Positive for back pain, joint swelling and arthralgias. Negative for gait problem.  Skin: Negative for rash.  Neurological: Negative for dizziness, syncope, speech difficulty, weakness, numbness and headaches.  Hematological: Negative for adenopathy.  Psychiatric/Behavioral: Negative for behavioral problems, dysphoric mood and  agitation. The patient is not nervous/anxious.        Objective:   Physical Exam  Constitutional: She is oriented to person, place, and time. She appears well-developed and well-nourished.  HENT:  Head: Normocephalic.  Right Ear: External ear normal.  Left Ear: External ear normal.  Mouth/Throat: Oropharynx is clear and moist.  Left cerumen impaction  Eyes: Conjunctivae and EOM are normal. Pupils are equal, round, and reactive to light.  Neck: Normal range of motion. Neck supple. No thyromegaly present.  Cardiovascular: Normal rate, regular rhythm, normal heart sounds and intact distal pulses.   Pulmonary/Chest: Effort normal and  breath sounds normal.  Abdominal: Soft. Bowel sounds are normal. She exhibits no mass. There is no tenderness.  Musculoskeletal: Normal range of motion.  Marked osteoarthritic joint changes  Lymphadenopathy:    She has no cervical adenopathy.  Neurological: She is alert and oriented to person, place, and time.  Skin: Skin is warm and dry. No rash noted.  Psychiatric: She has a normal mood and affect. Her behavior is normal.          Assessment & Plan:  Status post aortic valve repair. Continue indefinite Coumadin anticoagulation. Follow cardiology Hypertension stable Osteoarthritis. Hydrocodone refilled Cerumen impaction. Will irrigate until clear  Recheck 6 months for annual exam Medications refilled

## 2012-05-23 NOTE — Patient Instructions (Signed)
Limit your sodium (Salt) intake    It is important that you exercise regularly, at least 20 minutes 3 to 4 times per week.  If you develop chest pain or shortness of breath seek  medical attention.  Return in 6 months for follow-up  

## 2012-06-03 DIAGNOSIS — M79609 Pain in unspecified limb: Secondary | ICD-10-CM | POA: Diagnosis not present

## 2012-06-05 DIAGNOSIS — Z954 Presence of other heart-valve replacement: Secondary | ICD-10-CM | POA: Diagnosis not present

## 2012-06-05 DIAGNOSIS — Z7901 Long term (current) use of anticoagulants: Secondary | ICD-10-CM | POA: Diagnosis not present

## 2012-07-03 DIAGNOSIS — Z7901 Long term (current) use of anticoagulants: Secondary | ICD-10-CM | POA: Diagnosis not present

## 2012-07-03 DIAGNOSIS — Z954 Presence of other heart-valve replacement: Secondary | ICD-10-CM | POA: Diagnosis not present

## 2012-07-09 ENCOUNTER — Telehealth: Payer: Self-pay | Admitting: Internal Medicine

## 2012-07-09 NOTE — Telephone Encounter (Signed)
Pt's prior auth for METHOCARBAMOL has been denied, due to class of med and pt's age. Please advise.

## 2012-07-10 NOTE — Telephone Encounter (Signed)
D/c med

## 2012-07-10 NOTE — Telephone Encounter (Signed)
Spoke to pt told her medication Methocarbamol was denied by insurance they will not cover it, so she can stop medication per Dr. Amador Cunas. Pt verbalized understanding.

## 2012-07-11 DIAGNOSIS — M25519 Pain in unspecified shoulder: Secondary | ICD-10-CM | POA: Diagnosis not present

## 2012-08-01 DIAGNOSIS — Z7901 Long term (current) use of anticoagulants: Secondary | ICD-10-CM | POA: Diagnosis not present

## 2012-08-01 DIAGNOSIS — Z954 Presence of other heart-valve replacement: Secondary | ICD-10-CM | POA: Diagnosis not present

## 2012-08-05 DIAGNOSIS — Z7901 Long term (current) use of anticoagulants: Secondary | ICD-10-CM | POA: Diagnosis not present

## 2012-08-05 DIAGNOSIS — Z954 Presence of other heart-valve replacement: Secondary | ICD-10-CM | POA: Diagnosis not present

## 2012-08-20 DIAGNOSIS — Z7901 Long term (current) use of anticoagulants: Secondary | ICD-10-CM | POA: Diagnosis not present

## 2012-08-20 DIAGNOSIS — Z954 Presence of other heart-valve replacement: Secondary | ICD-10-CM | POA: Diagnosis not present

## 2012-09-17 DIAGNOSIS — H35039 Hypertensive retinopathy, unspecified eye: Secondary | ICD-10-CM | POA: Diagnosis not present

## 2012-09-17 DIAGNOSIS — H524 Presbyopia: Secondary | ICD-10-CM | POA: Diagnosis not present

## 2012-09-17 DIAGNOSIS — Z954 Presence of other heart-valve replacement: Secondary | ICD-10-CM | POA: Diagnosis not present

## 2012-09-17 DIAGNOSIS — Z7901 Long term (current) use of anticoagulants: Secondary | ICD-10-CM | POA: Diagnosis not present

## 2012-09-17 DIAGNOSIS — H2510 Age-related nuclear cataract, unspecified eye: Secondary | ICD-10-CM | POA: Diagnosis not present

## 2012-09-17 DIAGNOSIS — H5210 Myopia, unspecified eye: Secondary | ICD-10-CM | POA: Diagnosis not present

## 2012-10-07 ENCOUNTER — Other Ambulatory Visit: Payer: Self-pay | Admitting: Internal Medicine

## 2012-10-17 DIAGNOSIS — I2510 Atherosclerotic heart disease of native coronary artery without angina pectoris: Secondary | ICD-10-CM | POA: Diagnosis not present

## 2012-10-17 DIAGNOSIS — Z954 Presence of other heart-valve replacement: Secondary | ICD-10-CM | POA: Diagnosis not present

## 2012-10-17 DIAGNOSIS — Z7901 Long term (current) use of anticoagulants: Secondary | ICD-10-CM | POA: Diagnosis not present

## 2012-10-17 DIAGNOSIS — I10 Essential (primary) hypertension: Secondary | ICD-10-CM | POA: Diagnosis not present

## 2012-10-22 DIAGNOSIS — H18419 Arcus senilis, unspecified eye: Secondary | ICD-10-CM | POA: Diagnosis not present

## 2012-10-22 DIAGNOSIS — H2510 Age-related nuclear cataract, unspecified eye: Secondary | ICD-10-CM | POA: Diagnosis not present

## 2012-10-22 DIAGNOSIS — H02839 Dermatochalasis of unspecified eye, unspecified eyelid: Secondary | ICD-10-CM | POA: Diagnosis not present

## 2012-10-22 DIAGNOSIS — H25019 Cortical age-related cataract, unspecified eye: Secondary | ICD-10-CM | POA: Diagnosis not present

## 2012-10-22 DIAGNOSIS — H25049 Posterior subcapsular polar age-related cataract, unspecified eye: Secondary | ICD-10-CM | POA: Diagnosis not present

## 2012-11-07 DIAGNOSIS — L819 Disorder of pigmentation, unspecified: Secondary | ICD-10-CM | POA: Diagnosis not present

## 2012-11-07 DIAGNOSIS — L905 Scar conditions and fibrosis of skin: Secondary | ICD-10-CM | POA: Diagnosis not present

## 2012-11-07 DIAGNOSIS — L570 Actinic keratosis: Secondary | ICD-10-CM | POA: Diagnosis not present

## 2012-11-15 ENCOUNTER — Other Ambulatory Visit: Payer: Self-pay | Admitting: *Deleted

## 2012-11-15 NOTE — Telephone Encounter (Signed)
Received fax from pharmacy for refill for Hydrocodone, Rx was already filled by Dr. Durwin Nora.

## 2012-11-22 ENCOUNTER — Ambulatory Visit (INDEPENDENT_AMBULATORY_CARE_PROVIDER_SITE_OTHER): Payer: Medicare Other | Admitting: Pharmacist

## 2012-11-22 ENCOUNTER — Encounter: Payer: Medicare Other | Admitting: Internal Medicine

## 2012-11-22 DIAGNOSIS — Z952 Presence of prosthetic heart valve: Secondary | ICD-10-CM | POA: Insufficient documentation

## 2012-11-22 DIAGNOSIS — Z954 Presence of other heart-valve replacement: Secondary | ICD-10-CM | POA: Diagnosis not present

## 2012-11-22 DIAGNOSIS — I359 Nonrheumatic aortic valve disorder, unspecified: Secondary | ICD-10-CM | POA: Diagnosis not present

## 2012-11-22 LAB — POCT INR: INR: 1.6

## 2012-11-25 DIAGNOSIS — M899 Disorder of bone, unspecified: Secondary | ICD-10-CM | POA: Diagnosis not present

## 2012-11-25 DIAGNOSIS — Z1231 Encounter for screening mammogram for malignant neoplasm of breast: Secondary | ICD-10-CM | POA: Diagnosis not present

## 2012-11-25 LAB — HM MAMMOGRAPHY

## 2012-11-26 ENCOUNTER — Encounter: Payer: Self-pay | Admitting: Internal Medicine

## 2012-12-09 ENCOUNTER — Ambulatory Visit (INDEPENDENT_AMBULATORY_CARE_PROVIDER_SITE_OTHER): Payer: Medicare Other | Admitting: Pharmacist

## 2012-12-09 DIAGNOSIS — Z954 Presence of other heart-valve replacement: Secondary | ICD-10-CM

## 2012-12-09 DIAGNOSIS — I359 Nonrheumatic aortic valve disorder, unspecified: Secondary | ICD-10-CM | POA: Diagnosis not present

## 2012-12-09 LAB — POCT INR: INR: 2

## 2012-12-10 NOTE — Progress Notes (Signed)
F/u date put in.  Thanks.

## 2012-12-12 DIAGNOSIS — Z96659 Presence of unspecified artificial knee joint: Secondary | ICD-10-CM | POA: Diagnosis not present

## 2012-12-12 DIAGNOSIS — M6580 Other synovitis and tenosynovitis, unspecified site: Secondary | ICD-10-CM | POA: Diagnosis not present

## 2012-12-13 ENCOUNTER — Encounter: Payer: Self-pay | Admitting: Internal Medicine

## 2012-12-13 ENCOUNTER — Other Ambulatory Visit: Payer: Self-pay | Admitting: Internal Medicine

## 2012-12-16 DIAGNOSIS — Z23 Encounter for immunization: Secondary | ICD-10-CM | POA: Diagnosis not present

## 2012-12-30 ENCOUNTER — Encounter: Payer: Self-pay | Admitting: Interventional Cardiology

## 2013-01-09 ENCOUNTER — Ambulatory Visit (INDEPENDENT_AMBULATORY_CARE_PROVIDER_SITE_OTHER): Payer: Medicare Other | Admitting: Internal Medicine

## 2013-01-09 ENCOUNTER — Encounter: Payer: Self-pay | Admitting: Internal Medicine

## 2013-01-09 ENCOUNTER — Ambulatory Visit (INDEPENDENT_AMBULATORY_CARE_PROVIDER_SITE_OTHER): Payer: Medicare Other | Admitting: *Deleted

## 2013-01-09 VITALS — BP 122/80 | HR 88 | Temp 97.9°F | Resp 20 | Wt 122.8 lb

## 2013-01-09 DIAGNOSIS — I10 Essential (primary) hypertension: Secondary | ICD-10-CM

## 2013-01-09 DIAGNOSIS — M1990 Unspecified osteoarthritis, unspecified site: Secondary | ICD-10-CM

## 2013-01-09 DIAGNOSIS — Z954 Presence of other heart-valve replacement: Secondary | ICD-10-CM | POA: Diagnosis not present

## 2013-01-09 DIAGNOSIS — Z9889 Other specified postprocedural states: Secondary | ICD-10-CM | POA: Diagnosis not present

## 2013-01-09 DIAGNOSIS — I359 Nonrheumatic aortic valve disorder, unspecified: Secondary | ICD-10-CM | POA: Diagnosis not present

## 2013-01-09 MED ORDER — HYDROCODONE-ACETAMINOPHEN 7.5-325 MG PO TABS: 1.0000 | ORAL_TABLET | Freq: Four times a day (QID) | ORAL | Status: DC | PRN

## 2013-01-09 NOTE — Patient Instructions (Signed)
Limit your sodium (Salt) intake    It is important that you exercise regularly, at least 20 minutes 3 to 4 times per week.  If you develop chest pain or shortness of breath seek  medical attention.  Return in 6 months for follow-up  

## 2013-01-09 NOTE — Progress Notes (Signed)
Subjective:    Patient ID: Paula Ferguson, female    DOB: October 20, 1940, 72 y.o.   MRN: [id number]  HPI Pre-visit discussion using our clinic review tool. No additional management support is needed unless otherwise documented below in the visit note.  72 year old patient who is seen today for her biannual followup. She has had a recent followup bone density study that was stable. She has CAD and status post aVR repair on chronic Coumadin anticoagulation. She is doing quite well. She has advanced osteoarthritis and has had multiple orthopedic procedures. She remains on oxycodone. In general doing well except for her arthritic discomfort.  Past Medical History  Diagnosis Date  . ANEMIA 09/15/2009  . HYPERLIPIDEMIA 02/07/2007  . HYPERTENSION 09/14/2006  . HYPOTHYROIDISM 09/14/2006  . LIVER FUNCTION TESTS, ABNORMAL, HX OF 01/12/2009  . OLECRANON BURSITIS, LEFT 08/26/2007  . Blood transfusion 2000's; 11/17/2011    S/P "one of my other shoulder surgeries"; S/P left shoulder replacement  . CORONARY ARTERY DISEASE 08/26/2007    LOV Dr Katrinka Blazing 1/13 with EKG and clearance on chart  . Pneumonia     hx  . Heart murmur     hx- surgery  . OSTEOARTHRITIS 02/07/2007    "thumbs; knees; shoulders; ?back"  . PTSD (post-traumatic stress disorder) 1976  . Depression     History   Social History  . Marital Status: Widowed    Spouse Name: N/A    Number of Children: N/A  . Years of Education: N/A   Occupational History  . Not on file.   Social History Main Topics  . Smoking status: Never Smoker   . Smokeless tobacco: Never Used  . Alcohol Use: No  . Drug Use: No  . Sexual Activity: Not Currently   Other Topics Concern  . Not on file   Social History Narrative  . No narrative on file    Past Surgical History  Procedure Laterality Date  . Joint replacement      right shoulder arthroscopy, left partial knee replacement  . Colonoscopy    . Total knee arthroplasty  03/21/2011    Procedure: TOTAL  KNEE ARTHROPLASTY;  Surgeon: Shelda Pal, MD;  Location: [facility];  Service: Orthopedics;  Laterality: Right;  . Total shoulder replacement  2012; 11/17/2011    right; left  . Tonsillectomy and adenoidectomy  ~ 1964  . Appendectomy  1981  . Abdominal hysterectomy  1981  . Cholecystectomy  1990's  . Knee arthroscopy  1980's; 2012    left; right  . Partial knee arthroplasty  1990's    left  . Transfer / transplant ankle tendon superficial / deep  1990's    "bunch of surgeries after dr ruptured posterior tibial tendon", right foot  . Carpal tunnel release  1990's    bilaterally  . Shoulder arthroscopy  2000's    right; "3 surgeries";  Marland Kitchen Shoulder arthroscopy w/ rotator cuff repair  1990's  . Cardiac valve replacement  2003    "aortic"  . Coronary artery bypass graft  2003    CABG X1 "mammary" w/aortic valve replacement   . Cardiac catheterization  2003  . Dilation and curettage of uterus  1981?  Marland Kitchen Fracture surgery    . Exploratory laparotomy  ~ 1981  . Steriod injection  11/17/2011    Procedure: STEROID INJECTION;  Surgeon: Verlee Rossetti, MD;  Location: [facility] OR;  Service: Orthopedics;  Laterality: Right;  cortizone injection right thumb  . Reverse shoulder arthroplasty  11/17/2011  Procedure: REVERSE SHOULDER ARTHROPLASTY;  Surgeon: Verlee Rossetti, MD;  Location: [facility] OR;  Service: Orthopedics;  Laterality: Left;  left reverse shoulder arthroplasty    No family history on file.  Allergies  Allergen Reactions  . Hydromorphone Hcl Itching    "Dilaudid"  . Meperidine Hcl Itching  . Morphine Sulfate Itching    Current Outpatient Prescriptions on File Prior to Visit  Medication Sig Dispense Refill  . BIOTIN PO Take 1 tablet by mouth daily.      Marland Kitchen buPROPion (WELLBUTRIN SR) 150 MG 12 hr tablet Take 1 tablet (150 mg total) by mouth daily.  90 tablet  5  . Calcium Citrate-Vitamin D (CITRACAL PETITES/VITAMIN D PO) Take 2 tablets by mouth daily.      . cholecalciferol (VITAMIN D) 1000  UNITS tablet Take 1,000 Units by mouth daily.      . Cyanocobalamin (VITAMIN B-12 PO) Take 1 tablet by mouth daily.      . ferrous sulfate 325 (65 FE) MG tablet Take 325 mg by mouth daily with breakfast.       . levothyroxine (SYNTHROID, LEVOTHROID) 100 MCG tablet Take 1 tablet (100 mcg total) by mouth daily.  90 tablet  5  . losartan (COZAAR) 100 MG tablet Take 1 tablet (100 mg total) by mouth daily.  90 tablet  6  . methocarbamol (ROBAXIN) 500 MG tablet Take 1 tablet (500 mg total) by mouth 3 (three) times daily as needed.  60 tablet  1  . Multiple Vitamins-Minerals (CENTRUM PO) Take 1 tablet by mouth daily.      . Omega-3 Fatty Acids (FISH OIL PO) Take 1 tablet by mouth daily.      . traZODone (DESYREL) 100 MG tablet Take 1 tablet (100 mg total) by mouth at bedtime.  90 tablet  5  . verapamil (CALAN-SR) 240 MG CR tablet Take 1 tablet (240 mg total) by mouth at bedtime.  90 tablet  4  . warfarin (COUMADIN) 5 MG tablet Take 5 mg by mouth daily.      Marland Kitchen warfarin (COUMADIN) 5 MG tablet TAKE AS DIRECTED  90 tablet  0   No current facility-administered medications on file prior to visit.    BP 122/80  Pulse 88  Temp(Src) 97.9 F (36.6 C) (Oral)  Resp 20  Wt 122 lb 12.8 oz (55.702 kg)  SpO2 97%       Review of Systems  Constitutional: Negative.   HENT: Negative for congestion, dental problem, hearing loss, rhinorrhea, sinus pressure, sore throat and tinnitus.   Eyes: Negative for pain, discharge and visual disturbance.  Respiratory: Negative for cough and shortness of breath.   Cardiovascular: Negative for chest pain, palpitations and leg swelling.       Prosthetic S1-S2 no significant murmur  Gastrointestinal: Negative for nausea, vomiting, abdominal pain, diarrhea, constipation, blood in stool and abdominal distention.  Genitourinary: Negative for dysuria, urgency, frequency, hematuria, flank pain, vaginal bleeding, vaginal discharge, difficulty urinating, vaginal pain and pelvic  pain.  Musculoskeletal: Negative for arthralgias, gait problem and joint swelling.  Skin: Negative for rash.  Neurological: Negative for dizziness, syncope, speech difficulty, weakness, numbness and headaches.  Hematological: Negative for adenopathy.  Psychiatric/Behavioral: Negative for behavioral problems, dysphoric mood and agitation. The patient is not nervous/anxious.        Objective:   Physical Exam  Constitutional: She is oriented to person, place, and time. She appears well-developed and well-nourished.  HENT:  Head: Normocephalic.  Right Ear: External ear normal.  Left Ear: External ear normal.  Mouth/Throat: Oropharynx is clear and moist.  Eyes: Conjunctivae and EOM are normal. Pupils are equal, round, and reactive to light.  Neck: Normal range of motion. Neck supple. No thyromegaly present.  Cardiovascular: Normal rate, regular rhythm and intact distal pulses.   Posterior tibia pulses full. Dorsalis pedis pulses faint Prosthetic S1 and S2 without significant murmur  Pulmonary/Chest: Effort normal and breath sounds normal.  Abdominal: Soft. Bowel sounds are normal. She exhibits no mass. There is no tenderness.  Musculoskeletal: Normal range of motion.  Marked osteoarthritic changes  Lymphadenopathy:    She has no cervical adenopathy.  Neurological: She is alert and oriented to person, place, and time.  Skin: Skin is warm and dry. No rash noted.  Psychiatric: She has a normal mood and affect. Her behavior is normal.          Assessment & Plan:   Status post aVR Chronic Coumadin anticoagulation with goal INR 2.5-3.5 Osteoarthritis CAD Dyslipidemia hypothyroidism  Medicines updated CPX in 6 months Monthly INR

## 2013-01-30 DIAGNOSIS — M6530 Trigger finger, unspecified finger: Secondary | ICD-10-CM | POA: Diagnosis not present

## 2013-02-06 ENCOUNTER — Ambulatory Visit (INDEPENDENT_AMBULATORY_CARE_PROVIDER_SITE_OTHER): Payer: Medicare Other | Admitting: Pharmacist

## 2013-02-06 DIAGNOSIS — I359 Nonrheumatic aortic valve disorder, unspecified: Secondary | ICD-10-CM

## 2013-02-06 DIAGNOSIS — Z954 Presence of other heart-valve replacement: Secondary | ICD-10-CM

## 2013-02-24 ENCOUNTER — Other Ambulatory Visit: Payer: Self-pay | Admitting: Internal Medicine

## 2013-02-24 DIAGNOSIS — M25569 Pain in unspecified knee: Secondary | ICD-10-CM | POA: Diagnosis not present

## 2013-02-25 ENCOUNTER — Encounter: Payer: Medicare Other | Admitting: Internal Medicine

## 2013-03-03 DIAGNOSIS — M25569 Pain in unspecified knee: Secondary | ICD-10-CM | POA: Diagnosis not present

## 2013-03-07 ENCOUNTER — Ambulatory Visit (INDEPENDENT_AMBULATORY_CARE_PROVIDER_SITE_OTHER): Payer: Medicare Other

## 2013-03-07 DIAGNOSIS — Z954 Presence of other heart-valve replacement: Secondary | ICD-10-CM

## 2013-03-07 DIAGNOSIS — I359 Nonrheumatic aortic valve disorder, unspecified: Secondary | ICD-10-CM | POA: Diagnosis not present

## 2013-03-07 DIAGNOSIS — M25569 Pain in unspecified knee: Secondary | ICD-10-CM | POA: Diagnosis not present

## 2013-03-07 LAB — POCT INR: INR: 2.3

## 2013-03-10 ENCOUNTER — Telehealth: Payer: Self-pay | Admitting: Internal Medicine

## 2013-03-10 DIAGNOSIS — M25569 Pain in unspecified knee: Secondary | ICD-10-CM | POA: Diagnosis not present

## 2013-03-10 MED ORDER — HYDROCODONE-ACETAMINOPHEN 7.5-325 MG PO TABS: 1.0000 | ORAL_TABLET | Freq: Four times a day (QID) | ORAL | Status: DC | PRN

## 2013-03-10 NOTE — Telephone Encounter (Signed)
Pt notified Rx ready for pickup, Rx printed and signed put at front desk.

## 2013-03-10 NOTE — Telephone Encounter (Signed)
Pt needs new rx hydrocodone °

## 2013-03-21 ENCOUNTER — Other Ambulatory Visit: Payer: Self-pay | Admitting: Internal Medicine

## 2013-03-26 ENCOUNTER — Ambulatory Visit: Payer: Medicare Other | Admitting: Interventional Cardiology

## 2013-04-14 DIAGNOSIS — M25569 Pain in unspecified knee: Secondary | ICD-10-CM | POA: Diagnosis not present

## 2013-04-16 ENCOUNTER — Other Ambulatory Visit (HOSPITAL_COMMUNITY): Payer: Self-pay | Admitting: Orthopedic Surgery

## 2013-04-16 DIAGNOSIS — M25562 Pain in left knee: Principal | ICD-10-CM

## 2013-04-16 DIAGNOSIS — M25561 Pain in right knee: Secondary | ICD-10-CM

## 2013-04-18 ENCOUNTER — Ambulatory Visit: Payer: Medicare Other | Admitting: Interventional Cardiology

## 2013-04-24 ENCOUNTER — Other Ambulatory Visit (HOSPITAL_COMMUNITY): Payer: Self-pay | Admitting: Orthopedic Surgery

## 2013-04-24 ENCOUNTER — Encounter (HOSPITAL_COMMUNITY): Admission: RE | Admit: 2013-04-24 | Payer: Medicare Other | Source: Ambulatory Visit

## 2013-04-24 ENCOUNTER — Encounter (HOSPITAL_COMMUNITY): Payer: Medicare Other

## 2013-04-24 DIAGNOSIS — M25561 Pain in right knee: Secondary | ICD-10-CM

## 2013-04-24 DIAGNOSIS — M25562 Pain in left knee: Principal | ICD-10-CM

## 2013-04-25 DIAGNOSIS — H25019 Cortical age-related cataract, unspecified eye: Secondary | ICD-10-CM | POA: Diagnosis not present

## 2013-04-25 DIAGNOSIS — H25049 Posterior subcapsular polar age-related cataract, unspecified eye: Secondary | ICD-10-CM | POA: Diagnosis not present

## 2013-04-25 DIAGNOSIS — H18419 Arcus senilis, unspecified eye: Secondary | ICD-10-CM | POA: Diagnosis not present

## 2013-04-25 DIAGNOSIS — H02839 Dermatochalasis of unspecified eye, unspecified eyelid: Secondary | ICD-10-CM | POA: Diagnosis not present

## 2013-04-25 DIAGNOSIS — H2510 Age-related nuclear cataract, unspecified eye: Secondary | ICD-10-CM | POA: Diagnosis not present

## 2013-04-30 ENCOUNTER — Encounter (HOSPITAL_COMMUNITY): Payer: Medicare Other

## 2013-04-30 ENCOUNTER — Telehealth: Payer: Self-pay | Admitting: Internal Medicine

## 2013-04-30 NOTE — Telephone Encounter (Signed)
Pt needs new rx hydrocodone °

## 2013-05-01 MED ORDER — HYDROCODONE-ACETAMINOPHEN 7.5-325 MG PO TABS: 1.0000 | ORAL_TABLET | Freq: Four times a day (QID) | ORAL | Status: DC | PRN

## 2013-05-01 NOTE — Telephone Encounter (Signed)
Spoke to pt told her Rx ready for pickup, will be at the front desk. Pt verbalized understanding and stated she may not get it till Monday. Told pt that is fine will be at the front desk. Rx printed and signed.

## 2013-05-05 ENCOUNTER — Ambulatory Visit (INDEPENDENT_AMBULATORY_CARE_PROVIDER_SITE_OTHER): Payer: Medicare Other

## 2013-05-05 DIAGNOSIS — I359 Nonrheumatic aortic valve disorder, unspecified: Secondary | ICD-10-CM

## 2013-05-05 DIAGNOSIS — Z954 Presence of other heart-valve replacement: Secondary | ICD-10-CM | POA: Diagnosis not present

## 2013-05-05 LAB — POCT INR: INR: 3

## 2013-05-06 ENCOUNTER — Encounter (HOSPITAL_COMMUNITY)
Admission: RE | Admit: 2013-05-06 | Discharge: 2013-05-06 | Disposition: A | Discharge status: Home / Self Care | Payer: Medicare Other | Source: Ambulatory Visit | Attending: Orthopedic Surgery | Admitting: Orthopedic Surgery

## 2013-05-06 ENCOUNTER — Ambulatory Visit (HOSPITAL_COMMUNITY)
Admission: RE | Admit: 2013-05-06 | Discharge: 2013-05-06 | Disposition: A | Discharge status: Home / Self Care | Payer: Medicare Other | Source: Ambulatory Visit | Attending: Orthopedic Surgery | Admitting: Orthopedic Surgery

## 2013-05-06 DIAGNOSIS — M1710 Unilateral primary osteoarthritis, unspecified knee: Secondary | ICD-10-CM | POA: Insufficient documentation

## 2013-05-06 DIAGNOSIS — M899 Disorder of bone, unspecified: Secondary | ICD-10-CM | POA: Diagnosis not present

## 2013-05-06 DIAGNOSIS — Z96659 Presence of unspecified artificial knee joint: Secondary | ICD-10-CM | POA: Diagnosis not present

## 2013-05-06 DIAGNOSIS — M25562 Pain in left knee: Secondary | ICD-10-CM

## 2013-05-06 DIAGNOSIS — IMO0002 Reserved for concepts with insufficient information to code with codable children: Secondary | ICD-10-CM | POA: Insufficient documentation

## 2013-05-06 DIAGNOSIS — M949 Disorder of cartilage, unspecified: Secondary | ICD-10-CM | POA: Diagnosis not present

## 2013-05-06 DIAGNOSIS — M25561 Pain in right knee: Secondary | ICD-10-CM

## 2013-05-06 MED ORDER — TECHNETIUM TC 99M MEDRONATE IV KIT
25.0000 | PACK | Freq: Once | INTRAVENOUS | Status: AC | PRN
  Administered 2013-05-06: 25 via INTRAVENOUS

## 2013-05-12 DIAGNOSIS — M19049 Primary osteoarthritis, unspecified hand: Secondary | ICD-10-CM | POA: Diagnosis not present

## 2013-05-12 DIAGNOSIS — M6530 Trigger finger, unspecified finger: Secondary | ICD-10-CM | POA: Diagnosis not present

## 2013-05-13 ENCOUNTER — Ambulatory Visit: Payer: Medicare Other | Admitting: Interventional Cardiology

## 2013-06-05 DIAGNOSIS — Z96659 Presence of unspecified artificial knee joint: Secondary | ICD-10-CM | POA: Diagnosis not present

## 2013-06-11 ENCOUNTER — Ambulatory Visit: Payer: Medicare Other | Admitting: Interventional Cardiology

## 2013-06-16 DIAGNOSIS — H2510 Age-related nuclear cataract, unspecified eye: Secondary | ICD-10-CM | POA: Diagnosis not present

## 2013-06-16 DIAGNOSIS — H269 Unspecified cataract: Secondary | ICD-10-CM | POA: Diagnosis not present

## 2013-06-17 DIAGNOSIS — H2510 Age-related nuclear cataract, unspecified eye: Secondary | ICD-10-CM | POA: Diagnosis not present

## 2013-06-23 ENCOUNTER — Ambulatory Visit (INDEPENDENT_AMBULATORY_CARE_PROVIDER_SITE_OTHER): Payer: Medicare Other | Admitting: *Deleted

## 2013-06-23 DIAGNOSIS — I359 Nonrheumatic aortic valve disorder, unspecified: Secondary | ICD-10-CM | POA: Diagnosis not present

## 2013-06-23 DIAGNOSIS — Z954 Presence of other heart-valve replacement: Secondary | ICD-10-CM

## 2013-06-23 LAB — POCT INR: INR: 2.6

## 2013-06-25 ENCOUNTER — Other Ambulatory Visit: Payer: Self-pay | Admitting: Internal Medicine

## 2013-06-27 ENCOUNTER — Other Ambulatory Visit: Payer: Self-pay | Admitting: *Deleted

## 2013-06-27 ENCOUNTER — Encounter: Payer: Medicare Other | Admitting: Internal Medicine

## 2013-06-27 MED ORDER — WARFARIN SODIUM 5 MG PO TABS: 5.0000 mg | ORAL_TABLET | ORAL | Status: DC

## 2013-07-01 ENCOUNTER — Telehealth: Payer: Self-pay | Admitting: Internal Medicine

## 2013-07-01 MED ORDER — HYDROCODONE-ACETAMINOPHEN 7.5-325 MG PO TABS: 1.0000 | ORAL_TABLET | Freq: Four times a day (QID) | ORAL | Status: DC | PRN

## 2013-07-01 NOTE — Telephone Encounter (Signed)
Pt notified Rx ready for pickup. Rx printed and signed.  

## 2013-07-01 NOTE — Telephone Encounter (Signed)
Pt needs new rx hydrocodone °

## 2013-07-03 ENCOUNTER — Other Ambulatory Visit: Payer: Self-pay | Admitting: Internal Medicine

## 2013-07-07 DIAGNOSIS — H2510 Age-related nuclear cataract, unspecified eye: Secondary | ICD-10-CM | POA: Diagnosis not present

## 2013-07-07 DIAGNOSIS — H269 Unspecified cataract: Secondary | ICD-10-CM | POA: Diagnosis not present

## 2013-07-15 ENCOUNTER — Other Ambulatory Visit: Payer: Self-pay | Admitting: Internal Medicine

## 2013-07-17 ENCOUNTER — Other Ambulatory Visit: Payer: Self-pay | Admitting: Dermatology

## 2013-07-17 DIAGNOSIS — D485 Neoplasm of uncertain behavior of skin: Secondary | ICD-10-CM | POA: Diagnosis not present

## 2013-07-17 DIAGNOSIS — D239 Other benign neoplasm of skin, unspecified: Secondary | ICD-10-CM | POA: Diagnosis not present

## 2013-07-17 DIAGNOSIS — L819 Disorder of pigmentation, unspecified: Secondary | ICD-10-CM | POA: Diagnosis not present

## 2013-07-24 ENCOUNTER — Encounter: Payer: Medicare Other | Admitting: Internal Medicine

## 2013-08-11 ENCOUNTER — Encounter: Payer: Self-pay | Admitting: Interventional Cardiology

## 2013-08-11 ENCOUNTER — Ambulatory Visit (INDEPENDENT_AMBULATORY_CARE_PROVIDER_SITE_OTHER): Payer: Medicare Other

## 2013-08-11 ENCOUNTER — Ambulatory Visit (INDEPENDENT_AMBULATORY_CARE_PROVIDER_SITE_OTHER): Payer: Medicare Other | Admitting: Interventional Cardiology

## 2013-08-11 VITALS — BP 130/80 | HR 74 | Ht 62.0 in | Wt 123.0 lb

## 2013-08-11 DIAGNOSIS — I10 Essential (primary) hypertension: Secondary | ICD-10-CM

## 2013-08-11 DIAGNOSIS — Z5181 Encounter for therapeutic drug level monitoring: Secondary | ICD-10-CM | POA: Diagnosis not present

## 2013-08-11 DIAGNOSIS — Z952 Presence of prosthetic heart valve: Secondary | ICD-10-CM

## 2013-08-11 DIAGNOSIS — Z7901 Long term (current) use of anticoagulants: Secondary | ICD-10-CM

## 2013-08-11 DIAGNOSIS — Z954 Presence of other heart-valve replacement: Secondary | ICD-10-CM | POA: Diagnosis not present

## 2013-08-11 DIAGNOSIS — I2510 Atherosclerotic heart disease of native coronary artery without angina pectoris: Secondary | ICD-10-CM | POA: Diagnosis not present

## 2013-08-11 DIAGNOSIS — I359 Nonrheumatic aortic valve disorder, unspecified: Secondary | ICD-10-CM

## 2013-08-11 LAB — POCT INR: INR: 3

## 2013-08-11 NOTE — Patient Instructions (Signed)
Your physician recommends that you continue on your current medications as directed. Please refer to the Current Medication list given to you today.  Your physician wants you to follow-up in: 1 year. You will receive a reminder letter in the mail two months in advance. If you don't receive a letter, please call our office to schedule the follow-up appointment.  

## 2013-08-11 NOTE — Progress Notes (Signed)
Patient ID: Paula Ferguson, female   DOB: 1941-01-15, 73 y.o.   MRN: [id number]    [address], [place]  [address] Phone: [phone] Fax:  [phone]  Date:  08/11/2013   ID:  Paula Ferguson, DOB 1940/03/05, MRN [id number]  PCP:  Nyoka Cowden, MD   ASSESSMENT:  1. Mechanical aortic valve replacement, clinically stable 2. Coronary artery disease with prior LIMA to LAD, asymptomatic 3. Chronic anticoagulation therapy, no bleeding complications 4. Hypertension  PLAN:  1. no change in the current medical regimen 2. Clinical followup in one year   SUBJECTIVE: Paula Ferguson is a 73 y.o. female who is doing well. She has had serious difficulty with musculoskeletal/arthritic difficulties and multiple recent operations. She also had recent cataract surgery and is still having some vision disturbance. She denies edema. She has not had palpitations or syncope. No chest pain. No blood in her urine or stool and she denies neurological complaints.   Wt Readings from Last 3 Encounters:  08/11/13 123 lb (55.792 kg)  01/09/13 122 lb 12.8 oz (55.702 kg)  05/23/12 122 lb 12.8 oz (55.702 kg)     Past Medical History  Diagnosis Date  . ANEMIA 09/15/2009  . HYPERLIPIDEMIA 02/07/2007  . HYPERTENSION 09/14/2006  . HYPOTHYROIDISM 09/14/2006  . LIVER FUNCTION TESTS, ABNORMAL, HX OF 01/12/2009  . OLECRANON BURSITIS, LEFT 08/26/2007  . Blood transfusion 2000's; 11/17/2011    S/P "one of my other shoulder surgeries"; S/P left shoulder replacement  . CORONARY ARTERY DISEASE 08/26/2007    LOV Dr Tamala Julian 1/13 with EKG and clearance on chart  . Pneumonia     hx  . Heart murmur     hx- surgery  . OSTEOARTHRITIS 02/07/2007    "thumbs; knees; shoulders; ?back"  . PTSD (post-traumatic stress disorder) 1976  . Depression     Current Outpatient Prescriptions  Medication Sig Dispense Refill  . BIOTIN PO Take 1 tablet by mouth daily.      Marland Kitchen buPROPion (WELLBUTRIN  SR) 150 MG 12 hr tablet Take 1 tablet (150 mg total) by mouth daily.  90 tablet  5  . Calcium Citrate-Vitamin D (CITRACAL PETITES/VITAMIN D PO) Take 2 tablets by mouth daily.      . cholecalciferol (VITAMIN D) 1000 UNITS tablet Take 1,000 Units by mouth daily.      . Cyanocobalamin (VITAMIN B-12 PO) Take 1 tablet by mouth daily.      . ferrous sulfate 325 (65 FE) MG tablet Take 325 mg by mouth daily with breakfast.       . HYDROcodone-acetaminophen (NORCO) 7.5-325 MG per tablet Take 1 tablet by mouth every 6 (six) hours as needed.  90 tablet  0  . levothyroxine (SYNTHROID, LEVOTHROID) 100 MCG tablet TAKE 1 TABLET BY MOUTH DAILY.  90 tablet  1  . losartan (COZAAR) 100 MG tablet TAKE 1 TABLET BY MOUTH DAILY.  90 tablet  1  . methocarbamol (ROBAXIN) 500 MG tablet TAKE 1 TABLET BY MOUTH 3 TIMES A DAY AS NEEDED.  60 tablet  2  . Multiple Vitamins-Minerals (CENTRUM PO) Take 1 tablet by mouth daily.      . Omega-3 Fatty Acids (FISH OIL PO) Take 1 tablet by mouth daily.      . traZODone (DESYREL) 100 MG tablet Take 1 tablet (100 mg total) by mouth at bedtime.  90 tablet  5  . verapamil (CALAN-SR) 240 MG CR tablet Take 1 tablet (240 mg total) by mouth  at bedtime.  90 tablet  4  . warfarin (COUMADIN) 5 MG tablet Take 1 tablet (5 mg total) by mouth as directed.  90 tablet  0   No current facility-administered medications for this visit.    Allergies:    Allergies  Allergen Reactions  . Hydromorphone Hcl Itching    "Dilaudid"  . Meperidine Hcl Itching  . Morphine Sulfate Itching    Social History:  The patient  reports that she has never smoked. She has never used smokeless tobacco. She reports that she does not drink alcohol or use illicit drugs.   ROS:  Please see the history of present illness.   No blood in the stool or urine. No peripheral edema. Denies chills and fever.   All other systems reviewed and negative.   OBJECTIVE: VS:  BP 130/80  Pulse 74  Ht 5\' 2"  (1.575 m)  Wt 123 lb (55.792  kg)  BMI 22.49 kg/m2 Well nourished, well developed, in no acute distress, although than stated age 55: normal Neck: JVD flat. Carotid bruit absent  Cardiac:  normal S1, S2; RRR; faint 1/6 systolic murmur. Crisp valve closure sounds of mechanical nature. Lungs:  clear to auscultation bilaterally, no wheezing, rhonchi or rales Abd: soft, nontender, no hepatomegaly Ext: Edema absent. Pulses 2+ and symmetric Skin: warm and dry Neuro:  CNs 2-12 intact, no focal abnormalities noted  EKG:  Normal sinus rhythm with left anterior hemiblock       Signed, Illene Labrador III, MD 08/11/2013 1:00 PM

## 2013-08-14 ENCOUNTER — Other Ambulatory Visit: Payer: Self-pay | Admitting: Internal Medicine

## 2013-08-25 ENCOUNTER — Telehealth: Payer: Self-pay | Admitting: Internal Medicine

## 2013-08-25 NOTE — Telephone Encounter (Signed)
Pt rx on HYDROcodone-acetaminophen (NORCO) 7.5-325 MG per tablet

## 2013-08-26 MED ORDER — HYDROCODONE-ACETAMINOPHEN 7.5-325 MG PO TABS: 1.0000 | ORAL_TABLET | Freq: Four times a day (QID) | ORAL | Status: DC | PRN

## 2013-08-26 NOTE — Telephone Encounter (Signed)
Pt notified Rx ready for pickup. Rx printed and signed.  

## 2013-09-24 DIAGNOSIS — M19049 Primary osteoarthritis, unspecified hand: Secondary | ICD-10-CM | POA: Diagnosis not present

## 2013-09-24 DIAGNOSIS — M6530 Trigger finger, unspecified finger: Secondary | ICD-10-CM | POA: Diagnosis not present

## 2013-10-01 ENCOUNTER — Other Ambulatory Visit: Payer: Self-pay | Admitting: Interventional Cardiology

## 2013-10-09 ENCOUNTER — Ambulatory Visit (INDEPENDENT_AMBULATORY_CARE_PROVIDER_SITE_OTHER): Payer: Medicare Other | Admitting: Internal Medicine

## 2013-10-09 ENCOUNTER — Encounter: Payer: Self-pay | Admitting: Internal Medicine

## 2013-10-09 VITALS — BP 112/70 | HR 82 | Temp 97.5°F | Resp 18 | Ht 62.5 in | Wt 125.0 lb

## 2013-10-09 DIAGNOSIS — E039 Hypothyroidism, unspecified: Secondary | ICD-10-CM

## 2013-10-09 DIAGNOSIS — Z0000 Encounter for general adult medical examination without abnormal findings: Secondary | ICD-10-CM | POA: Diagnosis not present

## 2013-10-09 DIAGNOSIS — I2510 Atherosclerotic heart disease of native coronary artery without angina pectoris: Secondary | ICD-10-CM

## 2013-10-09 DIAGNOSIS — Z23 Encounter for immunization: Secondary | ICD-10-CM | POA: Diagnosis not present

## 2013-10-09 DIAGNOSIS — E785 Hyperlipidemia, unspecified: Secondary | ICD-10-CM

## 2013-10-09 DIAGNOSIS — M1990 Unspecified osteoarthritis, unspecified site: Secondary | ICD-10-CM

## 2013-10-09 DIAGNOSIS — Z7901 Long term (current) use of anticoagulants: Secondary | ICD-10-CM

## 2013-10-09 DIAGNOSIS — D649 Anemia, unspecified: Secondary | ICD-10-CM

## 2013-10-09 DIAGNOSIS — Z952 Presence of prosthetic heart valve: Secondary | ICD-10-CM

## 2013-10-09 DIAGNOSIS — I10 Essential (primary) hypertension: Secondary | ICD-10-CM | POA: Diagnosis not present

## 2013-10-09 DIAGNOSIS — Z954 Presence of other heart-valve replacement: Secondary | ICD-10-CM

## 2013-10-09 DIAGNOSIS — Z5181 Encounter for therapeutic drug level monitoring: Secondary | ICD-10-CM

## 2013-10-09 LAB — POCT URINALYSIS DIPSTICK
Bilirubin, UA: NEGATIVE
Glucose, UA: NEGATIVE
Ketones, UA: NEGATIVE
Leukocytes, UA: NEGATIVE
NITRITE UA: NEGATIVE
PH UA: 6
Spec Grav, UA: 1.01
Urobilinogen, UA: 0.2

## 2013-10-09 LAB — CBC WITH DIFFERENTIAL/PLATELET
BASOS ABS: 0 10*3/uL (ref 0.0–0.1)
Basophils Relative: 0.8 % (ref 0.0–3.0)
EOS ABS: 0.1 10*3/uL (ref 0.0–0.7)
Eosinophils Relative: 3.7 % (ref 0.0–5.0)
HEMATOCRIT: 38.5 % (ref 36.0–46.0)
Hemoglobin: 12.8 g/dL (ref 12.0–15.0)
LYMPHS ABS: 0.9 10*3/uL (ref 0.7–4.0)
Lymphocytes Relative: 23.2 % (ref 12.0–46.0)
MCHC: 33.3 g/dL (ref 30.0–36.0)
MCV: 91.6 fl (ref 78.0–100.0)
Monocytes Absolute: 0.4 10*3/uL (ref 0.1–1.0)
Monocytes Relative: 11.1 % (ref 3.0–12.0)
Neutro Abs: 2.3 10*3/uL (ref 1.4–7.7)
Neutrophils Relative %: 61.2 % (ref 43.0–77.0)
PLATELETS: 193 10*3/uL (ref 150.0–400.0)
RBC: 4.21 Mil/uL (ref 3.87–5.11)
RDW: 13.8 % (ref 11.5–15.5)
WBC: 3.8 10*3/uL — ABNORMAL LOW (ref 4.0–10.5)

## 2013-10-09 LAB — COMPREHENSIVE METABOLIC PANEL
ALBUMIN: 3.7 g/dL (ref 3.5–5.2)
ALT: 25 U/L (ref 0–35)
AST: 47 U/L — ABNORMAL HIGH (ref 0–37)
Alkaline Phosphatase: 107 U/L (ref 39–117)
BUN: 14 mg/dL (ref 6–23)
CHLORIDE: 101 meq/L (ref 96–112)
CO2: 30 mEq/L (ref 19–32)
Calcium: 9.6 mg/dL (ref 8.4–10.5)
Creatinine, Ser: 0.9 mg/dL (ref 0.4–1.2)
GFR: 66.26 mL/min (ref >60.00)
Glucose, Bld: 78 mg/dL (ref 70–99)
POTASSIUM: 3.9 meq/L (ref 3.5–5.1)
Sodium: 137 mEq/L (ref 135–145)
TOTAL PROTEIN: 7.5 g/dL (ref 6.0–8.3)
Total Bilirubin: 0.9 mg/dL (ref 0.2–1.2)

## 2013-10-09 LAB — LIPID PANEL
CHOLESTEROL: 167 mg/dL (ref 0–200)
HDL: 45.1 mg/dL (ref >39.00)
LDL Cholesterol: 102 mg/dL — ABNORMAL HIGH (ref 0–99)
NonHDL: 121.9
Total CHOL/HDL Ratio: 4
Triglycerides: 98 mg/dL (ref 0.0–149.0)
VLDL: 19.6 mg/dL (ref 0.0–40.0)

## 2013-10-09 LAB — TSH: TSH: 0.74 u[IU]/mL (ref 0.35–4.50)

## 2013-10-09 MED ORDER — BUPROPION HCL ER (SR) 150 MG PO TB12: 150.0000 mg | ORAL_TABLET | Freq: Every day | ORAL | Status: DC

## 2013-10-09 MED ORDER — HYDROCODONE-ACETAMINOPHEN 7.5-325 MG PO TABS: 1.0000 | ORAL_TABLET | Freq: Four times a day (QID) | ORAL | Status: DC | PRN

## 2013-10-09 MED ORDER — TRAZODONE HCL 100 MG PO TABS: ORAL_TABLET | ORAL | Status: DC

## 2013-10-09 NOTE — Progress Notes (Signed)
Pre visit review using our clinic review tool, if applicable. No additional management support is needed unless otherwise documented below in the visit note. 

## 2013-10-09 NOTE — Progress Notes (Signed)
Subjective:    Patient ID: Paula Ferguson, female    DOB: 1940/08/16, 73 y.o.   MRN: [id number]  HPI  73 year old patient who is seen today for a preventive health examination  She has had a long history of osteoarthritis and has had multiple orthopedic procedures.  This includes bilateral shoulder and knee surgeries, as well as multiple surgeries involving her right foot.  Most recently, she has had bilateral cataract extraction, surgery, and has been quite pleased She is status post aVR repair with mechanical valve in 2003.  She remains on chronic Coumadin anticoagulation and is followed by cardiology at least annually Stable medical problems include hypothyroidism, dyslipidemia, and hypertension Within the past year.  She has had a mammogram bone density study as well as a bone scan.  She states, that she'll be traveling to [place] next month and plans on a gynecologic evaluation.  At that time  Allergies:  1) Demerol (Meperidine Hcl)  2) Dilaudid  3) Morphine Sulfate (Morphine Sulfate)   Past History:  Past Medical History:   Hypertension  Hypothyroidism  Hyperlipidemia  status post mechanical aortic valve repair  history of anxiety, depression  Osteoarthritis  Coronary artery disease  hyperparathyroidism  postoperative anemia  Past Medical History  Diagnosis Date  . ANEMIA 09/15/2009  . HYPERLIPIDEMIA 02/07/2007  . HYPERTENSION 09/14/2006  . HYPOTHYROIDISM 09/14/2006  . LIVER FUNCTION TESTS, ABNORMAL, HX OF 01/12/2009  . OLECRANON BURSITIS, LEFT 08/26/2007  . Blood transfusion 2000's; 11/17/2011    S/P "one of my other shoulder surgeries"; S/P left shoulder replacement  . CORONARY ARTERY DISEASE 08/26/2007    LOV Dr Tamala Julian 1/13 with EKG and clearance on chart  . Pneumonia     hx  . Heart murmur     hx- surgery  . OSTEOARTHRITIS 02/07/2007    "thumbs; knees; shoulders; ?back"  . PTSD (post-traumatic stress disorder) 1976  . Depression     History   Social  History  . Marital Status: Widowed    Spouse Name: N/A    Number of Children: N/A  . Years of Education: N/A   Occupational History  . Not on file.   Social History Main Topics  . Smoking status: Never Smoker   . Smokeless tobacco: Never Used  . Alcohol Use: No  . Drug Use: No  . Sexual Activity: Not Currently   Other Topics Concern  . Not on file   Social History Narrative  . No narrative on file    Past Surgical History  Procedure Laterality Date  . Joint replacement      right shoulder arthroscopy, left partial knee replacement  . Colonoscopy    . Total knee arthroplasty  03/21/2011    Procedure: TOTAL KNEE ARTHROPLASTY;  Surgeon: Mauri Pole, MD;  Location: [facility];  Service: Orthopedics;  Laterality: Right;  . Total shoulder replacement  2012; 11/17/2011    right; left  . Tonsillectomy and adenoidectomy  ~ 1964  . Appendectomy  1981  . Abdominal hysterectomy  1981  . Cholecystectomy  1990's  . Knee arthroscopy  1980's; 2012    left; right  . Partial knee arthroplasty  1990's    left  . Transfer / transplant ankle tendon superficial / deep  1990's    "bunch of surgeries after dr ruptured posterior tibial tendon", right foot  . Carpal tunnel release  1990's    bilaterally  . Shoulder arthroscopy  2000's    right; "3 surgeries";  Marland Kitchen Shoulder arthroscopy  w/ rotator cuff repair  1990's  . Cardiac valve replacement  2003    "aortic"  . Coronary artery bypass graft  2003    CABG X1 "mammary" w/aortic valve replacement   . Cardiac catheterization  2003  . Dilation and curettage of uterus  1981?  Marland Kitchen Fracture surgery    . Exploratory laparotomy  ~ 1981  . Steriod injection  11/17/2011    Procedure: STEROID INJECTION;  Surgeon: Augustin Schooling, MD;  Location: [facility];  Service: Orthopedics;  Laterality: Right;  cortizone injection right thumb  . Reverse shoulder arthroplasty  11/17/2011    Procedure: REVERSE SHOULDER ARTHROPLASTY;  Surgeon: Augustin Schooling, MD;   Location: [facility];  Service: Orthopedics;  Laterality: Left;  left reverse shoulder arthroplasty  . V43.3      heart valve    No family history on file.  Allergies  Allergen Reactions  . Hydromorphone Hcl Itching    "Dilaudid"  . Meperidine Hcl Itching  . Morphine Sulfate Itching    Current Outpatient Prescriptions on File Prior to Visit  Medication Sig Dispense Refill  . BIOTIN PO Take 1 tablet by mouth daily.      . Calcium Citrate-Vitamin D (CITRACAL PETITES/VITAMIN D PO) Take 2 tablets by mouth daily.      . cholecalciferol (VITAMIN D) 1000 UNITS tablet Take 1,000 Units by mouth daily.      . Cyanocobalamin (VITAMIN B-12 PO) Take 1 tablet by mouth daily.      . ferrous sulfate 325 (65 FE) MG tablet Take 325 mg by mouth daily with breakfast.       . levothyroxine (SYNTHROID, LEVOTHROID) 100 MCG tablet TAKE 1 TABLET BY MOUTH DAILY.  90 tablet  1  . losartan (COZAAR) 100 MG tablet TAKE 1 TABLET BY MOUTH DAILY.  90 tablet  1  . methocarbamol (ROBAXIN) 500 MG tablet TAKE 1 TABLET BY MOUTH 3 TIMES A DAY AS NEEDED.  60 tablet  2  . Multiple Vitamins-Minerals (CENTRUM PO) Take 1 tablet by mouth daily.      . Omega-3 Fatty Acids (FISH OIL PO) Take 1 tablet by mouth daily.      . verapamil (CALAN-SR) 240 MG CR tablet TAKE 1 TABLET BY MOUTH AT BEDTIME.  90 tablet  0  . warfarin (COUMADIN) 5 MG tablet TAKE 1 TABLET (5 MG TOTAL) BY MOUTH AS DIRECTED.  90 tablet  0   No current facility-administered medications on file prior to visit.    BP 112/70  Pulse 82  Temp(Src) 97.5 F (36.4 C) (Oral)  Resp 18  Ht 5' 2.5" (1.588 m)  Wt 125 lb (56.7 kg)  BMI 22.48 kg/m2  SpO2 97%  1. Risk factors, based on past  M,S,F history.  Patient has known stable coronary artery disease.  Cardiovascular risk factors include hypertension and dyslipidemia.  She is status post mechanical aVR  2.  Physical activities: There is secondary due to 2 arthritic condition  3.  Depression/mood: No recent depression  or mood disorder  4.  Hearing: No deficits  5.  ADL's: Independent  6.  Fall risk: Moderate due to arthritic condition  7.  Home safety: No problems identified  8.  Height weight, and visual acuity; height and weight stable.  Status post recent cataract extraction, surgery  9.  Counseling: Heart healthy diet, regular exercise encouraged 10. Lab orders based on risk factors: Laboratory profile reviewed  11. Referral : Followup cardiology  12. Care plan: Heart healthy  diet.  The patient will consider followup colonoscopy  13. Cognitive assessment: Alert and oriented.  Normal affect.  No cognitive dysfunction  14. Screening: Followup mammogram in the fall.  Consider followup bone density study in 2 years consider followup colonoscopy  15. Provider List Update: Cardiology and orthopedic followup.  Ophthalmology followup    Review of Systems  Constitutional: Negative for fever, appetite change, fatigue and unexpected weight change.  HENT: Negative for congestion, dental problem, ear pain, hearing loss, mouth sores, nosebleeds, sinus pressure, sore throat, tinnitus, trouble swallowing and voice change.   Eyes: Negative for photophobia, pain, redness and visual disturbance.  Respiratory: Negative for cough, chest tightness and shortness of breath.   Cardiovascular: Negative for chest pain, palpitations and leg swelling.  Gastrointestinal: Negative for nausea, vomiting, abdominal pain, diarrhea, constipation, blood in stool, abdominal distention and rectal pain.  Genitourinary: Negative for dysuria, urgency, frequency, hematuria, flank pain, vaginal bleeding, vaginal discharge, difficulty urinating, genital sores, vaginal pain, menstrual problem and pelvic pain.  Musculoskeletal: Positive for arthralgias, back pain, gait problem and joint swelling. Negative for neck stiffness.  Skin: Negative for rash.  Neurological: Negative for dizziness, syncope, speech difficulty, weakness,  light-headedness, numbness and headaches.  Hematological: Negative for adenopathy. Does not bruise/bleed easily.  Psychiatric/Behavioral: Negative for suicidal ideas, behavioral problems, self-injury, dysphoric mood and agitation. The patient is not nervous/anxious.        Objective:   Physical Exam  Constitutional: She is oriented to person, place, and time. She appears well-developed and well-nourished.  HENT:  Head: Normocephalic and atraumatic.  Right Ear: External ear normal.  Left Ear: External ear normal.  Mouth/Throat: Oropharynx is clear and moist.  Eyes: Conjunctivae and EOM are normal.  Neck: Normal range of motion. Neck supple. No JVD present. No thyromegaly present.  Cardiovascular: Normal rate, regular rhythm, normal heart sounds and intact distal pulses.   No murmur heard. Pulmonary/Chest: Effort normal and breath sounds normal. She has no wheezes. She has no rales.  Abdominal: Soft. Bowel sounds are normal. She exhibits no distension and no mass. There is no tenderness. There is no rebound and no guarding.  Genitourinary: Vagina normal.  Musculoskeletal: Normal range of motion. She exhibits no edema and no tenderness.  Marked osteoarthritic joint changes Status post multiple orthopedic procedures, including bilateral shoulder surgery, bilateral knee surgery, and multiple right foot surgeries Healed stasis ulcer, left medial mid lower leg  Neurological: She is alert and oriented to person, place, and time. She has normal reflexes. No cranial nerve deficit. She exhibits normal muscle tone. Coordination normal.  Skin: Skin is warm and dry. No rash noted.  Psychiatric: She has a normal mood and affect. Her behavior is normal.          Assessment & Plan:

## 2013-10-09 NOTE — Patient Instructions (Signed)
Limit your sodium (Salt) intake    It is important that you exercise regularly, at least 20 minutes 3 to 4 times per week.  If you develop chest pain or shortness of breath seek  medical attention.  Health Maintenance Adopting a healthy lifestyle and getting preventive care can go a long way to promote health and wellness. Talk with your health care provider about what schedule of regular examinations is right for you. This is a good chance for you to check in with your provider about disease prevention and staying healthy. In between checkups, there are plenty of things you can do on your own. Experts have done a lot of research about which lifestyle changes and preventive measures are most likely to keep you healthy. Ask your health care provider for more information. WEIGHT AND DIET  Eat a healthy diet  Be sure to include plenty of vegetables, fruits, low-fat dairy products, and lean protein.  Do not eat a lot of foods high in solid fats, added sugars, or salt.  Get regular exercise. This is one of the most important things you can do for your health.  Most adults should exercise for at least 150 minutes each week. The exercise should increase your heart rate and make you sweat (moderate-intensity exercise).  Most adults should also do strengthening exercises at least twice a week. This is in addition to the moderate-intensity exercise.  Maintain a healthy weight  Body mass index (BMI) is a measurement that can be used to identify possible weight problems. It estimates body fat based on height and weight. Your health care provider can help determine your BMI and help you achieve or maintain a healthy weight.  For females 55 years of age and older:   A BMI below 18.5 is considered underweight.  A BMI of 18.5 to 24.9 is normal.  A BMI of 25 to 29.9 is considered overweight.  A BMI of 30 and above is considered obese.  Watch levels of cholesterol and blood lipids  You should  start having your blood tested for lipids and cholesterol at 72 years of age, then have this test every 5 years.  You may need to have your cholesterol levels checked more often if:  Your lipid or cholesterol levels are high.  You are older than 72 years of age.  You are at high risk for heart disease.  CANCER SCREENING   Lung Cancer  Lung cancer screening is recommended for adults 20-26 years old who are at high risk for lung cancer because of a history of smoking.  A yearly low-dose CT scan of the lungs is recommended for people who:  Currently smoke.  Have quit within the past 15 years.  Have at least a 30-pack-year history of smoking. A pack year is smoking an average of one pack of cigarettes a day for 1 year.  Yearly screening should continue until it has been 15 years since you quit.  Yearly screening should stop if you develop a health problem that would prevent you from having lung cancer treatment.  Breast Cancer  Practice breast self-awareness. This means understanding how your breasts normally appear and feel.  It also means doing regular breast self-exams. Let your health care provider know about any changes, no matter how small.  If you are in your 20s or 30s, you should have a clinical breast exam (CBE) by a health care provider every 1-3 years as part of a regular health exam.  If you are  40 or older, have a CBE every year. Also consider having a breast X-ray (mammogram) every year.  If you have a family history of breast cancer, talk to your health care provider about genetic screening.  If you are at high risk for breast cancer, talk to your health care provider about having an MRI and a mammogram every year.  Breast cancer gene (BRCA) assessment is recommended for women who have family members with BRCA-related cancers. BRCA-related cancers include:  Breast.  Ovarian.  Tubal.  Peritoneal cancers.  Results of the assessment will determine the  need for genetic counseling and BRCA1 and BRCA2 testing. Cervical Cancer Routine pelvic examinations to screen for cervical cancer are no longer recommended for nonpregnant women who are considered low risk for cancer of the pelvic organs (ovaries, uterus, and vagina) and who do not have symptoms. A pelvic examination may be necessary if you have symptoms including those associated with pelvic infections. Ask your health care provider if a screening pelvic exam is right for you.   The Pap test is the screening test for cervical cancer for women who are considered at risk.  If you had a hysterectomy for a problem that was not cancer or a condition that could lead to cancer, then you no longer need Pap tests.  If you are older than 65 years, and you have had normal Pap tests for the past 10 years, you no longer need to have Pap tests.  If you have had past treatment for cervical cancer or a condition that could lead to cancer, you need Pap tests and screening for cancer for at least 20 years after your treatment.  If you no longer get a Pap test, assess your risk factors if they change (such as having a new sexual partner). This can affect whether you should start being screened again.  Some women have medical problems that increase their chance of getting cervical cancer. If this is the case for you, your health care provider may recommend more frequent screening and Pap tests.  The human papillomavirus (HPV) test is another test that may be used for cervical cancer screening. The HPV test looks for the virus that can cause cell changes in the cervix. The cells collected during the Pap test can be tested for HPV.  The HPV test can be used to screen women 30 years of age and older. Getting tested for HPV can extend the interval between normal Pap tests from three to five years.  An HPV test also should be used to screen women of any age who have unclear Pap test results.  After 72 years of age,  women should have HPV testing as often as Pap tests.  Colorectal Cancer  This type of cancer can be detected and often prevented.  Routine colorectal cancer screening usually begins at 72 years of age and continues through 72 years of age.  Your health care provider may recommend screening at an earlier age if you have risk factors for colon cancer.  Your health care provider may also recommend using home test kits to check for hidden blood in the stool.  A small camera at the end of a tube can be used to examine your colon directly (sigmoidoscopy or colonoscopy). This is done to check for the earliest forms of colorectal cancer.  Routine screening usually begins at age 50.  Direct examination of the colon should be repeated every 5-10 years through 72 years of age. However, you may need   to be screened more often if early forms of precancerous polyps or small growths are found. Skin Cancer  Check your skin from head to toe regularly.  Tell your health care provider about any new moles or changes in moles, especially if there is a change in a mole's shape or color.  Also tell your health care provider if you have a mole that is larger than the size of a pencil eraser.  Always use sunscreen. Apply sunscreen liberally and repeatedly throughout the day.  Protect yourself by wearing long sleeves, pants, a wide-brimmed hat, and sunglasses whenever you are outside. HEART DISEASE, DIABETES, AND HIGH BLOOD PRESSURE   Have your blood pressure checked at least every 1-2 years. High blood pressure causes heart disease and increases the risk of stroke.  If you are between 30 years and 41 years old, ask your health care provider if you should take aspirin to prevent strokes.  Have regular diabetes screenings. This involves taking a blood sample to check your fasting blood sugar level.  If you are at a normal weight and have a low risk for diabetes, have this test once every three years after 72  years of age.  If you are overweight and have a high risk for diabetes, consider being tested at a younger age or more often. PREVENTING INFECTION  Hepatitis B  If you have a higher risk for hepatitis B, you should be screened for this virus. You are considered at high risk for hepatitis B if:  You were born in a country where hepatitis B is common. Ask your health care provider which countries are considered high risk.  Your parents were born in a high-risk country, and you have not been immunized against hepatitis B (hepatitis B vaccine).  You have HIV or AIDS.  You use needles to inject street drugs.  You live with someone who has hepatitis B.  You have had sex with someone who has hepatitis B.  You get hemodialysis treatment.  You take certain medicines for conditions, including cancer, organ transplantation, and autoimmune conditions. Hepatitis C  Blood testing is recommended for:  Everyone born from [birth year] through 1964.  Anyone with known risk factors for hepatitis C. Sexually transmitted infections (STIs)  You should be screened for sexually transmitted infections (STIs) including gonorrhea and chlamydia if:  You are sexually active and are younger than 72 years of age.  You are older than 72 years of age and your health care provider tells you that you are at risk for this type of infection.  Your sexual activity has changed since you were last screened and you are at an increased risk for chlamydia or gonorrhea. Ask your health care provider if you are at risk.  If you do not have HIV, but are at risk, it may be recommended that you take a prescription medicine daily to prevent HIV infection. This is called pre-exposure prophylaxis (PrEP). You are considered at risk if:  You are sexually active and do not regularly use condoms or know the HIV status of your partner(s).  You take drugs by injection.  You are sexually active with a partner who has HIV. Talk with  your health care provider about whether you are at high risk of being infected with HIV. If you choose to begin PrEP, you should first be tested for HIV. You should then be tested every 3 months for as long as you are taking PrEP.  PREGNANCY   If you are premenopausal  and you may become pregnant, ask your health care provider about preconception counseling.  If you may become pregnant, take 400 to 800 micrograms (mcg) of folic acid every day.  If you want to prevent pregnancy, talk to your health care provider about birth control (contraception). OSTEOPOROSIS AND MENOPAUSE   Osteoporosis is a disease in which the bones lose minerals and strength with aging. This can result in serious bone fractures. Your risk for osteoporosis can be identified using a bone density scan.  If you are 74 years of age or older, or if you are at risk for osteoporosis and fractures, ask your health care provider if you should be screened.  Ask your health care provider whether you should take a calcium or vitamin D supplement to lower your risk for osteoporosis.  Menopause may have certain physical symptoms and risks.  Hormone replacement therapy may reduce some of these symptoms and risks. Talk to your health care provider about whether hormone replacement therapy is right for you.  HOME CARE INSTRUCTIONS   Schedule regular health, dental, and eye exams.  Stay current with your immunizations.   Do not use any tobacco products including cigarettes, chewing tobacco, or electronic cigarettes.  If you are pregnant, do not drink alcohol.  If you are breastfeeding, limit how much and how often you drink alcohol.  Limit alcohol intake to no more than 1 drink per day for nonpregnant women. One drink equals 12 ounces of beer, 5 ounces of wine, or 1 ounces of hard liquor.  Do not use street drugs.  Do not share needles.  Ask your health care provider for help if you need support or information about quitting  drugs.  Tell your health care provider if you often feel depressed.  Tell your health care provider if you have ever been abused or do not feel safe at home. Document Released: 08/22/2010 Document Revised: 06/23/2013 Document Reviewed: 01/08/2013 [facility] Patient Information 2015 [place], [place]. This information is not intended to replace advice given to you by your health care provider. Make sure you discuss any questions you have with your health care provider. Cardiac Diet A cardiac diet can help stop heart disease or a stroke from happening. It involves eating less unhealthy fats and eating more healthy fats.  FOODS TO AVOID OR LIMIT  Limit saturated fats. This type of fat is found in oils and dairy products, such as:  Coconut oil.  Palm oil.  Cocoa butter.  Butter.  Avoid trans-fat or hydrogenated oils. These are found in fried or pre-made baked goods, such as:  Margarine.  Pre-made cookies, cakes, and crackers.  Limit processed meats (hot dogs, deli meats, sausage) to 3 ounces a week.  Limit high-fat meats (marbled meats, fried chicken, or chicken with skin) to 3 ounces a week.  Limit salt (sodium) to 1500 milligrams a day.   Limit sweets and drinks with added sugar to no more than 5 servings a week. One serving is:  1 tablespoon of sugar.  1 tablespoon of jelly or jam.   cup sorbet.  1 cup lemonade.   cup regular soda. EAT MORE OF THE FOLLOWING FOODS Fruit  Eat 4to 5 servings a day. One serving of fruit is:  1 medium whole fruit.   cup dried fruit.   cup of fresh, frozen, or canned fruit.   cup 100% fruit juice. Vegetables  Eat 4 to 5 servings a day. One serving is:  1 cup raw leafy vegetables.   cup  raw or cooked, cut-up vegetables.   cup vegetable juice. Whole Grains  Eat 3 servings a day (1 ounce equals 1 serving). Legumes (such as beans, peas, and lentils)   Eat at least 4 servings a week ( cup equals 1 serving). Nuts and  Seeds   Eat at least 4 servings a week ( cup equals 1 serving). Dietary Fiber  Eat 20 to 30 grams a day. Some foods high in dietary fiber include:  Dried beans.  Citrus fruits.  Apples, bananas.  Broccoli, Brussels sprouts, and eggplant.  Oats. Omega-3 Fats  Eat food with omega-3 fats. You can also take a dietary pill (supplement) that has 1 gram of DHA and EPA. Have 3.5 ounces of fatty fish a week, such as:  Salmon.  Mackerel.  [place] tuna.  Sardines.  Lake trout.  Herring. PREPARING YOUR FOOD  Broil, bake, steam, or roast foods. Do not fry food. Do not cook food in butter (fat).  Use non-stick cooking sprays.  Remove skin from poultry, such as chicken and [place].  Remove fat from meat.  Take the fat off the top of stews, soups, and gravy.  Use lemon or herbs to flavor food instead of using butter or margarine.  Use nonfat yogurt, salsa, or low-fat dressings for salads. Document Released: 08/08/2011 Document Reviewed: 08/08/2011 [facility] Patient Information 2015 [place]. This information is not intended to replace advice given to you by your health care provider. Make sure you discuss any questions you have with your health care provider.

## 2013-10-10 ENCOUNTER — Telehealth: Payer: Self-pay | Admitting: Internal Medicine

## 2013-10-10 NOTE — Telephone Encounter (Signed)
Relevant patient education mailed to patient.  

## 2013-10-16 ENCOUNTER — Ambulatory Visit (INDEPENDENT_AMBULATORY_CARE_PROVIDER_SITE_OTHER): Payer: Medicare Other | Admitting: Pharmacist

## 2013-10-16 DIAGNOSIS — I359 Nonrheumatic aortic valve disorder, unspecified: Secondary | ICD-10-CM

## 2013-10-16 DIAGNOSIS — Z952 Presence of prosthetic heart valve: Secondary | ICD-10-CM

## 2013-10-16 DIAGNOSIS — Z954 Presence of other heart-valve replacement: Secondary | ICD-10-CM

## 2013-10-16 LAB — POCT INR: INR: 2.3

## 2013-11-13 ENCOUNTER — Other Ambulatory Visit: Payer: Self-pay | Admitting: Internal Medicine

## 2013-11-26 ENCOUNTER — Telehealth: Payer: Self-pay | Admitting: Internal Medicine

## 2013-11-26 MED ORDER — HYDROCODONE-ACETAMINOPHEN 7.5-325 MG PO TABS: 1.0000 | ORAL_TABLET | Freq: Four times a day (QID) | ORAL | Status: DC | PRN

## 2013-11-26 NOTE — Telephone Encounter (Signed)
Pt needs new rx hydrocodone °

## 2013-11-26 NOTE — Telephone Encounter (Signed)
Pt notified Rx ready for pickup. Rx printed and signed.  

## 2013-12-04 ENCOUNTER — Ambulatory Visit (INDEPENDENT_AMBULATORY_CARE_PROVIDER_SITE_OTHER): Payer: Medicare Other | Admitting: Pharmacist

## 2013-12-04 DIAGNOSIS — I359 Nonrheumatic aortic valve disorder, unspecified: Secondary | ICD-10-CM

## 2013-12-04 DIAGNOSIS — Z803 Family history of malignant neoplasm of breast: Secondary | ICD-10-CM | POA: Diagnosis not present

## 2013-12-04 DIAGNOSIS — Z954 Presence of other heart-valve replacement: Secondary | ICD-10-CM

## 2013-12-04 DIAGNOSIS — Z1231 Encounter for screening mammogram for malignant neoplasm of breast: Secondary | ICD-10-CM | POA: Diagnosis not present

## 2013-12-04 DIAGNOSIS — Z952 Presence of prosthetic heart valve: Secondary | ICD-10-CM

## 2013-12-04 LAB — POCT INR: INR: 3.7

## 2013-12-04 LAB — HM MAMMOGRAPHY

## 2013-12-25 ENCOUNTER — Encounter: Payer: Self-pay | Admitting: Internal Medicine

## 2014-01-01 ENCOUNTER — Other Ambulatory Visit: Payer: Self-pay | Admitting: Interventional Cardiology

## 2014-01-01 ENCOUNTER — Other Ambulatory Visit: Payer: Self-pay | Admitting: Internal Medicine

## 2014-01-02 ENCOUNTER — Other Ambulatory Visit: Payer: Self-pay | Admitting: Internal Medicine

## 2014-01-09 ENCOUNTER — Telehealth: Payer: Self-pay | Admitting: Internal Medicine

## 2014-01-09 MED ORDER — HYDROCODONE-ACETAMINOPHEN 7.5-325 MG PO TABS: 1.0000 | ORAL_TABLET | Freq: Four times a day (QID) | ORAL | Status: DC | PRN

## 2014-01-09 NOTE — Telephone Encounter (Signed)
Pt notified Rx ready for pickup. Rx printed and signed.  

## 2014-01-09 NOTE — Telephone Encounter (Signed)
Pt request refill of the following: HYDROcodone-acetaminophen (NORCO) 7.5-325 MG per tablet ° ° °Phamacy: ° °

## 2014-01-13 ENCOUNTER — Telehealth: Payer: Self-pay | Admitting: *Deleted

## 2014-01-13 NOTE — Telephone Encounter (Signed)
Close encounter error

## 2014-01-16 DIAGNOSIS — Z23 Encounter for immunization: Secondary | ICD-10-CM | POA: Diagnosis not present

## 2014-01-20 ENCOUNTER — Ambulatory Visit (INDEPENDENT_AMBULATORY_CARE_PROVIDER_SITE_OTHER): Payer: Medicare Other | Admitting: Pharmacist Clinician (PhC)/ Clinical Pharmacy Specialist

## 2014-01-20 DIAGNOSIS — I359 Nonrheumatic aortic valve disorder, unspecified: Secondary | ICD-10-CM | POA: Diagnosis not present

## 2014-01-20 DIAGNOSIS — Z954 Presence of other heart-valve replacement: Secondary | ICD-10-CM | POA: Diagnosis not present

## 2014-01-20 DIAGNOSIS — Z952 Presence of prosthetic heart valve: Secondary | ICD-10-CM

## 2014-01-20 LAB — POCT INR: INR: 5

## 2014-02-03 ENCOUNTER — Ambulatory Visit (INDEPENDENT_AMBULATORY_CARE_PROVIDER_SITE_OTHER): Payer: Medicare Other | Admitting: Pharmacist

## 2014-02-03 DIAGNOSIS — I359 Nonrheumatic aortic valve disorder, unspecified: Secondary | ICD-10-CM | POA: Diagnosis not present

## 2014-02-03 DIAGNOSIS — Z954 Presence of other heart-valve replacement: Secondary | ICD-10-CM | POA: Diagnosis not present

## 2014-02-03 DIAGNOSIS — Z952 Presence of prosthetic heart valve: Secondary | ICD-10-CM

## 2014-02-03 LAB — POCT INR: INR: 1.7

## 2014-02-26 ENCOUNTER — Ambulatory Visit (INDEPENDENT_AMBULATORY_CARE_PROVIDER_SITE_OTHER): Payer: Medicare Other | Admitting: Pharmacist

## 2014-02-26 DIAGNOSIS — Z952 Presence of prosthetic heart valve: Secondary | ICD-10-CM

## 2014-02-26 DIAGNOSIS — Z954 Presence of other heart-valve replacement: Secondary | ICD-10-CM

## 2014-02-26 DIAGNOSIS — I359 Nonrheumatic aortic valve disorder, unspecified: Secondary | ICD-10-CM | POA: Diagnosis not present

## 2014-02-26 LAB — POCT INR: INR: 2.3

## 2014-03-03 ENCOUNTER — Telehealth: Payer: Self-pay | Admitting: Internal Medicine

## 2014-03-03 MED ORDER — HYDROCODONE-ACETAMINOPHEN 7.5-325 MG PO TABS: 1.0000 | ORAL_TABLET | Freq: Four times a day (QID) | ORAL | Status: DC | PRN

## 2014-03-03 NOTE — Telephone Encounter (Signed)
Pt request refill HYDROcodone-acetaminophen (NORCO) 7.5-325 MG per tablet °

## 2014-03-03 NOTE — Telephone Encounter (Signed)
Pt notified Rx ready for pickup. Rx printed and signed.  

## 2014-03-30 ENCOUNTER — Ambulatory Visit (INDEPENDENT_AMBULATORY_CARE_PROVIDER_SITE_OTHER): Payer: Medicare Other | Admitting: Pharmacist

## 2014-03-30 DIAGNOSIS — Z952 Presence of prosthetic heart valve: Secondary | ICD-10-CM

## 2014-03-30 DIAGNOSIS — Z954 Presence of other heart-valve replacement: Secondary | ICD-10-CM

## 2014-03-30 DIAGNOSIS — I359 Nonrheumatic aortic valve disorder, unspecified: Secondary | ICD-10-CM

## 2014-03-30 LAB — POCT INR: INR: 3.3

## 2014-03-31 DIAGNOSIS — Z471 Aftercare following joint replacement surgery: Secondary | ICD-10-CM | POA: Diagnosis not present

## 2014-03-31 DIAGNOSIS — Z96611 Presence of right artificial shoulder joint: Secondary | ICD-10-CM | POA: Diagnosis not present

## 2014-03-31 DIAGNOSIS — Z96612 Presence of left artificial shoulder joint: Secondary | ICD-10-CM | POA: Diagnosis not present

## 2014-04-03 ENCOUNTER — Other Ambulatory Visit: Payer: Self-pay | Admitting: Interventional Cardiology

## 2014-04-09 ENCOUNTER — Telehealth: Payer: Self-pay | Admitting: Internal Medicine

## 2014-04-09 NOTE — Telephone Encounter (Signed)
Patient is requesting a re-fill on HYDROcodone-acetaminophen (NORCO) 7.5-325 MG per tablet.  She has an appointment on 04/10/14 at 2:30 and would like to get the Rx then, if possible.

## 2014-04-09 NOTE — Telephone Encounter (Signed)
Will print Rx at pt's appt on 2/19.

## 2014-04-10 ENCOUNTER — Encounter: Payer: Self-pay | Admitting: Internal Medicine

## 2014-04-10 ENCOUNTER — Ambulatory Visit (INDEPENDENT_AMBULATORY_CARE_PROVIDER_SITE_OTHER): Payer: Medicare Other | Admitting: Internal Medicine

## 2014-04-10 VITALS — BP 140/72 | Temp 98.6°F | Wt 126.0 lb

## 2014-04-10 DIAGNOSIS — E039 Hypothyroidism, unspecified: Secondary | ICD-10-CM | POA: Diagnosis not present

## 2014-04-10 DIAGNOSIS — I10 Essential (primary) hypertension: Secondary | ICD-10-CM

## 2014-04-10 DIAGNOSIS — M150 Primary generalized (osteo)arthritis: Secondary | ICD-10-CM

## 2014-04-10 DIAGNOSIS — R350 Frequency of micturition: Secondary | ICD-10-CM | POA: Diagnosis not present

## 2014-04-10 DIAGNOSIS — R319 Hematuria, unspecified: Secondary | ICD-10-CM

## 2014-04-10 DIAGNOSIS — M159 Polyosteoarthritis, unspecified: Secondary | ICD-10-CM

## 2014-04-10 LAB — POCT URINALYSIS DIPSTICK
Bilirubin, UA: NEGATIVE
Glucose, UA: NEGATIVE
KETONES UA: NEGATIVE
Leukocytes, UA: NEGATIVE
Nitrite, UA: NEGATIVE
Spec Grav, UA: 1.01
Urobilinogen, UA: NEGATIVE
pH, UA: 5

## 2014-04-10 MED ORDER — WARFARIN SODIUM 5 MG PO TABS: ORAL_TABLET | ORAL | Status: DC

## 2014-04-10 MED ORDER — HYDROCODONE-ACETAMINOPHEN 7.5-325 MG PO TABS: 1.0000 | ORAL_TABLET | Freq: Four times a day (QID) | ORAL | Status: DC | PRN

## 2014-04-10 NOTE — Patient Instructions (Signed)
Urology consultation as discussed  Return as scheduled for your annual exam  Call or return to clinic prn if these symptoms worsen or fail to improve as anticipated.

## 2014-04-10 NOTE — Progress Notes (Signed)
Subjective:    Patient ID: Paula Ferguson, female    DOB: [birth year], 74 y.o.   MRN: [id number]  HPI  74 year old patient who is on chronic Coumadin.  Following mechanical aVR.  She complains of a long history of urinary frequency but admits to chronic copious fluid intake for the past 4-5 months.  She has  Noted an odor to her urine.  She denies any gross hematuria or color change of the urine. Urinalysis reviewed today and revealed large occult blood, otherwise normal.  No history of kidney stones  Past Medical History  Diagnosis Date  . ANEMIA 09/15/2009  . HYPERLIPIDEMIA 02/07/2007  . HYPERTENSION 09/14/2006  . HYPOTHYROIDISM 09/14/2006  . LIVER FUNCTION TESTS, ABNORMAL, HX OF 01/12/2009  . OLECRANON BURSITIS, LEFT 08/26/2007  . Blood transfusion 2000's; 11/17/2011    S/P "one of my other shoulder surgeries"; S/P left shoulder replacement  . CORONARY ARTERY DISEASE 08/26/2007    LOV Dr Tamala Julian 1/13 with EKG and clearance on chart  . Pneumonia     hx  . Heart murmur     hx- surgery  . OSTEOARTHRITIS 02/07/2007    "thumbs; knees; shoulders; ?back"  . PTSD (post-traumatic stress disorder) 1976  . Depression     History   Social History  . Marital Status: Widowed    Spouse Name: N/A  . Number of Children: N/A  . Years of Education: N/A   Occupational History  . Not on file.   Social History Main Topics  . Smoking status: Never Smoker   . Smokeless tobacco: Never Used  . Alcohol Use: No  . Drug Use: No  . Sexual Activity: Not Currently   Other Topics Concern  . Not on file   Social History Narrative    Past Surgical History  Procedure Laterality Date  . Joint replacement      right shoulder arthroscopy, left partial knee replacement  . Colonoscopy    . Total knee arthroplasty  03/21/2011    Procedure: TOTAL KNEE ARTHROPLASTY;  Surgeon: Mauri Pole, MD;  Location: [facility];  Service: Orthopedics;  Laterality: Right;  . Total shoulder replacement  2012; 11/17/2011     right; left  . Tonsillectomy and adenoidectomy  ~ 1964  . Appendectomy  1981  . Abdominal hysterectomy  1981  . Cholecystectomy  1990's  . Knee arthroscopy  1980's; 2012    left; right  . Partial knee arthroplasty  1990's    left  . Transfer / transplant ankle tendon superficial / deep  1990's    "bunch of surgeries after dr ruptured posterior tibial tendon", right foot  . Carpal tunnel release  1990's    bilaterally  . Shoulder arthroscopy  2000's    right; "3 surgeries";  Marland Kitchen Shoulder arthroscopy w/ rotator cuff repair  1990's  . Cardiac valve replacement  2003    "aortic"  . Coronary artery bypass graft  2003    CABG X1 "mammary" w/aortic valve replacement   . Cardiac catheterization  2003  . Dilation and curettage of uterus  1981?  Marland Kitchen Fracture surgery    . Exploratory laparotomy  ~ 1981  . Steriod injection  11/17/2011    Procedure: STEROID INJECTION;  Surgeon: Augustin Schooling, MD;  Location: [facility];  Service: Orthopedics;  Laterality: Right;  cortizone injection right thumb  . Reverse shoulder arthroplasty  11/17/2011    Procedure: REVERSE SHOULDER ARTHROPLASTY;  Surgeon: Augustin Schooling, MD;  Location: [facility];  Service:  Orthopedics;  Laterality: Left;  left reverse shoulder arthroplasty  . V43.3      heart valve    No family history on file.  Allergies  Allergen Reactions  . Hydromorphone Hcl Itching    "Dilaudid"  . Meperidine Hcl Itching  . Morphine Sulfate Itching    Current Outpatient Prescriptions on File Prior to Visit  Medication Sig Dispense Refill  . BIOTIN PO Take 1 tablet by mouth daily.    Marland Kitchen buPROPion (WELLBUTRIN SR) 150 MG 12 hr tablet Take 1 tablet (150 mg total) by mouth daily. 90 tablet 3  . Calcium Citrate-Vitamin D (CITRACAL PETITES/VITAMIN D PO) Take 2 tablets by mouth daily.    . cholecalciferol (VITAMIN D) 1000 UNITS tablet Take 1,000 Units by mouth daily.    . Cyanocobalamin (VITAMIN B-12 PO) Take 1 tablet by mouth daily.    . ferrous sulfate  325 (65 FE) MG tablet Take 325 mg by mouth daily with breakfast.     . levothyroxine (SYNTHROID, LEVOTHROID) 100 MCG tablet TAKE 1 TABLET BY MOUTH DAILY. 90 tablet 1  . losartan (COZAAR) 100 MG tablet TAKE 1 TABLET BY MOUTH DAILY. 90 tablet 1  . methocarbamol (ROBAXIN) 500 MG tablet TAKE 1 TABLET BY MOUTH 3 TIMES A DAY AS NEEDED. 60 tablet 2  . Multiple Vitamins-Minerals (CENTRUM PO) Take 1 tablet by mouth daily.    . Omega-3 Fatty Acids (FISH OIL PO) Take 1 tablet by mouth daily.    . traZODone (DESYREL) 100 MG tablet TAKE 1 TABLET BY MOUTH AT BEDTIME. 90 tablet 1  . verapamil (CALAN-SR) 240 MG CR tablet TAKE 1 TABLET BY MOUTH AT BEDTIME. 90 tablet 1  . warfarin (COUMADIN) 5 MG tablet TAKE 1 TABLET (5 MG TOTAL) BY MOUTH AS DIRECTED. 90 tablet 1  . HYDROcodone-acetaminophen (NORCO) 7.5-325 MG per tablet Take 1 tablet by mouth every 6 (six) hours as needed. (Patient not taking: Reported on 04/10/2014) 90 tablet 0   No current facility-administered medications on file prior to visit.    BP 140/72 mmHg  Temp(Src) 98.6 F (37 C)  Wt 126 lb (57.153 kg)     Review of Systems  Constitutional: Negative.   HENT: Negative for congestion, dental problem, hearing loss, rhinorrhea, sinus pressure, sore throat and tinnitus.   Eyes: Negative for pain, discharge and visual disturbance.  Respiratory: Negative for cough and shortness of breath.   Cardiovascular: Negative for chest pain, palpitations and leg swelling.  Gastrointestinal: Negative for nausea, vomiting, abdominal pain, diarrhea, constipation, blood in stool and abdominal distention.  Endocrine: Positive for polyuria.  Genitourinary: Positive for frequency. Negative for dysuria, urgency, hematuria, flank pain, vaginal bleeding, vaginal discharge, difficulty urinating, vaginal pain and pelvic pain.  Musculoskeletal: Negative for joint swelling, arthralgias and gait problem.  Skin: Negative for rash.  Neurological: Negative for dizziness,  syncope, speech difficulty, weakness, numbness and headaches.  Hematological: Negative for adenopathy.  Psychiatric/Behavioral: Negative for behavioral problems, dysphoric mood and agitation. The patient is not nervous/anxious.        Objective:   Physical Exam  Constitutional: She is oriented to person, place, and time. She appears well-developed and well-nourished.  HENT:  Head: Normocephalic.  Right Ear: External ear normal.  Left Ear: External ear normal.  Mouth/Throat: Oropharynx is clear and moist.  Eyes: Conjunctivae and EOM are normal. Pupils are equal, round, and reactive to light.  Neck: Normal range of motion. Neck supple. No thyromegaly present.  Cardiovascular: Normal rate, regular rhythm and intact distal  pulses.   Prosthetic heart sounds  Pulmonary/Chest: Effort normal and breath sounds normal.  Abdominal: Soft. Bowel sounds are normal. She exhibits no mass. There is no tenderness.  Musculoskeletal: Normal range of motion.  Lymphadenopathy:    She has no cervical adenopathy.  Neurological: She is alert and oriented to person, place, and time.  Skin: Skin is warm and dry. No rash noted.  Psychiatric: She has a normal mood and affect. Her behavior is normal.          Assessment & Plan:   Urinary frequency.  Probably secondary to liberal fluid intake Hematuria/chronic Coumadin anticoagulation.  Will set up for urological evaluation Essential hypertension, stable  CPX as scheduled in the fall

## 2014-04-11 ENCOUNTER — Telehealth: Payer: Self-pay | Admitting: Internal Medicine

## 2014-04-11 NOTE — Telephone Encounter (Signed)
emmi emailed °

## 2014-04-15 ENCOUNTER — Telehealth: Payer: Self-pay | Admitting: Pharmacist

## 2014-04-15 NOTE — Telephone Encounter (Signed)
Spoke with pt and she states she went to PCP due to having to void so frequently and they did urine and found blood present. Appt made for her to see Urologist and we changed her coumadin appt from March 3rd to February 26th  to check her INR and pt states understanding

## 2014-04-15 NOTE — Telephone Encounter (Signed)
New message    Patient is calling with concerns about her coumadin. Patient states that she has blood in her urine. Please give a call

## 2014-04-16 DIAGNOSIS — Z471 Aftercare following joint replacement surgery: Secondary | ICD-10-CM | POA: Diagnosis not present

## 2014-04-16 DIAGNOSIS — M1712 Unilateral primary osteoarthritis, left knee: Secondary | ICD-10-CM | POA: Diagnosis not present

## 2014-04-16 DIAGNOSIS — Z96651 Presence of right artificial knee joint: Secondary | ICD-10-CM | POA: Diagnosis not present

## 2014-04-16 DIAGNOSIS — Z96652 Presence of left artificial knee joint: Secondary | ICD-10-CM | POA: Diagnosis not present

## 2014-04-17 ENCOUNTER — Ambulatory Visit (INDEPENDENT_AMBULATORY_CARE_PROVIDER_SITE_OTHER): Payer: Medicare Other | Admitting: *Deleted

## 2014-04-17 DIAGNOSIS — I359 Nonrheumatic aortic valve disorder, unspecified: Secondary | ICD-10-CM | POA: Diagnosis not present

## 2014-04-17 DIAGNOSIS — Z952 Presence of prosthetic heart valve: Secondary | ICD-10-CM

## 2014-04-17 DIAGNOSIS — Z954 Presence of other heart-valve replacement: Secondary | ICD-10-CM | POA: Diagnosis not present

## 2014-04-17 LAB — POCT INR: INR: 3

## 2014-04-20 ENCOUNTER — Other Ambulatory Visit (HOSPITAL_COMMUNITY): Payer: Self-pay | Admitting: Orthopedic Surgery

## 2014-04-20 DIAGNOSIS — Z471 Aftercare following joint replacement surgery: Secondary | ICD-10-CM

## 2014-04-20 DIAGNOSIS — Z96651 Presence of right artificial knee joint: Principal | ICD-10-CM

## 2014-04-27 ENCOUNTER — Other Ambulatory Visit: Payer: Self-pay | Admitting: Internal Medicine

## 2014-04-27 ENCOUNTER — Encounter (HOSPITAL_COMMUNITY)
Admission: RE | Admit: 2014-04-27 | Discharge: 2014-04-27 | Disposition: A | Discharge status: Home / Self Care | Payer: Medicare Other | Source: Ambulatory Visit | Attending: Orthopedic Surgery | Admitting: Orthopedic Surgery

## 2014-04-27 DIAGNOSIS — M25561 Pain in right knee: Secondary | ICD-10-CM | POA: Diagnosis not present

## 2014-04-27 DIAGNOSIS — Z96651 Presence of right artificial knee joint: Secondary | ICD-10-CM | POA: Diagnosis not present

## 2014-04-27 DIAGNOSIS — Z471 Aftercare following joint replacement surgery: Secondary | ICD-10-CM | POA: Diagnosis not present

## 2014-04-27 MED ORDER — TECHNETIUM TC 99M MEDRONATE IV KIT
25.0000 | PACK | Freq: Once | INTRAVENOUS | Status: AC | PRN
  Administered 2014-04-27: 25 via INTRAVENOUS

## 2014-04-28 ENCOUNTER — Telehealth: Payer: Self-pay | Admitting: Internal Medicine

## 2014-04-28 NOTE — Telephone Encounter (Signed)
Please see message and advise 

## 2014-04-28 NOTE — Telephone Encounter (Signed)
Amoxicillin 500   #8 2 tablets 1 hour before dental procedure and an additional 2 tablets 6 hours later

## 2014-04-28 NOTE — Telephone Encounter (Signed)
Pt called to say that she has changed dentist and need a rx for Amoxicillin before she can see the new dentist   Pharmacy [facility]   [phone]

## 2014-04-29 MED ORDER — AMOXICILLIN 500 MG PO CAPS: ORAL_CAPSULE | ORAL | Status: DC

## 2014-04-29 NOTE — Telephone Encounter (Signed)
Left detailed message Rx sent to the pharmacy. 

## 2014-05-09 ENCOUNTER — Other Ambulatory Visit: Payer: Self-pay | Admitting: Internal Medicine

## 2014-05-15 ENCOUNTER — Ambulatory Visit (INDEPENDENT_AMBULATORY_CARE_PROVIDER_SITE_OTHER): Payer: Medicare Other | Admitting: *Deleted

## 2014-05-15 DIAGNOSIS — I359 Nonrheumatic aortic valve disorder, unspecified: Secondary | ICD-10-CM

## 2014-05-15 DIAGNOSIS — Z952 Presence of prosthetic heart valve: Secondary | ICD-10-CM

## 2014-05-15 DIAGNOSIS — Z954 Presence of other heart-valve replacement: Secondary | ICD-10-CM | POA: Diagnosis not present

## 2014-05-15 LAB — POCT INR: INR: 5.3

## 2014-05-20 ENCOUNTER — Telehealth: Payer: Self-pay | Admitting: Internal Medicine

## 2014-05-20 NOTE — Telephone Encounter (Signed)
Pt request refill of the following: HYDROcodone-acetaminophen (NORCO) 7.5-325 MG per tablet ° ° °Phamacy: ° °

## 2014-05-21 MED ORDER — HYDROCODONE-ACETAMINOPHEN 7.5-325 MG PO TABS: 1.0000 | ORAL_TABLET | Freq: Four times a day (QID) | ORAL | Status: DC | PRN

## 2014-05-21 NOTE — Telephone Encounter (Signed)
Pt notified Rx ready for pickup. Rx printed and signed.  

## 2014-05-21 NOTE — Telephone Encounter (Signed)
Pt will be here after lunch today to pu rx. (looks like it was approved per notes, no need to call pt)

## 2014-05-25 ENCOUNTER — Ambulatory Visit (INDEPENDENT_AMBULATORY_CARE_PROVIDER_SITE_OTHER): Payer: Medicare Other

## 2014-05-25 DIAGNOSIS — Z952 Presence of prosthetic heart valve: Secondary | ICD-10-CM

## 2014-05-25 DIAGNOSIS — I359 Nonrheumatic aortic valve disorder, unspecified: Secondary | ICD-10-CM | POA: Diagnosis not present

## 2014-05-25 DIAGNOSIS — Z954 Presence of other heart-valve replacement: Secondary | ICD-10-CM

## 2014-05-25 LAB — POCT INR: INR: 2.4

## 2014-06-09 ENCOUNTER — Ambulatory Visit (INDEPENDENT_AMBULATORY_CARE_PROVIDER_SITE_OTHER): Payer: Medicare Other

## 2014-06-09 DIAGNOSIS — Z952 Presence of prosthetic heart valve: Secondary | ICD-10-CM

## 2014-06-09 DIAGNOSIS — I359 Nonrheumatic aortic valve disorder, unspecified: Secondary | ICD-10-CM

## 2014-06-09 DIAGNOSIS — Z954 Presence of other heart-valve replacement: Secondary | ICD-10-CM

## 2014-06-09 LAB — POCT INR: INR: 3.2

## 2014-06-10 DIAGNOSIS — R312 Other microscopic hematuria: Secondary | ICD-10-CM | POA: Diagnosis not present

## 2014-06-26 ENCOUNTER — Ambulatory Visit (INDEPENDENT_AMBULATORY_CARE_PROVIDER_SITE_OTHER): Payer: Medicare Other | Admitting: *Deleted

## 2014-06-26 DIAGNOSIS — Z954 Presence of other heart-valve replacement: Secondary | ICD-10-CM | POA: Diagnosis not present

## 2014-06-26 DIAGNOSIS — I359 Nonrheumatic aortic valve disorder, unspecified: Secondary | ICD-10-CM

## 2014-06-26 DIAGNOSIS — Z952 Presence of prosthetic heart valve: Secondary | ICD-10-CM

## 2014-06-26 LAB — POCT INR: INR: 2.4

## 2014-07-06 ENCOUNTER — Other Ambulatory Visit: Payer: Self-pay | Admitting: Internal Medicine

## 2014-07-08 ENCOUNTER — Telehealth: Payer: Self-pay | Admitting: Internal Medicine

## 2014-07-08 NOTE — Telephone Encounter (Signed)
Pt needs new rx hydrocodone °

## 2014-07-09 MED ORDER — HYDROCODONE-ACETAMINOPHEN 7.5-325 MG PO TABS: 1.0000 | ORAL_TABLET | Freq: Four times a day (QID) | ORAL | Status: DC | PRN

## 2014-07-09 NOTE — Telephone Encounter (Signed)
Pt notified Rx ready for pickup. Rx printed and signed.  

## 2014-07-10 ENCOUNTER — Ambulatory Visit (INDEPENDENT_AMBULATORY_CARE_PROVIDER_SITE_OTHER): Payer: Medicare Other | Admitting: *Deleted

## 2014-07-10 DIAGNOSIS — Z954 Presence of other heart-valve replacement: Secondary | ICD-10-CM

## 2014-07-10 DIAGNOSIS — I359 Nonrheumatic aortic valve disorder, unspecified: Secondary | ICD-10-CM | POA: Diagnosis not present

## 2014-07-10 DIAGNOSIS — Z952 Presence of prosthetic heart valve: Secondary | ICD-10-CM

## 2014-07-10 LAB — POCT INR: INR: 2.4

## 2014-07-16 DIAGNOSIS — Z471 Aftercare following joint replacement surgery: Secondary | ICD-10-CM | POA: Diagnosis not present

## 2014-07-16 DIAGNOSIS — Z96651 Presence of right artificial knee joint: Secondary | ICD-10-CM | POA: Diagnosis not present

## 2014-07-16 DIAGNOSIS — M25561 Pain in right knee: Secondary | ICD-10-CM | POA: Diagnosis not present

## 2014-07-30 ENCOUNTER — Ambulatory Visit (INDEPENDENT_AMBULATORY_CARE_PROVIDER_SITE_OTHER): Payer: Medicare Other | Admitting: *Deleted

## 2014-07-30 DIAGNOSIS — R312 Other microscopic hematuria: Secondary | ICD-10-CM | POA: Diagnosis not present

## 2014-07-30 DIAGNOSIS — Z954 Presence of other heart-valve replacement: Secondary | ICD-10-CM | POA: Diagnosis not present

## 2014-07-30 DIAGNOSIS — Z952 Presence of prosthetic heart valve: Secondary | ICD-10-CM

## 2014-07-30 DIAGNOSIS — I359 Nonrheumatic aortic valve disorder, unspecified: Secondary | ICD-10-CM | POA: Diagnosis not present

## 2014-07-30 LAB — POCT INR: INR: 1.9

## 2014-07-31 ENCOUNTER — Other Ambulatory Visit: Payer: Self-pay | Admitting: Internal Medicine

## 2014-07-31 MED ORDER — METHOCARBAMOL 500 MG PO TABS: ORAL_TABLET | ORAL | Status: DC

## 2014-07-31 NOTE — Telephone Encounter (Signed)
Rx sent 

## 2014-07-31 NOTE — Telephone Encounter (Signed)
Pt request refill methocarbamol (ROBAXIN) 500 MG tablet [facility]  Pt wants dr Raliegh Ip to know they are reverting her partial knee to a total knee I made her post hosp visit for 7/28

## 2014-08-19 NOTE — Progress Notes (Addendum)
Cardiology Office Note   Date:  08/20/2014   ID:  Paula Ferguson, DOB Dec 27, 1940, MRN [id number]  PCP:  Nyoka Cowden, MD  Cardiologist:  Sinclair Grooms, MD   Chief Complaint  Patient presents with  . Coronary Artery Disease      History of Present Illness: Paula Ferguson is a 74 y.o. female who presents for aortic mechanical valve, CAD with LIMA to LAD, hypertension, and chronic anticoagulation therapy.  She is doing well from cardiac standpoint. She specifically denies chest pain, dyspnea, orthopnea, and syncope. She has had occasional palpitations. She denies edema. She is at one instance of relatively prolonged palpitation that lasted several minutes. It occurred while walking.    Past Medical History  Diagnosis Date  . ANEMIA 09/15/2009  . HYPERLIPIDEMIA 02/07/2007  . HYPERTENSION 09/14/2006  . HYPOTHYROIDISM 09/14/2006  . LIVER FUNCTION TESTS, ABNORMAL, HX OF 01/12/2009  . OLECRANON BURSITIS, LEFT 08/26/2007  . Blood transfusion 2000's; 11/17/2011    S/P "one of my other shoulder surgeries"; S/P left shoulder replacement  . CORONARY ARTERY DISEASE 08/26/2007    LOV Dr Tamala Julian 1/13 with EKG and clearance on chart  . Pneumonia     hx  . Heart murmur     hx- surgery  . OSTEOARTHRITIS 02/07/2007    "thumbs; knees; shoulders; ?back"  . PTSD (post-traumatic stress disorder) 1976  . Depression     Past Surgical History  Procedure Laterality Date  . Joint replacement      right shoulder arthroscopy, left partial knee replacement  . Colonoscopy    . Total knee arthroplasty  03/21/2011    Procedure: TOTAL KNEE ARTHROPLASTY;  Surgeon: Mauri Pole, MD;  Location: [facility];  Service: Orthopedics;  Laterality: Right;  . Total shoulder replacement  2012; 11/17/2011    right; left  . Tonsillectomy and adenoidectomy  ~ 1964  . Appendectomy  1981  . Abdominal hysterectomy  1981  . Cholecystectomy  1990's  . Knee arthroscopy  1980's; 2012    left; right  .  Partial knee arthroplasty  1990's    left  . Transfer / transplant ankle tendon superficial / deep  1990's    "bunch of surgeries after dr ruptured posterior tibial tendon", right foot  . Carpal tunnel release  1990's    bilaterally  . Shoulder arthroscopy  2000's    right; "3 surgeries";  Marland Kitchen Shoulder arthroscopy w/ rotator cuff repair  1990's  . Cardiac valve replacement  2003    "aortic"  . Coronary artery bypass graft  2003    CABG X1 "mammary" w/aortic valve replacement   . Cardiac catheterization  2003  . Dilation and curettage of uterus  1981?  Marland Kitchen Fracture surgery    . Exploratory laparotomy  ~ 1981  . Steriod injection  11/17/2011    Procedure: STEROID INJECTION;  Surgeon: Augustin Schooling, MD;  Location: [place] [facility];  Service: Orthopedics;  Laterality: Right;  cortizone injection right thumb  . Reverse shoulder arthroplasty  11/17/2011    Procedure: REVERSE SHOULDER ARTHROPLASTY;  Surgeon: Augustin Schooling, MD;  Location: [facility];  Service: Orthopedics;  Laterality: Left;  left reverse shoulder arthroplasty  . V43.3      heart valve     Current Outpatient Prescriptions  Medication Sig Dispense Refill  . amoxicillin (AMOXIL) 500 MG capsule 2 tablets 1 hour before dental procedure and an additional 2 tablets 6 hours later 8 capsule 0  . BIOTIN PO Take 1  tablet by mouth daily.    Marland Kitchen buPROPion (WELLBUTRIN SR) 150 MG 12 hr tablet Take 1 tablet (150 mg total) by mouth daily. 90 tablet 3  . Calcium Citrate-Vitamin D (CITRACAL PETITES/VITAMIN D PO) Take 1 tablet by mouth daily.     . cholecalciferol (VITAMIN D) 1000 UNITS tablet Take 1,000 Units by mouth daily.    . Cyanocobalamin (VITAMIN B-12 PO) Take 1 tablet by mouth daily.    . ferrous sulfate 325 (65 FE) MG tablet Take 325 mg by mouth daily with breakfast.     . HYDROcodone-acetaminophen (NORCO) 7.5-325 MG per tablet Take 1 tablet by mouth every 6 (six) hours as needed for moderate pain.    Marland Kitchen levothyroxine (SYNTHROID, LEVOTHROID) 100 MCG  tablet TAKE 1 TABLET BY MOUTH DAILY. 90 tablet 0  . Multiple Vitamins-Minerals (CENTRUM PO) Take 1 tablet by mouth daily.    . Omega-3 Fatty Acids (FISH OIL PO) Take 1 tablet by mouth daily.    . traZODone (DESYREL) 100 MG tablet TAKE 1 TABLET BY MOUTH AT BEDTIME. 90 tablet 1  . verapamil (CALAN-SR) 240 MG CR tablet TAKE 1 TABLET BY MOUTH AT BEDTIME. 90 tablet 1  . warfarin (COUMADIN) 5 MG tablet TAKE 1 TABLET (5 MG TOTAL) BY MOUTH AS DIRECTED. 90 tablet 1   No current facility-administered medications for this visit.    Allergies:   Hydromorphone hcl; Meperidine hcl; and Morphine sulfate    Social History:  The patient  reports that she has never smoked. She has never used smokeless tobacco. She reports that she does not drink alcohol or use illicit drugs.   Family History:  The patient's family history includes Healthy in her sister; Kidney disease in her brother; Lung disease in her father and mother.    ROS:  Please see the history of present illness.   Otherwise, review of systems are positive for shoulder and left knee discomfort limit her activity..   All other systems are reviewed and negative.    PHYSICAL EXAM: VS:  BP 134/76 mmHg  Pulse 74  Ht 5\' 2"  (1.575 m)  Wt 57.879 kg (127 lb 9.6 oz)  BMI 23.33 kg/m2 , BMI Body mass index is 23.33 kg/(m^2). GEN: Well nourished, well developed, in no acute distress HEENT: normal Neck: no JVD, carotid bruits, or masses Cardiac: RRR; there is a 1/6 systolic murmur. The valve closure sounds are crisp. There are no  rubs, or gallops,no edema  Respiratory:  clear to auscultation bilaterally, normal work of breathing GI: soft, nontender, nondistended, + BS MS: no deformity or atrophy Skin: warm and dry, no rash Neuro:  Strength and sensation are intact Psych: euthymic mood, full affect   EKG:  EKG is ordered today. The ekg ordered today demonstrates incomplete right bundle, left anterior hemiblock, biatrial abnormality.   Recent  Labs: 10/09/2013: ALT 25; BUN 14; Creatinine, Ser 0.9; Hemoglobin 12.8; Platelets 193.0; Potassium 3.9; Sodium 137; TSH 0.74    Lipid Panel    Component Value Date/Time   CHOL 167 10/09/2013 1019   TRIG 98.0 10/09/2013 1019   HDL 45.10 10/09/2013 1019   CHOLHDL 4 10/09/2013 1019   VLDL 19.6 10/09/2013 1019   LDLCALC 102* 10/09/2013 1019      Wt Readings from Last 3 Encounters:  08/20/14 57.879 kg (127 lb 9.6 oz)  04/10/14 57.153 kg (126 lb)  10/09/13 56.7 kg (125 lb)      Other studies Reviewed: Additional studies/ records that were reviewed today include: .  2-D Doppler echocardiogram will be performed prior to the next office visit  ASSESSMENT AND PLAN:  1. Atherosclerosis of coronary artery bypass graft of native heart without angina pectoris No angina  2. History of mechanical aortic valve replacement Normal valve function based upon exam. No recent evaluation but there is no auscultatory evidence of dysfunction.  3. Essential hypertension Controlled  4. Anticoagulation goal of INR 2.5 to 3.5 Followed in Coumadin clinic  5. Palpitations: Only on one occasion.  Current medicines are reviewed at length with the patient today.  The patient does not have concerns regarding medicines.  The following changes have been made:  no change  Labs/ tests ordered today include:   Orders Placed This Encounter  Procedures  . EKG 12-Lead  . Echocardiogram     Disposition:   FU with HS  in 1 year  Signed, Sinclair Grooms, MD  08/20/2014 3:09 PM    [facility] [address], [place], [place]  [address] Phone: [phone]; Fax: [phone]

## 2014-08-20 ENCOUNTER — Ambulatory Visit (INDEPENDENT_AMBULATORY_CARE_PROVIDER_SITE_OTHER): Payer: Medicare Other | Admitting: Interventional Cardiology

## 2014-08-20 ENCOUNTER — Encounter: Payer: Self-pay | Admitting: Interventional Cardiology

## 2014-08-20 ENCOUNTER — Ambulatory Visit (INDEPENDENT_AMBULATORY_CARE_PROVIDER_SITE_OTHER): Payer: Medicare Other | Admitting: *Deleted

## 2014-08-20 VITALS — BP 134/76 | HR 74 | Ht 62.0 in | Wt 127.6 lb

## 2014-08-20 DIAGNOSIS — I359 Nonrheumatic aortic valve disorder, unspecified: Secondary | ICD-10-CM | POA: Diagnosis not present

## 2014-08-20 DIAGNOSIS — Z7901 Long term (current) use of anticoagulants: Secondary | ICD-10-CM

## 2014-08-20 DIAGNOSIS — Z5181 Encounter for therapeutic drug level monitoring: Secondary | ICD-10-CM | POA: Diagnosis not present

## 2014-08-20 DIAGNOSIS — Z954 Presence of other heart-valve replacement: Secondary | ICD-10-CM | POA: Diagnosis not present

## 2014-08-20 DIAGNOSIS — I25810 Atherosclerosis of coronary artery bypass graft(s) without angina pectoris: Secondary | ICD-10-CM | POA: Diagnosis not present

## 2014-08-20 DIAGNOSIS — I10 Essential (primary) hypertension: Secondary | ICD-10-CM | POA: Diagnosis not present

## 2014-08-20 DIAGNOSIS — Z952 Presence of prosthetic heart valve: Secondary | ICD-10-CM

## 2014-08-20 LAB — POCT INR: INR: 1.8

## 2014-08-20 NOTE — Patient Instructions (Signed)
Medication Instructions:  Your physician recommends that you continue on your current medications as directed. Please refer to the Current Medication list given to you today.   Labwork: None   Testing/Procedures: Your physician has requested that you have an echocardiogram. Echocardiography is a painless test that uses sound waves to create images of your heart. It provides your doctor with information about the size and shape of your heart and how well your heart's chambers and valves are working. This procedure takes approximately one hour. There are no restrictions for this procedure.  (To be scheduled in June 2017)  Follow-Up: Your physician wants you to follow-up in: 1 year with Dr.Smith You will receive a reminder letter in the mail two months in advance. If you don't receive a letter, please call our office to schedule the follow-up appointment.   Any Other Special Instructions Will Be Listed Below (If Applicable).

## 2014-08-27 ENCOUNTER — Telehealth: Payer: Self-pay | Admitting: Internal Medicine

## 2014-08-27 MED ORDER — HYDROCODONE-ACETAMINOPHEN 7.5-325 MG PO TABS: 1.0000 | ORAL_TABLET | Freq: Four times a day (QID) | ORAL | Status: DC | PRN

## 2014-08-27 NOTE — Telephone Encounter (Signed)
ok 

## 2014-08-27 NOTE — Telephone Encounter (Signed)
Okay to refill Hydrocodone.  

## 2014-08-27 NOTE — Telephone Encounter (Signed)
Pt request refill HYDROcodone-acetaminophen (NORCO) 7.5-325 MG per tablet °

## 2014-08-27 NOTE — Telephone Encounter (Signed)
Left detailed message Rx ready for pickup, will be at the front desk. Rx printed and signed. 

## 2014-09-03 ENCOUNTER — Ambulatory Visit (INDEPENDENT_AMBULATORY_CARE_PROVIDER_SITE_OTHER): Payer: Medicare Other | Admitting: *Deleted

## 2014-09-03 DIAGNOSIS — I359 Nonrheumatic aortic valve disorder, unspecified: Secondary | ICD-10-CM | POA: Diagnosis not present

## 2014-09-03 DIAGNOSIS — Z954 Presence of other heart-valve replacement: Secondary | ICD-10-CM | POA: Diagnosis not present

## 2014-09-03 DIAGNOSIS — Z952 Presence of prosthetic heart valve: Secondary | ICD-10-CM

## 2014-09-03 LAB — POCT INR: INR: 2.4

## 2014-09-11 ENCOUNTER — Telehealth: Payer: Self-pay | Admitting: Internal Medicine

## 2014-09-11 NOTE — Telephone Encounter (Signed)
Pt said she had postpone her surgery till Jan 2017 and is asking if Dr Raliegh Ip need to see her for any other reason .

## 2014-09-11 NOTE — Telephone Encounter (Signed)
Annual exam in 3-4 months

## 2014-09-14 NOTE — Telephone Encounter (Signed)
Spoke to pt, told her Dr. Raliegh Ip said will need to see her in 3-4 months for Annual exam/ Physical. Pt verbalized understanding.

## 2014-09-17 ENCOUNTER — Ambulatory Visit: Payer: Medicare Other | Admitting: Internal Medicine

## 2014-09-17 DIAGNOSIS — L814 Other melanin hyperpigmentation: Secondary | ICD-10-CM | POA: Diagnosis not present

## 2014-09-17 DIAGNOSIS — L570 Actinic keratosis: Secondary | ICD-10-CM | POA: Diagnosis not present

## 2014-09-17 DIAGNOSIS — L821 Other seborrheic keratosis: Secondary | ICD-10-CM | POA: Diagnosis not present

## 2014-09-18 ENCOUNTER — Ambulatory Visit: Payer: Medicare Other | Admitting: Internal Medicine

## 2014-09-25 DIAGNOSIS — M25561 Pain in right knee: Secondary | ICD-10-CM | POA: Diagnosis not present

## 2014-09-25 DIAGNOSIS — M1712 Unilateral primary osteoarthritis, left knee: Secondary | ICD-10-CM | POA: Diagnosis not present

## 2014-09-25 DIAGNOSIS — Z471 Aftercare following joint replacement surgery: Secondary | ICD-10-CM | POA: Diagnosis not present

## 2014-09-25 DIAGNOSIS — M25562 Pain in left knee: Secondary | ICD-10-CM | POA: Diagnosis not present

## 2014-09-28 ENCOUNTER — Telehealth: Payer: Self-pay | Admitting: Internal Medicine

## 2014-09-28 MED ORDER — HYDROCODONE-ACETAMINOPHEN 7.5-325 MG PO TABS: 1.0000 | ORAL_TABLET | Freq: Four times a day (QID) | ORAL | Status: DC | PRN

## 2014-09-28 NOTE — Telephone Encounter (Signed)
Patient called for a RX refill on hydrocodone acetaminophen 7.5 325 mg. Please give patient a call when it is ready for pickup. You can call patient daughter cell phone [phone] or call the home number.

## 2014-09-28 NOTE — Telephone Encounter (Signed)
Left message Rx ready for pickup. Spoke to pt's daughter Pamala Hurry told her Rx ready for pickup. Pamala Hurry verbalized understanding. Rx printed and signed.

## 2014-10-05 ENCOUNTER — Other Ambulatory Visit: Payer: Self-pay | Admitting: Internal Medicine

## 2014-10-06 ENCOUNTER — Ambulatory Visit (INDEPENDENT_AMBULATORY_CARE_PROVIDER_SITE_OTHER): Payer: Medicare Other | Admitting: *Deleted

## 2014-10-06 DIAGNOSIS — Z954 Presence of other heart-valve replacement: Secondary | ICD-10-CM

## 2014-10-06 DIAGNOSIS — H04123 Dry eye syndrome of bilateral lacrimal glands: Secondary | ICD-10-CM | POA: Diagnosis not present

## 2014-10-06 DIAGNOSIS — I359 Nonrheumatic aortic valve disorder, unspecified: Secondary | ICD-10-CM

## 2014-10-06 DIAGNOSIS — Z952 Presence of prosthetic heart valve: Secondary | ICD-10-CM

## 2014-10-06 LAB — POCT INR: INR: 2.8

## 2014-10-17 ENCOUNTER — Other Ambulatory Visit: Payer: Self-pay | Admitting: Internal Medicine

## 2014-10-19 ENCOUNTER — Inpatient Hospital Stay (HOSPITAL_COMMUNITY): Admission: RE | Admit: 2014-10-19 | Payer: Medicare Other | Source: Ambulatory Visit | Admitting: Orthopedic Surgery

## 2014-10-19 ENCOUNTER — Encounter (HOSPITAL_COMMUNITY): Admission: RE | Payer: Self-pay | Source: Ambulatory Visit

## 2014-10-19 SURGERY — TOTAL KNEE REVISION
Anesthesia: Spinal | Site: Knee | Laterality: Left

## 2014-11-02 ENCOUNTER — Other Ambulatory Visit: Payer: Self-pay | Admitting: Internal Medicine

## 2014-11-04 ENCOUNTER — Ambulatory Visit (INDEPENDENT_AMBULATORY_CARE_PROVIDER_SITE_OTHER): Payer: Medicare Other | Admitting: *Deleted

## 2014-11-04 DIAGNOSIS — Z954 Presence of other heart-valve replacement: Secondary | ICD-10-CM

## 2014-11-04 DIAGNOSIS — I359 Nonrheumatic aortic valve disorder, unspecified: Secondary | ICD-10-CM

## 2014-11-04 DIAGNOSIS — Z952 Presence of prosthetic heart valve: Secondary | ICD-10-CM

## 2014-11-04 LAB — POCT INR: INR: 3.4

## 2014-11-05 ENCOUNTER — Telehealth: Payer: Self-pay | Admitting: Internal Medicine

## 2014-11-05 NOTE — Telephone Encounter (Signed)
Pt request refill of the following: HYDROcodone-acetaminophen (NORCO) 7.5-325 MG per tablet ° ° °Phamacy: ° °

## 2014-11-05 NOTE — Telephone Encounter (Signed)
Pt is overdue for Physical, must be seen and sign controlled substance contract before Hydrocodone can be filled.

## 2014-11-06 NOTE — Telephone Encounter (Signed)
Pt scheduled  

## 2014-11-06 NOTE — Telephone Encounter (Signed)
lmovm to call and schedule an appt  °

## 2014-11-09 ENCOUNTER — Other Ambulatory Visit: Payer: Self-pay | Admitting: *Deleted

## 2014-11-09 ENCOUNTER — Encounter: Payer: Self-pay | Admitting: Internal Medicine

## 2014-11-09 ENCOUNTER — Encounter: Payer: Self-pay | Admitting: *Deleted

## 2014-11-09 ENCOUNTER — Ambulatory Visit (INDEPENDENT_AMBULATORY_CARE_PROVIDER_SITE_OTHER): Payer: Medicare Other | Admitting: Internal Medicine

## 2014-11-09 VITALS — BP 130/70 | HR 73 | Temp 98.4°F | Resp 18 | Ht 62.0 in | Wt 127.0 lb

## 2014-11-09 DIAGNOSIS — I25810 Atherosclerosis of coronary artery bypass graft(s) without angina pectoris: Secondary | ICD-10-CM

## 2014-11-09 DIAGNOSIS — E785 Hyperlipidemia, unspecified: Secondary | ICD-10-CM | POA: Diagnosis not present

## 2014-11-09 DIAGNOSIS — Z23 Encounter for immunization: Secondary | ICD-10-CM | POA: Diagnosis not present

## 2014-11-09 DIAGNOSIS — Z952 Presence of prosthetic heart valve: Secondary | ICD-10-CM

## 2014-11-09 DIAGNOSIS — I10 Essential (primary) hypertension: Secondary | ICD-10-CM

## 2014-11-09 DIAGNOSIS — H6123 Impacted cerumen, bilateral: Secondary | ICD-10-CM

## 2014-11-09 DIAGNOSIS — E039 Hypothyroidism, unspecified: Secondary | ICD-10-CM | POA: Diagnosis not present

## 2014-11-09 DIAGNOSIS — Z954 Presence of other heart-valve replacement: Secondary | ICD-10-CM

## 2014-11-09 DIAGNOSIS — M150 Primary generalized (osteo)arthritis: Secondary | ICD-10-CM | POA: Diagnosis not present

## 2014-11-09 DIAGNOSIS — M159 Polyosteoarthritis, unspecified: Secondary | ICD-10-CM

## 2014-11-09 MED ORDER — HYDROCODONE-ACETAMINOPHEN 7.5-325 MG PO TABS: 1.0000 | ORAL_TABLET | Freq: Four times a day (QID) | ORAL | Status: DC | PRN

## 2014-11-09 MED ORDER — BUPROPION HCL ER (SR) 150 MG PO TB12: 150.0000 mg | ORAL_TABLET | Freq: Every day | ORAL | Status: DC

## 2014-11-09 NOTE — Progress Notes (Signed)
Subjective:    Patient ID: ESSA WENK, female    DOB: [birth year], 74 y.o.   MRN: [id number]  HPI 74 year old patient who is seen today for follow-up.  She has a history of advanced osteoarthritis and remains on hydrocodone.  She is seen today for medicine refill.  She is followed by cardiology due to aortic stenosis.  She is status post aortic valve replacement and remains on chronic Coumadin anticoagulation.  She has coronary artery disease, essential hypertension, dyslipidemia and hypothyroidism She is doing well except for her arthritic pain Her only other complaint is diminished auditory acuity bilaterally left ear greater than the right  Past Medical History  Diagnosis Date  . ANEMIA 09/15/2009  . HYPERLIPIDEMIA 02/07/2007  . HYPERTENSION 09/14/2006  . HYPOTHYROIDISM 09/14/2006  . LIVER FUNCTION TESTS, ABNORMAL, HX OF 01/12/2009  . OLECRANON BURSITIS, LEFT 08/26/2007  . Blood transfusion 2000's; 11/17/2011    S/P "one of my other shoulder surgeries"; S/P left shoulder replacement  . CORONARY ARTERY DISEASE 08/26/2007    LOV Dr Tamala Julian 1/13 with EKG and clearance on chart  . Pneumonia     hx  . Heart murmur     hx- surgery  . OSTEOARTHRITIS 02/07/2007    "thumbs; knees; shoulders; ?back"  . PTSD (post-traumatic stress disorder) 1976  . Depression     Social History   Social History  . Marital Status: Widowed    Spouse Name: N/A  . Number of Children: N/A  . Years of Education: N/A   Occupational History  . Not on file.   Social History Main Topics  . Smoking status: Never Smoker   . Smokeless tobacco: Never Used  . Alcohol Use: No  . Drug Use: No  . Sexual Activity: Not Currently   Other Topics Concern  . Not on file   Social History Narrative    Past Surgical History  Procedure Laterality Date  . Joint replacement      right shoulder arthroscopy, left partial knee replacement  . Colonoscopy    . Total knee arthroplasty  03/21/2011    Procedure: TOTAL  KNEE ARTHROPLASTY;  Surgeon: Mauri Pole, MD;  Location: [facility];  Service: Orthopedics;  Laterality: Right;  . Total shoulder replacement  2012; 11/17/2011    right; left  . Tonsillectomy and adenoidectomy  ~ 1964  . Appendectomy  1981  . Abdominal hysterectomy  1981  . Cholecystectomy  1990's  . Knee arthroscopy  1980's; 2012    left; right  . Partial knee arthroplasty  1990's    left  . Transfer / transplant ankle tendon superficial / deep  1990's    "bunch of surgeries after dr ruptured posterior tibial tendon", right foot  . Carpal tunnel release  1990's    bilaterally  . Shoulder arthroscopy  2000's    right; "3 surgeries";  Marland Kitchen Shoulder arthroscopy w/ rotator cuff repair  1990's  . Cardiac valve replacement  2003    "aortic"  . Coronary artery bypass graft  2003    CABG X1 "mammary" w/aortic valve replacement   . Cardiac catheterization  2003  . Dilation and curettage of uterus  1981?  Marland Kitchen Fracture surgery    . Exploratory laparotomy  ~ 1981  . Steriod injection  11/17/2011    Procedure: STEROID INJECTION;  Surgeon: Augustin Schooling, MD;  Location: [facility];  Service: Orthopedics;  Laterality: Right;  cortizone injection right thumb  . Reverse shoulder arthroplasty  11/17/2011  Procedure: REVERSE SHOULDER ARTHROPLASTY;  Surgeon: Augustin Schooling, MD;  Location: [facility];  Service: Orthopedics;  Laterality: Left;  left reverse shoulder arthroplasty  . V43.3      heart valve    Family History  Problem Relation Age of Onset  . Lung disease Mother   . Lung disease Father   . Healthy Sister   . Kidney disease Brother     Allergies  Allergen Reactions  . Hydromorphone Hcl Itching    "Dilaudid"  . Meperidine Hcl Itching  . Morphine Sulfate Itching    Current Outpatient Prescriptions on File Prior to Visit  Medication Sig Dispense Refill  . amoxicillin (AMOXIL) 500 MG capsule 2 tablets 1 hour before dental procedure and an additional 2 tablets 6 hours later 8 capsule 0  .  BIOTIN PO Take 1 tablet by mouth daily.    . Calcium Citrate-Vitamin D (CITRACAL PETITES/VITAMIN D PO) Take 1 tablet by mouth daily.     . cholecalciferol (VITAMIN D) 1000 UNITS tablet Take 1,000 Units by mouth daily.    . Cyanocobalamin (VITAMIN B-12 PO) Take 1 tablet by mouth daily.    . ferrous sulfate 325 (65 FE) MG tablet Take 325 mg by mouth daily with breakfast.     . HYDROcodone-acetaminophen (NORCO) 7.5-325 MG per tablet Take 1 tablet by mouth every 6 (six) hours as needed for moderate pain. 90 tablet 0  . levothyroxine (SYNTHROID, LEVOTHROID) 100 MCG tablet TAKE 1 TABLET BY MOUTH DAILY. 90 tablet 3  . losartan (COZAAR) 100 MG tablet TAKE 1 TABLET BY MOUTH DAILY. 90 tablet 3  . Multiple Vitamins-Minerals (CENTRUM PO) Take 1 tablet by mouth daily.    . Omega-3 Fatty Acids (FISH OIL PO) Take 1 tablet by mouth daily.    . traZODone (DESYREL) 100 MG tablet TAKE 1 TABLET BY MOUTH AT BEDTIME. 90 tablet 0  . verapamil (CALAN-SR) 240 MG CR tablet TAKE 1 TABLET BY MOUTH AT BEDTIME. 90 tablet 1  . warfarin (COUMADIN) 5 MG tablet TAKE 1 TABLET (5 MG TOTAL) BY MOUTH AS DIRECTED. 90 tablet 1   No current facility-administered medications on file prior to visit.    BP 130/70 mmHg  Pulse 73  Temp(Src) 98.4 F (36.9 C) (Oral)  Resp 18  Ht 5\' 2"  (1.575 m)  Wt 127 lb (57.607 kg)  BMI 23.22 kg/m2  SpO2 98%      Review of Systems  Constitutional: Negative.   HENT: Positive for hearing loss. Negative for congestion, dental problem, rhinorrhea, sinus pressure, sore throat and tinnitus.   Eyes: Negative for pain, discharge and visual disturbance.  Respiratory: Negative for cough and shortness of breath.   Cardiovascular: Negative for chest pain, palpitations and leg swelling.  Gastrointestinal: Negative for nausea, vomiting, abdominal pain, diarrhea, constipation, blood in stool and abdominal distention.  Genitourinary: Negative for dysuria, urgency, frequency, hematuria, flank pain, vaginal  bleeding, vaginal discharge, difficulty urinating, vaginal pain and pelvic pain.  Musculoskeletal: Positive for arthralgias. Negative for joint swelling and gait problem.  Skin: Negative for rash.  Neurological: Negative for dizziness, syncope, speech difficulty, weakness, numbness and headaches.  Hematological: Negative for adenopathy.  Psychiatric/Behavioral: Negative for behavioral problems, dysphoric mood and agitation. The patient is not nervous/anxious.        Objective:   Physical Exam  Constitutional: She is oriented to person, place, and time. She appears well-developed and well-nourished.  HENT:  Head: Normocephalic.  Right Ear: External ear normal.  Left Ear: External ear normal.  Mouth/Throat: Oropharynx is clear and moist.  Eyes: Conjunctivae and EOM are normal. Pupils are equal, round, and reactive to light.  Neck: Normal range of motion. Neck supple. No thyromegaly present.  Cardiovascular: Normal rate, regular rhythm, normal heart sounds and intact distal pulses.   Prominent prosthetic heart sounds.  No murmur  Pulmonary/Chest: Effort normal and breath sounds normal.  Status post sternotomy  Abdominal: Soft. Bowel sounds are normal. She exhibits no mass. There is no tenderness.  Musculoskeletal: Normal range of motion.  Marked osteoarthritic joint changes  Lymphadenopathy:    She has no cervical adenopathy.  Neurological: She is alert and oriented to person, place, and time.  Skin: Skin is warm and dry. No rash noted.  Psychiatric: She has a normal mood and affect. Her behavior is normal.          Assessment & Plan:   Advanced osteoarthritis Status post aVR.  Continue Coumadin anticoagulation Bilateral cerumen impactions.  Procedure note.  Both ears irrigated until clear Hypothyroidism  Medicines updated Schedule CPX Both ear canals irrigated

## 2014-11-09 NOTE — Patient Instructions (Signed)
Limit your sodium (Salt) intake  Return in 3 months for follow-up  Please check your blood pressure on a regular basis.  If it is consistently greater than 150/90, please make an office appointment.  Cardiology follow-up as scheduled  Monthly INR

## 2014-11-09 NOTE — Progress Notes (Signed)
Pre visit review using our clinic review tool, if applicable. No additional management support is needed unless otherwise documented below in the visit note. 

## 2014-11-13 ENCOUNTER — Other Ambulatory Visit: Payer: Self-pay | Admitting: Internal Medicine

## 2014-11-23 DIAGNOSIS — H93293 Other abnormal auditory perceptions, bilateral: Secondary | ICD-10-CM | POA: Diagnosis not present

## 2014-11-23 DIAGNOSIS — H6123 Impacted cerumen, bilateral: Secondary | ICD-10-CM | POA: Diagnosis not present

## 2014-11-23 DIAGNOSIS — H61893 Other specified disorders of external ear, bilateral: Secondary | ICD-10-CM | POA: Diagnosis not present

## 2014-11-25 ENCOUNTER — Ambulatory Visit (INDEPENDENT_AMBULATORY_CARE_PROVIDER_SITE_OTHER): Payer: Medicare Other | Admitting: Pharmacist

## 2014-11-25 DIAGNOSIS — I359 Nonrheumatic aortic valve disorder, unspecified: Secondary | ICD-10-CM

## 2014-11-25 DIAGNOSIS — Z954 Presence of other heart-valve replacement: Secondary | ICD-10-CM

## 2014-11-25 DIAGNOSIS — Z952 Presence of prosthetic heart valve: Secondary | ICD-10-CM

## 2014-11-25 LAB — POCT INR: INR: 2.8

## 2014-12-02 DIAGNOSIS — S92351A Displaced fracture of fifth metatarsal bone, right foot, initial encounter for closed fracture: Secondary | ICD-10-CM | POA: Diagnosis not present

## 2014-12-02 DIAGNOSIS — M25571 Pain in right ankle and joints of right foot: Secondary | ICD-10-CM | POA: Diagnosis not present

## 2014-12-09 ENCOUNTER — Telehealth: Payer: Self-pay | Admitting: Internal Medicine

## 2014-12-09 NOTE — Telephone Encounter (Addendum)
Pt did sign controlled substance contact with [place]  . Pt had fracture foot and dr Delilah Shan gave her rx for percocet. Pt states she can not take med. Pt said any question please call her

## 2014-12-11 ENCOUNTER — Telehealth: Payer: Self-pay | Admitting: Internal Medicine

## 2014-12-11 MED ORDER — HYDROCODONE-ACETAMINOPHEN 7.5-325 MG PO TABS: 1.0000 | ORAL_TABLET | Freq: Four times a day (QID) | ORAL | Status: DC | PRN

## 2014-12-11 NOTE — Telephone Encounter (Signed)
Spoke to pt, told her that is okay sometimes when pt have surgery they are given different type of pain medication and if you can take it just use your Hydrocodone then instead. Pt verbalized understanding just didn't want to get in trouble and she needs a refill on my Hydrocodone. Told pt okay Rx will be ready for pick up later today. Pt verbalized understanding. Rx printed and signed.

## 2014-12-11 NOTE — Telephone Encounter (Signed)
Spoke to pt, asked her if she called again? Pt said no, told pt I have Rx ready and if she would bring in the percocet Dr. Raliegh Ip can dispose of it. Pt verbalized understanding and stated daughter will pickup Rx on Monday and will send medication with her. Told her okay.

## 2014-12-11 NOTE — Telephone Encounter (Signed)
Pt calling to request refill of HYDROcodone-acetaminophen (NORCO) 7.5-325 MG tablet.  States she did not take any of the percocet she was prescribed by Dr. Delilah Shan for her foot.  Pt states if she needs to bring those pills here she would be more than happy to.

## 2014-12-15 DIAGNOSIS — S99921D Unspecified injury of right foot, subsequent encounter: Secondary | ICD-10-CM | POA: Diagnosis not present

## 2014-12-15 DIAGNOSIS — S92351D Displaced fracture of fifth metatarsal bone, right foot, subsequent encounter for fracture with routine healing: Secondary | ICD-10-CM | POA: Diagnosis not present

## 2014-12-15 NOTE — Telephone Encounter (Signed)
Pt came by to pickup Rx Hydrocodone with her daughter, told by Danae Chen pt said she wanted to speak with me because she does not want to hand over Rx Percocet because she paid for it. Discussed situation with Dr.K and he said being as pt is due for Hydrocodone Rx can give it to her and we can not force her to give the medication Percocet to be disposed of. Cori Razor she can give pt the Rx that is upfront. Erica verbalized understanding and said pt is aware not to take together.

## 2014-12-23 ENCOUNTER — Ambulatory Visit (INDEPENDENT_AMBULATORY_CARE_PROVIDER_SITE_OTHER): Payer: Medicare Other | Admitting: *Deleted

## 2014-12-23 DIAGNOSIS — Z952 Presence of prosthetic heart valve: Secondary | ICD-10-CM

## 2014-12-23 DIAGNOSIS — Z954 Presence of other heart-valve replacement: Secondary | ICD-10-CM

## 2014-12-23 DIAGNOSIS — I359 Nonrheumatic aortic valve disorder, unspecified: Secondary | ICD-10-CM | POA: Diagnosis not present

## 2014-12-23 LAB — POCT INR: INR: 2.5

## 2014-12-25 DIAGNOSIS — H1132 Conjunctival hemorrhage, left eye: Secondary | ICD-10-CM | POA: Diagnosis not present

## 2015-01-05 DIAGNOSIS — S92351D Displaced fracture of fifth metatarsal bone, right foot, subsequent encounter for fracture with routine healing: Secondary | ICD-10-CM | POA: Diagnosis not present

## 2015-01-07 ENCOUNTER — Other Ambulatory Visit: Payer: Self-pay | Admitting: Internal Medicine

## 2015-01-22 ENCOUNTER — Ambulatory Visit (INDEPENDENT_AMBULATORY_CARE_PROVIDER_SITE_OTHER): Payer: Medicare Other | Admitting: *Deleted

## 2015-01-22 DIAGNOSIS — I359 Nonrheumatic aortic valve disorder, unspecified: Secondary | ICD-10-CM | POA: Diagnosis not present

## 2015-01-22 DIAGNOSIS — Z954 Presence of other heart-valve replacement: Secondary | ICD-10-CM

## 2015-01-22 DIAGNOSIS — Z952 Presence of prosthetic heart valve: Secondary | ICD-10-CM

## 2015-01-22 LAB — POCT INR: INR: 2.3

## 2015-01-28 ENCOUNTER — Telehealth: Payer: Self-pay | Admitting: Internal Medicine

## 2015-01-28 NOTE — Telephone Encounter (Signed)
° ° °  Pt request refill of the following: ° °HYDROcodone-acetaminophen (NORCO) 7.5-325 MG tablet ° ° °Phamacy: °

## 2015-01-29 MED ORDER — HYDROCODONE-ACETAMINOPHEN 7.5-325 MG PO TABS: 1.0000 | ORAL_TABLET | Freq: Four times a day (QID) | ORAL | Status: DC | PRN

## 2015-01-29 NOTE — Telephone Encounter (Signed)
Pt last office visit 11/09/14 Pt last Rx refill 12/11/14 #120 NO refill    Please advise

## 2015-01-29 NOTE — Telephone Encounter (Signed)
Ok to rf? 

## 2015-01-29 NOTE — Telephone Encounter (Signed)
Rx is ready for pick up.

## 2015-02-02 DIAGNOSIS — S92351D Displaced fracture of fifth metatarsal bone, right foot, subsequent encounter for fracture with routine healing: Secondary | ICD-10-CM | POA: Diagnosis not present

## 2015-02-26 ENCOUNTER — Ambulatory Visit (INDEPENDENT_AMBULATORY_CARE_PROVIDER_SITE_OTHER): Payer: Medicare Other

## 2015-02-26 DIAGNOSIS — Z954 Presence of other heart-valve replacement: Secondary | ICD-10-CM | POA: Diagnosis not present

## 2015-02-26 DIAGNOSIS — Z952 Presence of prosthetic heart valve: Secondary | ICD-10-CM

## 2015-02-26 DIAGNOSIS — I359 Nonrheumatic aortic valve disorder, unspecified: Secondary | ICD-10-CM

## 2015-02-26 LAB — POCT INR: INR: 2.4

## 2015-03-03 DIAGNOSIS — S92351D Displaced fracture of fifth metatarsal bone, right foot, subsequent encounter for fracture with routine healing: Secondary | ICD-10-CM | POA: Diagnosis not present

## 2015-03-04 ENCOUNTER — Other Ambulatory Visit: Payer: Self-pay | Admitting: Sports Medicine

## 2015-03-04 DIAGNOSIS — S92351D Displaced fracture of fifth metatarsal bone, right foot, subsequent encounter for fracture with routine healing: Secondary | ICD-10-CM

## 2015-03-19 ENCOUNTER — Ambulatory Visit
Admission: RE | Admit: 2015-03-19 | Discharge: 2015-03-19 | Disposition: A | Discharge status: Home / Self Care | Payer: Medicare Other | Source: Ambulatory Visit | Attending: Sports Medicine | Admitting: Sports Medicine

## 2015-03-19 DIAGNOSIS — M19071 Primary osteoarthritis, right ankle and foot: Secondary | ICD-10-CM | POA: Diagnosis not present

## 2015-03-19 DIAGNOSIS — S92351D Displaced fracture of fifth metatarsal bone, right foot, subsequent encounter for fracture with routine healing: Secondary | ICD-10-CM

## 2015-03-29 ENCOUNTER — Telehealth: Payer: Self-pay | Admitting: Internal Medicine

## 2015-03-29 MED ORDER — HYDROCODONE-ACETAMINOPHEN 7.5-325 MG PO TABS: 1.0000 | ORAL_TABLET | Freq: Four times a day (QID) | ORAL | Status: DC | PRN

## 2015-03-29 NOTE — Telephone Encounter (Signed)
Pt notified Rx ready for pickup. Rx printed and signed.  

## 2015-03-29 NOTE — Telephone Encounter (Signed)
Pt needs a refill on her hydrocodone please

## 2015-03-30 DIAGNOSIS — R8290 Unspecified abnormal findings in urine: Secondary | ICD-10-CM | POA: Diagnosis not present

## 2015-03-30 DIAGNOSIS — Z0000 Encounter for general adult medical examination without abnormal findings: Secondary | ICD-10-CM | POA: Diagnosis not present

## 2015-04-01 ENCOUNTER — Telehealth: Payer: Self-pay

## 2015-04-01 NOTE — Telephone Encounter (Signed)
Requesting surgical clearance:   1. Type of surgery: 2 surgical extractions and 8 routine extractions  2. Surgeon: Perlie Gold, DDS  3. Surgical date: pending clearance  4. Medications that need to be held: pt hx of aortic valve replacement - 2003  5. CAD: No     6. I will defer to: [facility] Attn: Dr. Allyson Sabal [phone] phone [phone] fax

## 2015-04-04 NOTE — Telephone Encounter (Signed)
Hold coumadin 5 days prior to extraction and resume maintenance dose on same evening with INR 3 days later.

## 2015-04-05 NOTE — Telephone Encounter (Signed)
Clearance place on MR nurse fax box to be faxed to pt DDS. Update fwd to [facility]

## 2015-04-05 NOTE — Telephone Encounter (Signed)
Pt states she has decided that she is not going to have the dental extractions as mentioned on the clearance form and this nurse informed Lattie Haw Dr Thompson Caul CMA of this and she states will notify Dr Tamala Julian. Also informed pt that she needs to inform her dentist Dr Allyson Sabal that she is cancelling procedure and she states will do so .

## 2015-04-12 DIAGNOSIS — M25562 Pain in left knee: Secondary | ICD-10-CM | POA: Diagnosis not present

## 2015-04-12 DIAGNOSIS — M25561 Pain in right knee: Secondary | ICD-10-CM | POA: Diagnosis not present

## 2015-04-16 ENCOUNTER — Ambulatory Visit (INDEPENDENT_AMBULATORY_CARE_PROVIDER_SITE_OTHER): Payer: Medicare Other | Admitting: *Deleted

## 2015-04-16 DIAGNOSIS — Z954 Presence of other heart-valve replacement: Secondary | ICD-10-CM

## 2015-04-16 DIAGNOSIS — I359 Nonrheumatic aortic valve disorder, unspecified: Secondary | ICD-10-CM | POA: Diagnosis not present

## 2015-04-16 DIAGNOSIS — Z952 Presence of prosthetic heart valve: Secondary | ICD-10-CM

## 2015-04-16 LAB — POCT INR: INR: 2.9

## 2015-04-21 ENCOUNTER — Other Ambulatory Visit: Payer: Self-pay | Admitting: Internal Medicine

## 2015-04-30 ENCOUNTER — Other Ambulatory Visit: Payer: Self-pay | Admitting: Internal Medicine

## 2015-05-10 ENCOUNTER — Other Ambulatory Visit: Payer: Self-pay | Admitting: Internal Medicine

## 2015-05-25 ENCOUNTER — Telehealth: Payer: Self-pay | Admitting: Internal Medicine

## 2015-05-25 NOTE — Telephone Encounter (Signed)
Pt request refill  HYDROcodone-acetaminophen (NORCO) 7.5-325 MG tablet  Pt states she will make follow up appointment in about a week. Pt still not driving and recovering from broken foot.

## 2015-05-26 MED ORDER — HYDROCODONE-ACETAMINOPHEN 7.5-325 MG PO TABS: 1.0000 | ORAL_TABLET | Freq: Four times a day (QID) | ORAL | Status: DC | PRN

## 2015-05-26 NOTE — Telephone Encounter (Signed)
Pt notified Rx ready for pickup. Rx printed and signed.  

## 2015-05-28 ENCOUNTER — Ambulatory Visit (INDEPENDENT_AMBULATORY_CARE_PROVIDER_SITE_OTHER): Payer: Medicare Other | Admitting: *Deleted

## 2015-05-28 DIAGNOSIS — I359 Nonrheumatic aortic valve disorder, unspecified: Secondary | ICD-10-CM | POA: Diagnosis not present

## 2015-05-28 DIAGNOSIS — Z954 Presence of other heart-valve replacement: Secondary | ICD-10-CM

## 2015-05-28 DIAGNOSIS — Z952 Presence of prosthetic heart valve: Secondary | ICD-10-CM

## 2015-05-28 LAB — POCT INR: INR: 1.9

## 2015-06-25 DIAGNOSIS — S99921A Unspecified injury of right foot, initial encounter: Secondary | ICD-10-CM | POA: Diagnosis not present

## 2015-06-25 DIAGNOSIS — M25571 Pain in right ankle and joints of right foot: Secondary | ICD-10-CM | POA: Diagnosis not present

## 2015-06-28 ENCOUNTER — Ambulatory Visit: Payer: Medicare Other | Admitting: Cardiology

## 2015-07-01 ENCOUNTER — Other Ambulatory Visit: Payer: Self-pay | Admitting: Internal Medicine

## 2015-07-14 ENCOUNTER — Ambulatory Visit: Payer: Medicare Other | Admitting: Internal Medicine

## 2015-07-21 ENCOUNTER — Ambulatory Visit (INDEPENDENT_AMBULATORY_CARE_PROVIDER_SITE_OTHER): Payer: Medicare Other | Admitting: Internal Medicine

## 2015-07-21 ENCOUNTER — Encounter: Payer: Self-pay | Admitting: Internal Medicine

## 2015-07-21 VITALS — BP 110/64 | HR 79 | Temp 98.3°F | Resp 20 | Ht 62.0 in | Wt 125.0 lb

## 2015-07-21 DIAGNOSIS — Z954 Presence of other heart-valve replacement: Secondary | ICD-10-CM | POA: Diagnosis not present

## 2015-07-21 DIAGNOSIS — Z5181 Encounter for therapeutic drug level monitoring: Secondary | ICD-10-CM | POA: Diagnosis not present

## 2015-07-21 DIAGNOSIS — I10 Essential (primary) hypertension: Secondary | ICD-10-CM | POA: Diagnosis not present

## 2015-07-21 DIAGNOSIS — I2510 Atherosclerotic heart disease of native coronary artery without angina pectoris: Secondary | ICD-10-CM | POA: Diagnosis not present

## 2015-07-21 DIAGNOSIS — Z7901 Long term (current) use of anticoagulants: Secondary | ICD-10-CM

## 2015-07-21 DIAGNOSIS — Z952 Presence of prosthetic heart valve: Secondary | ICD-10-CM

## 2015-07-21 MED ORDER — VERAPAMIL HCL ER 240 MG PO TBCR: 120.0000 mg | EXTENDED_RELEASE_TABLET | Freq: Every day | ORAL | Status: DC

## 2015-07-21 MED ORDER — AMOXICILLIN 500 MG PO CAPS: 500.0000 mg | ORAL_CAPSULE | Freq: Three times a day (TID) | ORAL | Status: DC

## 2015-07-21 MED ORDER — AMOXICILLIN 500 MG PO CAPS: ORAL_CAPSULE | ORAL | Status: DC

## 2015-07-21 NOTE — Progress Notes (Signed)
Subjective:    Patient ID: Paula Ferguson, female    DOB: 10/25/1940, 75 y.o.   MRN: [id number]  HPI  75 year old patient who has a history of aortic valve replacement on chronic Coumadin anticoagulation.  She has essential hypertension. She has had some increase in anxiety in part related to dental issues.  She apparently will need multiple dental extractions.  She is having some dental pain and gingivitis area She's had some episodes of weakness and anxiety.  Episodes are at times associated with outdoor exposure.  Blood pressure today is in a low-normal range  Past Medical History  Diagnosis Date  . ANEMIA 09/15/2009  . HYPERLIPIDEMIA 02/07/2007  . HYPERTENSION 09/14/2006  . HYPOTHYROIDISM 09/14/2006  . LIVER FUNCTION TESTS, ABNORMAL, HX OF 01/12/2009  . OLECRANON BURSITIS, LEFT 08/26/2007  . Blood transfusion 2000's; 11/17/2011    S/P "one of my other shoulder surgeries"; S/P left shoulder replacement  . CORONARY ARTERY DISEASE 08/26/2007    LOV Dr Tamala Julian 1/13 with EKG and clearance on chart  . Pneumonia     hx  . Heart murmur     hx- surgery  . OSTEOARTHRITIS 02/07/2007    "thumbs; knees; shoulders; ?back"  . PTSD (post-traumatic stress disorder) 1976  . Depression      Social History   Social History  . Marital Status: Widowed    Spouse Name: N/A  . Number of Children: N/A  . Years of Education: N/A   Occupational History  . Not on file.   Social History Main Topics  . Smoking status: Never Smoker   . Smokeless tobacco: Never Used  . Alcohol Use: No  . Drug Use: No  . Sexual Activity: Not Currently   Other Topics Concern  . Not on file   Social History Narrative    Past Surgical History  Procedure Laterality Date  . Joint replacement      right shoulder arthroscopy, left partial knee replacement  . Colonoscopy    . Total knee arthroplasty  03/21/2011    Procedure: TOTAL KNEE ARTHROPLASTY;  Surgeon: Mauri Pole, MD;  Location: [facility];  Service:  Orthopedics;  Laterality: Right;  . Total shoulder replacement  2012; 11/17/2011    right; left  . Tonsillectomy and adenoidectomy  ~ 1964  . Appendectomy  1981  . Abdominal hysterectomy  1981  . Cholecystectomy  1990's  . Knee arthroscopy  1980's; 2012    left; right  . Partial knee arthroplasty  1990's    left  . Transfer / transplant ankle tendon superficial / deep  1990's    "bunch of surgeries after dr ruptured posterior tibial tendon", right foot  . Carpal tunnel release  1990's    bilaterally  . Shoulder arthroscopy  2000's    right; "3 surgeries";  Marland Kitchen Shoulder arthroscopy w/ rotator cuff repair  1990's  . Cardiac valve replacement  2003    "aortic"  . Coronary artery bypass graft  2003    CABG X1 "mammary" w/aortic valve replacement   . Cardiac catheterization  2003  . Dilation and curettage of uterus  1981?  Marland Kitchen Fracture surgery    . Exploratory laparotomy  ~ 1981  . Steriod injection  11/17/2011    Procedure: STEROID INJECTION;  Surgeon: Augustin Schooling, MD;  Location: [facility];  Service: Orthopedics;  Laterality: Right;  cortizone injection right thumb  . Reverse shoulder arthroplasty  11/17/2011    Procedure: REVERSE SHOULDER ARTHROPLASTY;  Surgeon: Augustin Schooling,  MD;  Location: [facility];  Service: Orthopedics;  Laterality: Left;  left reverse shoulder arthroplasty  . V43.3      heart valve    Family History  Problem Relation Age of Onset  . Lung disease Mother   . Lung disease Father   . Healthy Sister   . Kidney disease Brother     Allergies  Allergen Reactions  . Hydromorphone Hcl Itching    "Dilaudid"  . Meperidine Hcl Itching  . Morphine Sulfate Itching    Current Outpatient Prescriptions on File Prior to Visit  Medication Sig Dispense Refill  . amoxicillin (AMOXIL) 500 MG capsule 2 tablets 1 hour before dental procedure and an additional 2 tablets 6 hours later 8 capsule 0  . BIOTIN PO Take 1 tablet by mouth daily.    Marland Kitchen buPROPion (WELLBUTRIN SR) 150 MG 12  hr tablet TAKE 1 TABLET BY MOUTH DAILY. 90 tablet 1  . Calcium Citrate-Vitamin D (CITRACAL PETITES/VITAMIN D PO) Take 1 tablet by mouth daily.     . cholecalciferol (VITAMIN D) 1000 UNITS tablet Take 1,000 Units by mouth daily.    . Cyanocobalamin (VITAMIN B-12 PO) Take 1 tablet by mouth daily.    . ferrous sulfate 325 (65 FE) MG tablet Take 325 mg by mouth daily with breakfast.     . HYDROcodone-acetaminophen (NORCO) 7.5-325 MG tablet Take 1 tablet by mouth every 6 (six) hours as needed for moderate pain. 120 tablet 0  . levothyroxine (SYNTHROID, LEVOTHROID) 100 MCG tablet TAKE 1 TABLET BY MOUTH DAILY. 90 tablet 3  . losartan (COZAAR) 100 MG tablet TAKE 1 TABLET BY MOUTH DAILY. 90 tablet 3  . methocarbamol (ROBAXIN) 500 MG tablet TAKE 1 TABLET BY MOUTH 3 TIMES A DAY AS NEEDED. 60 tablet 2  . Multiple Vitamins-Minerals (CENTRUM PO) Take 1 tablet by mouth daily.    . Omega-3 Fatty Acids (FISH OIL PO) Take 1 tablet by mouth daily.    . traZODone (DESYREL) 100 MG tablet TAKE 1 TABLET (100 MG TOTAL) BY MOUTH AT BEDTIME AS NEEDED FOR SLEEP. 90 tablet 1  . verapamil (CALAN-SR) 240 MG CR tablet TAKE 1 TABLET BY MOUTH AT BEDTIME. 90 tablet 1  . warfarin (COUMADIN) 5 MG tablet TAKE 1 TABLET (5 MG TOTAL) BY MOUTH AS DIRECTED. 90 tablet 1   No current facility-administered medications on file prior to visit.    BP 110/64 mmHg  Pulse 79  Temp(Src) 98.3 F (36.8 C) (Oral)  Resp 20  Ht 5\' 2"  (1.575 m)  Wt 125 lb (56.7 kg)  BMI 22.86 kg/m2  SpO2 98%     Review of Systems  Constitutional: Positive for activity change and fatigue.  HENT: Positive for mouth sores. Negative for congestion, dental problem, hearing loss, rhinorrhea, sinus pressure, sore throat and tinnitus.   Eyes: Negative for pain, discharge and visual disturbance.  Respiratory: Negative for cough and shortness of breath.   Cardiovascular: Negative for chest pain, palpitations and leg swelling.  Gastrointestinal: Negative for  nausea, vomiting, abdominal pain, diarrhea, constipation, blood in stool and abdominal distention.  Genitourinary: Negative for dysuria, urgency, frequency, hematuria, flank pain, vaginal bleeding, vaginal discharge, difficulty urinating, vaginal pain and pelvic pain.  Musculoskeletal: Negative for joint swelling, arthralgias and gait problem.  Skin: Negative for rash.  Neurological: Positive for light-headedness. Negative for dizziness, syncope, speech difficulty, weakness, numbness and headaches.  Hematological: Negative for adenopathy.  Psychiatric/Behavioral: Negative for behavioral problems, dysphoric mood and agitation. The patient is not nervous/anxious.  Objective:   Physical Exam  Constitutional: She is oriented to person, place, and time. She appears well-developed and well-nourished.  Blood pressure 90/60  HENT:  Head: Normocephalic.  Right Ear: External ear normal.  Left Ear: External ear normal.  Mouth/Throat: Oropharynx is clear and moist.  Gingivitis involving the right upper gumline  Eyes: Conjunctivae and EOM are normal. Pupils are equal, round, and reactive to light.  Neck: Normal range of motion. Neck supple. No thyromegaly present.  Cardiovascular: Normal rate, regular rhythm and intact distal pulses.   Prosthetic heart sounds.  No murmur.  Normal rate  Pulmonary/Chest: Effort normal and breath sounds normal.  Abdominal: Soft. Bowel sounds are normal. She exhibits no mass. There is no tenderness.  Musculoskeletal: Normal range of motion.  Lymphadenopathy:    She has no cervical adenopathy.  Neurological: She is alert and oriented to person, place, and time.  Skin: Skin is warm and dry. No rash noted.  Psychiatric: She has a normal mood and affect. Her behavior is normal.          Assessment & Plan:   Hypotension.  Will decrease antihypertensive regimen Anxiety.  Doubt this represents a true panic attack and seems be precipitated by various activities  and outdoor exposure Essential hypertension.  We'll down titrate medications Status post aVR with chronic Coumadin anticoagulation.  Will need Lovenox bridging prior to dental extractions  Nyoka Cowden, MD

## 2015-07-21 NOTE — Progress Notes (Signed)
Pre visit review using our clinic review tool, if applicable. No additional management support is needed unless otherwise documented below in the visit note. 

## 2015-07-21 NOTE — Patient Instructions (Signed)
Take your antibiotic as prescribed until ALL of it is gone, but stop if you develop a rash, swelling, or any side effects of the medication.  Contact our office as soon as possible if  there are side effects of the medication.  Decreased Verapamil  to 120 mg daily  Please check your blood pressure on a regular basis.  If it is consistently greater than 150/90, please make an office appointment.  Return in 4 weeks for follow-up

## 2015-07-30 ENCOUNTER — Ambulatory Visit (INDEPENDENT_AMBULATORY_CARE_PROVIDER_SITE_OTHER): Payer: Medicare Other | Admitting: *Deleted

## 2015-07-30 DIAGNOSIS — Z954 Presence of other heart-valve replacement: Secondary | ICD-10-CM

## 2015-07-30 DIAGNOSIS — Z952 Presence of prosthetic heart valve: Secondary | ICD-10-CM

## 2015-07-30 DIAGNOSIS — I359 Nonrheumatic aortic valve disorder, unspecified: Secondary | ICD-10-CM

## 2015-07-30 LAB — POCT INR: INR: 2

## 2015-07-30 LAB — BASIC METABOLIC PANEL
BUN: 14 mg/dL (ref 7–25)
CHLORIDE: 105 mmol/L (ref 98–110)
CO2: 29 mmol/L (ref 20–31)
CREATININE: 1.07 mg/dL — AB (ref 0.60–0.93)
Calcium: 9.4 mg/dL (ref 8.6–10.4)
GLUCOSE: 84 mg/dL (ref 65–99)
Potassium: 4 mmol/L (ref 3.5–5.3)
Sodium: 141 mmol/L (ref 135–146)

## 2015-08-06 ENCOUNTER — Other Ambulatory Visit: Payer: Self-pay | Admitting: Internal Medicine

## 2015-08-06 NOTE — Telephone Encounter (Signed)
Okay for refill?  

## 2015-08-06 NOTE — Telephone Encounter (Signed)
Okay to refill Amoxicillin?

## 2015-08-17 ENCOUNTER — Telehealth: Payer: Self-pay | Admitting: Internal Medicine

## 2015-08-17 NOTE — Telephone Encounter (Signed)
Pt needs new rx hydrocodone °

## 2015-08-18 MED ORDER — HYDROCODONE-ACETAMINOPHEN 7.5-325 MG PO TABS: 1.0000 | ORAL_TABLET | Freq: Four times a day (QID) | ORAL | Status: DC | PRN

## 2015-08-18 NOTE — Telephone Encounter (Signed)
Pt notified Rx ready for pickup. Rx printed and signed.  

## 2015-08-20 ENCOUNTER — Telehealth (HOSPITAL_COMMUNITY): Payer: Self-pay | Admitting: Interventional Cardiology

## 2015-08-25 NOTE — Telephone Encounter (Signed)
Close encounter 

## 2015-08-27 ENCOUNTER — Ambulatory Visit (INDEPENDENT_AMBULATORY_CARE_PROVIDER_SITE_OTHER): Payer: Medicare Other | Admitting: *Deleted

## 2015-08-27 DIAGNOSIS — Z952 Presence of prosthetic heart valve: Secondary | ICD-10-CM

## 2015-08-27 DIAGNOSIS — I359 Nonrheumatic aortic valve disorder, unspecified: Secondary | ICD-10-CM | POA: Diagnosis not present

## 2015-08-27 DIAGNOSIS — Z954 Presence of other heart-valve replacement: Secondary | ICD-10-CM

## 2015-08-27 LAB — POCT INR: INR: 2.2

## 2015-09-02 ENCOUNTER — Telehealth: Payer: Self-pay | Admitting: *Deleted

## 2015-09-02 NOTE — Telephone Encounter (Signed)
-----   Message from Belva Crome, MD sent at 09/01/2015  6:08 PM EDT ----- She needs endocarditis prophylaxis. If the oral surgeon is able to extract the teeth while on coumadin, I would proceed based on that instruction. We need to be sure the surgeon is making this decision and not a clerical person. I would think coumadin should be bridged as before.  Please speak directly to the Oral Surgeon! ----- Message -----    From: Margretta Sidle, RN    Sent: 08/27/2015   2:29 PM      To: Belva Crome, MD, Lamar Laundry, CMA  Dr Elliot Cousin Mrs Forest in coumadin clinic on July 7th and she is scheduled for extraction of 10 teeth on July 21st . We called Dr Aileen Pilot [facility] and the office stated that they did not hold pt's medications . We re- emphasized that she was on coumadin and they continued to state did not hold medication  Mrs Beagan states that in the past when she has been off coumadin she was on Lovenox  Please Advise  Plan to see her again on July 14th Thank You Elbert Ewings RN

## 2015-09-02 NOTE — Telephone Encounter (Signed)
Spoke with [facility] and they are to talk again with Dr Katheren Shams and will call back with further information

## 2015-09-03 ENCOUNTER — Ambulatory Visit (INDEPENDENT_AMBULATORY_CARE_PROVIDER_SITE_OTHER): Payer: Medicare Other | Admitting: Pharmacist

## 2015-09-03 DIAGNOSIS — Z954 Presence of other heart-valve replacement: Secondary | ICD-10-CM

## 2015-09-03 DIAGNOSIS — Z952 Presence of prosthetic heart valve: Secondary | ICD-10-CM

## 2015-09-03 DIAGNOSIS — I359 Nonrheumatic aortic valve disorder, unspecified: Secondary | ICD-10-CM

## 2015-09-03 LAB — POCT INR: INR: 2

## 2015-09-03 NOTE — Telephone Encounter (Signed)
Received fax information from Dr Katheren Shams stating that he prefers the pt maintain their coumadin  regimen and have INR below 3.5 See scanned information regarding this   Will bring pt in to have INR checked day of procedure

## 2015-09-03 NOTE — Telephone Encounter (Signed)
-----   Message from Belva Crome, MD sent at 09/01/2015  6:08 PM EDT ----- She needs endocarditis prophylaxis. If the oral surgeon is able to extract the teeth while on coumadin, I would proceed based on that instruction. We need to be sure the surgeon is making this decision and not a clerical person. I would think coumadin should be bridged as before.  Please speak directly to the Oral Surgeon! ----- Message -----    From: Margretta Sidle, RN    Sent: 08/27/2015   2:29 PM      To: Belva Crome, MD, Lamar Laundry, CMA  Dr Elliot Cousin Mrs Shorb in coumadin clinic on July 7th and she is scheduled for extraction of 10 teeth on July 21st . We called Dr Aileen Pilot [facility] and the office stated that they did not hold pt's medications . We re- emphasized that she was on coumadin and they continued to state did not hold medication  Mrs Henao states that in the past when she has been off coumadin she was on Lovenox  Please Advise  Plan to see her again on July 14th Thank You Elbert Ewings RN

## 2015-09-03 NOTE — Telephone Encounter (Signed)
-----   Message from Belva Crome, MD sent at 09/01/2015  6:08 PM EDT ----- She needs endocarditis prophylaxis. If the oral surgeon is able to extract the teeth while on coumadin, I would proceed based on that instruction. We need to be sure the surgeon is making this decision and not a clerical person. I would think coumadin should be bridged as before.  Please speak directly to the Oral Surgeon! ----- Message -----    From: Margretta Sidle, RN    Sent: 08/27/2015   2:29 PM      To: Belva Crome, MD, Lamar Laundry, CMA  Dr Elliot Cousin Mrs Fluet in coumadin clinic on July 7th and she is scheduled for extraction of 10 teeth on July 21st . We called Dr Aileen Pilot [facility] and the office stated that they did not hold pt's medications . We re- emphasized that she was on coumadin and they continued to state did not hold medication  Mrs Fear states that in the past when she has been off coumadin she was on Lovenox  Please Advise  Plan to see her again on July 14th Thank You Elbert Ewings RN

## 2015-09-03 NOTE — Telephone Encounter (Signed)
-----   Message from Belva Crome, MD sent at 09/01/2015  6:08 PM EDT ----- She needs endocarditis prophylaxis. If the oral surgeon is able to extract the teeth while on coumadin, I would proceed based on that instruction. We need to be sure the surgeon is making this decision and not a clerical person. I would think coumadin should be bridged as before.  Please speak directly to the Oral Surgeon! ----- Message -----    From: Margretta Sidle, RN    Sent: 08/27/2015   2:29 PM      To: Belva Crome, MD, Lamar Laundry, CMA  Dr Elliot Cousin Paula Ferguson in coumadin clinic on July 7th and she is scheduled for extraction of 10 teeth on July 21st . We called Dr Aileen Pilot [facility] and the office stated that they did not hold pt's medications . We re- emphasized that she was on coumadin and they continued to state did not hold medication  Paula Ferguson states that in the past when she has been off coumadin she was on Lovenox  Please Advise  Plan to see her again on July 14th Thank You Elbert Ewings RN

## 2015-09-03 NOTE — Telephone Encounter (Signed)
Spoke with Kieth Brightly and she states that she did fax over statement from Dr Katheren Shams yesterday but did inform her that we did not get the fax regarding her instructions about coumadin for dental extractions on July 21st so she states will refax this now

## 2015-09-09 NOTE — Telephone Encounter (Signed)
Spoke with Kieth Brightly [place] at [facility] and faxed her the pt's INR results from 09/03/2015 and informed Kieth Brightly that we have pt scheduled for an INR check tomorrow July 21st before the dental procedure and will call or fax her the results of the INR  Also spoke again with Dr Thompson Caul CMA Lattie Haw who will speak with Dr Tamala Julian regarding this  today

## 2015-09-10 ENCOUNTER — Ambulatory Visit: Payer: Medicare Other | Admitting: Interventional Cardiology

## 2015-09-10 ENCOUNTER — Ambulatory Visit (INDEPENDENT_AMBULATORY_CARE_PROVIDER_SITE_OTHER): Payer: Medicare Other | Admitting: *Deleted

## 2015-09-10 DIAGNOSIS — Z954 Presence of other heart-valve replacement: Secondary | ICD-10-CM

## 2015-09-10 DIAGNOSIS — Z952 Presence of prosthetic heart valve: Secondary | ICD-10-CM

## 2015-09-10 DIAGNOSIS — I359 Nonrheumatic aortic valve disorder, unspecified: Secondary | ICD-10-CM

## 2015-09-10 LAB — POCT INR: INR: 2.7

## 2015-09-15 ENCOUNTER — Ambulatory Visit (INDEPENDENT_AMBULATORY_CARE_PROVIDER_SITE_OTHER): Payer: Medicare Other | Admitting: Pharmacist

## 2015-09-15 DIAGNOSIS — Z954 Presence of other heart-valve replacement: Secondary | ICD-10-CM

## 2015-09-15 DIAGNOSIS — I359 Nonrheumatic aortic valve disorder, unspecified: Secondary | ICD-10-CM

## 2015-09-15 DIAGNOSIS — Z952 Presence of prosthetic heart valve: Secondary | ICD-10-CM

## 2015-09-15 LAB — POCT INR: INR: 4.7

## 2015-09-17 ENCOUNTER — Telehealth: Payer: Self-pay | Admitting: Interventional Cardiology

## 2015-09-17 NOTE — Telephone Encounter (Signed)
Patient is on coumadin and followed by our coumadin clinic will route to them to address

## 2015-09-17 NOTE — Telephone Encounter (Signed)
Spoke with pt.  She had some bleeding last night and a little this morning after drinking orange juice.  Her INR was 4.7 on Wednesday. Okay to hold Coumadin again today and restart tomorrow.  Has appt to check INR on 8/4.

## 2015-09-17 NOTE — Telephone Encounter (Signed)
New Message  Pt daughter called requesting to speak with RN. pt  Daughter states pt was taken off blood thinner wed 7/26 and thurs 7/27. Pt daughter wants to know if It is okay to keep pt off of blood thinner today 7/28, so that the bleeding can completely stop and resume blood thinner on Saturday. Please call back to discuss

## 2015-09-23 ENCOUNTER — Ambulatory Visit (INDEPENDENT_AMBULATORY_CARE_PROVIDER_SITE_OTHER): Payer: Medicare Other | Admitting: *Deleted

## 2015-09-23 DIAGNOSIS — I359 Nonrheumatic aortic valve disorder, unspecified: Secondary | ICD-10-CM

## 2015-09-23 DIAGNOSIS — Z952 Presence of prosthetic heart valve: Secondary | ICD-10-CM

## 2015-09-23 DIAGNOSIS — Z954 Presence of other heart-valve replacement: Secondary | ICD-10-CM | POA: Diagnosis not present

## 2015-09-23 LAB — POCT INR: INR: 1.5

## 2015-09-24 ENCOUNTER — Other Ambulatory Visit: Payer: Self-pay | Admitting: Internal Medicine

## 2015-09-27 ENCOUNTER — Ambulatory Visit (HOSPITAL_COMMUNITY): Payer: Medicare Other | Attending: Cardiovascular Disease

## 2015-09-27 ENCOUNTER — Other Ambulatory Visit: Payer: Self-pay

## 2015-09-27 DIAGNOSIS — I351 Nonrheumatic aortic (valve) insufficiency: Secondary | ICD-10-CM | POA: Diagnosis not present

## 2015-09-27 DIAGNOSIS — Z952 Presence of prosthetic heart valve: Secondary | ICD-10-CM | POA: Diagnosis not present

## 2015-09-27 DIAGNOSIS — I071 Rheumatic tricuspid insufficiency: Secondary | ICD-10-CM | POA: Insufficient documentation

## 2015-09-27 DIAGNOSIS — I2510 Atherosclerotic heart disease of native coronary artery without angina pectoris: Secondary | ICD-10-CM | POA: Diagnosis not present

## 2015-09-27 DIAGNOSIS — Z954 Presence of other heart-valve replacement: Secondary | ICD-10-CM | POA: Diagnosis not present

## 2015-09-27 DIAGNOSIS — I119 Hypertensive heart disease without heart failure: Secondary | ICD-10-CM | POA: Insufficient documentation

## 2015-09-27 DIAGNOSIS — E785 Hyperlipidemia, unspecified: Secondary | ICD-10-CM | POA: Insufficient documentation

## 2015-09-27 DIAGNOSIS — I340 Nonrheumatic mitral (valve) insufficiency: Secondary | ICD-10-CM | POA: Insufficient documentation

## 2015-10-08 ENCOUNTER — Ambulatory Visit (INDEPENDENT_AMBULATORY_CARE_PROVIDER_SITE_OTHER): Payer: Medicare Other | Admitting: Pharmacist

## 2015-10-08 DIAGNOSIS — I359 Nonrheumatic aortic valve disorder, unspecified: Secondary | ICD-10-CM | POA: Diagnosis not present

## 2015-10-08 DIAGNOSIS — Z952 Presence of prosthetic heart valve: Secondary | ICD-10-CM

## 2015-10-08 DIAGNOSIS — Z954 Presence of other heart-valve replacement: Secondary | ICD-10-CM | POA: Diagnosis not present

## 2015-10-08 LAB — POCT INR: INR: 1.5

## 2015-10-14 DIAGNOSIS — L905 Scar conditions and fibrosis of skin: Secondary | ICD-10-CM | POA: Diagnosis not present

## 2015-10-14 DIAGNOSIS — L814 Other melanin hyperpigmentation: Secondary | ICD-10-CM | POA: Diagnosis not present

## 2015-10-14 DIAGNOSIS — L821 Other seborrheic keratosis: Secondary | ICD-10-CM | POA: Diagnosis not present

## 2015-10-14 DIAGNOSIS — D1801 Hemangioma of skin and subcutaneous tissue: Secondary | ICD-10-CM | POA: Diagnosis not present

## 2015-10-14 DIAGNOSIS — L570 Actinic keratosis: Secondary | ICD-10-CM | POA: Diagnosis not present

## 2015-10-14 DIAGNOSIS — L720 Epidermal cyst: Secondary | ICD-10-CM | POA: Diagnosis not present

## 2015-10-20 ENCOUNTER — Telehealth: Payer: Self-pay | Admitting: Internal Medicine

## 2015-10-20 NOTE — Telephone Encounter (Signed)
Pt need new rx hydrocodone °

## 2015-10-22 MED ORDER — HYDROCODONE-ACETAMINOPHEN 7.5-325 MG PO TABS: 1.0000 | ORAL_TABLET | Freq: Four times a day (QID) | ORAL | 0 refills | Status: DC | PRN

## 2015-10-22 NOTE — Telephone Encounter (Signed)
Pt notified Rx ready for pickup will be at the front desk. Rx printed and signed.

## 2015-10-26 ENCOUNTER — Ambulatory Visit (INDEPENDENT_AMBULATORY_CARE_PROVIDER_SITE_OTHER): Payer: Medicare Other | Admitting: *Deleted

## 2015-10-26 DIAGNOSIS — I359 Nonrheumatic aortic valve disorder, unspecified: Secondary | ICD-10-CM | POA: Diagnosis not present

## 2015-10-26 DIAGNOSIS — Z952 Presence of prosthetic heart valve: Secondary | ICD-10-CM

## 2015-10-26 DIAGNOSIS — Z954 Presence of other heart-valve replacement: Secondary | ICD-10-CM

## 2015-10-26 LAB — POCT INR: INR: 1.6

## 2015-11-09 ENCOUNTER — Ambulatory Visit (INDEPENDENT_AMBULATORY_CARE_PROVIDER_SITE_OTHER): Payer: Medicare Other | Admitting: *Deleted

## 2015-11-09 DIAGNOSIS — I359 Nonrheumatic aortic valve disorder, unspecified: Secondary | ICD-10-CM

## 2015-11-09 DIAGNOSIS — Z954 Presence of other heart-valve replacement: Secondary | ICD-10-CM | POA: Diagnosis not present

## 2015-11-09 DIAGNOSIS — Z952 Presence of prosthetic heart valve: Secondary | ICD-10-CM

## 2015-11-09 LAB — POCT INR: INR: 2.4

## 2015-11-11 ENCOUNTER — Telehealth: Payer: Self-pay | Admitting: Internal Medicine

## 2015-11-11 MED ORDER — AMOXICILLIN 500 MG PO CAPS: ORAL_CAPSULE | ORAL | 1 refills | Status: DC

## 2015-11-11 NOTE — Telephone Encounter (Signed)
See message and advise if okay to refill Amoxicillin?

## 2015-11-11 NOTE — Telephone Encounter (Signed)
Spoke to pt, told her Rx for Amoxicillin sent to pharmacy. Take 4 tablets one hour prior to dental procedure. Pt verbalized understanding.

## 2015-11-11 NOTE — Telephone Encounter (Signed)
Yes.  Okay to refill Amoxicillin 500 mg #4  4 tablets one hour prior to dental procedure.  Refill times 1

## 2015-11-11 NOTE — Telephone Encounter (Signed)
Pt would like to have Rx amoxicillin due to her getting ready to have dental work done 12/17/15.  Pharm:  [facility] [phone].

## 2015-11-17 ENCOUNTER — Ambulatory Visit: Payer: Medicare Other | Admitting: Interventional Cardiology

## 2015-11-30 ENCOUNTER — Ambulatory Visit (INDEPENDENT_AMBULATORY_CARE_PROVIDER_SITE_OTHER): Payer: Medicare Other | Admitting: *Deleted

## 2015-11-30 DIAGNOSIS — Z952 Presence of prosthetic heart valve: Secondary | ICD-10-CM | POA: Diagnosis not present

## 2015-11-30 DIAGNOSIS — I359 Nonrheumatic aortic valve disorder, unspecified: Secondary | ICD-10-CM

## 2015-11-30 LAB — POCT INR: INR: 2.2

## 2015-12-01 DIAGNOSIS — M25562 Pain in left knee: Secondary | ICD-10-CM | POA: Diagnosis not present

## 2015-12-01 DIAGNOSIS — Z96652 Presence of left artificial knee joint: Secondary | ICD-10-CM | POA: Diagnosis not present

## 2015-12-01 DIAGNOSIS — M25561 Pain in right knee: Secondary | ICD-10-CM | POA: Diagnosis not present

## 2015-12-01 DIAGNOSIS — M1712 Unilateral primary osteoarthritis, left knee: Secondary | ICD-10-CM | POA: Diagnosis not present

## 2015-12-10 ENCOUNTER — Telehealth: Payer: Self-pay | Admitting: Internal Medicine

## 2015-12-10 MED ORDER — HYDROCODONE-ACETAMINOPHEN 7.5-325 MG PO TABS: 1.0000 | ORAL_TABLET | Freq: Four times a day (QID) | ORAL | 0 refills | Status: DC | PRN

## 2015-12-10 NOTE — Telephone Encounter (Signed)
Pt need new Rx for Hydrocodone °

## 2015-12-10 NOTE — Telephone Encounter (Signed)
Spoke to pt, told her Rx ready for pickup, but Dr.K needs to see her. Pt verbalized understanding and stated she is having problems and needs to be seen anyway. Told her okay can you come in on Monday at 2:30 or 4:15 PM? Pt stated 2:30. Told her okay appt scheduled see you Monday at 2:30PM with Dr. Raliegh Ip Pt verbalized understanding. Rx printed and signed.

## 2015-12-13 ENCOUNTER — Encounter: Payer: Self-pay | Admitting: Internal Medicine

## 2015-12-13 ENCOUNTER — Ambulatory Visit (INDEPENDENT_AMBULATORY_CARE_PROVIDER_SITE_OTHER): Payer: Medicare Other | Admitting: Internal Medicine

## 2015-12-13 VITALS — BP 140/70 | HR 81 | Temp 98.2°F | Resp 20 | Ht 62.0 in | Wt 123.4 lb

## 2015-12-13 DIAGNOSIS — Z952 Presence of prosthetic heart valve: Secondary | ICD-10-CM | POA: Diagnosis not present

## 2015-12-13 DIAGNOSIS — M150 Primary generalized (osteo)arthritis: Secondary | ICD-10-CM | POA: Diagnosis not present

## 2015-12-13 DIAGNOSIS — I2510 Atherosclerotic heart disease of native coronary artery without angina pectoris: Secondary | ICD-10-CM | POA: Diagnosis not present

## 2015-12-13 DIAGNOSIS — E785 Hyperlipidemia, unspecified: Secondary | ICD-10-CM

## 2015-12-13 DIAGNOSIS — M159 Polyosteoarthritis, unspecified: Secondary | ICD-10-CM

## 2015-12-13 DIAGNOSIS — E032 Hypothyroidism due to medicaments and other exogenous substances: Secondary | ICD-10-CM | POA: Diagnosis not present

## 2015-12-13 DIAGNOSIS — I10 Essential (primary) hypertension: Secondary | ICD-10-CM

## 2015-12-13 DIAGNOSIS — Z23 Encounter for immunization: Secondary | ICD-10-CM

## 2015-12-13 DIAGNOSIS — Z79891 Long term (current) use of opiate analgesic: Secondary | ICD-10-CM | POA: Diagnosis not present

## 2015-12-13 MED ORDER — BUPROPION HCL ER (SR) 150 MG PO TB12: 150.0000 mg | ORAL_TABLET | Freq: Every day | ORAL | 4 refills | Status: DC

## 2015-12-13 NOTE — Progress Notes (Signed)
Subjective:    Patient ID: Paula Ferguson, female    DOB: January 31, 1941, 75 y.o.   MRN: [id number]  HPI 75 year old patient who is seen today in follow-up. She has a history of osteoarthritis.  She complains of bilateral knee pain.  The right greater than the left.  She has had a right total knee replacement surgery and also a partial knee surgery on the left in 2007 She is followed by orthopedics.  She has hypertension and hypothyroidism. No lab and asked me 2 years. Remains on Coumadin anticoagulation, following aVR.  Cardiac status has been stable  Past Medical History:  Diagnosis Date  . ANEMIA 09/15/2009  . Blood transfusion 2000's; 11/17/2011   S/P "one of my other shoulder surgeries"; S/P left shoulder replacement  . CORONARY ARTERY DISEASE 08/26/2007   LOV Dr Tamala Julian 1/13 with EKG and clearance on chart  . Depression   . Heart murmur    hx- surgery  . HYPERLIPIDEMIA 02/07/2007  . HYPERTENSION 09/14/2006  . HYPOTHYROIDISM 09/14/2006  . LIVER FUNCTION TESTS, ABNORMAL, HX OF 01/12/2009  . OLECRANON BURSITIS, LEFT 08/26/2007  . OSTEOARTHRITIS 02/07/2007   "thumbs; knees; shoulders; ?back"  . Pneumonia    hx  . PTSD (post-traumatic stress disorder) 1976     Social History   Social History  . Marital status: Widowed    Spouse name: N/A  . Number of children: N/A  . Years of education: N/A   Occupational History  . Not on file.   Social History Main Topics  . Smoking status: Never Smoker  . Smokeless tobacco: Never Used  . Alcohol use No  . Drug use: No  . Sexual activity: Not Currently   Other Topics Concern  . Not on file   Social History Narrative  . No narrative on file    Past Surgical History:  Procedure Laterality Date  . ABDOMINAL HYSTERECTOMY  1981  . APPENDECTOMY  1981  . CARDIAC CATHETERIZATION  2003  . CARDIAC VALVE REPLACEMENT  2003   "aortic"  . CARPAL TUNNEL RELEASE  1990's   bilaterally  . CHOLECYSTECTOMY  1990's  . COLONOSCOPY    .  CORONARY ARTERY BYPASS GRAFT  2003   CABG X1 "mammary" w/aortic valve replacement   . DILATION AND CURETTAGE OF UTERUS  1981?  . EXPLORATORY LAPAROTOMY  ~ 1981  . FRACTURE SURGERY    . JOINT REPLACEMENT     right shoulder arthroscopy, left partial knee replacement  . KNEE ARTHROSCOPY  1980's; 2012   left; right  . PARTIAL KNEE ARTHROPLASTY  1990's   left  . REVERSE SHOULDER ARTHROPLASTY  11/17/2011   Procedure: REVERSE SHOULDER ARTHROPLASTY;  Surgeon: Augustin Schooling, MD;  Location: [place];  Service: Orthopedics;  Laterality: Left;  left reverse shoulder arthroplasty  . SHOULDER ARTHROSCOPY  2000's   right; "3 surgeries";  Marland Kitchen SHOULDER ARTHROSCOPY W/ ROTATOR CUFF REPAIR  1990's  . STERIOD INJECTION  11/17/2011   Procedure: STEROID INJECTION;  Surgeon: Augustin Schooling, MD;  Location: [place];  Service: Orthopedics;  Laterality: Right;  cortizone injection right thumb  . TONSILLECTOMY AND ADENOIDECTOMY  ~ 1964  . TOTAL KNEE ARTHROPLASTY  03/21/2011   Procedure: TOTAL KNEE ARTHROPLASTY;  Surgeon: Mauri Pole, MD;  Location: [facility];  Service: Orthopedics;  Laterality: Right;  . TOTAL SHOULDER REPLACEMENT  2012; 11/17/2011   right; left  . TRANSFER / TRANSPLANT ANKLE TENDON SUPERFICIAL / DEEP  1990's   "bunch of surgeries after dr  ruptured posterior tibial tendon", right foot  . v43.3     heart valve    Family History  Problem Relation Age of Onset  . Lung disease Mother   . Lung disease Father   . Healthy Sister   . Kidney disease Brother     Allergies  Allergen Reactions  . Hydromorphone Hcl Itching    "Dilaudid"  . Meperidine Hcl Itching  . Morphine Sulfate Itching    Current Outpatient Prescriptions on File Prior to Visit  Medication Sig Dispense Refill  . amoxicillin (AMOXIL) 500 MG capsule TAKE 4 TABLETS BY MOUTH ONE HOUR PRIOR TO DENTAL APPOINTMENT. 4 capsule 1  . BIOTIN PO Take 1 tablet by mouth daily.    Marland Kitchen buPROPion (WELLBUTRIN SR) 150 MG 12 hr tablet TAKE 1 TABLET BY  MOUTH DAILY. 90 tablet 1  . Calcium Citrate-Vitamin D (CITRACAL PETITES/VITAMIN D PO) Take 1 tablet by mouth daily.     . cholecalciferol (VITAMIN D) 1000 UNITS tablet Take 1,000 Units by mouth daily.    . Cyanocobalamin (VITAMIN B-12 PO) Take 1 tablet by mouth daily.    . ferrous sulfate 325 (65 FE) MG tablet Take 325 mg by mouth daily with breakfast.     . HYDROcodone-acetaminophen (NORCO) 7.5-325 MG tablet Take 1 tablet by mouth every 6 (six) hours as needed for moderate pain. 120 tablet 0  . levothyroxine (SYNTHROID, LEVOTHROID) 100 MCG tablet TAKE 1 TABLET BY MOUTH DAILY. 90 tablet 3  . losartan (COZAAR) 100 MG tablet TAKE 1 TABLET BY MOUTH DAILY. 90 tablet 4  . methocarbamol (ROBAXIN) 500 MG tablet TAKE 1 TABLET BY MOUTH 3 TIMES A DAY AS NEEDED. 60 tablet 2  . Multiple Vitamins-Minerals (CENTRUM PO) Take 1 tablet by mouth daily.    . Omega-3 Fatty Acids (FISH OIL PO) Take 1 tablet by mouth daily.    . traZODone (DESYREL) 100 MG tablet TAKE 1 TABLET (100 MG TOTAL) BY MOUTH AT BEDTIME AS NEEDED FOR SLEEP. 90 tablet 1  . verapamil (CALAN-SR) 240 MG CR tablet Take 0.5 tablets (120 mg total) by mouth at bedtime. 90 tablet 1  . warfarin (COUMADIN) 5 MG tablet TAKE 1 TABLET (5 MG TOTAL) BY MOUTH AS DIRECTED. 90 tablet 1   No current facility-administered medications on file prior to visit.     BP 140/70 (BP Location: Left Arm, Patient Position: Sitting, Cuff Size: Normal)   Pulse 81   Temp 98.2 F (36.8 C) (Oral)   Resp 20   Ht 5\' 2"  (1.575 m)   Wt 123 lb 6.1 oz (56 kg)   SpO2 97%   BMI 22.57 kg/m      Review of Systems  Constitutional: Negative.   HENT: Negative for congestion, dental problem, hearing loss, rhinorrhea, sinus pressure, sore throat and tinnitus.   Eyes: Negative for pain, discharge and visual disturbance.  Respiratory: Negative for cough and shortness of breath.   Cardiovascular: Negative for chest pain, palpitations and leg swelling.  Gastrointestinal: Negative  for abdominal distention, abdominal pain, blood in stool, constipation, diarrhea, nausea and vomiting.  Genitourinary: Negative for difficulty urinating, dysuria, flank pain, frequency, hematuria, pelvic pain, urgency, vaginal bleeding, vaginal discharge and vaginal pain.  Musculoskeletal: Positive for arthralgias, gait problem and myalgias. Negative for joint swelling.  Skin: Negative for rash.  Neurological: Negative for dizziness, syncope, speech difficulty, weakness, numbness and headaches.  Hematological: Negative for adenopathy.  Psychiatric/Behavioral: Negative for agitation, behavioral problems and dysphoric mood. The patient is not nervous/anxious.  Objective:   Physical Exam  Constitutional: She is oriented to person, place, and time. She appears well-developed and well-nourished.  HENT:  Head: Normocephalic.  Right Ear: External ear normal.  Left Ear: External ear normal.  Mouth/Throat: Oropharynx is clear and moist.  Eyes: Conjunctivae and EOM are normal. Pupils are equal, round, and reactive to light.  Neck: Normal range of motion. Neck supple. No thyromegaly present.  Cardiovascular: Normal rate, regular rhythm, normal heart sounds and intact distal pulses.   Sternotomy scar Prosthetic heart sounds  Pulmonary/Chest: Effort normal and breath sounds normal.  Abdominal: Soft. Bowel sounds are normal. She exhibits no mass. There is no tenderness.  Musculoskeletal: Normal range of motion.  Status post bilateral knee surgery Well-healed surgical scars No acute inflammatory changes  Lymphadenopathy:    She has no cervical adenopathy.  Neurological: She is alert and oriented to person, place, and time.  Skin: Skin is warm and dry. No rash noted.  Psychiatric: She has a normal mood and affect. Her behavior is normal.          Assessment & Plan:   Essential hypertension.  Blood pressure well controlled.  Medications updated no change in medical  therapy Osteoarthritis.  Status post bilateral knee surgery with chronic pain.  Follow-up orthopedics.  Analgesics refilled Status post aVR.  Continue monthly Coumadin checks Hypothyroidism.  We'll check TSH and screening lab  Follow-up 6 months  Donnel Venuto Pilar Plate

## 2015-12-13 NOTE — Progress Notes (Signed)
Pre visit review using our clinic review tool, if applicable. No additional management support is needed unless otherwise documented below in the visit note. 

## 2015-12-13 NOTE — Patient Instructions (Signed)
Limit your sodium (Salt) intake  Please check your blood pressure on a regular basis.  If it is consistently greater than 150/90, please make an office appointment.  Continue to track INR  every 4-6 weeks  Return in 6 months for follow-up

## 2015-12-13 NOTE — Addendum Note (Signed)
Addended by: Gari Crown D on: 12/13/2015 03:40 PM   Modules accepted: Orders

## 2015-12-14 LAB — CBC WITH DIFFERENTIAL/PLATELET
Basophils Absolute: 0 10*3/uL (ref 0.0–0.1)
Basophils Relative: 0.6 % (ref 0.0–3.0)
EOS PCT: 2.3 % (ref 0.0–5.0)
Eosinophils Absolute: 0.1 10*3/uL (ref 0.0–0.7)
HEMATOCRIT: 35.8 % — AB (ref 36.0–46.0)
HEMOGLOBIN: 12.1 g/dL (ref 12.0–15.0)
Lymphocytes Relative: 23 % (ref 12.0–46.0)
Lymphs Abs: 0.8 10*3/uL (ref 0.7–4.0)
MCHC: 33.8 g/dL (ref 30.0–36.0)
MCV: 91.4 fl (ref 78.0–100.0)
MONOS PCT: 8 % (ref 3.0–12.0)
Monocytes Absolute: 0.3 10*3/uL (ref 0.1–1.0)
Neutro Abs: 2.4 10*3/uL (ref 1.4–7.7)
Neutrophils Relative %: 66.1 % (ref 43.0–77.0)
Platelets: 157 10*3/uL (ref 150.0–400.0)
RBC: 3.92 Mil/uL (ref 3.87–5.11)
RDW: 13.6 % (ref 11.5–15.5)
WBC: 3.7 10*3/uL — AB (ref 4.0–10.5)

## 2015-12-14 LAB — LIPID PANEL
Cholesterol: 150 mg/dL (ref 0–200)
HDL: 38.3 mg/dL — AB (ref >39.00)
LDL CALC: 91 mg/dL (ref 0–99)
NONHDL: 112.12
Total CHOL/HDL Ratio: 4
Triglycerides: 107 mg/dL (ref 0.0–149.0)
VLDL: 21.4 mg/dL (ref 0.0–40.0)

## 2015-12-14 LAB — COMPREHENSIVE METABOLIC PANEL
ALBUMIN: 3.7 g/dL (ref 3.5–5.2)
ALK PHOS: 80 U/L (ref 39–117)
ALT: 31 U/L (ref 0–35)
AST: 63 U/L — ABNORMAL HIGH (ref 0–37)
BUN: 18 mg/dL (ref 6–23)
CO2: 32 mEq/L (ref 19–32)
Calcium: 10.4 mg/dL (ref 8.4–10.5)
Chloride: 103 mEq/L (ref 96–112)
Creatinine, Ser: 0.91 mg/dL (ref 0.40–1.20)
GFR: 64.19 mL/min (ref >60.00)
GLUCOSE: 93 mg/dL (ref 70–99)
POTASSIUM: 4.2 meq/L (ref 3.5–5.1)
Sodium: 141 mEq/L (ref 135–145)
TOTAL PROTEIN: 6.8 g/dL (ref 6.0–8.3)
Total Bilirubin: 0.8 mg/dL (ref 0.2–1.2)

## 2015-12-14 LAB — TSH: TSH: 0.62 u[IU]/mL (ref 0.35–4.50)

## 2015-12-24 ENCOUNTER — Telehealth: Payer: Self-pay | Admitting: Internal Medicine

## 2015-12-24 NOTE — Telephone Encounter (Signed)
Please call/notify patient that lab/test/procedure is normal  Cholesterol 150

## 2015-12-24 NOTE — Telephone Encounter (Signed)
Pt would like results of labs. Please call back. °

## 2015-12-24 NOTE — Telephone Encounter (Signed)
Pt calling for lab results from 10/23. Please advise.

## 2015-12-27 ENCOUNTER — Ambulatory Visit (INDEPENDENT_AMBULATORY_CARE_PROVIDER_SITE_OTHER): Payer: Medicare Other | Admitting: *Deleted

## 2015-12-27 DIAGNOSIS — I359 Nonrheumatic aortic valve disorder, unspecified: Secondary | ICD-10-CM

## 2015-12-27 DIAGNOSIS — Z952 Presence of prosthetic heart valve: Secondary | ICD-10-CM | POA: Diagnosis not present

## 2015-12-27 LAB — POCT INR: INR: 1.4

## 2015-12-28 NOTE — Telephone Encounter (Signed)
Spoke to pt, told her Lab results were normal per Dr.K and Cholesterol was 150. Pt verbalized understanding and said she is still having knee problems. Told her according to Dr.K's last note he wanted you to follow up with Ortho. Pt verbalized understanding.

## 2015-12-30 ENCOUNTER — Other Ambulatory Visit: Payer: Self-pay | Admitting: Internal Medicine

## 2016-01-03 ENCOUNTER — Ambulatory Visit (INDEPENDENT_AMBULATORY_CARE_PROVIDER_SITE_OTHER): Payer: Medicare Other | Admitting: *Deleted

## 2016-01-03 DIAGNOSIS — I2510 Atherosclerotic heart disease of native coronary artery without angina pectoris: Secondary | ICD-10-CM

## 2016-01-03 DIAGNOSIS — Z952 Presence of prosthetic heart valve: Secondary | ICD-10-CM

## 2016-01-03 DIAGNOSIS — I359 Nonrheumatic aortic valve disorder, unspecified: Secondary | ICD-10-CM

## 2016-01-03 LAB — POCT INR: INR: 2.7

## 2016-01-10 ENCOUNTER — Other Ambulatory Visit: Payer: Self-pay | Admitting: Interventional Cardiology

## 2016-01-20 ENCOUNTER — Ambulatory Visit (INDEPENDENT_AMBULATORY_CARE_PROVIDER_SITE_OTHER): Payer: Medicare Other | Admitting: *Deleted

## 2016-01-20 DIAGNOSIS — Z952 Presence of prosthetic heart valve: Secondary | ICD-10-CM

## 2016-01-20 DIAGNOSIS — I2510 Atherosclerotic heart disease of native coronary artery without angina pectoris: Secondary | ICD-10-CM

## 2016-01-20 DIAGNOSIS — I359 Nonrheumatic aortic valve disorder, unspecified: Secondary | ICD-10-CM

## 2016-01-20 LAB — POCT INR: INR: 3.1

## 2016-01-25 ENCOUNTER — Other Ambulatory Visit: Payer: Self-pay | Admitting: Internal Medicine

## 2016-01-25 MED ORDER — HYDROCODONE-ACETAMINOPHEN 7.5-325 MG PO TABS: 1.0000 | ORAL_TABLET | Freq: Four times a day (QID) | ORAL | 0 refills | Status: DC | PRN

## 2016-01-25 NOTE — Telephone Encounter (Signed)
Spoke to pt, informed that RX is ready for pick up. Rx printed and signed by Dr.K

## 2016-01-25 NOTE — Telephone Encounter (Signed)
° ° °  Pt request refill of the following: ° °HYDROcodone-acetaminophen (NORCO) 7.5-325 MG tablet ° ° °Phamacy: °

## 2016-02-02 ENCOUNTER — Ambulatory Visit (INDEPENDENT_AMBULATORY_CARE_PROVIDER_SITE_OTHER): Payer: Medicare Other | Admitting: *Deleted

## 2016-02-02 ENCOUNTER — Ambulatory Visit: Payer: Medicare Other | Admitting: Interventional Cardiology

## 2016-02-02 DIAGNOSIS — I2510 Atherosclerotic heart disease of native coronary artery without angina pectoris: Secondary | ICD-10-CM

## 2016-02-02 DIAGNOSIS — I359 Nonrheumatic aortic valve disorder, unspecified: Secondary | ICD-10-CM

## 2016-02-02 DIAGNOSIS — Z952 Presence of prosthetic heart valve: Secondary | ICD-10-CM | POA: Diagnosis not present

## 2016-02-02 LAB — POCT INR: INR: 2.9

## 2016-02-22 ENCOUNTER — Encounter: Payer: Medicare Other | Admitting: Internal Medicine

## 2016-03-01 ENCOUNTER — Ambulatory Visit (INDEPENDENT_AMBULATORY_CARE_PROVIDER_SITE_OTHER): Payer: Medicare Other | Admitting: *Deleted

## 2016-03-01 DIAGNOSIS — Z952 Presence of prosthetic heart valve: Secondary | ICD-10-CM

## 2016-03-01 DIAGNOSIS — I359 Nonrheumatic aortic valve disorder, unspecified: Secondary | ICD-10-CM | POA: Diagnosis not present

## 2016-03-01 LAB — POCT INR: INR: 2.4

## 2016-03-13 ENCOUNTER — Telehealth: Payer: Self-pay | Admitting: Internal Medicine

## 2016-03-13 ENCOUNTER — Other Ambulatory Visit: Payer: Self-pay | Admitting: Internal Medicine

## 2016-03-13 MED ORDER — HYDROCODONE-ACETAMINOPHEN 7.5-325 MG PO TABS: 1.0000 | ORAL_TABLET | Freq: Four times a day (QID) | ORAL | 0 refills | Status: DC | PRN

## 2016-03-13 NOTE — Telephone Encounter (Signed)
Pt request refill  °HYDROcodone-acetaminophen (NORCO) 7.5-325 MG tablet °

## 2016-03-13 NOTE — Telephone Encounter (Signed)
Pt notified Rx ready for pickup. Rx printed and signed.  

## 2016-03-30 ENCOUNTER — Ambulatory Visit (INDEPENDENT_AMBULATORY_CARE_PROVIDER_SITE_OTHER): Payer: Medicare Other | Admitting: *Deleted

## 2016-03-30 DIAGNOSIS — Z952 Presence of prosthetic heart valve: Secondary | ICD-10-CM | POA: Diagnosis not present

## 2016-03-30 DIAGNOSIS — I359 Nonrheumatic aortic valve disorder, unspecified: Secondary | ICD-10-CM

## 2016-03-30 LAB — POCT INR: INR: 3.4

## 2016-04-03 DIAGNOSIS — Z96651 Presence of right artificial knee joint: Secondary | ICD-10-CM | POA: Diagnosis not present

## 2016-04-03 DIAGNOSIS — Z96652 Presence of left artificial knee joint: Secondary | ICD-10-CM | POA: Diagnosis not present

## 2016-04-03 DIAGNOSIS — G8929 Other chronic pain: Secondary | ICD-10-CM | POA: Diagnosis not present

## 2016-04-03 DIAGNOSIS — M25562 Pain in left knee: Secondary | ICD-10-CM | POA: Diagnosis not present

## 2016-04-05 ENCOUNTER — Other Ambulatory Visit (HOSPITAL_COMMUNITY): Payer: Self-pay | Admitting: Orthopedic Surgery

## 2016-04-05 DIAGNOSIS — Z96653 Presence of artificial knee joint, bilateral: Secondary | ICD-10-CM

## 2016-04-13 ENCOUNTER — Ambulatory Visit (INDEPENDENT_AMBULATORY_CARE_PROVIDER_SITE_OTHER): Payer: Medicare Other | Admitting: *Deleted

## 2016-04-13 DIAGNOSIS — Z952 Presence of prosthetic heart valve: Secondary | ICD-10-CM | POA: Diagnosis not present

## 2016-04-13 DIAGNOSIS — I359 Nonrheumatic aortic valve disorder, unspecified: Secondary | ICD-10-CM

## 2016-04-13 LAB — POCT INR: INR: 2.2

## 2016-04-17 ENCOUNTER — Encounter (HOSPITAL_COMMUNITY): Payer: Self-pay

## 2016-04-17 ENCOUNTER — Encounter (HOSPITAL_COMMUNITY): Payer: Medicare Other

## 2016-04-21 ENCOUNTER — Encounter (HOSPITAL_COMMUNITY)
Admission: RE | Admit: 2016-04-21 | Discharge: 2016-04-21 | Disposition: A | Discharge status: Home / Self Care | Payer: Medicare Other | Source: Ambulatory Visit | Attending: Orthopedic Surgery | Admitting: Orthopedic Surgery

## 2016-04-21 DIAGNOSIS — M1711 Unilateral primary osteoarthritis, right knee: Secondary | ICD-10-CM | POA: Diagnosis not present

## 2016-04-21 DIAGNOSIS — Z96653 Presence of artificial knee joint, bilateral: Secondary | ICD-10-CM | POA: Diagnosis not present

## 2016-04-21 MED ORDER — TECHNETIUM TC 99M MEDRONATE IV KIT
25.0000 | PACK | Freq: Once | INTRAVENOUS | Status: AC | PRN
  Administered 2016-04-21: 25 via INTRAVENOUS

## 2016-04-27 ENCOUNTER — Telehealth: Payer: Self-pay | Admitting: Internal Medicine

## 2016-04-27 MED ORDER — HYDROCODONE-ACETAMINOPHEN 7.5-325 MG PO TABS: 1.0000 | ORAL_TABLET | Freq: Four times a day (QID) | ORAL | 0 refills | Status: DC | PRN

## 2016-04-27 MED ORDER — AMOXICILLIN 500 MG PO CAPS: ORAL_CAPSULE | ORAL | 1 refills | Status: DC

## 2016-04-27 NOTE — Telephone Encounter (Signed)
Pt need new Rx for Hydrocodone and also amoxicillin for her dental work.  Pharm:  [facility] [phone]

## 2016-04-27 NOTE — Telephone Encounter (Signed)
Rx awaiting to be signed

## 2016-04-28 ENCOUNTER — Encounter: Payer: Self-pay | Admitting: Interventional Cardiology

## 2016-04-28 NOTE — Telephone Encounter (Signed)
Pt notified Rx ready for pickup. Rx printed and signed.  

## 2016-05-01 ENCOUNTER — Ambulatory Visit: Payer: Medicare Other | Admitting: Interventional Cardiology

## 2016-05-09 DIAGNOSIS — Z96611 Presence of right artificial shoulder joint: Secondary | ICD-10-CM | POA: Diagnosis not present

## 2016-05-09 DIAGNOSIS — Z96612 Presence of left artificial shoulder joint: Secondary | ICD-10-CM | POA: Diagnosis not present

## 2016-05-09 DIAGNOSIS — Z471 Aftercare following joint replacement surgery: Secondary | ICD-10-CM | POA: Diagnosis not present

## 2016-05-12 DIAGNOSIS — M25562 Pain in left knee: Secondary | ICD-10-CM | POA: Diagnosis not present

## 2016-05-12 DIAGNOSIS — M5136 Other intervertebral disc degeneration, lumbar region: Secondary | ICD-10-CM | POA: Diagnosis not present

## 2016-05-12 DIAGNOSIS — Z96651 Presence of right artificial knee joint: Secondary | ICD-10-CM | POA: Diagnosis not present

## 2016-05-12 DIAGNOSIS — M25561 Pain in right knee: Secondary | ICD-10-CM | POA: Diagnosis not present

## 2016-05-16 ENCOUNTER — Ambulatory Visit (INDEPENDENT_AMBULATORY_CARE_PROVIDER_SITE_OTHER): Payer: Medicare Other | Admitting: *Deleted

## 2016-05-16 DIAGNOSIS — I359 Nonrheumatic aortic valve disorder, unspecified: Secondary | ICD-10-CM

## 2016-05-16 DIAGNOSIS — I2510 Atherosclerotic heart disease of native coronary artery without angina pectoris: Secondary | ICD-10-CM

## 2016-05-16 DIAGNOSIS — Z952 Presence of prosthetic heart valve: Secondary | ICD-10-CM | POA: Diagnosis not present

## 2016-05-16 LAB — POCT INR: INR: 3.7

## 2016-05-31 ENCOUNTER — Ambulatory Visit (INDEPENDENT_AMBULATORY_CARE_PROVIDER_SITE_OTHER): Payer: Medicare Other | Admitting: *Deleted

## 2016-05-31 DIAGNOSIS — I359 Nonrheumatic aortic valve disorder, unspecified: Secondary | ICD-10-CM

## 2016-05-31 DIAGNOSIS — Z952 Presence of prosthetic heart valve: Secondary | ICD-10-CM | POA: Diagnosis not present

## 2016-05-31 LAB — POCT INR: INR: 2.4

## 2016-06-20 ENCOUNTER — Other Ambulatory Visit: Payer: Self-pay | Admitting: Internal Medicine

## 2016-06-21 ENCOUNTER — Telehealth: Payer: Self-pay | Admitting: Internal Medicine

## 2016-06-21 NOTE — Telephone Encounter (Signed)
Filled on 04/27/16 for #120 Last seen 12/13/15 Coming in for acute problem on 07/05/16. Please advise.  Thanks!!

## 2016-06-21 NOTE — Telephone Encounter (Signed)
Ok for refill? 

## 2016-06-21 NOTE — Telephone Encounter (Signed)
Pt need new Rx for hydrocodone   Pt is aware of 3 business days for refills and someone will call when ready for pick up. °

## 2016-06-22 NOTE — Telephone Encounter (Signed)
Pt calling to check the status of the Rx and she is aware that Dr. Raliegh Ip is out of the office and will be back on 06/23/16

## 2016-06-23 MED ORDER — HYDROCODONE-ACETAMINOPHEN 7.5-325 MG PO TABS: 1.0000 | ORAL_TABLET | Freq: Four times a day (QID) | ORAL | 0 refills | Status: DC | PRN

## 2016-06-27 NOTE — Telephone Encounter (Signed)
Rx signed and picked by pt's daughter on 06/23/16

## 2016-07-04 ENCOUNTER — Ambulatory Visit (INDEPENDENT_AMBULATORY_CARE_PROVIDER_SITE_OTHER): Payer: Medicare Other | Admitting: *Deleted

## 2016-07-04 DIAGNOSIS — I359 Nonrheumatic aortic valve disorder, unspecified: Secondary | ICD-10-CM

## 2016-07-04 DIAGNOSIS — Z952 Presence of prosthetic heart valve: Secondary | ICD-10-CM

## 2016-07-04 LAB — POCT INR: INR: 3.1

## 2016-07-05 ENCOUNTER — Ambulatory Visit: Payer: Medicare Other | Admitting: Internal Medicine

## 2016-07-13 ENCOUNTER — Other Ambulatory Visit: Payer: Self-pay | Admitting: Internal Medicine

## 2016-07-21 ENCOUNTER — Encounter: Payer: Self-pay | Admitting: Internal Medicine

## 2016-07-21 ENCOUNTER — Ambulatory Visit (INDEPENDENT_AMBULATORY_CARE_PROVIDER_SITE_OTHER): Payer: Medicare Other | Admitting: Internal Medicine

## 2016-07-21 VITALS — BP 136/70 | HR 80 | Temp 97.9°F | Wt 118.6 lb

## 2016-07-21 DIAGNOSIS — Z952 Presence of prosthetic heart valve: Secondary | ICD-10-CM | POA: Diagnosis not present

## 2016-07-21 DIAGNOSIS — I10 Essential (primary) hypertension: Secondary | ICD-10-CM

## 2016-07-21 DIAGNOSIS — Z7901 Long term (current) use of anticoagulants: Secondary | ICD-10-CM | POA: Diagnosis not present

## 2016-07-21 DIAGNOSIS — Z5181 Encounter for therapeutic drug level monitoring: Secondary | ICD-10-CM

## 2016-07-21 DIAGNOSIS — E039 Hypothyroidism, unspecified: Secondary | ICD-10-CM | POA: Diagnosis not present

## 2016-07-21 DIAGNOSIS — I2510 Atherosclerotic heart disease of native coronary artery without angina pectoris: Secondary | ICD-10-CM | POA: Diagnosis not present

## 2016-07-21 NOTE — Progress Notes (Signed)
Subjective:    Patient ID: Paula Ferguson, female    DOB: 29-Sep-1940, 76 y.o.   MRN: [id number]  HPI  76 year old patient who is seen today for her biannual follow-up. She is scheduled for orthopedic surgery And also requires a medical clearance.  Clinically she is doing well except for some dental and her orthopedic concerns.  Denies any cardiopulmonary complaints, although her activities are more limited She has a prior history of mechanical aVR and remains on chronic Coumadin anticoagulation. She has hypothyroidism  Past Medical History:  Diagnosis Date  . ANEMIA 09/15/2009  . Blood transfusion 2000's; 11/17/2011   S/P "one of my other shoulder surgeries"; S/P left shoulder replacement  . CORONARY ARTERY DISEASE 08/26/2007   LOV Dr Tamala Julian 1/13 with EKG and clearance on chart  . Depression   . Heart murmur    hx- surgery  . HYPERLIPIDEMIA 02/07/2007  . HYPERTENSION 09/14/2006  . HYPOTHYROIDISM 09/14/2006  . LIVER FUNCTION TESTS, ABNORMAL, HX OF 01/12/2009  . OLECRANON BURSITIS, LEFT 08/26/2007  . OSTEOARTHRITIS 02/07/2007   "thumbs; knees; shoulders; ?back"  . Pneumonia    hx  . PTSD (post-traumatic stress disorder) 1976     Social History   Social History  . Marital status: Widowed    Spouse name: N/A  . Number of children: N/A  . Years of education: N/A   Occupational History  . Not on file.   Social History Main Topics  . Smoking status: Never Smoker  . Smokeless tobacco: Never Used  . Alcohol use No  . Drug use: No  . Sexual activity: Not Currently   Other Topics Concern  . Not on file   Social History Narrative  . No narrative on file    Past Surgical History:  Procedure Laterality Date  . ABDOMINAL HYSTERECTOMY  1981  . APPENDECTOMY  1981  . CARDIAC CATHETERIZATION  2003  . CARDIAC VALVE REPLACEMENT  2003   "aortic"  . CARPAL TUNNEL RELEASE  1990's   bilaterally  . CHOLECYSTECTOMY  1990's  . COLONOSCOPY    . CORONARY ARTERY BYPASS GRAFT  2003     CABG X1 "mammary" w/aortic valve replacement   . DILATION AND CURETTAGE OF UTERUS  1981?  . EXPLORATORY LAPAROTOMY  ~ 1981  . FRACTURE SURGERY    . JOINT REPLACEMENT     right shoulder arthroscopy, left partial knee replacement  . KNEE ARTHROSCOPY  1980's; 2012   left; right  . PARTIAL KNEE ARTHROPLASTY  1990's   left  . REVERSE SHOULDER ARTHROPLASTY  11/17/2011   Procedure: REVERSE SHOULDER ARTHROPLASTY;  Surgeon: Augustin Schooling, MD;  Location: [place];  Service: Orthopedics;  Laterality: Left;  left reverse shoulder arthroplasty  . SHOULDER ARTHROSCOPY  2000's   right; "3 surgeries";  Marland Kitchen SHOULDER ARTHROSCOPY W/ ROTATOR CUFF REPAIR  1990's  . STERIOD INJECTION  11/17/2011   Procedure: STEROID INJECTION;  Surgeon: Augustin Schooling, MD;  Location: [place];  Service: Orthopedics;  Laterality: Right;  cortizone injection right thumb  . TONSILLECTOMY AND ADENOIDECTOMY  ~ 1964  . TOTAL KNEE ARTHROPLASTY  03/21/2011   Procedure: TOTAL KNEE ARTHROPLASTY;  Surgeon: Mauri Pole, MD;  Location: [facility];  Service: Orthopedics;  Laterality: Right;  . TOTAL SHOULDER REPLACEMENT  2012; 11/17/2011   right; left  . TRANSFER / TRANSPLANT ANKLE TENDON SUPERFICIAL / DEEP  1990's   "bunch of surgeries after dr ruptured posterior tibial tendon", right foot  . v43.3  heart valve    Family History  Problem Relation Age of Onset  . Lung disease Mother   . Lung disease Father   . Healthy Sister   . Kidney disease Brother     Allergies  Allergen Reactions  . Hydromorphone Hcl Itching    "Dilaudid"  . Meperidine Hcl Itching  . Morphine Sulfate Itching    Current Outpatient Prescriptions on File Prior to Visit  Medication Sig Dispense Refill  . amoxicillin (AMOXIL) 500 MG capsule TAKE 4 TABLETS BY MOUTH ONE HOUR PRIOR TO DENTAL APPOINTMENT. 4 capsule 1  . BIOTIN PO Take 1 tablet by mouth daily.    Marland Kitchen buPROPion (WELLBUTRIN SR) 150 MG 12 hr tablet Take 1 tablet (150 mg total) by mouth daily. 90  tablet 4  . Calcium Citrate-Vitamin D (CITRACAL PETITES/VITAMIN D PO) Take 1 tablet by mouth daily.     . cholecalciferol (VITAMIN D) 1000 UNITS tablet Take 1,000 Units by mouth daily.    . Cyanocobalamin (VITAMIN B-12 PO) Take 1 tablet by mouth daily.    . ferrous sulfate 325 (65 FE) MG tablet Take 325 mg by mouth daily with breakfast.     . HYDROcodone-acetaminophen (NORCO) 7.5-325 MG tablet Take 1 tablet by mouth every 6 (six) hours as needed for moderate pain. 120 tablet 0  . levothyroxine (SYNTHROID, LEVOTHROID) 100 MCG tablet TAKE 1 TABLET BY MOUTH DAILY. 90 tablet 1  . losartan (COZAAR) 100 MG tablet TAKE 1 TABLET BY MOUTH DAILY. 90 tablet 4  . methocarbamol (ROBAXIN) 500 MG tablet TAKE 1 TABLET BY MOUTH 3 TIMES A DAY AS NEEDED. 60 tablet 2  . Multiple Vitamins-Minerals (CENTRUM PO) Take 1 tablet by mouth daily.    . Omega-3 Fatty Acids (FISH OIL PO) Take 1 tablet by mouth daily.    . traZODone (DESYREL) 100 MG tablet TAKE 1 TABLET BY MOUTH AT BEDTIME AS NEEDED FOR SLEEP. 90 tablet 2  . verapamil (CALAN-SR) 240 MG CR tablet TAKE 1/2 TABLET BY MOUTH AT BEDTIME. 90 tablet 1  . warfarin (COUMADIN) 5 MG tablet TAKE 1 TABLET (5 MG TOTAL) BY MOUTH AS DIRECTED. 90 tablet 1   No current facility-administered medications on file prior to visit.     BP 136/70 (BP Location: Left Arm, Patient Position: Sitting, Cuff Size: Normal)   Pulse 80   Temp 97.9 F (36.6 C) (Oral)   Wt 118 lb 9.6 oz (53.8 kg)   SpO2 97%   BMI 21.69 kg/m      Review of Systems  Constitutional: Negative.   HENT: Positive for dental problem. Negative for congestion, hearing loss, rhinorrhea, sinus pressure, sore throat and tinnitus.   Eyes: Negative for pain, discharge and visual disturbance.  Respiratory: Negative for cough and shortness of breath.   Cardiovascular: Negative for chest pain, palpitations and leg swelling.  Gastrointestinal: Negative for abdominal distention, abdominal pain, blood in stool,  constipation, diarrhea, nausea and vomiting.  Genitourinary: Negative for difficulty urinating, dysuria, flank pain, frequency, hematuria, pelvic pain, urgency, vaginal bleeding, vaginal discharge and vaginal pain.  Musculoskeletal: Positive for arthralgias, back pain, gait problem and joint swelling.  Skin: Negative for rash.  Neurological: Negative for dizziness, syncope, speech difficulty, weakness, numbness and headaches.  Hematological: Negative for adenopathy.  Psychiatric/Behavioral: Negative for agitation, behavioral problems and dysphoric mood. The patient is not nervous/anxious.        Objective:   Physical Exam  Constitutional: She is oriented to person, place, and time. She appears well-developed and well-nourished.  HENT:  Head: Normocephalic.  Right Ear: External ear normal.  Left Ear: External ear normal.  Mouth/Throat: Oropharynx is clear and moist.  Eyes: Conjunctivae and EOM are normal. Pupils are equal, round, and reactive to light.  Neck: Normal range of motion. Neck supple. No thyromegaly present.  Cardiovascular: Normal rate, regular rhythm, normal heart sounds and intact distal pulses.   Prosthetic heart sounds  Pulmonary/Chest: Effort normal and breath sounds normal.  Abdominal: Soft. Bowel sounds are normal. She exhibits no mass. There is no tenderness.  Musculoskeletal: Normal range of motion. She exhibits no edema.  Lymphadenopathy:    She has no cervical adenopathy.  Neurological: She is alert and oriented to person, place, and time.  Skin: Skin is warm and dry. No rash noted.  Psychiatric: She has a normal mood and affect. Her behavior is normal.          Assessment & Plan:   Essential hypertension, stable Hypothyroidism.  Continue levothyroxine Coronary artery disease, stable Status post mechanical aVR.  Continue chronic Coumadin anticoagulation Chronic anticoagulation.  Goal INR 2.5 to 3.5   CPX 6 months  Nyoka Cowden

## 2016-07-21 NOTE — Patient Instructions (Addendum)
WE NOW OFFER   [facility] FAST TRACK!!!  SAME DAY Appointments for ACUTE CARE  Such as: Sprains, Injuries, cuts, abrasions, rashes, muscle pain, joint pain, back pain Colds, flu, sore throats, headache, allergies, cough, fever  Ear pain, sinus and eye infections Abdominal pain, nausea, vomiting, diarrhea, upset stomach Animal/insect bites  3 Easy Ways to Schedule: Walk-In Scheduling Call in scheduling Mychart Sign-up: [email or web address]  Limit your sodium (Salt) intake  Please check your blood pressure on a regular basis.  If it is consistently greater than 150/90, please make an office appointment.  Continue Coumadin clinic every 4-6 weeks  Return in 6 months for follow-up

## 2016-07-26 ENCOUNTER — Ambulatory Visit (INDEPENDENT_AMBULATORY_CARE_PROVIDER_SITE_OTHER): Payer: Medicare Other | Admitting: *Deleted

## 2016-07-26 DIAGNOSIS — I359 Nonrheumatic aortic valve disorder, unspecified: Secondary | ICD-10-CM

## 2016-07-26 DIAGNOSIS — Z952 Presence of prosthetic heart valve: Secondary | ICD-10-CM

## 2016-07-26 DIAGNOSIS — I2510 Atherosclerotic heart disease of native coronary artery without angina pectoris: Secondary | ICD-10-CM

## 2016-07-26 LAB — POCT INR: INR: 3.5

## 2016-08-21 ENCOUNTER — Telehealth: Payer: Self-pay | Admitting: Internal Medicine

## 2016-08-21 ENCOUNTER — Ambulatory Visit (INDEPENDENT_AMBULATORY_CARE_PROVIDER_SITE_OTHER): Payer: Medicare Other | Admitting: *Deleted

## 2016-08-21 DIAGNOSIS — Z952 Presence of prosthetic heart valve: Secondary | ICD-10-CM | POA: Diagnosis not present

## 2016-08-21 DIAGNOSIS — I2510 Atherosclerotic heart disease of native coronary artery without angina pectoris: Secondary | ICD-10-CM

## 2016-08-21 LAB — POCT INR: INR: 3.1

## 2016-08-21 MED ORDER — HYDROCODONE-ACETAMINOPHEN 7.5-325 MG PO TABS: 1.0000 | ORAL_TABLET | Freq: Four times a day (QID) | ORAL | 0 refills | Status: DC | PRN

## 2016-08-21 NOTE — Telephone Encounter (Signed)
Pt notified Rx ready for pickup. Rx printed and signed.  

## 2016-08-21 NOTE — Telephone Encounter (Signed)
Pt has cancelled knee surgery due right knee has prosthesis that was recalled. Pt needs new rx hydrocodone

## 2016-08-28 ENCOUNTER — Telehealth: Payer: Self-pay | Admitting: Internal Medicine

## 2016-08-28 NOTE — Telephone Encounter (Signed)
Pt would like to do the cologuard test and would like to know what steps she needs to take.  Pt would like to have a call back.

## 2016-08-28 NOTE — Telephone Encounter (Signed)
OK to order test? Please advise

## 2016-08-28 NOTE — Telephone Encounter (Signed)
Okay for cologuard testing

## 2016-08-29 NOTE — Telephone Encounter (Signed)
Called pt to inform that she need to stop by the office to sign  cologuard form for testing. No answer, a detailed message was left.

## 2016-09-01 ENCOUNTER — Telehealth: Payer: Self-pay | Admitting: Internal Medicine

## 2016-09-01 MED ORDER — AMOXICILLIN 500 MG PO CAPS: ORAL_CAPSULE | ORAL | 0 refills | Status: DC

## 2016-09-01 NOTE — Telephone Encounter (Signed)
Pt is having a dental extraction and would like an antibiotic due to her cardiac history.   Please advise

## 2016-09-01 NOTE — Addendum Note (Signed)
Addended by: Abelardo Diesel on: 09/01/2016 02:39 PM   Modules accepted: Orders

## 2016-09-01 NOTE — Telephone Encounter (Signed)
Amoxicillin 500 mg  #8  Take 4 tablets 30-60 minutes prior to the dental procedure

## 2016-09-04 ENCOUNTER — Other Ambulatory Visit: Payer: Self-pay | Admitting: Internal Medicine

## 2016-09-04 ENCOUNTER — Ambulatory Visit (INDEPENDENT_AMBULATORY_CARE_PROVIDER_SITE_OTHER): Payer: Medicare Other | Admitting: *Deleted

## 2016-09-04 DIAGNOSIS — I2510 Atherosclerotic heart disease of native coronary artery without angina pectoris: Secondary | ICD-10-CM

## 2016-09-04 DIAGNOSIS — Z952 Presence of prosthetic heart valve: Secondary | ICD-10-CM

## 2016-09-04 LAB — POCT INR: INR: 2.9

## 2016-09-11 DIAGNOSIS — Z1212 Encounter for screening for malignant neoplasm of rectum: Secondary | ICD-10-CM | POA: Diagnosis not present

## 2016-09-11 DIAGNOSIS — Z1211 Encounter for screening for malignant neoplasm of colon: Secondary | ICD-10-CM | POA: Diagnosis not present

## 2016-09-11 LAB — COLOGUARD: Cologuard: NEGATIVE

## 2016-09-15 ENCOUNTER — Ambulatory Visit (INDEPENDENT_AMBULATORY_CARE_PROVIDER_SITE_OTHER): Payer: Medicare Other | Admitting: Pharmacist

## 2016-09-15 DIAGNOSIS — Z952 Presence of prosthetic heart valve: Secondary | ICD-10-CM

## 2016-09-15 DIAGNOSIS — I2510 Atherosclerotic heart disease of native coronary artery without angina pectoris: Secondary | ICD-10-CM

## 2016-09-15 LAB — POCT INR: INR: 2.1

## 2016-09-18 ENCOUNTER — Inpatient Hospital Stay: Admit: 2016-09-18 | Payer: Medicare Other | Admitting: Orthopedic Surgery

## 2016-09-18 ENCOUNTER — Encounter: Payer: Self-pay | Admitting: Internal Medicine

## 2016-09-18 SURGERY — CONVERSION, ARTHROPLASTY, KNEE, PARTIAL, TO TOTAL KNEE ARTHROPLASTY
Anesthesia: Spinal | Laterality: Left

## 2016-10-02 ENCOUNTER — Encounter: Payer: Self-pay | Admitting: Internal Medicine

## 2016-10-02 DIAGNOSIS — M8589 Other specified disorders of bone density and structure, multiple sites: Secondary | ICD-10-CM | POA: Diagnosis not present

## 2016-10-02 DIAGNOSIS — Z1231 Encounter for screening mammogram for malignant neoplasm of breast: Secondary | ICD-10-CM | POA: Diagnosis not present

## 2016-10-02 LAB — HM DEXA SCAN

## 2016-10-03 ENCOUNTER — Telehealth: Payer: Self-pay | Admitting: Internal Medicine

## 2016-10-03 MED ORDER — HYDROCODONE-ACETAMINOPHEN 7.5-325 MG PO TABS: 1.0000 | ORAL_TABLET | Freq: Four times a day (QID) | ORAL | 0 refills | Status: DC | PRN

## 2016-10-03 NOTE — Telephone Encounter (Signed)
Pt need new Rx for hydrocodone   Pt is aware of 3 business days for refills and someone will call when ready for pick up. °

## 2016-10-03 NOTE — Telephone Encounter (Signed)
Rx printed, awaiting to be signed  

## 2016-10-04 NOTE — Telephone Encounter (Signed)
Pt notified Rx ready for pickup. Rx printed and signed.  

## 2016-10-06 ENCOUNTER — Telehealth: Payer: Self-pay | Admitting: Internal Medicine

## 2016-10-06 NOTE — Telephone Encounter (Signed)
Pt brought ID and picked up script at front desk. cb

## 2016-10-09 ENCOUNTER — Ambulatory Visit (INDEPENDENT_AMBULATORY_CARE_PROVIDER_SITE_OTHER): Payer: Medicare Other | Admitting: *Deleted

## 2016-10-09 DIAGNOSIS — I2510 Atherosclerotic heart disease of native coronary artery without angina pectoris: Secondary | ICD-10-CM

## 2016-10-09 DIAGNOSIS — Z952 Presence of prosthetic heart valve: Secondary | ICD-10-CM | POA: Diagnosis not present

## 2016-10-09 LAB — POCT INR: INR: 2.4

## 2016-10-12 ENCOUNTER — Encounter: Payer: Self-pay | Admitting: Internal Medicine

## 2016-11-09 ENCOUNTER — Encounter: Payer: Self-pay | Admitting: Internal Medicine

## 2016-11-10 ENCOUNTER — Telehealth: Payer: Self-pay | Admitting: Interventional Cardiology

## 2016-11-10 NOTE — Telephone Encounter (Signed)
Returned call, [facility] for them to call back to see what they needed.

## 2016-11-10 NOTE — Telephone Encounter (Signed)
New message    Pt and pt daughter are asking for a call back from [place].

## 2016-11-15 DIAGNOSIS — Z96611 Presence of right artificial shoulder joint: Secondary | ICD-10-CM | POA: Diagnosis not present

## 2016-11-15 DIAGNOSIS — M25511 Pain in right shoulder: Secondary | ICD-10-CM | POA: Diagnosis not present

## 2016-11-15 DIAGNOSIS — G8929 Other chronic pain: Secondary | ICD-10-CM | POA: Diagnosis not present

## 2016-11-15 DIAGNOSIS — Z96612 Presence of left artificial shoulder joint: Secondary | ICD-10-CM | POA: Diagnosis not present

## 2016-11-20 ENCOUNTER — Ambulatory Visit (INDEPENDENT_AMBULATORY_CARE_PROVIDER_SITE_OTHER): Payer: Medicare Other | Admitting: *Deleted

## 2016-11-20 DIAGNOSIS — Z952 Presence of prosthetic heart valve: Secondary | ICD-10-CM | POA: Diagnosis not present

## 2016-11-20 DIAGNOSIS — Z5181 Encounter for therapeutic drug level monitoring: Secondary | ICD-10-CM

## 2016-11-20 LAB — POCT INR: INR: 2.7

## 2016-11-22 ENCOUNTER — Telehealth: Payer: Self-pay | Admitting: *Deleted

## 2016-11-22 NOTE — Telephone Encounter (Signed)
Patient called requesting a refill on her hydrocodone.  Patient also stated after christmas changing from a parsel to a total on her left knee with her orthopedic.

## 2016-11-23 NOTE — Telephone Encounter (Signed)
Spoke with pt and informed her that a pain management OV is now required to refill hydrocodone. Pt verbalized understanding. Stated she would call back later to make an appt.

## 2016-11-23 NOTE — Telephone Encounter (Signed)
The below letter was mailed to pt.  "Dear patients, The Strengthen Opioid Misuse Prevention (STOP) Act of 2017 has been signed into law to fight the opioid problem that has had a major impact in [place] and the [place].  [facility] wants to be sure to follow the law while we continue to provide you with the exceptional care you have come to expect.   For most of you, nothing will change.  Those of you who get a regular long-term pain management prescription from one of our providers will need to schedule a separate pain management appointment.  At this appointment, your provider will talk you through the changes we have had to begin because of the STOP Act.  It's the law.  What this means for you is that now you will need to have a separate pain management visit with your provider at least every 3 months.  You will also need to sign an updated controlled substance contract that will be discussed with you in detail during your visit.   We know this change can be confusing and uncomfortable, but we are here to help you every step of the way.  Please contact us today to set up your pain management appointment.    Sincerely,   [facility] " 

## 2016-11-28 ENCOUNTER — Encounter: Payer: Self-pay | Admitting: Internal Medicine

## 2016-11-28 ENCOUNTER — Ambulatory Visit (INDEPENDENT_AMBULATORY_CARE_PROVIDER_SITE_OTHER): Payer: Medicare Other | Admitting: Internal Medicine

## 2016-11-28 VITALS — BP 142/68 | HR 77 | Temp 98.4°F | Ht 62.0 in | Wt 120.0 lb

## 2016-11-28 DIAGNOSIS — Z79899 Other long term (current) drug therapy: Secondary | ICD-10-CM | POA: Diagnosis not present

## 2016-11-28 DIAGNOSIS — I2510 Atherosclerotic heart disease of native coronary artery without angina pectoris: Secondary | ICD-10-CM | POA: Diagnosis not present

## 2016-11-28 DIAGNOSIS — G8929 Other chronic pain: Secondary | ICD-10-CM | POA: Diagnosis not present

## 2016-11-28 MED ORDER — HYDROCODONE-ACETAMINOPHEN 7.5-325 MG PO TABS: 1.0000 | ORAL_TABLET | Freq: Four times a day (QID) | ORAL | 0 refills | Status: DC | PRN

## 2016-11-28 NOTE — Patient Instructions (Signed)
Call or return to clinic prn if these symptoms worsen or fail to improve as anticipated.

## 2016-11-28 NOTE — Progress Notes (Signed)
   Subjective:    Patient ID: Paula Ferguson, female    DOB: [birth year], 76 y.o.   MRN: [id number]  HPI  76 year old patient who has advanced generalized arthritis, multiple prior orthopedic procedures , who is seen today for follow-up of pain management.  [place] printed, initialed  and scanned into the EMR.  A new pain contract reviewed and signed  Indication for chronic opioid: patient has diffuse osteoarthritis.  Status post multiple orthopedic procedures.  She has chronic pain in multiple joints, especially weightbearing joints, such as knees Medication and dose: hydrocodone acetaminophen 7.5-325; 1 half to 1 tablet 4 times daily # pills per month: limited to a maximum 120 pills per month Last UDS date: 11/28/2016 Pain contract signed  Signed 11/28/2016 Date narcotic database last reviewed (include red flags): 11/28/2016  Review of Systems     Objective:   Physical Exam        Assessment & Plan:    Encounter for chronic pain management (G89.29) Narcotic use  (711.90) Pain management contract signed (Z02.89)  Nyoka Cowden

## 2016-11-29 ENCOUNTER — Telehealth: Payer: Self-pay | Admitting: Internal Medicine

## 2016-11-29 NOTE — Telephone Encounter (Signed)
° ° °  FYI  Pt call to say she tried to go to the bathroom yesterday for her urine test but she could not . She said she come in on Friday and try again

## 2016-11-30 NOTE — Telephone Encounter (Signed)
Yes please

## 2016-12-09 ENCOUNTER — Other Ambulatory Visit: Payer: Self-pay | Admitting: Internal Medicine

## 2016-12-12 DIAGNOSIS — Z23 Encounter for immunization: Secondary | ICD-10-CM | POA: Diagnosis not present

## 2016-12-14 DIAGNOSIS — D1801 Hemangioma of skin and subcutaneous tissue: Secondary | ICD-10-CM | POA: Diagnosis not present

## 2016-12-14 DIAGNOSIS — L812 Freckles: Secondary | ICD-10-CM | POA: Diagnosis not present

## 2016-12-14 DIAGNOSIS — L814 Other melanin hyperpigmentation: Secondary | ICD-10-CM | POA: Diagnosis not present

## 2016-12-14 DIAGNOSIS — L853 Xerosis cutis: Secondary | ICD-10-CM | POA: Diagnosis not present

## 2016-12-14 DIAGNOSIS — L821 Other seborrheic keratosis: Secondary | ICD-10-CM | POA: Diagnosis not present

## 2016-12-18 ENCOUNTER — Ambulatory Visit (INDEPENDENT_AMBULATORY_CARE_PROVIDER_SITE_OTHER): Payer: Medicare Other | Admitting: *Deleted

## 2016-12-18 DIAGNOSIS — Z952 Presence of prosthetic heart valve: Secondary | ICD-10-CM

## 2016-12-18 DIAGNOSIS — Z5181 Encounter for therapeutic drug level monitoring: Secondary | ICD-10-CM | POA: Diagnosis not present

## 2016-12-18 LAB — POCT INR: INR: 2.1

## 2016-12-20 ENCOUNTER — Other Ambulatory Visit: Payer: Self-pay | Admitting: Interventional Cardiology

## 2017-01-16 ENCOUNTER — Ambulatory Visit (INDEPENDENT_AMBULATORY_CARE_PROVIDER_SITE_OTHER): Payer: Medicare Other

## 2017-01-16 DIAGNOSIS — Z952 Presence of prosthetic heart valve: Secondary | ICD-10-CM | POA: Diagnosis not present

## 2017-01-16 DIAGNOSIS — Z5181 Encounter for therapeutic drug level monitoring: Secondary | ICD-10-CM | POA: Diagnosis not present

## 2017-01-16 LAB — POCT INR: INR: 2.5

## 2017-01-16 NOTE — Patient Instructions (Signed)
Continue taking 1/2 tablet every day except 1 tablet on Sundays, Tuesdays,  and Thursdays. Recheck INR in 4 weeks. Call with new or different meds [phone] or if scheduled for any procedures

## 2017-01-18 DIAGNOSIS — Z96612 Presence of left artificial shoulder joint: Secondary | ICD-10-CM | POA: Diagnosis not present

## 2017-01-18 DIAGNOSIS — Z96611 Presence of right artificial shoulder joint: Secondary | ICD-10-CM | POA: Diagnosis not present

## 2017-01-19 ENCOUNTER — Other Ambulatory Visit: Payer: Self-pay | Admitting: Internal Medicine

## 2017-02-08 DIAGNOSIS — H40033 Anatomical narrow angle, bilateral: Secondary | ICD-10-CM | POA: Diagnosis not present

## 2017-02-08 DIAGNOSIS — H04123 Dry eye syndrome of bilateral lacrimal glands: Secondary | ICD-10-CM | POA: Diagnosis not present

## 2017-02-14 ENCOUNTER — Other Ambulatory Visit: Payer: Self-pay | Admitting: Internal Medicine

## 2017-02-15 DIAGNOSIS — H04123 Dry eye syndrome of bilateral lacrimal glands: Secondary | ICD-10-CM | POA: Diagnosis not present

## 2017-02-27 ENCOUNTER — Ambulatory Visit (INDEPENDENT_AMBULATORY_CARE_PROVIDER_SITE_OTHER): Payer: Medicare Other | Admitting: *Deleted

## 2017-02-27 DIAGNOSIS — Z952 Presence of prosthetic heart valve: Secondary | ICD-10-CM | POA: Diagnosis not present

## 2017-02-27 DIAGNOSIS — Z5181 Encounter for therapeutic drug level monitoring: Secondary | ICD-10-CM

## 2017-02-27 LAB — POCT INR: INR: 2.4

## 2017-02-27 NOTE — Patient Instructions (Addendum)
Description   Continue taking 1/2 tablet every day except 1 tablet on Sundays, Tuesdays,  and Thursdays. Recheck INR in 6weeks. Call with new or different meds [phone] or if scheduled for any procedures

## 2017-03-01 ENCOUNTER — Encounter: Payer: Self-pay | Admitting: Internal Medicine

## 2017-03-01 ENCOUNTER — Ambulatory Visit (INDEPENDENT_AMBULATORY_CARE_PROVIDER_SITE_OTHER): Payer: Medicare Other | Admitting: Internal Medicine

## 2017-03-01 VITALS — BP 140/70 | HR 79 | Temp 98.2°F | Ht 62.0 in | Wt 121.4 lb

## 2017-03-01 DIAGNOSIS — F1190 Opioid use, unspecified, uncomplicated: Secondary | ICD-10-CM

## 2017-03-01 DIAGNOSIS — M159 Polyosteoarthritis, unspecified: Secondary | ICD-10-CM

## 2017-03-01 DIAGNOSIS — G8929 Other chronic pain: Secondary | ICD-10-CM

## 2017-03-01 DIAGNOSIS — G894 Chronic pain syndrome: Secondary | ICD-10-CM | POA: Diagnosis not present

## 2017-03-01 DIAGNOSIS — R300 Dysuria: Secondary | ICD-10-CM | POA: Diagnosis not present

## 2017-03-01 DIAGNOSIS — M150 Primary generalized (osteo)arthritis: Secondary | ICD-10-CM

## 2017-03-01 LAB — POCT URINALYSIS DIPSTICK
BILIRUBIN UA: NEGATIVE
GLUCOSE UA: NEGATIVE
KETONES UA: NEGATIVE
Leukocytes, UA: NEGATIVE
Nitrite, UA: NEGATIVE
Urobilinogen, UA: 1 E.U./dL
pH, UA: 5.5 (ref 5.0–8.0)

## 2017-03-01 MED ORDER — HYDROCODONE-ACETAMINOPHEN 7.5-325 MG PO TABS: 1.0000 | ORAL_TABLET | Freq: Four times a day (QID) | ORAL | 0 refills | Status: DC | PRN

## 2017-03-01 NOTE — Progress Notes (Signed)
   Subjective:    Patient ID: MYKAL KIRCHMAN, female    DOB: 09/23/1940, 77 y.o.   MRN: [id number]  HPI  77 year old patient who has advanced generalized arthritis, multiple prior orthopedic procedures , who is seen today for follow-up of pain management.  She is also complaining of some dysuria.  She does have a history of high caffeine use with ice tea  She is contemplating left total knee replacement therapy.  She has had a partial in the past  [employer], initialed  and scanned into the EMR.  A new pain contract reviewed and signed  Indication for chronic opioid: patient has diffuse osteoarthritis.  Status post multiple orthopedic procedures.  She has chronic pain in multiple joints, especially weightbearing joints, such as knees Medication and dose: hydrocodone acetaminophen 7.5-325; 1 half to 1 tablet 4 times daily # pills per month: limited to a maximum 120 pills per month Last UDS date: 11/28/2016.  This was ordered but no report.  We will repeat today Pain contract signed  Signed 11/28/2016 Date narcotic database last reviewed (include red flags): 11/28/2016   Review of Systems     Objective:   Physical Exam        Assessment & Plan:    Encounter for chronic pain management (G89.29) Narcotic use  (711.90) Pain management contract signed (Z02.89) October 2018  Dysuria.  Will check a urinalysis  Nyoka Cowden

## 2017-03-01 NOTE — Patient Instructions (Signed)
Limit your sodium (Salt) intake  Prothrombin time/INR is every 4-6 weeks  Return in 3-6 months for follow-up

## 2017-03-06 LAB — PAIN MGMT, PROFILE 8 W/CONF, U
6 Acetylmorphine: NEGATIVE ng/mL (ref <10)
ALCOHOL METABOLITES: NEGATIVE ng/mL (ref <500)
Amphetamine: NEGATIVE ng/mL (ref <250)
Amphetamines: NEGATIVE ng/mL (ref <500)
BUPRENORPHINE, URINE: NEGATIVE ng/mL (ref <5)
Benzodiazepines: NEGATIVE ng/mL (ref <100)
CODEINE: NEGATIVE ng/mL (ref <50)
Cocaine Metabolite: NEGATIVE ng/mL (ref <150)
Creatinine: 187.2 mg/dL
HYDROCODONE: 6569 ng/mL — AB (ref <50)
Hydromorphone: 53 ng/mL — ABNORMAL HIGH (ref <50)
MARIJUANA METABOLITE: NEGATIVE ng/mL (ref <20)
MDA: NEGATIVE ng/mL (ref <200)
MDMA: NEGATIVE ng/mL (ref <200)
MDMA: NEGATIVE ng/mL (ref <500)
Methamphetamine: NEGATIVE ng/mL (ref <250)
Morphine: NEGATIVE ng/mL (ref <50)
NORHYDROCODONE: 5432 ng/mL — AB (ref <50)
OPIATES: POSITIVE ng/mL — AB (ref <100)
Oxidant: NEGATIVE ug/mL (ref <200)
Oxycodone: NEGATIVE ng/mL (ref <100)
pH: 6.74 (ref 4.5–9.0)

## 2017-03-13 ENCOUNTER — Ambulatory Visit (INDEPENDENT_AMBULATORY_CARE_PROVIDER_SITE_OTHER): Payer: Medicare Other | Admitting: Internal Medicine

## 2017-03-13 ENCOUNTER — Encounter: Payer: Self-pay | Admitting: Internal Medicine

## 2017-03-13 VITALS — BP 142/70 | HR 83 | Temp 98.0°F | Ht 62.0 in

## 2017-03-13 DIAGNOSIS — G894 Chronic pain syndrome: Secondary | ICD-10-CM

## 2017-03-13 DIAGNOSIS — M150 Primary generalized (osteo)arthritis: Secondary | ICD-10-CM

## 2017-03-13 DIAGNOSIS — Z96651 Presence of right artificial knee joint: Secondary | ICD-10-CM

## 2017-03-13 DIAGNOSIS — I10 Essential (primary) hypertension: Secondary | ICD-10-CM | POA: Diagnosis not present

## 2017-03-13 DIAGNOSIS — E039 Hypothyroidism, unspecified: Secondary | ICD-10-CM | POA: Diagnosis not present

## 2017-03-13 DIAGNOSIS — M159 Polyosteoarthritis, unspecified: Secondary | ICD-10-CM

## 2017-03-13 NOTE — Progress Notes (Signed)
Subjective:    Patient ID: Paula Ferguson, female    DOB: May 17, 1940, 77 y.o.   MRN: [id number]  HPI  77 year old patient who is seen today for follow-up.  She is status post AVR repair in 2003 and has done remarkably well on chronic Coumadin anticoagulation.  No other recent lab. She has chronic pain syndrome secondary to generalized osteoarthritis.  She is status post multiple orthopedic procedures.  She is followed closely by orthopedics.  She has hypothyroidism and essential hypertension.  Past Medical History:  Diagnosis Date  . ANEMIA 09/15/2009  . Blood transfusion 2000's; 11/17/2011   S/P "one of my other shoulder surgeries"; S/P left shoulder replacement  . CORONARY ARTERY DISEASE 08/26/2007   LOV Dr Tamala Julian 1/13 with EKG and clearance on chart  . Depression   . Heart murmur    hx- surgery  . HYPERLIPIDEMIA 02/07/2007  . HYPERTENSION 09/14/2006  . HYPOTHYROIDISM 09/14/2006  . LIVER FUNCTION TESTS, ABNORMAL, HX OF 01/12/2009  . OLECRANON BURSITIS, LEFT 08/26/2007  . OSTEOARTHRITIS 02/07/2007   "thumbs; knees; shoulders; ?back"  . Pneumonia    hx  . PTSD (post-traumatic stress disorder) 1976     Social History   Socioeconomic History  . Marital status: Widowed    Spouse name: Not on file  . Number of children: Not on file  . Years of education: Not on file  . Highest education level: Not on file  Social Needs  . Financial resource strain: Not on file  . Food insecurity - worry: Not on file  . Food insecurity - inability: Not on file  . Transportation needs - medical: Not on file  . Transportation needs - non-medical: Not on file  Occupational History  . Not on file  Tobacco Use  . Smoking status: Never Smoker  . Smokeless tobacco: Never Used  Substance and Sexual Activity  . Alcohol use: No  . Drug use: No  . Sexual activity: Not Currently  Other Topics Concern  . Not on file  Social History Narrative  . Not on file    Past Surgical History:  Procedure  Laterality Date  . ABDOMINAL HYSTERECTOMY  1981  . APPENDECTOMY  1981  . CARDIAC CATHETERIZATION  2003  . CARDIAC VALVE REPLACEMENT  2003   "aortic"  . CARPAL TUNNEL RELEASE  1990's   bilaterally  . CHOLECYSTECTOMY  1990's  . COLONOSCOPY    . CORONARY ARTERY BYPASS GRAFT  2003   CABG X1 "mammary" w/aortic valve replacement   . DILATION AND CURETTAGE OF UTERUS  1981?  . EXPLORATORY LAPAROTOMY  ~ 1981  . FRACTURE SURGERY    . JOINT REPLACEMENT     right shoulder arthroscopy, left partial knee replacement  . KNEE ARTHROSCOPY  1980's; 2012   left; right  . PARTIAL KNEE ARTHROPLASTY  1990's   left  . REVERSE SHOULDER ARTHROPLASTY  11/17/2011   Procedure: REVERSE SHOULDER ARTHROPLASTY;  Surgeon: Augustin Schooling, MD;  Location: [place];  Service: Orthopedics;  Laterality: Left;  left reverse shoulder arthroplasty  . SHOULDER ARTHROSCOPY  2000's   right; "3 surgeries";  Marland Kitchen SHOULDER ARTHROSCOPY W/ ROTATOR CUFF REPAIR  1990's  . STERIOD INJECTION  11/17/2011   Procedure: STEROID INJECTION;  Surgeon: Augustin Schooling, MD;  Location: [place];  Service: Orthopedics;  Laterality: Right;  cortizone injection right thumb  . TONSILLECTOMY AND ADENOIDECTOMY  ~ 1964  . TOTAL KNEE ARTHROPLASTY  03/21/2011   Procedure: TOTAL KNEE ARTHROPLASTY;  Surgeon: Pietro Cassis  Alvan Dame, MD;  Location: [facility];  Service: Orthopedics;  Laterality: Right;  . TOTAL SHOULDER REPLACEMENT  2012; 11/17/2011   right; left  . TRANSFER / TRANSPLANT ANKLE TENDON SUPERFICIAL / DEEP  1990's   "bunch of surgeries after dr ruptured posterior tibial tendon", right foot  . v43.3     heart valve    Family History  Problem Relation Age of Onset  . Lung disease Mother   . Lung disease Father   . Healthy Sister   . Kidney disease Brother     Allergies  Allergen Reactions  . Hydromorphone Hcl Itching    "Dilaudid"  . Meperidine Hcl Itching  . Morphine Sulfate Itching    Current Outpatient Medications on File Prior to Visit    Medication Sig Dispense Refill  . amoxicillin (AMOXIL) 500 MG capsule TAKE 4 TABLETS BY MOUTH 1/2 HOUR PRIOR TO DENTAL APPOINTMENT. 8 capsule 0  . BIOTIN PO Take 1 tablet by mouth daily.    Marland Kitchen buPROPion (WELLBUTRIN SR) 150 MG 12 hr tablet TAKE 1 TABLET BY MOUTH DAILY. 90 tablet 4  . Calcium Citrate-Vitamin D (CITRACAL PETITES/VITAMIN D PO) Take 1 tablet by mouth daily.     . cholecalciferol (VITAMIN D) 1000 UNITS tablet Take 1,000 Units by mouth daily.    . Cyanocobalamin (VITAMIN B-12 PO) Take 1 tablet by mouth daily.    . ferrous sulfate 325 (65 FE) MG tablet Take 325 mg by mouth daily with breakfast.     . HYDROcodone-acetaminophen (NORCO) 7.5-325 MG tablet Take 1 tablet by mouth every 6 (six) hours as needed for moderate pain. 120 tablet 0  . levothyroxine (SYNTHROID, LEVOTHROID) 100 MCG tablet TAKE 1 TABLET BY MOUTH DAILY. 90 tablet 2  . losartan (COZAAR) 100 MG tablet TAKE 1 TABLET BY MOUTH DAILY. 90 tablet 5  . methocarbamol (ROBAXIN) 500 MG tablet TAKE 1 TABLET BY MOUTH 3 TIMES A DAY AS NEEDED. 60 tablet 3  . Multiple Vitamins-Minerals (CENTRUM PO) Take 1 tablet by mouth daily.    . Omega-3 Fatty Acids (FISH OIL PO) Take 1 tablet by mouth daily.    . traZODone (DESYREL) 100 MG tablet TAKE 1 TABLET BY MOUTH AT BEDTIME AS NEEDED FOR SLEEP. 90 tablet 2  . verapamil (CALAN-SR) 240 MG CR tablet TAKE 1/2 TABLET BY MOUTH AT BEDTIME. 90 tablet 1  . warfarin (COUMADIN) 5 MG tablet TAKE 1 TABLET BY MOUTH AS DIRECTED. 90 tablet 2   No current facility-administered medications on file prior to visit.     BP (!) 142/70 (BP Location: Left Arm, Patient Position: Sitting, Cuff Size: Normal)   Pulse 83   Temp 98 F (36.7 C) (Oral)   Ht 5\' 2"  (1.575 m)   SpO2 97%   BMI 22.20 kg/m     Review of Systems  Constitutional: Positive for fatigue.  HENT: Negative for congestion, dental problem, hearing loss, rhinorrhea, sinus pressure, sore throat and tinnitus.   Eyes: Negative for pain, discharge  and visual disturbance.  Respiratory: Negative for cough and shortness of breath.   Cardiovascular: Negative for chest pain, palpitations and leg swelling.  Gastrointestinal: Negative for abdominal distention, abdominal pain, blood in stool, constipation, diarrhea, nausea and vomiting.  Genitourinary: Negative for difficulty urinating, dysuria, flank pain, frequency, hematuria, pelvic pain, urgency, vaginal bleeding, vaginal discharge and vaginal pain.  Musculoskeletal: Positive for arthralgias and back pain. Negative for gait problem and joint swelling.  Skin: Negative for rash.  Neurological: Positive for weakness. Negative for dizziness,  syncope, speech difficulty, numbness and headaches.  Hematological: Negative for adenopathy.  Psychiatric/Behavioral: Negative for agitation, behavioral problems and dysphoric mood. The patient is not nervous/anxious.        Objective:   Physical Exam  Constitutional: She is oriented to person, place, and time. She appears well-developed and well-nourished. No distress.  Blood pressure 132/70  HENT:  Head: Normocephalic.  Right Ear: External ear normal.  Left Ear: External ear normal.  Mouth/Throat: Oropharynx is clear and moist.  Eyes: Conjunctivae and EOM are normal. Pupils are equal, round, and reactive to light.  Neck: Normal range of motion. Neck supple. No thyromegaly present.  Cardiovascular: Normal rate, regular rhythm, normal heart sounds and intact distal pulses.  Pedal pulses are full  Prosthetic heart sounds No significant murmur  Pulmonary/Chest: Effort normal and breath sounds normal. No respiratory distress. She has no wheezes. She has no rales.  Abdominal: Soft. Bowel sounds are normal. She exhibits no mass. There is no tenderness.  Musculoskeletal: Normal range of motion. She exhibits edema.  Marked degenerative joint changes most marked in the small joints of the hands  Lymphadenopathy:    She has no cervical adenopathy.    Neurological: She is alert and oriented to person, place, and time.  Skin: Skin is warm and dry. No rash noted.  Psychiatric: She has a normal mood and affect. Her behavior is normal.          Assessment & Plan:   Diffuse osteoarthritis with chronic pain Status post multiple orthopedic procedures Status post AVR Chronic Coumadin anticoagulation Hypothyroidism Essential hypertension stable  Will check updated lab medications refilled  Nyoka Cowden

## 2017-03-13 NOTE — Patient Instructions (Signed)
Limit your sodium (Salt) intake  Keep your legs elevated as much as possible  Please check your blood pressure on a regular basis.  If it is consistently greater than 140/90, please make an office appointment.  Return in 6 months for follow-up

## 2017-03-14 LAB — CBC WITH DIFFERENTIAL/PLATELET
Basophils Absolute: 0 10*3/uL (ref 0.0–0.1)
Basophils Relative: 0.2 % (ref 0.0–3.0)
EOS ABS: 0.1 10*3/uL (ref 0.0–0.7)
Eosinophils Relative: 2.7 % (ref 0.0–5.0)
HCT: 36.6 % (ref 36.0–46.0)
Hemoglobin: 12.4 g/dL (ref 12.0–15.0)
LYMPHS ABS: 0.8 10*3/uL (ref 0.7–4.0)
Lymphocytes Relative: 25.1 % (ref 12.0–46.0)
MCHC: 33.9 g/dL (ref 30.0–36.0)
MCV: 94.9 fl (ref 78.0–100.0)
MONO ABS: 0.4 10*3/uL (ref 0.1–1.0)
Monocytes Relative: 13.2 % — ABNORMAL HIGH (ref 3.0–12.0)
NEUTROS PCT: 58.8 % (ref 43.0–77.0)
Neutro Abs: 1.8 10*3/uL (ref 1.4–7.7)
Platelets: 145 10*3/uL — ABNORMAL LOW (ref 150.0–400.0)
RBC: 3.85 Mil/uL — AB (ref 3.87–5.11)
RDW: 14.5 % (ref 11.5–15.5)
WBC: 3.1 10*3/uL — ABNORMAL LOW (ref 4.0–10.5)

## 2017-03-14 LAB — COMPREHENSIVE METABOLIC PANEL
ALBUMIN: 3.7 g/dL (ref 3.5–5.2)
ALK PHOS: 92 U/L (ref 39–117)
ALT: 20 U/L (ref 0–35)
AST: 44 U/L — ABNORMAL HIGH (ref 0–37)
BILIRUBIN TOTAL: 1.3 mg/dL — AB (ref 0.2–1.2)
BUN: 15 mg/dL (ref 6–23)
CO2: 26 mEq/L (ref 19–32)
CREATININE: 0.82 mg/dL (ref 0.40–1.20)
Calcium: 9.7 mg/dL (ref 8.4–10.5)
Chloride: 105 mEq/L (ref 96–112)
GFR: 72.15 mL/min (ref >60.00)
GLUCOSE: 77 mg/dL (ref 70–99)
Potassium: 4 mEq/L (ref 3.5–5.1)
Sodium: 139 mEq/L (ref 135–145)
TOTAL PROTEIN: 7 g/dL (ref 6.0–8.3)

## 2017-03-14 LAB — TSH: TSH: 0.38 u[IU]/mL (ref 0.35–4.50)

## 2017-03-15 LAB — LIPID PANEL
Cholesterol: 155 mg/dL (ref 0–200)
HDL: 45.9 mg/dL (ref >39.00)
LDL Cholesterol: 91 mg/dL (ref 0–99)
NONHDL: 108.67
Total CHOL/HDL Ratio: 3
Triglycerides: 86 mg/dL (ref 0.0–149.0)
VLDL: 17.2 mg/dL (ref 0.0–40.0)

## 2017-03-16 DIAGNOSIS — Z96651 Presence of right artificial knee joint: Secondary | ICD-10-CM | POA: Diagnosis not present

## 2017-03-16 DIAGNOSIS — Z96652 Presence of left artificial knee joint: Secondary | ICD-10-CM | POA: Diagnosis not present

## 2017-03-22 ENCOUNTER — Other Ambulatory Visit (HOSPITAL_COMMUNITY): Payer: Self-pay | Admitting: Orthopedic Surgery

## 2017-03-22 DIAGNOSIS — Z96651 Presence of right artificial knee joint: Secondary | ICD-10-CM

## 2017-03-28 ENCOUNTER — Ambulatory Visit: Payer: Self-pay | Admitting: *Deleted

## 2017-03-28 NOTE — Telephone Encounter (Signed)
Advised [facility] triage RN that Paula Ferguson does not have xray. She called pt and redirected them to an UC facility.

## 2017-03-28 NOTE — Telephone Encounter (Signed)
Pt stated that she had fallen on Monday or Tuesday and feels like she has a fx rib on the right side. She is also taking a blood thinner. Pt denies bruising on the right side but she has some swelling. She states that it hurts to try to take a deep breath. According to protocol she should go to the the ED or Urgent Care. She does not want to do this so, she is going to [facility] on [address] to be checked out. She voiced understanding that she needs to be seen especially with being on a blood thinner. Will give her a call back in an hour to follow up.  Reason for Disposition . [1] Can't take a deep breath BUT [2] no respiratory distress  Answer Assessment - Initial Assessment Questions 1. MECHANISM: "How did the injury happen?"     slipped on a piece of paper and fell on the right side 2. ONSET: "When did the injury happen?" (Minutes or hours ago)     yesterday 3. LOCATION: "Where on the chest is the injury located?"     Right rib cage under the right arm 4. APPEARANCE: "What does the injury look like?"     Feels like it is a little swollen 5. BLEEDING: "Is there any bleeding now? If so, ask: How long has it been bleeding?"     no 6. SEVERITY: "Any difficulty with breathing?"     Hurts to take a deep breath 7. SIZE: For cuts, bruises, or swelling, ask: "How large is it?" (e.g., inches or centimeters)     No bruises noted 8. PAIN: "Is there pain?" If so, ask: "How bad is the pain?"   (e.g., Scale 1-10; or mild, moderate, severe)     #10 9. TETANUS: For any breaks in the skin, ask: "When was the last tetanus booster?"     No breaks in the skin 10. PREGNANCY: "Is there any chance you are pregnant?" "When was your last menstrual period?"       no  Protocols used: CHEST INJURY-A-AH

## 2017-03-28 NOTE — Telephone Encounter (Signed)
Called Paula Ferguson back and she has decided to go to [facility]  on [facility] in the morning.

## 2017-03-29 ENCOUNTER — Encounter (HOSPITAL_COMMUNITY): Payer: Self-pay | Admitting: Family Medicine

## 2017-03-29 ENCOUNTER — Ambulatory Visit: Payer: Medicare Other | Admitting: Adult Health

## 2017-03-29 ENCOUNTER — Ambulatory Visit (INDEPENDENT_AMBULATORY_CARE_PROVIDER_SITE_OTHER): Payer: Medicare Other

## 2017-03-29 ENCOUNTER — Ambulatory Visit (HOSPITAL_COMMUNITY)
Admission: EM | Admit: 2017-03-29 | Discharge: 2017-03-29 | Disposition: A | Discharge status: Home / Self Care | Payer: Medicare Other | Attending: Family Medicine | Admitting: Family Medicine

## 2017-03-29 DIAGNOSIS — Z0289 Encounter for other administrative examinations: Secondary | ICD-10-CM

## 2017-03-29 DIAGNOSIS — R079 Chest pain, unspecified: Secondary | ICD-10-CM | POA: Diagnosis not present

## 2017-03-29 DIAGNOSIS — R0782 Intercostal pain: Secondary | ICD-10-CM

## 2017-03-29 DIAGNOSIS — S20211A Contusion of right front wall of thorax, initial encounter: Secondary | ICD-10-CM

## 2017-03-29 DIAGNOSIS — M25511 Pain in right shoulder: Secondary | ICD-10-CM | POA: Diagnosis not present

## 2017-03-29 DIAGNOSIS — S299XXA Unspecified injury of thorax, initial encounter: Secondary | ICD-10-CM | POA: Diagnosis not present

## 2017-03-29 DIAGNOSIS — S4991XA Unspecified injury of right shoulder and upper arm, initial encounter: Secondary | ICD-10-CM | POA: Diagnosis not present

## 2017-03-29 NOTE — ED Triage Notes (Signed)
Pt here for fall that occurred Monday. reports that she has bad knees and that her knees gave out on her. She landed more on the right side around right arm and rib area. Denies hitting head or LOC.

## 2017-03-29 NOTE — Discharge Instructions (Signed)
You may use over the counter acetaminophen as needed. Usually rib pain resolves in 1-2 weeks.

## 2017-04-03 NOTE — ED Provider Notes (Signed)
[place]   [id number] 03/29/17 Arrival Time: [id number]  ASSESSMENT & PLAN:  1. Rib contusion, right, initial encounter   2. Acute pain of right shoulder     Imaging: Dg Ribs Unilateral W/chest Right  Result Date: 03/29/2017 CLINICAL DATA:  Pain following fall EXAM: RIGHT RIBS AND CHEST - 3+ VIEW COMPARISON:  Chest radiograph March 14, 2011 FINDINGS: Frontal chest as well as oblique and cone-down rib images obtained. There is no edema or consolidation. The heart size and pulmonary vascularity are normal. No. Patient is status post aortic valve replacement. There is aortic atherosclerosis. There are total shoulder replacements bilaterally. There is no evident pneumothorax or pleural effusion. No rib fracture apparent. IMPRESSION: No rib fracture evident. There are total shoulder replacements. No edema or consolidation. Status post aortic valve replacement. There is aortic atherosclerosis. Aortic Atherosclerosis (ICD10-I70.0). Electronically Signed   By: Lowella Grip III M.D.   On: 03/29/2017 14:34   Dg Shoulder Right  Result Date: 03/29/2017 CLINICAL DATA:  Status post fall 03/25/2017 with a right shoulder injury. Pain. Initial encounter. EXAM: RIGHT SHOULDER - 2+ VIEW COMPARISON:  Single view of the right shoulder 04/29/2010. FINDINGS: There is no evidence of fracture or dislocation. Reverse left shoulder arthroplasty is in place. Extensive cortical bone loss is seen about the humeral stem, worst proximally. Imaged right and lung ribs appear normal. IMPRESSION: No acute abnormality. Extensive cortical bone about the humeral stem is likely do to particle disease is new since the 2012 exam. Electronically Signed   By: Inge Rise M.D.   On: 03/29/2017 14:57   Prefers OTC analgesics. Discussed typical healing time re: rib contusion. May f/u as needed.  Reviewed expectations re: course of current medical issues. Questions answered. Outlined signs and symptoms indicating need for  more acute intervention. Patient verbalized understanding. After Visit Summary given.  SUBJECTIVE: History from: patient. Paula Ferguson is a 77 y.o. female who reports diffuse mild to moderate pain of her right shoulder and ribs that is stable; persistent; described as aching without radiation. Onset: abrupt, a few days ago. Injury/trama: yes, fall onto her R side. Relieved by: nothing in particular. Worsened by: certain movements. Associated symptoms: none reported. Extremity sensation changes or weakness: none. Self treatment: OTC analgesics with temporary help. History of similar: no  ROS: As per HPI.   OBJECTIVE:  Vitals:   03/29/17 1330  BP: (!) 152/75  Pulse: 79  Resp: 18  Temp: 97.9 F (36.6 C)  SpO2: 98%    General appearance: alert; no distress Extremities: no cyanosis or edema; symmetrical with no gross deformities; localized tenderness over her right shoulder anteriorly with no swelling and no bruising; ROM: limited by pain Chest Wall: poorly localized tenderness over R ribs Lungs: CTAB; no resp distress CV: normal extremity capillary refill Skin: warm and dry Neurologic: normal gait; normal symmetric reflexes in all extremities; normal sensation in all extremities Psychological: alert and cooperative; normal mood and affect  Allergies  Allergen Reactions  . Hydromorphone Hcl Itching    "Dilaudid"  . Meperidine Hcl Itching  . Morphine Sulfate Itching    Past Medical History:  Diagnosis Date  . ANEMIA 09/15/2009  . Blood transfusion 2000's; 11/17/2011   S/P "one of my other shoulder surgeries"; S/P left shoulder replacement  . CORONARY ARTERY DISEASE 08/26/2007   LOV Dr Tamala Julian 1/13 with EKG and clearance on chart  . Depression   . Heart murmur    hx- surgery  . HYPERLIPIDEMIA 02/07/2007  .  HYPERTENSION 09/14/2006  . HYPOTHYROIDISM 09/14/2006  . LIVER FUNCTION TESTS, ABNORMAL, HX OF 01/12/2009  . OLECRANON BURSITIS, LEFT 08/26/2007  . OSTEOARTHRITIS  02/07/2007   "thumbs; knees; shoulders; ?back"  . Pneumonia    hx  . PTSD (post-traumatic stress disorder) 1976   Social History   Socioeconomic History  . Marital status: Widowed    Spouse name: Not on file  . Number of children: Not on file  . Years of education: Not on file  . Highest education level: Not on file  Social Needs  . Financial resource strain: Not on file  . Food insecurity - worry: Not on file  . Food insecurity - inability: Not on file  . Transportation needs - medical: Not on file  . Transportation needs - non-medical: Not on file  Occupational History  . Not on file  Tobacco Use  . Smoking status: Never Smoker  . Smokeless tobacco: Never Used  Substance and Sexual Activity  . Alcohol use: No  . Drug use: No  . Sexual activity: Not Currently  Other Topics Concern  . Not on file  Social History Narrative  . Not on file   Family History  Problem Relation Age of Onset  . Lung disease Mother   . Lung disease Father   . Healthy Sister   . Kidney disease Brother    Past Surgical History:  Procedure Laterality Date  . ABDOMINAL HYSTERECTOMY  1981  . APPENDECTOMY  1981  . CARDIAC CATHETERIZATION  2003  . CARDIAC VALVE REPLACEMENT  2003   "aortic"  . CARPAL TUNNEL RELEASE  1990's   bilaterally  . CHOLECYSTECTOMY  1990's  . COLONOSCOPY    . CORONARY ARTERY BYPASS GRAFT  2003   CABG X1 "mammary" w/aortic valve replacement   . DILATION AND CURETTAGE OF UTERUS  1981?  . EXPLORATORY LAPAROTOMY  ~ 1981  . FRACTURE SURGERY    . JOINT REPLACEMENT     right shoulder arthroscopy, left partial knee replacement  . KNEE ARTHROSCOPY  1980's; 2012   left; right  . PARTIAL KNEE ARTHROPLASTY  1990's   left  . REVERSE SHOULDER ARTHROPLASTY  11/17/2011   Procedure: REVERSE SHOULDER ARTHROPLASTY;  Surgeon: Augustin Schooling, MD;  Location: [place];  Service: Orthopedics;  Laterality: Left;  left reverse shoulder arthroplasty  . SHOULDER ARTHROSCOPY  2000's   right;  "3 surgeries";  Marland Kitchen SHOULDER ARTHROSCOPY W/ ROTATOR CUFF REPAIR  1990's  . STERIOD INJECTION  11/17/2011   Procedure: STEROID INJECTION;  Surgeon: Augustin Schooling, MD;  Location: [place];  Service: Orthopedics;  Laterality: Right;  cortizone injection right thumb  . TONSILLECTOMY AND ADENOIDECTOMY  ~ 1964  . TOTAL KNEE ARTHROPLASTY  03/21/2011   Procedure: TOTAL KNEE ARTHROPLASTY;  Surgeon: Mauri Pole, MD;  Location: [facility];  Service: Orthopedics;  Laterality: Right;  . TOTAL SHOULDER REPLACEMENT  2012; 11/17/2011   right; left  . TRANSFER / TRANSPLANT ANKLE TENDON SUPERFICIAL / DEEP  1990's   "bunch of surgeries after dr ruptured posterior tibial tendon", right foot  . v43.3     heart valve      Vanessa Kick, MD 04/03/17 [phone]

## 2017-04-06 ENCOUNTER — Encounter (HOSPITAL_COMMUNITY)
Admission: RE | Admit: 2017-04-06 | Discharge: 2017-04-06 | Disposition: A | Discharge status: Home / Self Care | Payer: Medicare Other | Source: Ambulatory Visit | Attending: Orthopedic Surgery | Admitting: Orthopedic Surgery

## 2017-04-06 ENCOUNTER — Ambulatory Visit (HOSPITAL_COMMUNITY)
Admission: RE | Admit: 2017-04-06 | Discharge: 2017-04-06 | Disposition: A | Discharge status: Home / Self Care | Payer: Medicare Other | Source: Ambulatory Visit | Attending: Orthopedic Surgery | Admitting: Orthopedic Surgery

## 2017-04-06 DIAGNOSIS — Z96651 Presence of right artificial knee joint: Secondary | ICD-10-CM | POA: Diagnosis not present

## 2017-04-06 DIAGNOSIS — M1712 Unilateral primary osteoarthritis, left knee: Secondary | ICD-10-CM | POA: Insufficient documentation

## 2017-04-06 DIAGNOSIS — M179 Osteoarthritis of knee, unspecified: Secondary | ICD-10-CM | POA: Diagnosis not present

## 2017-04-06 MED ORDER — TECHNETIUM TC 99M MEDRONATE IV KIT
25.0000 | PACK | Freq: Once | INTRAVENOUS | Status: AC | PRN
  Administered 2017-04-06: 25 via INTRAVENOUS

## 2017-04-10 ENCOUNTER — Ambulatory Visit (INDEPENDENT_AMBULATORY_CARE_PROVIDER_SITE_OTHER): Payer: Medicare Other | Admitting: *Deleted

## 2017-04-10 DIAGNOSIS — Z952 Presence of prosthetic heart valve: Secondary | ICD-10-CM | POA: Diagnosis not present

## 2017-04-10 DIAGNOSIS — Z5181 Encounter for therapeutic drug level monitoring: Secondary | ICD-10-CM

## 2017-04-10 LAB — POCT INR: INR: 2.6

## 2017-04-10 NOTE — Patient Instructions (Signed)
Description   Continue taking 1/2 tablet every day except 1 tablet on Sundays, Tuesdays,  and Thursdays. Recheck INR in 6weeks. Call with new or different meds [phone] or if scheduled for any procedures

## 2017-04-18 DIAGNOSIS — M25561 Pain in right knee: Secondary | ICD-10-CM | POA: Diagnosis not present

## 2017-04-18 DIAGNOSIS — Z96653 Presence of artificial knee joint, bilateral: Secondary | ICD-10-CM | POA: Diagnosis not present

## 2017-04-18 DIAGNOSIS — M25562 Pain in left knee: Secondary | ICD-10-CM | POA: Diagnosis not present

## 2017-05-16 ENCOUNTER — Ambulatory Visit (INDEPENDENT_AMBULATORY_CARE_PROVIDER_SITE_OTHER): Payer: Medicare Other | Admitting: *Deleted

## 2017-05-16 DIAGNOSIS — Z952 Presence of prosthetic heart valve: Secondary | ICD-10-CM | POA: Diagnosis not present

## 2017-05-16 LAB — POCT INR: INR: 2.7

## 2017-05-16 NOTE — Patient Instructions (Signed)
Description   Continue taking 1/2 tablet every day except 1 tablet on Sundays, Tuesdays, and Thursdays. Recheck INR in 6 weeks. Call with new or different meds [phone] or if scheduled for any procedures

## 2017-05-28 ENCOUNTER — Other Ambulatory Visit: Payer: Self-pay | Admitting: Internal Medicine

## 2017-06-04 ENCOUNTER — Other Ambulatory Visit: Payer: Self-pay | Admitting: Internal Medicine

## 2017-06-18 ENCOUNTER — Inpatient Hospital Stay: Admit: 2017-06-18 | Payer: Medicare Other | Admitting: Orthopedic Surgery

## 2017-06-18 SURGERY — CONVERSION, ARTHROPLASTY, KNEE, PARTIAL, TO TOTAL KNEE ARTHROPLASTY
Anesthesia: Spinal | Site: Knee | Laterality: Left

## 2017-06-27 ENCOUNTER — Telehealth: Payer: Self-pay | Admitting: Interventional Cardiology

## 2017-06-27 ENCOUNTER — Ambulatory Visit (INDEPENDENT_AMBULATORY_CARE_PROVIDER_SITE_OTHER): Payer: Medicare Other | Admitting: *Deleted

## 2017-06-27 DIAGNOSIS — Z952 Presence of prosthetic heart valve: Secondary | ICD-10-CM | POA: Diagnosis not present

## 2017-06-27 DIAGNOSIS — Z5181 Encounter for therapeutic drug level monitoring: Secondary | ICD-10-CM

## 2017-06-27 LAB — POCT INR: INR: 2.1

## 2017-06-27 NOTE — Patient Instructions (Signed)
Description   Continue taking 1/2 tablet every day except 1 tablet on Sundays, Tuesdays, and Thursdays. Recheck INR in 4 weeks. Call with new or different meds [phone] or if scheduled for any procedures

## 2017-06-27 NOTE — Telephone Encounter (Signed)
° °  [facility] Pre-operative Risk Assessment    Request for surgical clearance:  1. What type of surgery is being performed? Left knee: TKA-MEDIAL & LATERAL W/WO PATELLA RESURFACING, Left: Removal implant, deep  2. When is this surgery scheduled? 08/02/2017  3. What type of clearance is required (medical clearance vs. Pharmacy clearance to hold med vs. Both)? Both   4. Are there any medications that need to be held prior to surgery and how long? Coumadin, please indicate how long to hold medication prior to surgery.  5. Practice name and name of physician performing surgery? [facility], Dr. Charyl Bigger. [place]  6. What is your office phone number [phone]   7.   What is your office fax number [phone] ATTN: Orson Slick  8.   Anesthesia type (None, local, MAC, general) ? Spinal   Paula Ferguson 06/27/2017, 4:47 PM  _________________________________________________________________   (provider comments below)

## 2017-06-28 NOTE — Telephone Encounter (Signed)
   Primary Cardiologist:Henry Nicholes Stairs III, MD  Chart reviewed as part of pre-operative protocol coverage. Because of TERESIA MYINT past medical history and time since last visit (not seen in cardiology clinic since 2016), he/she will require a follow-up visit in order to better assess preoperative cardiovascular risk and arrange for lovenox bridging.  Pre-op covering staff: - Please schedule appointment and call patient to inform them. - Please contact requesting surgeon's office via preferred method (i.e, phone, fax) to inform them of need for appointment prior to surgery.  Murray Hodgkins, NP  06/28/2017, 4:21 PM

## 2017-06-29 NOTE — Telephone Encounter (Addendum)
Patient is scheduled to see Truitt Merle, NP on 07/03/17 for pre op clearance.  Left message for Orson Slick at [place] updating her on patients appt.

## 2017-07-03 ENCOUNTER — Ambulatory Visit: Payer: Medicare Other | Admitting: Nurse Practitioner

## 2017-07-03 ENCOUNTER — Telehealth: Payer: Self-pay | Admitting: *Deleted

## 2017-07-03 NOTE — Progress Notes (Signed)
CARDIOLOGY OFFICE NOTE  Date:  07/04/2017    Paula Ferguson Date of Birth: 04-14-1940 Medical Record #[id number]  PCP:  Marletta Lor, MD  Cardiologist:  Jennings Books    Chief Complaint  Patient presents with  . Pre-op Exam    Work in visit - seen for Dr. Tamala Julian    History of Present Illness: Paula Ferguson is a 77 y.o. female who presents today for a pre op clearance visit. Seen for Dr. Tamala Julian.   She has a history of prior AVR with CAD with LIMA to LAD in 2003 per E[facility], hypertension, and has been on chronic anticoagulation therapy.  She has not seen Dr. Tamala Julian since June of 2016. She has continued to be followed here in the coumadin clinic.   Comes in today. Here alone initially but then her daughter and grandson joined [place]. She refuses to weigh. She does not wish to go over her medicines. She is scheduled for knee replacement next month with Dr. Alvan Dame. BP pretty high here today. She says she has good control at home - typically in the 810 systolic range. No chest pain. Not short of breath. No syncope. Not really able to complete 4 mets of activity. She has gotten more sedentary due to her orthopedic issues. Unclear if she is on cholesterol medicine.    According to the Revised Cardiac Risk Index (RCRI), her Perioperative Risk of Major Cardiac Event is (%): 0.9  Her Functional Capacity in METs is: 3.97 according to the [facility] Activity Status Index (DASI).  Past Medical History:  Diagnosis Date  . ANEMIA 09/15/2009  . Blood transfusion 2000's; 11/17/2011   S/P "one of my other shoulder surgeries"; S/P left shoulder replacement  . CORONARY ARTERY DISEASE 08/26/2007   LOV Dr Tamala Julian 1/13 with EKG and clearance on chart  . Depression   . Heart murmur    hx- surgery  . HYPERLIPIDEMIA 02/07/2007  . HYPERTENSION 09/14/2006  . HYPOTHYROIDISM 09/14/2006  . LIVER FUNCTION TESTS, ABNORMAL, HX OF 01/12/2009  . OLECRANON BURSITIS, LEFT 08/26/2007  . OSTEOARTHRITIS 02/07/2007   "thumbs; knees; shoulders; ?back"  . Pneumonia    hx  . PTSD (post-traumatic stress disorder) 1976    Past Surgical History:  Procedure Laterality Date  . ABDOMINAL HYSTERECTOMY  1981  . APPENDECTOMY  1981  . CARDIAC CATHETERIZATION  2003  . CARDIAC VALVE REPLACEMENT  2003   "aortic"  . CARPAL TUNNEL RELEASE  1990's   bilaterally  . CHOLECYSTECTOMY  1990's  . COLONOSCOPY    . CORONARY ARTERY BYPASS GRAFT  2003   CABG X1 "mammary" w/aortic valve replacement   . DILATION AND CURETTAGE OF UTERUS  1981?  . EXPLORATORY LAPAROTOMY  ~ 1981  . FRACTURE SURGERY    . JOINT REPLACEMENT     right shoulder arthroscopy, left partial knee replacement  . KNEE ARTHROSCOPY  1980's; 2012   left; right  . PARTIAL KNEE ARTHROPLASTY  1990's   left  . REVERSE SHOULDER ARTHROPLASTY  11/17/2011   Procedure: REVERSE SHOULDER ARTHROPLASTY;  Surgeon: Augustin Schooling, MD;  Location: [facility];  Service: Orthopedics;  Laterality: Left;  left reverse shoulder arthroplasty  . SHOULDER ARTHROSCOPY  2000's   right; "3 surgeries";  Marland Kitchen SHOULDER ARTHROSCOPY W/ ROTATOR CUFF REPAIR  1990's  . STERIOD INJECTION  11/17/2011   Procedure: STEROID INJECTION;  Surgeon: Augustin Schooling, MD;  Location: [facility];  Service: Orthopedics;  Laterality: Right;  cortizone injection right thumb  .  TONSILLECTOMY AND ADENOIDECTOMY  ~ 1964  . TOTAL KNEE ARTHROPLASTY  03/21/2011   Procedure: TOTAL KNEE ARTHROPLASTY;  Surgeon: Mauri Pole, MD;  Location: [facility];  Service: Orthopedics;  Laterality: Right;  . TOTAL SHOULDER REPLACEMENT  2012; 11/17/2011   right; left  . TRANSFER / TRANSPLANT ANKLE TENDON SUPERFICIAL / DEEP  1990's   "bunch of surgeries after dr ruptured posterior tibial tendon", right foot  . v43.3     heart valve     Medications: Current Meds  Medication Sig  . amoxicillin (AMOXIL) 500 MG capsule TAKE 4 TABLETS BY MOUTH 1/2 HOUR PRIOR TO DENTAL APPOINTMENT.  Marland Kitchen BIOTIN PO Take 1 tablet by mouth daily.  Marland Kitchen buPROPion  (WELLBUTRIN SR) 150 MG 12 hr tablet TAKE 1 TABLET BY MOUTH DAILY.  . Calcium Citrate-Vitamin D (CITRACAL PETITES/VITAMIN D PO) Take 1 tablet by mouth daily.   . cholecalciferol (VITAMIN D) 1000 UNITS tablet Take 1,000 Units by mouth daily.  . Cyanocobalamin (VITAMIN B-12 PO) Take 1 tablet by mouth daily.  Marland Kitchen HYDROcodone-acetaminophen (NORCO) 7.5-325 MG tablet Take 1 tablet by mouth every 6 (six) hours as needed for moderate pain.  Marland Kitchen levothyroxine (SYNTHROID, LEVOTHROID) 100 MCG tablet TAKE 1 TABLET BY MOUTH DAILY.  Marland Kitchen losartan (COZAAR) 100 MG tablet TAKE 1 TABLET BY MOUTH DAILY.  . methocarbamol (ROBAXIN) 500 MG tablet TAKE 1 TABLET BY MOUTH 3 TIMES A DAY AS NEEDED.  . Multiple Vitamins-Minerals (CENTRUM PO) Take 1 tablet by mouth daily.  . traZODone (DESYREL) 100 MG tablet TAKE 1 TABLET BY MOUTH AT BEDTIME AS NEEDED FOR SLEEP.  Marland Kitchen verapamil (CALAN-SR) 240 MG CR tablet TAKE 1/2 TABLET BY MOUTH AT BEDTIME.  Marland Kitchen warfarin (COUMADIN) 5 MG tablet TAKE 1 TABLET BY MOUTH AS DIRECTED.     Allergies: Allergies  Allergen Reactions  . Hydromorphone Hcl Itching    "Dilaudid"  . Meperidine Hcl Itching  . Morphine Sulfate Itching    Social History: The patient  reports that she has never smoked. She has never used smokeless tobacco. She reports that she does not drink alcohol or use drugs.   Family History: The patient's family history includes Healthy in her sister; Kidney disease in her brother; Lung disease in her father and mother.   Review of Systems: Please see the history of present illness.   Otherwise, the review of systems is positive for none.   All other systems are reviewed and negative.   Physical Exam: VS:  BP (!) 172/70 (BP Location: Left Arm, Patient Position: Sitting, Cuff Size: Normal)   Pulse 83   Ht 5\' 3"  (1.6 m)   BMI 21.51 kg/m  .  BMI Body mass index is 21.51 kg/m.  Wt Readings from Last 3 Encounters:  03/01/17 121 lb 6.4 oz (55.1 kg)  11/28/16 120 lb (54.4 kg)    07/21/16 118 lb 9.6 oz (53.8 kg)    General: Alert and in no acute distress. She looks older than her stated age.  HEENT: Normal.  Neck: Supple, no JVD, carotid bruits, or masses noted.  Cardiac: Regular rate and rhythm. Crisp aortic valve - no murmur that I can appreciate. No edema.  Respiratory:  Lungs are clear to auscultation bilaterally with normal work of breathing.  GI: Soft and nontender.  MS: No deformity or atrophy. Gait and ROM intact.  Skin: Warm and dry. Color is normal.  Neuro:  Strength and sensation are intact and no gross focal deficits noted.  Psych: Alert, appropriate and with  normal affect.   LABORATORY DATA:  EKG:  EKG is ordered today. This demonstrates NSR with LAHB - poor R wave progression - unchanged - reviewed with Dr. Tamala Julian today.  Lab Results  Component Value Date   WBC 3.1 (L) 03/13/2017   HGB 12.4 03/13/2017   HCT 36.6 03/13/2017   PLT 145.0 (L) 03/13/2017   GLUCOSE 77 03/13/2017   CHOL 155 03/13/2017   TRIG 86.0 03/13/2017   HDL 45.90 03/13/2017   LDLCALC 91 03/13/2017   ALT 20 03/13/2017   AST 44 (H) 03/13/2017   NA 139 03/13/2017   K 4.0 03/13/2017   CL 105 03/13/2017   CREATININE 0.82 03/13/2017   BUN 15 03/13/2017   CO2 26 03/13/2017   TSH 0.38 03/13/2017   INR 2.1 06/27/2017     BNP (last 3 results) No results for input(s): BNP in the last 8760 hours.  ProBNP (last 3 results) No results for input(s): PROBNP in the last 8760 hours.   Other Studies Reviewed Today:  Echo Study Conclusions from 09/2015  - Left ventricle: The cavity size was normal. There was moderate   focal basal hypertrophy of the septum with mild hypertrophy of   the posterior wall. Systolic function was normal. The estimated   ejection fraction was in the range of 60% to 65%. Wall motion was   normal; there were no regional wall motion abnormalities. Doppler   parameters are consistent with abnormal left ventricular   relaxation (grade 1 diastolic  dysfunction). Doppler parameters   are consistent with indeterminate ventricular filling pressure. - Aortic valve: A mechanical prosthesis was present. Transvalvular   velocity was within the normal range. There was no stenosis.   There was trivial regurgitation. Mean gradient (S): 6 mm Hg. - Mitral valve: Transvalvular velocity was within the normal range.   There was no evidence for stenosis. There was mild regurgitation. - Right ventricle: The cavity size was normal. Wall thickness was   normal. Systolic function was normal. - Atrial septum: A patent foramen ovale cannot be excluded. - Tricuspid valve: There was moderate regurgitation. - Pulmonary arteries: Systolic pressure was within the normal   range. PA peak pressure: 28 mm Hg (S).  Assessment/Plan:  1. Pre op clearance - no current cardiac symptoms - but not really able to do 4 mets of activity - discussed with Dr. Tamala Julian - we feel stress testing is indicated given the age of her bypass graft. Will get echo updated as well. She will need bridging with Lovenox for surgery by the coumadin clinic.   2. Remote AVR/CABG - see #1.   3. HTN - she is adamant that she has good BP control at home.   4. HLD - unclear to me if she is on statin - she did not wish to review her current list of medicines with me today. She is seeing PCP later today. Labs from earlier this year noted.   5. Chronic coumadin - no problems noted - she will need Lovenox bridge for surgery.   Current medicines are reviewed with the patient today.  The patient does not have concerns regarding medicines other than what has been noted above.  The following changes have been made:  See above.  Labs/ tests ordered today include:    Orders Placed This Encounter  Procedures  . MYOCARDIAL PERFUSION IMAGING  . EKG 12-Lead  . ECHOCARDIOGRAM COMPLETE     Disposition:   FU with Dr. Tamala Julian in about 4 months.  Patient is agreeable to this plan and will call if any  problems develop in the interim.   SignedTruitt Merle, NP  07/04/2017 2:13 PM  [facility] [address] [place], [place]  [address] Phone: [phone] Fax: [phone]

## 2017-07-03 NOTE — Telephone Encounter (Signed)
lvm that pt missed appt for surgical clearance, call office to reschedule.

## 2017-07-03 NOTE — Progress Notes (Deleted)
CARDIOLOGY OFFICE NOTE  Date:  07/03/2017    Paula Ferguson Date of Birth: 1941-02-05 Medical Record #[id number]  PCP:  Marletta Lor, MD  Cardiologist:  Zack Seal chief complaint on file.   History of Present Illness: Paula Ferguson is a 77 y.o. female who presents today for a work in/pre op clearance visit. Seen for Dr. Tamala Julian.   She has a history of AVR (2003) with aortic mechanical valve, CAD with prior CABG with LIMA to LAD (2003), hypertension, and is on chronic anticoagulation therapy.  She has not been seen here since June of 2016.   Now needing clearance for knee surgery.   Comes in today. Here with   Past Medical History:  Diagnosis Date  . ANEMIA 09/15/2009  . Blood transfusion 2000's; 11/17/2011   S/P "one of my other shoulder surgeries"; S/P left shoulder replacement  . CORONARY ARTERY DISEASE 08/26/2007   LOV Dr Tamala Julian 1/13 with EKG and clearance on chart  . Depression   . Heart murmur    hx- surgery  . HYPERLIPIDEMIA 02/07/2007  . HYPERTENSION 09/14/2006  . HYPOTHYROIDISM 09/14/2006  . LIVER FUNCTION TESTS, ABNORMAL, HX OF 01/12/2009  . OLECRANON BURSITIS, LEFT 08/26/2007  . OSTEOARTHRITIS 02/07/2007   "thumbs; knees; shoulders; ?back"  . Pneumonia    hx  . PTSD (post-traumatic stress disorder) 1976    Past Surgical History:  Procedure Laterality Date  . ABDOMINAL HYSTERECTOMY  1981  . APPENDECTOMY  1981  . CARDIAC CATHETERIZATION  2003  . CARDIAC VALVE REPLACEMENT  2003   "aortic"  . CARPAL TUNNEL RELEASE  1990's   bilaterally  . CHOLECYSTECTOMY  1990's  . COLONOSCOPY    . CORONARY ARTERY BYPASS GRAFT  2003   CABG X1 "mammary" w/aortic valve replacement   . DILATION AND CURETTAGE OF UTERUS  1981?  . EXPLORATORY LAPAROTOMY  ~ 1981  . FRACTURE SURGERY    . JOINT REPLACEMENT     right shoulder arthroscopy, left partial knee replacement  . KNEE ARTHROSCOPY  1980's; 2012   left; right  . PARTIAL KNEE ARTHROPLASTY  1990's   left   . REVERSE SHOULDER ARTHROPLASTY  11/17/2011   Procedure: REVERSE SHOULDER ARTHROPLASTY;  Surgeon: Augustin Schooling, MD;  Location: [facility];  Service: Orthopedics;  Laterality: Left;  left reverse shoulder arthroplasty  . SHOULDER ARTHROSCOPY  2000's   right; "3 surgeries";  Marland Kitchen SHOULDER ARTHROSCOPY W/ ROTATOR CUFF REPAIR  1990's  . STERIOD INJECTION  11/17/2011   Procedure: STEROID INJECTION;  Surgeon: Augustin Schooling, MD;  Location: [facility];  Service: Orthopedics;  Laterality: Right;  cortizone injection right thumb  . TONSILLECTOMY AND ADENOIDECTOMY  ~ 1964  . TOTAL KNEE ARTHROPLASTY  03/21/2011   Procedure: TOTAL KNEE ARTHROPLASTY;  Surgeon: Mauri Pole, MD;  Location: [facility];  Service: Orthopedics;  Laterality: Right;  . TOTAL SHOULDER REPLACEMENT  2012; 11/17/2011   right; left  . TRANSFER / TRANSPLANT ANKLE TENDON SUPERFICIAL / DEEP  1990's   "bunch of surgeries after dr ruptured posterior tibial tendon", right foot  . v43.3     heart valve     Medications: No outpatient medications have been marked as taking for the 07/03/17 encounter (Appointment) with Burtis Junes, NP.     Allergies: Allergies  Allergen Reactions  . Hydromorphone Hcl Itching    "Dilaudid"  . Meperidine Hcl Itching  . Morphine Sulfate Itching    Social History: The patient  reports  that she has never smoked. She has never used smokeless tobacco. She reports that she does not drink alcohol or use drugs.   Family History: The patient's family history includes Healthy in her sister; Kidney disease in her brother; Lung disease in her father and mother.   Review of Systems: Please see the history of present illness.   Otherwise, the review of systems is positive for none.   All other systems are reviewed and negative.   Physical Exam: VS:  There were no vitals taken for this visit. Marland Kitchen  BMI There is no height or weight on file to calculate BMI.  Wt Readings from Last 3 Encounters:  03/01/17 121 lb 6.4 oz  (55.1 kg)  11/28/16 120 lb (54.4 kg)  07/21/16 118 lb 9.6 oz (53.8 kg)    General: Pleasant. Well developed, well nourished and in no acute distress.   HEENT: Normal.  Neck: Supple, no JVD, carotid bruits, or masses noted.  Cardiac: Regular rate and rhythm. No murmurs, rubs, or gallops. No edema.  Respiratory:  Lungs are clear to auscultation bilaterally with normal work of breathing.  GI: Soft and nontender.  MS: No deformity or atrophy. Gait and ROM intact.  Skin: Warm and dry. Color is normal.  Neuro:  Strength and sensation are intact and no gross focal deficits noted.  Psych: Alert, appropriate and with normal affect.   LABORATORY DATA:  EKG:  EKG is ordered today. This demonstrates .  Lab Results  Component Value Date   WBC 3.1 (L) 03/13/2017   HGB 12.4 03/13/2017   HCT 36.6 03/13/2017   PLT 145.0 (L) 03/13/2017   GLUCOSE 77 03/13/2017   CHOL 155 03/13/2017   TRIG 86.0 03/13/2017   HDL 45.90 03/13/2017   LDLCALC 91 03/13/2017   ALT 20 03/13/2017   AST 44 (H) 03/13/2017   NA 139 03/13/2017   K 4.0 03/13/2017   CL 105 03/13/2017   CREATININE 0.82 03/13/2017   BUN 15 03/13/2017   CO2 26 03/13/2017   TSH 0.38 03/13/2017   INR 2.1 06/27/2017   Lab Results  Component Value Date   INR 2.1 06/27/2017   INR 2.7 05/16/2017   INR 2.6 04/10/2017    BNP (last 3 results) No results for input(s): BNP in the last 8760 hours.  ProBNP (last 3 results) No results for input(s): PROBNP in the last 8760 hours.   Other Studies Reviewed Today:  Echo Study Conclusions 09/2015  - Left ventricle: The cavity size was normal. There was moderate   focal basal hypertrophy of the septum with mild hypertrophy of   the posterior wall. Systolic function was normal. The estimated   ejection fraction was in the range of 60% to 65%. Wall motion was   normal; there were no regional wall motion abnormalities. Doppler   parameters are consistent with abnormal left ventricular    relaxation (grade 1 diastolic dysfunction). Doppler parameters   are consistent with indeterminate ventricular filling pressure. - Aortic valve: A mechanical prosthesis was present. Transvalvular   velocity was within the normal range. There was no stenosis.   There was trivial regurgitation. Mean gradient (S): 6 mm Hg. - Mitral valve: Transvalvular velocity was within the normal range.   There was no evidence for stenosis. There was mild regurgitation. - Right ventricle: The cavity size was normal. Wall thickness was   normal. Systolic function was normal. - Atrial septum: A patent foramen ovale cannot be excluded. - Tricuspid valve: There was moderate  regurgitation. - Pulmonary arteries: Systolic pressure was within the normal   range. PA peak pressure: 28 mm Hg (S).   Assessment/Plan:  1. Pre op clearance  2. CAD with prior remote CABG  3. History of remote AVR  4. Chronic anticoagulation  Current medicines are reviewed with the patient today.  The patient does not have concerns regarding medicines other than what has been noted above.  The following changes have been made:  See above.  Labs/ tests ordered today include:   No orders of the defined types were placed in this encounter.    Disposition:   FU with *** in {gen number [id number]} {Days to years:10300}.   Patient is agreeable to this plan and will call if any problems develop in the interim.   SignedTruitt Merle, NP  07/03/2017 10:31 AM  [facility] [address] [place], [place]  [address] Phone: [phone] Fax: [phone]

## 2017-07-04 ENCOUNTER — Ambulatory Visit (INDEPENDENT_AMBULATORY_CARE_PROVIDER_SITE_OTHER): Payer: Medicare Other | Admitting: Internal Medicine

## 2017-07-04 ENCOUNTER — Ambulatory Visit (INDEPENDENT_AMBULATORY_CARE_PROVIDER_SITE_OTHER): Payer: Medicare Other | Admitting: Nurse Practitioner

## 2017-07-04 ENCOUNTER — Encounter: Payer: Self-pay | Admitting: Nurse Practitioner

## 2017-07-04 ENCOUNTER — Encounter: Payer: Self-pay | Admitting: Internal Medicine

## 2017-07-04 VITALS — BP 138/78 | HR 91 | Temp 98.5°F

## 2017-07-04 VITALS — BP 172/70 | HR 83 | Ht 63.0 in

## 2017-07-04 DIAGNOSIS — I10 Essential (primary) hypertension: Secondary | ICD-10-CM | POA: Diagnosis not present

## 2017-07-04 DIAGNOSIS — Z952 Presence of prosthetic heart valve: Secondary | ICD-10-CM

## 2017-07-04 DIAGNOSIS — E034 Atrophy of thyroid (acquired): Secondary | ICD-10-CM

## 2017-07-04 DIAGNOSIS — Z01810 Encounter for preprocedural cardiovascular examination: Secondary | ICD-10-CM

## 2017-07-04 DIAGNOSIS — I2510 Atherosclerotic heart disease of native coronary artery without angina pectoris: Secondary | ICD-10-CM

## 2017-07-04 MED ORDER — ATORVASTATIN CALCIUM 20 MG PO TABS: 20.0000 mg | ORAL_TABLET | Freq: Every day | ORAL | 3 refills | Status: AC

## 2017-07-04 NOTE — Patient Instructions (Addendum)
Atorvastatin 20 mg daily  Cardiology evaluation as planned  Cardiology follow-up as scheduled  Return in 3-6 months for follow-up
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
## 2017-07-04 NOTE — Patient Instructions (Addendum)
We will be checking the following labs today - NONE   Medication Instructions:    Continue with your current medicines.     Testing/Procedures To Be Arranged:  Echocardiogram  Lexiscan myoview  Follow-Up:   See Dr. Tamala Julian in September    Other Special Instructions:   I will send a note to Dr. Mannie Stabile are scheduled for a Myocardial Perfusion Imaging Study on ____________ at ________________.   Please arrive 15 minutes prior to your appointment time for registration and insurance purposes.   The test will take approximately 3 to 4 hours to complete; you may bring reading material. If someone comes with you to your appointment, they will need to remain in the main lobby due to limited space in the testing area.   If you are pregnant or breastfeeding, please notify the nuclear lab prior to your appointment.   How to prepare for your Myocardial Perfusion test:   Do not eat or drink 3 hours prior to your test, except you may have water.    Do not consume products containing caffeine (regular or decaffeinated) 12 hours prior to your test (ex: coffee, chocolate, soda, tea)   Do bring a list of your current medications with you. If not listed below, you may take your medications as normal.     Bring any held medication to your appointment, as you may be required to take it once the test is complete.   Do wear comfortable clothes (no dresses or overalls) and walking shoes. Tennis shoes are preferred. No heels or open toed shoes.  Do not wear cologne, perfume, aftershave or lotions (deodorant is allowed).   If these instructions are not followed, you test will have to be rescheduled.   Please report to [address] for your test. If you have questions or concerns about your appointment, please call the Nuclear Lab at [phone].  If you cannot keep your appointment, please provide 24 hour notification to the Nuclear lab to avoid a possible $50 charge  to your account.        If you need a refill on your cardiac medications before your next appointment, please call your pharmacy.   Call the [place] office at [phone] if you have any questions, problems or concerns.

## 2017-07-18 ENCOUNTER — Encounter (HOSPITAL_COMMUNITY): Payer: Medicare Other

## 2017-07-18 ENCOUNTER — Other Ambulatory Visit (HOSPITAL_COMMUNITY): Payer: Medicare Other

## 2017-07-25 ENCOUNTER — Ambulatory Visit (INDEPENDENT_AMBULATORY_CARE_PROVIDER_SITE_OTHER): Payer: Medicare Other | Admitting: *Deleted

## 2017-07-25 DIAGNOSIS — Z952 Presence of prosthetic heart valve: Secondary | ICD-10-CM | POA: Diagnosis not present

## 2017-07-25 LAB — POCT INR: INR: 3 (ref 2.0–3.0)

## 2017-07-25 NOTE — Patient Instructions (Signed)
Description   Continue taking 1/2 tablet every day except 1 tablet on Sundays, Tuesdays, and Thursdays. Recheck INR in 6 weeks. Call with new or different meds [phone] or if scheduled for any procedures

## 2017-07-26 ENCOUNTER — Telehealth: Payer: Self-pay | Admitting: Internal Medicine

## 2017-07-26 NOTE — Telephone Encounter (Signed)
Rx needs a Dx code. Called pharmacy will try again tomorrow.

## 2017-07-26 NOTE — Telephone Encounter (Signed)
Copied from [place] [id number]. Topic: Quick Communication - Rx Refill/Question >> Jul 26, 2017  1:17 PM Paula Ferguson, Paula Ferguson wrote: Medication: HYDROcodone-acetaminophen ([place]) 7.5-325 MG tablet   Has the patient contacted their pharmacy? Yes.   (Agent: If no, request that the patient contact the pharmacy for the refill.) (Agent: If yes, when and what did the pharmacy advise?)  Preferred Pharmacy (with phone number or street name): [facility], [place] [address] Phone: [phone] Fax: [phone]  Agent: Please be advised that RX refills may take up to 3 business days. We ask that you follow-up with your pharmacy.   Pharmacy needs a callback from Dr. Raliegh Ip to get a diagnosis code in order to fill the script.

## 2017-07-27 NOTE — Telephone Encounter (Signed)
Dx code given. No further action needed.

## 2017-08-06 ENCOUNTER — Telehealth (HOSPITAL_COMMUNITY): Payer: Self-pay | Admitting: *Deleted

## 2017-08-06 NOTE — Telephone Encounter (Signed)
Patient given detailed instructions per Myocardial Perfusion Study Information Sheet for the test on 08/08/17 Patient notified to arrive 15 minutes early and that it is imperative to arrive on time for appointment to keep from having the test rescheduled.  If you need to cancel or reschedule your appointment, please call the office within 24 hours of your appointment. . Patient verbalized understanding. Kirstie Peri

## 2017-08-08 ENCOUNTER — Ambulatory Visit (HOSPITAL_COMMUNITY): Payer: Medicare Other | Attending: Cardiology

## 2017-08-08 ENCOUNTER — Other Ambulatory Visit: Payer: Self-pay

## 2017-08-08 ENCOUNTER — Ambulatory Visit (HOSPITAL_BASED_OUTPATIENT_CLINIC_OR_DEPARTMENT_OTHER): Payer: Medicare Other

## 2017-08-08 DIAGNOSIS — I119 Hypertensive heart disease without heart failure: Secondary | ICD-10-CM | POA: Diagnosis not present

## 2017-08-08 DIAGNOSIS — Z01810 Encounter for preprocedural cardiovascular examination: Secondary | ICD-10-CM

## 2017-08-08 DIAGNOSIS — E785 Hyperlipidemia, unspecified: Secondary | ICD-10-CM | POA: Diagnosis not present

## 2017-08-08 DIAGNOSIS — I081 Rheumatic disorders of both mitral and tricuspid valves: Secondary | ICD-10-CM | POA: Insufficient documentation

## 2017-08-08 DIAGNOSIS — I2510 Atherosclerotic heart disease of native coronary artery without angina pectoris: Secondary | ICD-10-CM | POA: Diagnosis not present

## 2017-08-08 DIAGNOSIS — R011 Cardiac murmur, unspecified: Secondary | ICD-10-CM | POA: Diagnosis not present

## 2017-08-08 LAB — ECHOCARDIOGRAM COMPLETE
Height: 63 in
Weight: 1936 oz

## 2017-08-08 LAB — MYOCARDIAL PERFUSION IMAGING
LV dias vol: 65 mL (ref 46–106)
LV sys vol: 18 mL
Peak HR: 105 {beats}/min
RATE: 0.29
Rest HR: 85 {beats}/min
SDS: 4
SRS: 6
SSS: 10
TID: 0.93

## 2017-08-08 MED ORDER — TECHNETIUM TC 99M TETROFOSMIN IV KIT
31.3000 | PACK | Freq: Once | INTRAVENOUS | Status: AC | PRN
  Administered 2017-08-08: 31.3 via INTRAVENOUS
  Filled 2017-08-08: qty 32

## 2017-08-08 MED ORDER — TECHNETIUM TC 99M TETROFOSMIN IV KIT
10.1000 | PACK | Freq: Once | INTRAVENOUS | Status: AC | PRN
  Administered 2017-08-08: 10.1 via INTRAVENOUS
  Filled 2017-08-08: qty 11

## 2017-08-08 MED ORDER — REGADENOSON 0.4 MG/5ML IV SOLN
0.4000 mg | Freq: Once | INTRAVENOUS | Status: AC
  Administered 2017-08-08: 0.4 mg via INTRAVENOUS

## 2017-08-16 DIAGNOSIS — M25561 Pain in right knee: Secondary | ICD-10-CM | POA: Diagnosis not present

## 2017-08-29 ENCOUNTER — Ambulatory Visit (INDEPENDENT_AMBULATORY_CARE_PROVIDER_SITE_OTHER): Payer: Medicare Other | Admitting: *Deleted

## 2017-08-29 DIAGNOSIS — Z952 Presence of prosthetic heart valve: Secondary | ICD-10-CM | POA: Diagnosis not present

## 2017-08-29 DIAGNOSIS — Z5181 Encounter for therapeutic drug level monitoring: Secondary | ICD-10-CM | POA: Diagnosis not present

## 2017-08-29 LAB — PROTIME-INR
INR: 8.8 — AB (ref 0.8–1.2)
Prothrombin Time: 82.3 s — ABNORMAL HIGH (ref 9.1–12.0)

## 2017-08-29 LAB — POCT INR: INR: 6.8 — AB (ref 2.0–3.0)

## 2017-08-29 NOTE — Patient Instructions (Signed)
Description   Has not taken coumadin today do not take any coumadin until called Stat Venipuncture Monitor for any bleeding and go to ER if seen Do good serving of greens when gets home INR returned  at 8.8  Spoke with pt's daughter Hassan Rowan and instructed no coumadin on July 10th no coumadin on July 11th no coumadin on July 12th no coumadin on July 13th no coumadin on July 14th then recheck INR on July 15  Continue to monitor for any bleeding and go to ER if seen Do good serving of greens each day until rechecked Call with new or different meds [phone] or if scheduled for any procedures

## 2017-08-30 ENCOUNTER — Other Ambulatory Visit: Payer: Self-pay | Admitting: Internal Medicine

## 2017-08-30 NOTE — Telephone Encounter (Signed)
LOV 07/04/17 Dr. Burnice Logan Last refill 03/01/17  # 120  With 0 refill

## 2017-08-30 NOTE — Telephone Encounter (Signed)
Copied from [place] [id number]. Topic: Quick Communication - Rx Refill/Question >> Aug 30, 2017  2:18 PM Waldemar Dickens, Sade R wrote: Medication: HYDROcodone-acetaminophen ([place]) 7.5-325 MG tablet  Has the patient contacted their pharmacy?No   Preferred Pharmacy (with phone number or street name): [place], [place] [phone] (Phone) [phone] (Fax)      Agent: Please be advised that RX refills may take up to 3 business days. We ask that you follow-up with your pharmacy.

## 2017-09-03 ENCOUNTER — Ambulatory Visit (INDEPENDENT_AMBULATORY_CARE_PROVIDER_SITE_OTHER): Payer: Medicare Other | Admitting: *Deleted

## 2017-09-03 DIAGNOSIS — Z5181 Encounter for therapeutic drug level monitoring: Secondary | ICD-10-CM | POA: Insufficient documentation

## 2017-09-03 DIAGNOSIS — Z952 Presence of prosthetic heart valve: Secondary | ICD-10-CM

## 2017-09-03 LAB — POCT INR: INR: 1.5 — AB (ref 2.0–3.0)

## 2017-09-03 NOTE — Patient Instructions (Signed)
Description   Today take 1 tablet then continue taking 1/2 tablet except 1 tablet on Sunday, Tuesday, and Thursday.  Recheck INR in 1 week.  Call with new or different meds [phone] or if scheduled for any procedures

## 2017-09-05 ENCOUNTER — Ambulatory Visit (INDEPENDENT_AMBULATORY_CARE_PROVIDER_SITE_OTHER): Payer: Medicare Other | Admitting: Internal Medicine

## 2017-09-05 ENCOUNTER — Encounter: Payer: Self-pay | Admitting: Internal Medicine

## 2017-09-05 VITALS — BP 130/70 | HR 85 | Temp 98.8°F

## 2017-09-05 DIAGNOSIS — M159 Polyosteoarthritis, unspecified: Secondary | ICD-10-CM

## 2017-09-05 DIAGNOSIS — I2510 Atherosclerotic heart disease of native coronary artery without angina pectoris: Secondary | ICD-10-CM

## 2017-09-05 DIAGNOSIS — M150 Primary generalized (osteo)arthritis: Secondary | ICD-10-CM

## 2017-09-05 DIAGNOSIS — G8929 Other chronic pain: Secondary | ICD-10-CM

## 2017-09-05 DIAGNOSIS — G894 Chronic pain syndrome: Secondary | ICD-10-CM

## 2017-09-05 MED ORDER — HYDROCODONE-ACETAMINOPHEN 7.5-325 MG PO TABS: 1.0000 | ORAL_TABLET | Freq: Four times a day (QID) | ORAL | 0 refills | Status: DC | PRN

## 2017-09-05 MED ORDER — METHOCARBAMOL 500 MG PO TABS: 500.0000 mg | ORAL_TABLET | Freq: Three times a day (TID) | ORAL | 3 refills | Status: AC | PRN

## 2017-09-05 NOTE — Patient Instructions (Signed)
Limit your sodium (Salt) intake  Please check your blood pressure on a regular basis.  If it is consistently greater than 150/90, please make an office appointment.  Return in 3 months for follow-up  

## 2017-09-05 NOTE — Progress Notes (Signed)
   Subjective:    Patient ID: PAYZLEE RYDER, female    DOB: 11-15-1940, 77 y.o.   MRN: [id number]  HPI 77 year old patient who is seen today for pain management evaluation per opioid protocol  [place] printed, initialed  and scanned into the EMR.  Indication for chronic opioid: She has a chronic pain syndrome as well as generalized osteoarthritis.  She was scheduled for elective left total knee replacement surgery but this has been postponed.  She has generalized pain most marked in weightbearing joints especially the left knee  Medication and dose: Hydrocodone 7.5 w she is status post multiple orthopedic procedures hich she states that she takes 3 times daily. # pills per month: Limited to 120/month Last UDS date: March 01, 2017 Opioid Treatment Agreement signed (Y/N): Yes Opioid Treatment Agreement last reviewed with patient:  November 28, 2016 [place] reviewed this encounter (include red flags):  Yes   Review of Systems     Objective:   Physical Exam        Assessment & Plan:   Encounter for chronic pain management (G89.29) Narcotic use  (711.90)   Marletta Lor

## 2017-09-07 ENCOUNTER — Telehealth: Payer: Self-pay | Admitting: *Deleted

## 2017-09-07 NOTE — Telephone Encounter (Signed)
PA for methocarbamol 500 mg initiated via CoverMyMeds. Awaiting determination. Key: [id number]

## 2017-09-10 NOTE — Telephone Encounter (Signed)
PA approved from 09/07/2017 through 09/08/2018.

## 2017-09-12 ENCOUNTER — Ambulatory Visit (INDEPENDENT_AMBULATORY_CARE_PROVIDER_SITE_OTHER): Payer: Medicare Other | Admitting: Pharmacist

## 2017-09-12 DIAGNOSIS — Z5181 Encounter for therapeutic drug level monitoring: Secondary | ICD-10-CM

## 2017-09-12 DIAGNOSIS — Z952 Presence of prosthetic heart valve: Secondary | ICD-10-CM | POA: Diagnosis not present

## 2017-09-12 LAB — POCT INR: INR: 2.3 (ref 2.0–3.0)

## 2017-09-12 NOTE — Patient Instructions (Signed)
Description   Continue taking 1/2 tablet except 1 tablet on Sunday, Tuesday, and Thursday.  Only eat normal dark green leafy veggies no extras. Recheck INR in 3 weeks. Call with new or different meds [phone] or if scheduled for any procedures

## 2017-09-13 ENCOUNTER — Inpatient Hospital Stay: Admit: 2017-09-13 | Payer: Medicare Other | Admitting: Orthopedic Surgery

## 2017-09-13 SURGERY — CONVERSION, ARTHROPLASTY, KNEE, PARTIAL, TO TOTAL KNEE ARTHROPLASTY
Anesthesia: Spinal | Site: Knee | Laterality: Left

## 2017-09-18 ENCOUNTER — Encounter: Payer: Self-pay | Admitting: Internal Medicine

## 2017-09-18 ENCOUNTER — Ambulatory Visit (INDEPENDENT_AMBULATORY_CARE_PROVIDER_SITE_OTHER): Payer: Medicare Other | Admitting: Internal Medicine

## 2017-09-18 VITALS — BP 140/76 | HR 76 | Temp 98.4°F

## 2017-09-18 DIAGNOSIS — E039 Hypothyroidism, unspecified: Secondary | ICD-10-CM | POA: Diagnosis not present

## 2017-09-18 DIAGNOSIS — I2510 Atherosclerotic heart disease of native coronary artery without angina pectoris: Secondary | ICD-10-CM

## 2017-09-18 DIAGNOSIS — M150 Primary generalized (osteo)arthritis: Secondary | ICD-10-CM | POA: Diagnosis not present

## 2017-09-18 DIAGNOSIS — R5383 Other fatigue: Secondary | ICD-10-CM | POA: Diagnosis not present

## 2017-09-18 DIAGNOSIS — G8929 Other chronic pain: Secondary | ICD-10-CM

## 2017-09-18 DIAGNOSIS — I10 Essential (primary) hypertension: Secondary | ICD-10-CM | POA: Diagnosis not present

## 2017-09-18 DIAGNOSIS — M159 Polyosteoarthritis, unspecified: Secondary | ICD-10-CM

## 2017-09-18 NOTE — Patient Instructions (Signed)
Orthopedic follow-up as scheduled  Return in 3 months for follow-up

## 2017-09-18 NOTE — Progress Notes (Signed)
Subjective:    Patient ID: Paula Ferguson, female    DOB: [birth year], 77 y.o.   MRN: [id number]  HPI  77 year old patient who is followed with essential hypertension.  She has a history of advanced arthritis affecting multiple joints.  She is status post prior right total knee replacement surgery and had been scheduled for elective left total knee replacement. She now is having bilateral knee pain and is reluctant to undergo surgery on the left when she is having so much pain involving the right knee She is on chronic narcotic analgesics.  She complains of chronic fatigue and also some distal lower leg pain  Past Medical History:  Diagnosis Date  . ANEMIA 09/15/2009  . Blood transfusion 2000's; 11/17/2011   S/P "one of my other shoulder surgeries"; S/P left shoulder replacement  . CORONARY ARTERY DISEASE 08/26/2007   LOV Dr Tamala Julian 1/13 with EKG and clearance on chart  . Depression   . Heart murmur    hx- surgery  . HYPERLIPIDEMIA 02/07/2007  . HYPERTENSION 09/14/2006  . HYPOTHYROIDISM 09/14/2006  . LIVER FUNCTION TESTS, ABNORMAL, HX OF 01/12/2009  . OLECRANON BURSITIS, LEFT 08/26/2007  . OSTEOARTHRITIS 02/07/2007   "thumbs; knees; shoulders; ?back"  . Pneumonia    hx  . PTSD (post-traumatic stress disorder) 1976     Social History   Socioeconomic History  . Marital status: Widowed    Spouse name: Not on file  . Number of children: Not on file  . Years of education: Not on file  . Highest education level: Not on file  Occupational History  . Not on file  Social Needs  . Financial resource strain: Not on file  . Food insecurity:    Worry: Not on file    Inability: Not on file  . Transportation needs:    Medical: Not on file    Non-medical: Not on file  Tobacco Use  . Smoking status: Never Smoker  . Smokeless tobacco: Never Used  Substance and Sexual Activity  . Alcohol use: No  . Drug use: No  . Sexual activity: Not Currently  Lifestyle  . Physical activity:   Days per week: Not on file    Minutes per session: Not on file  . Stress: Not on file  Relationships  . Social connections:    Talks on phone: Not on file    Gets together: Not on file    Attends religious service: Not on file    Active member of club or organization: Not on file    Attends meetings of clubs or organizations: Not on file    Relationship status: Not on file  . Intimate partner violence:    Fear of current or ex partner: Not on file    Emotionally abused: Not on file    Physically abused: Not on file    Forced sexual activity: Not on file  Other Topics Concern  . Not on file  Social History Narrative  . Not on file    Past Surgical History:  Procedure Laterality Date  . ABDOMINAL HYSTERECTOMY  1981  . APPENDECTOMY  1981  . CARDIAC CATHETERIZATION  2003  . CARDIAC VALVE REPLACEMENT  2003   "aortic"  . CARPAL TUNNEL RELEASE  1990's   bilaterally  . CHOLECYSTECTOMY  1990's  . COLONOSCOPY    . CORONARY ARTERY BYPASS GRAFT  2003   CABG X1 "mammary" w/aortic valve replacement   . DILATION AND CURETTAGE OF UTERUS  1981?  . EXPLORATORY  LAPAROTOMY  ~ 1981  . FRACTURE SURGERY    . JOINT REPLACEMENT     right shoulder arthroscopy, left partial knee replacement  . KNEE ARTHROSCOPY  1980's; 2012   left; right  . PARTIAL KNEE ARTHROPLASTY  1990's   left  . REVERSE SHOULDER ARTHROPLASTY  11/17/2011   Procedure: REVERSE SHOULDER ARTHROPLASTY;  Surgeon: Augustin Schooling, MD;  Location: [facility];  Service: Orthopedics;  Laterality: Left;  left reverse shoulder arthroplasty  . SHOULDER ARTHROSCOPY  2000's   right; "3 surgeries";  Marland Kitchen SHOULDER ARTHROSCOPY W/ ROTATOR CUFF REPAIR  1990's  . STERIOD INJECTION  11/17/2011   Procedure: STEROID INJECTION;  Surgeon: Augustin Schooling, MD;  Location: [facility];  Service: Orthopedics;  Laterality: Right;  cortizone injection right thumb  . TONSILLECTOMY AND ADENOIDECTOMY  ~ 1964  . TOTAL KNEE ARTHROPLASTY  03/21/2011   Procedure: TOTAL KNEE  ARTHROPLASTY;  Surgeon: Mauri Pole, MD;  Location: [facility];  Service: Orthopedics;  Laterality: Right;  . TOTAL SHOULDER REPLACEMENT  2012; 11/17/2011   right; left  . TRANSFER / TRANSPLANT ANKLE TENDON SUPERFICIAL / DEEP  1990's   "bunch of surgeries after dr ruptured posterior tibial tendon", right foot  . v43.3     heart valve    Family History  Problem Relation Age of Onset  . Lung disease Mother   . Lung disease Father   . Healthy Sister   . Kidney disease Brother     Allergies  Allergen Reactions  . Hydromorphone Hcl Itching    "Dilaudid"  . Meperidine Hcl Itching  . Morphine Sulfate Itching    Current Outpatient Medications on File Prior to Visit  Medication Sig Dispense Refill  . amoxicillin (AMOXIL) 500 MG capsule TAKE 4 TABLETS BY MOUTH 1/2 HOUR PRIOR TO DENTAL APPOINTMENT. 8 capsule 0  . atorvastatin (LIPITOR) 20 MG tablet Take 1 tablet (20 mg total) by mouth daily. 90 tablet 3  . BIOTIN PO Take 1 tablet by mouth daily.    Marland Kitchen buPROPion (WELLBUTRIN SR) 150 MG 12 hr tablet TAKE 1 TABLET BY MOUTH DAILY. 90 tablet 4  . Calcium Citrate-Vitamin D (CITRACAL PETITES/VITAMIN D PO) Take 1 tablet by mouth daily.     . cholecalciferol (VITAMIN D) 1000 UNITS tablet Take 1,000 Units by mouth daily.    . Cyanocobalamin (VITAMIN B-12 PO) Take 1 tablet by mouth daily.    Marland Kitchen HYDROcodone-acetaminophen (NORCO) 7.5-325 MG tablet Take 1 tablet by mouth every 6 (six) hours as needed for moderate pain. 120 tablet 0  . levothyroxine (SYNTHROID, LEVOTHROID) 100 MCG tablet TAKE 1 TABLET BY MOUTH DAILY. 90 tablet 2  . losartan (COZAAR) 100 MG tablet TAKE 1 TABLET BY MOUTH DAILY. 90 tablet 5  . methocarbamol (ROBAXIN) 500 MG tablet Take 1 tablet (500 mg total) by mouth 3 (three) times daily as needed. 60 tablet 3  . Multiple Vitamins-Minerals (CENTRUM PO) Take 1 tablet by mouth daily.    . traZODone (DESYREL) 100 MG tablet TAKE 1 TABLET BY MOUTH AT BEDTIME AS NEEDED FOR SLEEP. 90 tablet 2  .  verapamil (CALAN-SR) 240 MG CR tablet TAKE 1/2 TABLET BY MOUTH AT BEDTIME. 90 tablet 1  . warfarin (COUMADIN) 5 MG tablet TAKE 1 TABLET BY MOUTH AS DIRECTED. 90 tablet 2   No current facility-administered medications on file prior to visit.     BP 140/76 (BP Location: Right Arm, Patient Position: Sitting, Cuff Size: Normal)   Pulse 76   Temp 98.4 F (  36.9 C) (Oral)   SpO2 98%     Review of Systems  Constitutional: Positive for fatigue.  HENT: Negative for congestion, dental problem, hearing loss, rhinorrhea, sinus pressure, sore throat and tinnitus.   Eyes: Negative for pain, discharge and visual disturbance.  Respiratory: Negative for cough and shortness of breath.   Cardiovascular: Positive for leg swelling. Negative for chest pain and palpitations.  Gastrointestinal: Negative for abdominal distention, abdominal pain, blood in stool, constipation, diarrhea, nausea and vomiting.  Genitourinary: Negative for difficulty urinating, dysuria, flank pain, frequency, hematuria, pelvic pain, urgency, vaginal bleeding, vaginal discharge and vaginal pain.  Musculoskeletal: Positive for arthralgias and gait problem. Negative for joint swelling.  Skin: Negative for rash.  Neurological: Positive for weakness. Negative for dizziness, syncope, speech difficulty, numbness and headaches.  Hematological: Negative for adenopathy.  Psychiatric/Behavioral: Negative for agitation, behavioral problems and dysphoric mood. The patient is not nervous/anxious.        Objective:   Physical Exam  Constitutional: She appears well-nourished. No distress.  Walks with a cane Blood pressure well controlled  Musculoskeletal: She exhibits edema.  Marked generalized osteoarthritic joint changes Walks with a cane Trace pedal edema          Assessment & Plan:   Advanced diffuse osteoarthritis.  Orthopedic follow-up Patient is on chronic hydrocodone.  Hypertension stable Hypothyroidism.  Will review  TSH Increasing fatigue.  Will review CBC  Follow-up in 3 months  Marletta Lor

## 2017-09-19 ENCOUNTER — Other Ambulatory Visit (INDEPENDENT_AMBULATORY_CARE_PROVIDER_SITE_OTHER): Payer: Medicare Other

## 2017-09-19 DIAGNOSIS — D519 Vitamin B12 deficiency anemia, unspecified: Secondary | ICD-10-CM

## 2017-09-19 LAB — CBC WITH DIFFERENTIAL/PLATELET
BASOS PCT: 1.2 % (ref 0.0–3.0)
Basophils Absolute: 0 10*3/uL (ref 0.0–0.1)
EOS PCT: 4.7 % (ref 0.0–5.0)
Eosinophils Absolute: 0.1 10*3/uL (ref 0.0–0.7)
HCT: 34.9 % — ABNORMAL LOW (ref 36.0–46.0)
HEMOGLOBIN: 11.8 g/dL — AB (ref 12.0–15.0)
Lymphocytes Relative: 19.2 % (ref 12.0–46.0)
Lymphs Abs: 0.6 10*3/uL — ABNORMAL LOW (ref 0.7–4.0)
MCHC: 33.9 g/dL (ref 30.0–36.0)
MCV: 96.5 fl (ref 78.0–100.0)
Monocytes Absolute: 0.4 10*3/uL (ref 0.1–1.0)
Monocytes Relative: 12.6 % — ABNORMAL HIGH (ref 3.0–12.0)
NEUTROS ABS: 1.8 10*3/uL (ref 1.4–7.7)
NEUTROS PCT: 62.3 % (ref 43.0–77.0)
Platelets: 158 10*3/uL (ref 150.0–400.0)
RBC: 3.62 Mil/uL — ABNORMAL LOW (ref 3.87–5.11)
RDW: 17.2 % — AB (ref 11.5–15.5)
WBC: 2.9 10*3/uL — AB (ref 4.0–10.5)

## 2017-09-19 LAB — TSH: TSH: 2.8 u[IU]/mL (ref 0.35–4.50)

## 2017-09-19 LAB — VITAMIN B12: Vitamin B-12: 704 pg/mL (ref 211–911)

## 2017-09-24 ENCOUNTER — Telehealth: Payer: Self-pay | Admitting: Internal Medicine

## 2017-09-24 NOTE — Telephone Encounter (Signed)
Copied from [facility] [id number]. Topic: Quick Communication - See Telephone Encounter >> Sep 24, 2017  4:21 PM Mylinda Latina, NT wrote: CRM for notification. See Telephone encounter for: 09/24/17. Patient called and states she has a few questions about her lab results.  She was told she is anemic and she had questions [phone]

## 2017-09-27 NOTE — Telephone Encounter (Signed)
Spoke to pt and she is concerned if she needs to take anything. Pt was advise that Dr.Kwiatkowski wants her to follow-up in 2 months to recheck maybe before starting any Rx. Pt verbalized understanding and wants to know if she should come in sooner before he leaves to have a clue on what to do.

## 2017-10-01 NOTE — Telephone Encounter (Signed)
Pt scheduled. No further action needed.

## 2017-10-01 NOTE — Telephone Encounter (Signed)
Okay to reschedule follow-up in 4 weeks

## 2017-10-01 NOTE — Telephone Encounter (Signed)
Please schedule follow-up visit this week or next

## 2017-10-01 NOTE — Telephone Encounter (Signed)
Pt was told to come in about 4 weeks from now. Pt stated that if she cannot be seen sooner then she wants to see another doctor bc she is afraid that she could "Die".  Please advise

## 2017-10-03 ENCOUNTER — Ambulatory Visit (INDEPENDENT_AMBULATORY_CARE_PROVIDER_SITE_OTHER): Payer: Medicare Other | Admitting: Internal Medicine

## 2017-10-03 ENCOUNTER — Encounter: Payer: Self-pay | Admitting: Internal Medicine

## 2017-10-03 VITALS — BP 140/70 | HR 77 | Temp 98.7°F

## 2017-10-03 DIAGNOSIS — D708 Other neutropenia: Secondary | ICD-10-CM | POA: Diagnosis not present

## 2017-10-03 DIAGNOSIS — I2510 Atherosclerotic heart disease of native coronary artery without angina pectoris: Secondary | ICD-10-CM

## 2017-10-03 DIAGNOSIS — Z952 Presence of prosthetic heart valve: Secondary | ICD-10-CM | POA: Diagnosis not present

## 2017-10-03 DIAGNOSIS — D709 Neutropenia, unspecified: Secondary | ICD-10-CM | POA: Insufficient documentation

## 2017-10-03 NOTE — Patient Instructions (Signed)
Abdominal ultrasound as discussed  Hematology consultation as discussed  Return in 3 months for follow-up

## 2017-10-03 NOTE — Progress Notes (Signed)
Subjective:    Patient ID: Paula Ferguson, female    DOB: Oct 11, 1940, 77 y.o.   MRN: [id number]  HPI  77 year old patient who is status post aortic valve repair and remains on chronic anticoagulation since 2003. She was seen recently and noted to have some mild anemia and the patient is seen here today to discuss findings and further evaluation. She has also noted a progressive neutropenia and intermittent thrombocytopenia.  Vitamin B12 level normal. She did have a negative Cologuard last year and a colonoscopy performed by Dr. Collene Mares 3 to 5 years ago.  No history of alcohol use or liver disease  Past Medical History:  Diagnosis Date  . ANEMIA 09/15/2009  . Blood transfusion 2000's; 11/17/2011   S/P "one of my other shoulder surgeries"; S/P left shoulder replacement  . CORONARY ARTERY DISEASE 08/26/2007   LOV Dr Tamala Julian 1/13 with EKG and clearance on chart  . Depression   . Heart murmur    hx- surgery  . HYPERLIPIDEMIA 02/07/2007  . HYPERTENSION 09/14/2006  . HYPOTHYROIDISM 09/14/2006  . LIVER FUNCTION TESTS, ABNORMAL, HX OF 01/12/2009  . OLECRANON BURSITIS, LEFT 08/26/2007  . OSTEOARTHRITIS 02/07/2007   "thumbs; knees; shoulders; ?back"  . Pneumonia    hx  . PTSD (post-traumatic stress disorder) 1976     Social History   Socioeconomic History  . Marital status: Widowed    Spouse name: Not on file  . Number of children: Not on file  . Years of education: Not on file  . Highest education level: Not on file  Occupational History  . Not on file  Social Needs  . Financial resource strain: Not on file  . Food insecurity:    Worry: Not on file    Inability: Not on file  . Transportation needs:    Medical: Not on file    Non-medical: Not on file  Tobacco Use  . Smoking status: Never Smoker  . Smokeless tobacco: Never Used  Substance and Sexual Activity  . Alcohol use: No  . Drug use: No  . Sexual activity: Not Currently  Lifestyle  . Physical activity:    Days per week:  Not on file    Minutes per session: Not on file  . Stress: Not on file  Relationships  . Social connections:    Talks on phone: Not on file    Gets together: Not on file    Attends religious service: Not on file    Active member of club or organization: Not on file    Attends meetings of clubs or organizations: Not on file    Relationship status: Not on file  . Intimate partner violence:    Fear of current or ex partner: Not on file    Emotionally abused: Not on file    Physically abused: Not on file    Forced sexual activity: Not on file  Other Topics Concern  . Not on file  Social History Narrative  . Not on file    Past Surgical History:  Procedure Laterality Date  . ABDOMINAL HYSTERECTOMY  1981  . APPENDECTOMY  1981  . CARDIAC CATHETERIZATION  2003  . CARDIAC VALVE REPLACEMENT  2003   "aortic"  . CARPAL TUNNEL RELEASE  1990's   bilaterally  . CHOLECYSTECTOMY  1990's  . COLONOSCOPY    . CORONARY ARTERY BYPASS GRAFT  2003   CABG X1 "mammary" w/aortic valve replacement   . DILATION AND CURETTAGE OF UTERUS  1981?  . EXPLORATORY  LAPAROTOMY  ~ 1981  . FRACTURE SURGERY    . JOINT REPLACEMENT     right shoulder arthroscopy, left partial knee replacement  . KNEE ARTHROSCOPY  1980's; 2012   left; right  . PARTIAL KNEE ARTHROPLASTY  1990's   left  . REVERSE SHOULDER ARTHROPLASTY  11/17/2011   Procedure: REVERSE SHOULDER ARTHROPLASTY;  Surgeon: Augustin Schooling, MD;  Location: [facility];  Service: Orthopedics;  Laterality: Left;  left reverse shoulder arthroplasty  . SHOULDER ARTHROSCOPY  2000's   right; "3 surgeries";  Marland Kitchen SHOULDER ARTHROSCOPY W/ ROTATOR CUFF REPAIR  1990's  . STERIOD INJECTION  11/17/2011   Procedure: STEROID INJECTION;  Surgeon: Augustin Schooling, MD;  Location: [facility];  Service: Orthopedics;  Laterality: Right;  cortizone injection right thumb  . TONSILLECTOMY AND ADENOIDECTOMY  ~ 1964  . TOTAL KNEE ARTHROPLASTY  03/21/2011   Procedure: TOTAL KNEE ARTHROPLASTY;   Surgeon: Mauri Pole, MD;  Location: [facility];  Service: Orthopedics;  Laterality: Right;  . TOTAL SHOULDER REPLACEMENT  2012; 11/17/2011   right; left  . TRANSFER / TRANSPLANT ANKLE TENDON SUPERFICIAL / DEEP  1990's   "bunch of surgeries after dr ruptured posterior tibial tendon", right foot  . v43.3     heart valve    Family History  Problem Relation Age of Onset  . Lung disease Mother   . Lung disease Father   . Healthy Sister   . Kidney disease Brother     Allergies  Allergen Reactions  . Hydromorphone Hcl Itching    "Dilaudid"  . Meperidine Hcl Itching  . Morphine Sulfate Itching    Current Outpatient Medications on File Prior to Visit  Medication Sig Dispense Refill  . amoxicillin (AMOXIL) 500 MG capsule TAKE 4 TABLETS BY MOUTH 1/2 HOUR PRIOR TO DENTAL APPOINTMENT. 8 capsule 0  . atorvastatin (LIPITOR) 20 MG tablet Take 1 tablet (20 mg total) by mouth daily. 90 tablet 3  . BIOTIN PO Take 1 tablet by mouth daily.    Marland Kitchen buPROPion (WELLBUTRIN SR) 150 MG 12 hr tablet TAKE 1 TABLET BY MOUTH DAILY. 90 tablet 4  . Calcium Citrate-Vitamin D (CITRACAL PETITES/VITAMIN D PO) Take 1 tablet by mouth daily.     . cholecalciferol (VITAMIN D) 1000 UNITS tablet Take 1,000 Units by mouth daily.    . Cyanocobalamin (VITAMIN B-12 PO) Take 1 tablet by mouth daily.    Marland Kitchen HYDROcodone-acetaminophen (NORCO) 7.5-325 MG tablet Take 1 tablet by mouth every 6 (six) hours as needed for moderate pain. 120 tablet 0  . levothyroxine (SYNTHROID, LEVOTHROID) 100 MCG tablet TAKE 1 TABLET BY MOUTH DAILY. 90 tablet 2  . losartan (COZAAR) 100 MG tablet TAKE 1 TABLET BY MOUTH DAILY. 90 tablet 5  . methocarbamol (ROBAXIN) 500 MG tablet Take 1 tablet (500 mg total) by mouth 3 (three) times daily as needed. 60 tablet 3  . Multiple Vitamins-Minerals (CENTRUM PO) Take 1 tablet by mouth daily.    . traZODone (DESYREL) 100 MG tablet TAKE 1 TABLET BY MOUTH AT BEDTIME AS NEEDED FOR SLEEP. 90 tablet 2  . verapamil  (CALAN-SR) 240 MG CR tablet TAKE 1/2 TABLET BY MOUTH AT BEDTIME. 90 tablet 1  . warfarin (COUMADIN) 5 MG tablet TAKE 1 TABLET BY MOUTH AS DIRECTED. 90 tablet 2   No current facility-administered medications on file prior to visit.     BP 140/70 (BP Location: Right Arm, Patient Position: Sitting, Cuff Size: Large)   Pulse 77   Temp 98.7 F (  37.1 C) (Oral)   SpO2 97%     Review of Systems  Constitutional: Negative.   HENT: Negative for congestion, dental problem, hearing loss, rhinorrhea, sinus pressure, sore throat and tinnitus.   Eyes: Negative for pain, discharge and visual disturbance.  Respiratory: Negative for cough and shortness of breath.   Cardiovascular: Negative for chest pain, palpitations and leg swelling.  Gastrointestinal: Negative for abdominal distention, abdominal pain, blood in stool, constipation, diarrhea, nausea and vomiting.  Genitourinary: Negative for difficulty urinating, dysuria, flank pain, frequency, hematuria, pelvic pain, urgency, vaginal bleeding, vaginal discharge and vaginal pain.  Musculoskeletal: Positive for arthralgias, back pain, gait problem and joint swelling.  Skin: Negative for rash.  Neurological: Negative for dizziness, syncope, speech difficulty, weakness, numbness and headaches.  Hematological: Negative for adenopathy.  Psychiatric/Behavioral: Negative for agitation, behavioral problems and dysphoric mood. The patient is not nervous/anxious.        Objective:   Physical Exam  Constitutional: She appears well-developed and well-nourished.  Abdominal: Soft.  No organomegaly  Musculoskeletal:  Soft tissue swelling right ankle          Assessment & Plan:   Mild anemia and neutropenia.  Will check an abdominal ultrasound to screen for hepatic disease or splenomegaly which seems unlikely. Hematology consultation  Marletta Lor

## 2017-10-05 ENCOUNTER — Encounter: Payer: Self-pay | Admitting: Oncology

## 2017-10-05 ENCOUNTER — Telehealth: Payer: Self-pay | Admitting: Oncology

## 2017-10-05 ENCOUNTER — Encounter: Payer: Self-pay | Admitting: Internal Medicine

## 2017-10-05 ENCOUNTER — Ambulatory Visit (INDEPENDENT_AMBULATORY_CARE_PROVIDER_SITE_OTHER): Payer: Medicare Other | Admitting: *Deleted

## 2017-10-05 DIAGNOSIS — Z952 Presence of prosthetic heart valve: Secondary | ICD-10-CM | POA: Diagnosis not present

## 2017-10-05 DIAGNOSIS — Z5181 Encounter for therapeutic drug level monitoring: Secondary | ICD-10-CM | POA: Diagnosis not present

## 2017-10-05 LAB — POCT INR: INR: 4.9 — AB (ref 2.0–3.0)

## 2017-10-05 NOTE — Telephone Encounter (Signed)
New referral received from Dr. Burnice Logan for neutropenia. I attempted to call and schedule the pt, but her vm asked for a access code and then hung up. I was unable to lv a messaged. I scheduled the pt to see Dr. Alen Blew on 9/5 at 2pm. I sent a message to Nonah Mattes, referral coordinator at [facility] to notify the pt. Letter mailed with the appt information.

## 2017-10-05 NOTE — Patient Instructions (Signed)
Description   Skip today and tomorrow's dose, then start taking 1/2 tablet except 1 tablet on Mondays and Fridays. Eat 2 servings each week of leafy green vegetable. Recheck INR in 10 days.  Call with new or different meds [phone] or if scheduled for any procedures

## 2017-10-09 ENCOUNTER — Encounter (HOSPITAL_COMMUNITY): Payer: Self-pay | Admitting: Radiology

## 2017-10-09 ENCOUNTER — Observation Stay (HOSPITAL_COMMUNITY): Payer: Medicare Other

## 2017-10-09 ENCOUNTER — Inpatient Hospital Stay (HOSPITAL_COMMUNITY)
Admission: EM | Admit: 2017-10-09 | Discharge: 2017-10-21 | DRG: 467 | Disposition: A | Discharge status: Skilled Nursing Facility | Payer: Medicare Other | Attending: Internal Medicine | Admitting: Internal Medicine

## 2017-10-09 ENCOUNTER — Emergency Department (HOSPITAL_COMMUNITY): Payer: Medicare Other

## 2017-10-09 DIAGNOSIS — R0781 Pleurodynia: Secondary | ICD-10-CM | POA: Diagnosis not present

## 2017-10-09 DIAGNOSIS — I2510 Atherosclerotic heart disease of native coronary artery without angina pectoris: Secondary | ICD-10-CM | POA: Diagnosis present

## 2017-10-09 DIAGNOSIS — W19XXXA Unspecified fall, initial encounter: Secondary | ICD-10-CM

## 2017-10-09 DIAGNOSIS — Y831 Surgical operation with implant of artificial internal device as the cause of abnormal reaction of the patient, or of later complication, without mention of misadventure at the time of the procedure: Secondary | ICD-10-CM | POA: Diagnosis present

## 2017-10-09 DIAGNOSIS — R42 Dizziness and giddiness: Secondary | ICD-10-CM | POA: Diagnosis not present

## 2017-10-09 DIAGNOSIS — E034 Atrophy of thyroid (acquired): Secondary | ICD-10-CM | POA: Diagnosis not present

## 2017-10-09 DIAGNOSIS — M25551 Pain in right hip: Secondary | ICD-10-CM | POA: Diagnosis not present

## 2017-10-09 DIAGNOSIS — M25569 Pain in unspecified knee: Secondary | ICD-10-CM | POA: Diagnosis present

## 2017-10-09 DIAGNOSIS — E7849 Other hyperlipidemia: Secondary | ICD-10-CM

## 2017-10-09 DIAGNOSIS — T8453XA Infection and inflammatory reaction due to internal right knee prosthesis, initial encounter: Secondary | ICD-10-CM | POA: Diagnosis not present

## 2017-10-09 DIAGNOSIS — R0600 Dyspnea, unspecified: Secondary | ICD-10-CM

## 2017-10-09 DIAGNOSIS — G8929 Other chronic pain: Secondary | ICD-10-CM | POA: Diagnosis not present

## 2017-10-09 DIAGNOSIS — R000 Tachycardia, unspecified: Secondary | ICD-10-CM | POA: Diagnosis not present

## 2017-10-09 DIAGNOSIS — D649 Anemia, unspecified: Secondary | ICD-10-CM | POA: Diagnosis present

## 2017-10-09 DIAGNOSIS — M6282 Rhabdomyolysis: Secondary | ICD-10-CM | POA: Diagnosis not present

## 2017-10-09 DIAGNOSIS — E8770 Fluid overload, unspecified: Secondary | ICD-10-CM | POA: Diagnosis not present

## 2017-10-09 DIAGNOSIS — R7881 Bacteremia: Secondary | ICD-10-CM | POA: Diagnosis not present

## 2017-10-09 DIAGNOSIS — Z96612 Presence of left artificial shoulder joint: Secondary | ICD-10-CM | POA: Diagnosis present

## 2017-10-09 DIAGNOSIS — R531 Weakness: Secondary | ICD-10-CM

## 2017-10-09 DIAGNOSIS — K5900 Constipation, unspecified: Secondary | ICD-10-CM | POA: Diagnosis present

## 2017-10-09 DIAGNOSIS — E785 Hyperlipidemia, unspecified: Secondary | ICD-10-CM | POA: Diagnosis present

## 2017-10-09 DIAGNOSIS — E46 Unspecified protein-calorie malnutrition: Secondary | ICD-10-CM | POA: Diagnosis present

## 2017-10-09 DIAGNOSIS — R29898 Other symptoms and signs involving the musculoskeletal system: Secondary | ICD-10-CM | POA: Diagnosis not present

## 2017-10-09 DIAGNOSIS — S8991XA Unspecified injury of right lower leg, initial encounter: Secondary | ICD-10-CM | POA: Diagnosis not present

## 2017-10-09 DIAGNOSIS — R791 Abnormal coagulation profile: Secondary | ICD-10-CM | POA: Diagnosis not present

## 2017-10-09 DIAGNOSIS — S79911A Unspecified injury of right hip, initial encounter: Secondary | ICD-10-CM | POA: Diagnosis not present

## 2017-10-09 DIAGNOSIS — F329 Major depressive disorder, single episode, unspecified: Secondary | ICD-10-CM | POA: Diagnosis present

## 2017-10-09 DIAGNOSIS — S299XXA Unspecified injury of thorax, initial encounter: Secondary | ICD-10-CM | POA: Diagnosis not present

## 2017-10-09 DIAGNOSIS — I70209 Unspecified atherosclerosis of native arteries of extremities, unspecified extremity: Secondary | ICD-10-CM | POA: Diagnosis present

## 2017-10-09 DIAGNOSIS — R188 Other ascites: Secondary | ICD-10-CM

## 2017-10-09 DIAGNOSIS — R52 Pain, unspecified: Secondary | ICD-10-CM

## 2017-10-09 DIAGNOSIS — K7460 Unspecified cirrhosis of liver: Secondary | ICD-10-CM

## 2017-10-09 DIAGNOSIS — D62 Acute posthemorrhagic anemia: Secondary | ICD-10-CM | POA: Diagnosis present

## 2017-10-09 DIAGNOSIS — Z7409 Other reduced mobility: Secondary | ICD-10-CM

## 2017-10-09 DIAGNOSIS — Z6828 Body mass index (BMI) 28.0-28.9, adult: Secondary | ICD-10-CM

## 2017-10-09 DIAGNOSIS — K838 Other specified diseases of biliary tract: Secondary | ICD-10-CM | POA: Diagnosis present

## 2017-10-09 DIAGNOSIS — R17 Unspecified jaundice: Secondary | ICD-10-CM

## 2017-10-09 DIAGNOSIS — M623 Immobility syndrome (paraplegic): Secondary | ICD-10-CM | POA: Diagnosis not present

## 2017-10-09 DIAGNOSIS — Z7901 Long term (current) use of anticoagulants: Secondary | ICD-10-CM

## 2017-10-09 DIAGNOSIS — E876 Hypokalemia: Secondary | ICD-10-CM | POA: Diagnosis not present

## 2017-10-09 DIAGNOSIS — R0602 Shortness of breath: Secondary | ICD-10-CM

## 2017-10-09 DIAGNOSIS — I10 Essential (primary) hypertension: Secondary | ICD-10-CM | POA: Diagnosis not present

## 2017-10-09 DIAGNOSIS — M25561 Pain in right knee: Secondary | ICD-10-CM | POA: Diagnosis not present

## 2017-10-09 DIAGNOSIS — E663 Overweight: Secondary | ICD-10-CM | POA: Diagnosis present

## 2017-10-09 DIAGNOSIS — D72829 Elevated white blood cell count, unspecified: Secondary | ICD-10-CM

## 2017-10-09 DIAGNOSIS — Z5181 Encounter for therapeutic drug level monitoring: Secondary | ICD-10-CM

## 2017-10-09 DIAGNOSIS — Z96651 Presence of right artificial knee joint: Secondary | ICD-10-CM | POA: Diagnosis not present

## 2017-10-09 DIAGNOSIS — Z9181 History of falling: Secondary | ICD-10-CM

## 2017-10-09 DIAGNOSIS — B9561 Methicillin susceptible Staphylococcus aureus infection as the cause of diseases classified elsewhere: Secondary | ICD-10-CM | POA: Clinically undetermined

## 2017-10-09 DIAGNOSIS — K8689 Other specified diseases of pancreas: Secondary | ICD-10-CM

## 2017-10-09 DIAGNOSIS — Z952 Presence of prosthetic heart valve: Secondary | ICD-10-CM

## 2017-10-09 DIAGNOSIS — E039 Hypothyroidism, unspecified: Secondary | ICD-10-CM | POA: Diagnosis present

## 2017-10-09 DIAGNOSIS — Z951 Presence of aortocoronary bypass graft: Secondary | ICD-10-CM

## 2017-10-09 DIAGNOSIS — Z79890 Hormone replacement therapy (postmenopausal): Secondary | ICD-10-CM

## 2017-10-09 DIAGNOSIS — Z96659 Presence of unspecified artificial knee joint: Secondary | ICD-10-CM

## 2017-10-09 LAB — BASIC METABOLIC PANEL
Anion gap: 10 (ref 5–15)
BUN: 21 mg/dL (ref 8–23)
CO2: 25 mmol/L (ref 22–32)
CREATININE: 0.79 mg/dL (ref 0.44–1.00)
Calcium: 9 mg/dL (ref 8.9–10.3)
Chloride: 105 mmol/L (ref 98–111)
GFR calc Af Amer: >60 mL/min (ref >60)
GLUCOSE: 92 mg/dL (ref 70–99)
Potassium: 3.8 mmol/L (ref 3.5–5.1)
SODIUM: 140 mmol/L (ref 135–145)

## 2017-10-09 LAB — TYPE AND SCREEN
ABO/RH(D): O POS
Antibody Screen: NEGATIVE

## 2017-10-09 LAB — CK TOTAL AND CKMB (NOT AT ARMC)
CK TOTAL: 1802 U/L — AB (ref 38–234)
CK, MB: 10.5 ng/mL — ABNORMAL HIGH (ref 0.5–5.0)
Relative Index: 0.6 (ref 0.0–2.5)

## 2017-10-09 LAB — CBC WITH DIFFERENTIAL/PLATELET
BASOS ABS: 0 10*3/uL (ref 0.0–0.1)
Basophils Relative: 0 %
EOS ABS: 0 10*3/uL (ref 0.0–0.7)
EOS PCT: 0 %
HCT: 34.2 % — ABNORMAL LOW (ref 36.0–46.0)
Hemoglobin: 11.4 g/dL — ABNORMAL LOW (ref 12.0–15.0)
LYMPHS ABS: 0.6 10*3/uL — AB (ref 0.7–4.0)
Lymphocytes Relative: 4 %
MCH: 32.1 pg (ref 26.0–34.0)
MCHC: 33.3 g/dL (ref 30.0–36.0)
MCV: 96.3 fL (ref 78.0–100.0)
MONO ABS: 1.6 10*3/uL — AB (ref 0.1–1.0)
Monocytes Relative: 11 %
Neutro Abs: 12 10*3/uL — ABNORMAL HIGH (ref 1.7–7.7)
Neutrophils Relative %: 85 %
PLATELETS: 153 10*3/uL (ref 150–400)
RBC: 3.55 MIL/uL — ABNORMAL LOW (ref 3.87–5.11)
RDW: 16.4 % — AB (ref 11.5–15.5)
WBC: 14.2 10*3/uL — AB (ref 4.0–10.5)

## 2017-10-09 LAB — CK: Total CK: 1793 U/L — ABNORMAL HIGH (ref 38–234)

## 2017-10-09 LAB — TSH: TSH: 2.573 u[IU]/mL (ref 0.350–4.500)

## 2017-10-09 LAB — MAGNESIUM: Magnesium: 1.5 mg/dL — ABNORMAL LOW (ref 1.7–2.4)

## 2017-10-09 LAB — PHOSPHORUS: PHOSPHORUS: 2.6 mg/dL (ref 2.5–4.6)

## 2017-10-09 LAB — PROTIME-INR
INR: 1.92
PROTHROMBIN TIME: 21.8 s — AB (ref 11.4–15.2)

## 2017-10-09 MED ORDER — WARFARIN SODIUM 2.5 MG PO TABS
2.5000 mg | ORAL_TABLET | Freq: Once | ORAL | Status: AC
  Administered 2017-10-09: 2.5 mg via ORAL
  Filled 2017-10-09: qty 1

## 2017-10-09 MED ORDER — ACETAMINOPHEN 325 MG PO TABS
650.0000 mg | ORAL_TABLET | Freq: Four times a day (QID) | ORAL | Status: DC | PRN
  Administered 2017-10-11 – 2017-10-20 (×3): 650 mg via ORAL
  Filled 2017-10-09 (×4): qty 2

## 2017-10-09 MED ORDER — FENTANYL CITRATE (PF) 100 MCG/2ML IJ SOLN: 25.0000 ug | INTRAMUSCULAR | Status: DC | PRN

## 2017-10-09 MED ORDER — SODIUM CHLORIDE 0.9 % IV SOLN
INTRAVENOUS | Status: DC
Start: 2017-10-09 — End: 2017-10-14
  Administered 2017-10-09 – 2017-10-14 (×7): via INTRAVENOUS

## 2017-10-09 MED ORDER — DOCUSATE SODIUM 100 MG PO CAPS
100.0000 mg | ORAL_CAPSULE | Freq: Two times a day (BID) | ORAL | Status: DC
  Administered 2017-10-09 – 2017-10-15 (×12): 100 mg via ORAL
  Filled 2017-10-09 (×13): qty 1

## 2017-10-09 MED ORDER — BISACODYL 10 MG RE SUPP
10.0000 mg | Freq: Every day | RECTAL | Status: DC | PRN
  Administered 2017-10-15: 10 mg via RECTAL
  Filled 2017-10-09: qty 1

## 2017-10-09 MED ORDER — BUPROPION HCL ER (SR) 150 MG PO TB12
150.0000 mg | ORAL_TABLET | Freq: Every day | ORAL | Status: DC
  Administered 2017-10-10 – 2017-10-21 (×11): 150 mg via ORAL
  Filled 2017-10-09 (×11): qty 1

## 2017-10-09 MED ORDER — LEVOTHYROXINE SODIUM 100 MCG PO TABS
100.0000 ug | ORAL_TABLET | Freq: Every day | ORAL | Status: DC
  Administered 2017-10-10 – 2017-10-21 (×12): 100 ug via ORAL
  Filled 2017-10-09 (×12): qty 1

## 2017-10-09 MED ORDER — ONDANSETRON HCL 4 MG/2ML IJ SOLN
4.0000 mg | Freq: Once | INTRAMUSCULAR | Status: AC
  Administered 2017-10-09: 4 mg via INTRAVENOUS
  Filled 2017-10-09: qty 2

## 2017-10-09 MED ORDER — METHOCARBAMOL 500 MG PO TABS
500.0000 mg | ORAL_TABLET | Freq: Once | ORAL | Status: AC
  Administered 2017-10-09: 500 mg via ORAL
  Filled 2017-10-09: qty 1

## 2017-10-09 MED ORDER — SODIUM CHLORIDE 0.9 % IV SOLN
INTRAVENOUS | Status: DC
  Administered 2017-10-09 – 2017-10-10 (×2): via INTRAVENOUS

## 2017-10-09 MED ORDER — VERAPAMIL HCL ER 120 MG PO TBCR
120.0000 mg | EXTENDED_RELEASE_TABLET | Freq: Every day | ORAL | Status: DC
  Administered 2017-10-09 – 2017-10-20 (×12): 120 mg via ORAL
  Filled 2017-10-09 (×12): qty 1

## 2017-10-09 MED ORDER — PREDNISONE 20 MG PO TABS
60.0000 mg | ORAL_TABLET | Freq: Once | ORAL | Status: AC
  Administered 2017-10-09: 60 mg via ORAL
  Filled 2017-10-09: qty 3

## 2017-10-09 MED ORDER — FENTANYL CITRATE (PF) 100 MCG/2ML IJ SOLN
50.0000 ug | INTRAMUSCULAR | Status: DC | PRN
  Administered 2017-10-12 – 2017-10-15 (×3): 50 ug via INTRAVENOUS
  Filled 2017-10-09 (×3): qty 2

## 2017-10-09 MED ORDER — PREDNISONE 20 MG PO TABS
60.0000 mg | ORAL_TABLET | Freq: Every day | ORAL | Status: DC
  Administered 2017-10-10 – 2017-10-11 (×2): 60 mg via ORAL
  Filled 2017-10-09 (×2): qty 3

## 2017-10-09 MED ORDER — ONDANSETRON HCL 4 MG PO TABS: 4.0000 mg | ORAL_TABLET | Freq: Four times a day (QID) | ORAL | Status: DC | PRN

## 2017-10-09 MED ORDER — SODIUM CHLORIDE 0.9 % IV BOLUS
1000.0000 mL | Freq: Once | INTRAVENOUS | Status: AC
  Administered 2017-10-09: 1000 mL via INTRAVENOUS

## 2017-10-09 MED ORDER — DICLOFENAC SODIUM 1 % TD GEL
2.0000 g | TRANSDERMAL | Status: DC | PRN
  Administered 2017-10-09 – 2017-10-14 (×7): 2 g via TOPICAL
  Filled 2017-10-09: qty 100

## 2017-10-09 MED ORDER — ONDANSETRON HCL 4 MG/2ML IJ SOLN
4.0000 mg | Freq: Four times a day (QID) | INTRAMUSCULAR | Status: DC | PRN
  Administered 2017-10-16 – 2017-10-20 (×4): 4 mg via INTRAVENOUS
  Filled 2017-10-09 (×4): qty 2

## 2017-10-09 MED ORDER — FENTANYL CITRATE (PF) 100 MCG/2ML IJ SOLN
50.0000 ug | INTRAMUSCULAR | Status: DC | PRN
  Administered 2017-10-09: 50 ug via INTRAVENOUS
  Filled 2017-10-09 (×2): qty 2

## 2017-10-09 MED ORDER — WARFARIN - PHARMACIST DOSING INPATIENT: Freq: Every day | Status: DC

## 2017-10-09 MED ORDER — HYDROCODONE-ACETAMINOPHEN 7.5-325 MG PO TABS
1.0000 | ORAL_TABLET | Freq: Four times a day (QID) | ORAL | Status: DC | PRN
  Administered 2017-10-09 – 2017-10-15 (×13): 1 via ORAL
  Filled 2017-10-09 (×14): qty 1

## 2017-10-09 MED ORDER — FERROUS SULFATE 325 (65 FE) MG PO TABS
325.0000 mg | ORAL_TABLET | Freq: Every day | ORAL | Status: DC
  Administered 2017-10-10 – 2017-10-21 (×11): 325 mg via ORAL
  Filled 2017-10-09 (×11): qty 1

## 2017-10-09 MED ORDER — LOSARTAN POTASSIUM 50 MG PO TABS
100.0000 mg | ORAL_TABLET | Freq: Every day | ORAL | Status: DC
  Administered 2017-10-10 – 2017-10-21 (×11): 100 mg via ORAL
  Filled 2017-10-09 (×11): qty 2

## 2017-10-09 MED ORDER — ACETAMINOPHEN 650 MG RE SUPP: 650.0000 mg | Freq: Four times a day (QID) | RECTAL | Status: DC | PRN

## 2017-10-09 MED ORDER — IOHEXOL 300 MG/ML  SOLN
75.0000 mL | Freq: Once | INTRAMUSCULAR | Status: AC | PRN
  Administered 2017-10-09: 75 mL via INTRAVENOUS

## 2017-10-09 MED ORDER — HYDROCODONE-ACETAMINOPHEN 7.5-325 MG PO TABS
1.0000 | ORAL_TABLET | Freq: Once | ORAL | Status: AC
  Administered 2017-10-09: 1 via ORAL
  Filled 2017-10-09: qty 1

## 2017-10-09 MED ORDER — METHOCARBAMOL 500 MG PO TABS
500.0000 mg | ORAL_TABLET | Freq: Three times a day (TID) | ORAL | Status: DC | PRN
  Administered 2017-10-09 – 2017-10-19 (×8): 500 mg via ORAL
  Filled 2017-10-09 (×9): qty 1

## 2017-10-09 MED ORDER — TRAZODONE HCL 100 MG PO TABS
100.0000 mg | ORAL_TABLET | Freq: Every evening | ORAL | Status: DC | PRN
  Administered 2017-10-15 – 2017-10-18 (×4): 100 mg via ORAL
  Filled 2017-10-09 (×4): qty 1

## 2017-10-09 MED ORDER — FENTANYL CITRATE (PF) 100 MCG/2ML IJ SOLN
50.0000 ug | Freq: Once | INTRAMUSCULAR | Status: AC
  Administered 2017-10-09: 50 ug via INTRAVENOUS

## 2017-10-09 NOTE — ED Notes (Signed)
Report given to [facility]. Patient in CT.

## 2017-10-09 NOTE — Progress Notes (Signed)
PT Cancellation Note  Patient Details Name: Paula Ferguson MRN: [id number] DOB: [birth year]   Cancelled Treatment:    Reason Eval/Treat Not Completed: Patient not medically ready; order received and chart reviewed.  Noted pt for ortho eval due to knee warmth and pain.  Will hold PT until after ortho recommendations.   Reginia Naas 10/09/2017, 2:26 PM Magda Kiel, [place] 10/09/2017

## 2017-10-09 NOTE — H&P (Signed)
History and Physical    Paula Ferguson:[id number] DOB: [birth year] DOA: 10/09/2017  PCP: Marletta Lor, MD   Patient coming from: Home  Chief Complaint: Knee Pain and Fall   HPI: Paula Ferguson is a 77 y.o. female with medical history significant of CAD s/p CABG x1 and Hx of AVR, HTN, HLD, Hypothyroidism, Anemia, Depression and other comorbidites who presents with worsened Knee pain and a fall.  She has had had Knee Pain Bilateral for at least 6 years and acutely worsened yesterday morning and specifically in her Right Knee. She described it as a stabbing pain. Knee pain is a 10/10 No radiation.  She states that because of her fall she lay on the floor for the whole night because her Paula Ferguson was unable to pick her up and she refused called EMS.  Paula Ferguson family stated that she is calling EMS and they brought her to the emergency room for further evaluation.  He also complained of +Lightheadedness, Dizziness, Weakness and progressively gotten worse the last few months and is more fatigued and exhausted. No CP, Abdominal, No N/V/Diarrhe, no Burning Discomfort but has had increased frequency.  Initial evaluation in the ED showed that she had negative x-rays however she is unable to ambulate and was concerned that she would fall if she even attempted.  [facility] was called to admit this patient for her right knee pain and fall as well as mild rhabdomyolysis.  ED Course: In the ED patient had basic blood work done as well as imaging studies of the hip and chest as well as right knee.  Orthopedics was called for further evaluation recommendations.  Patient was also started on normal saline at 75 mL/per hour.  Review of Systems: As per HPI otherwise 10 point review of systems negative.   Past Medical History:  Diagnosis Date  . ANEMIA 09/15/2009  . Blood transfusion 2000's; 11/17/2011   S/P "one of my other shoulder surgeries"; S/P left shoulder replacement  . CORONARY ARTERY DISEASE  08/26/2007   LOV Dr Tamala Julian 1/13 with EKG and clearance on chart  . Depression   . Heart murmur    hx- surgery  . HYPERLIPIDEMIA 02/07/2007  . HYPERTENSION 09/14/2006  . HYPOTHYROIDISM 09/14/2006  . LIVER FUNCTION TESTS, ABNORMAL, HX OF 01/12/2009  . OLECRANON BURSITIS, LEFT 08/26/2007  . OSTEOARTHRITIS 02/07/2007   "thumbs; knees; shoulders; ?back"  . Pneumonia    hx  . PTSD (post-traumatic stress disorder) 1976   Past Surgical History:  Procedure Laterality Date  . ABDOMINAL HYSTERECTOMY  1981  . APPENDECTOMY  1981  . CARDIAC CATHETERIZATION  2003  . CARDIAC VALVE REPLACEMENT  2003   "aortic"  . CARPAL TUNNEL RELEASE  1990's   bilaterally  . CHOLECYSTECTOMY  1990's  . COLONOSCOPY    . CORONARY ARTERY BYPASS GRAFT  2003   CABG X1 "mammary" w/aortic valve replacement   . DILATION AND CURETTAGE OF UTERUS  1981?  . EXPLORATORY LAPAROTOMY  ~ 1981  . FRACTURE SURGERY    . JOINT REPLACEMENT     right shoulder arthroscopy, left partial knee replacement  . KNEE ARTHROSCOPY  1980's; 2012   left; right  . PARTIAL KNEE ARTHROPLASTY  1990's   left  . REVERSE SHOULDER ARTHROPLASTY  11/17/2011   Procedure: REVERSE SHOULDER ARTHROPLASTY;  Surgeon: Augustin Schooling, MD;  Location: [facility];  Service: Orthopedics;  Laterality: Left;  left reverse shoulder arthroplasty  . SHOULDER ARTHROSCOPY  2000's   right; "3 surgeries";  .  SHOULDER ARTHROSCOPY W/ ROTATOR CUFF REPAIR  1990's  . STERIOD INJECTION  11/17/2011   Procedure: STEROID INJECTION;  Surgeon: Augustin Schooling, MD;  Location: [facility];  Service: Orthopedics;  Laterality: Right;  cortizone injection right thumb  . TONSILLECTOMY AND ADENOIDECTOMY  ~ 1964  . TOTAL KNEE ARTHROPLASTY  03/21/2011   Procedure: TOTAL KNEE ARTHROPLASTY;  Surgeon: Mauri Pole, MD;  Location: [facility];  Service: Orthopedics;  Laterality: Right;  . TOTAL SHOULDER REPLACEMENT  2012; 11/17/2011   right; left  . TRANSFER / TRANSPLANT ANKLE TENDON SUPERFICIAL / DEEP  1990's    "bunch of surgeries after dr ruptured posterior tibial tendon", right foot  . v43.3     heart valve   SOCIAL HISTORY  reports that she has never smoked. She has never used smokeless tobacco. She reports that she does not drink alcohol or use drugs.  Allergies  Allergen Reactions  . Hydromorphone Hcl Itching    "Dilaudid"  . Meperidine Hcl Itching  . Morphine Sulfate Itching  . Percocet [Oxycodone-Acetaminophen] Itching   Family History  Problem Relation Age of Onset  . Lung disease Mother   . Lung disease Father   . Healthy Sister   . Kidney disease Brother    Prior to Admission medications   Medication Sig Start Date End Date Taking? Authorizing Provider  amoxicillin (AMOXIL) 500 MG capsule TAKE 4 TABLETS BY MOUTH 1/2 HOUR PRIOR TO DENTAL APPOINTMENT. Patient taking differently: Take 2,000 mg by mouth as needed (For dental procedures.).  09/01/16  Yes Marletta Lor, MD  atorvastatin (LIPITOR) 20 MG tablet Take 1 tablet (20 mg total) by mouth daily. 07/04/17  Yes Marletta Lor, MD  BIOTIN PO Take 1 tablet by mouth daily.   Yes [provider]  buPROPion (WELLBUTRIN SR) 150 MG 12 hr tablet TAKE 1 TABLET BY MOUTH DAILY. Patient taking differently: Take 150 mg by mouth daily.  01/19/17  Yes Marletta Lor, MD  diclofenac sodium (VOLTAREN) 1 % GEL Apply 2 g topically as needed (For pain.).    Yes [provider]  ferrous sulfate 325 (65 FE) MG tablet Take 325 mg by mouth daily.    Yes [provider]  HYDROcodone-acetaminophen (NORCO) 7.5-325 MG tablet Take 1 tablet by mouth every 6 (six) hours as needed for moderate pain. 09/05/17  Yes Marletta Lor, MD  levothyroxine (SYNTHROID, LEVOTHROID) 100 MCG tablet TAKE 1 TABLET BY MOUTH DAILY. Patient taking differently: Take 100 mcg by mouth daily before breakfast.  05/29/17  Yes Marletta Lor, MD  losartan (COZAAR) 100 MG tablet TAKE 1 TABLET BY MOUTH DAILY. Patient taking  differently: Take 100 mg by mouth daily.  12/11/16  Yes Marletta Lor, MD  Menthol, Topical Analgesic, (BIOFREEZE ROLL-ON EX) Apply 1 application topically as needed (Applies to knees for pain.).   Yes [provider]  methocarbamol (ROBAXIN) 500 MG tablet Take 1 tablet (500 mg total) by mouth 3 (three) times daily as needed. Patient taking differently: Take 500 mg by mouth 3 (three) times daily as needed for muscle spasms.  09/05/17  Yes Marletta Lor, MD  traZODone (DESYREL) 100 MG tablet TAKE 1 TABLET BY MOUTH AT BEDTIME AS NEEDED FOR SLEEP. Patient taking differently: Take 50-100 mg by mouth at bedtime as needed for sleep.  06/04/17  Yes Marletta Lor, MD  verapamil (CALAN-SR) 240 MG CR tablet TAKE 1/2 TABLET BY MOUTH AT BEDTIME. Patient taking differently: Take 120 mg by mouth  at bedtime.  06/04/17  Yes Marletta Lor, MD  warfarin (COUMADIN) 5 MG tablet TAKE 1 TABLET BY MOUTH AS DIRECTED. Patient taking differently: Take 2.5-5 mg by mouth every evening. Take one tablet on Sunday, Tuesday, Thursday and half a tablet the rest of the week. 12/20/16  Yes Marletta Lor, MD   Physical Exam: Vitals:   10/09/17 1100 10/09/17 1130 10/09/17 1200 10/09/17 1230  BP: 136/75 130/73 (!) 149/74 (!) 143/69  Pulse: 98 94 98 96  Resp: 14 14 20 12   Temp:      TempSrc:      SpO2: 96% 95% 96% 96%  Weight:      Height:       Constitutional: Thin Caucasian female in some Pain and appears calm but very uncomfortable Eyes: Lids and conjunctivae normal, sclerae anicteric  ENMT: External Ears, Nose appear normal. Grossly normal hearing. Neck: Appears normal, supple, no cervical masses, normal ROM, no appreciable thyromegaly, no JVD Respiratory: Diminished to auscultation bilaterally, no wheezing, rales, rhonchi or crackles.. No accessory muscle use.  Cardiovascular: RRR, no murmurs / rubs / gallops. S1 and S2 auscultated. Has some Trace extremity edema.  Abdomen: Soft,  non-tender, non-distended. No masses palpated. No appreciable hepatosplenomegaly. Bowel sounds positive x4.  GU: Deferred. Musculoskeletal: No clubbing / cyanosis of digits/nails. Right knee extremely swollen compared to left with some warmth but no erythema Skin: No rashes, lesions, ulcers on a limited skin evaluation. No induration; Warm and dry.  Neurologic: CN 2-12 grossly intact with no focal deficits. Romberg sign and cerebellar reflexes not assessed.  Psychiatric: Normal judgment and insight. Alert and oriented x 3. Tearful mood and appropriate affect.   Labs on Admission: I have personally reviewed following labs and imaging studies  CBC: Recent Labs  Lab 10/09/17 0922  WBC 14.2*  NEUTROABS 12.0*  HGB 11.4*  HCT 34.2*  MCV 96.3  PLT 643   Basic Metabolic Panel: Recent Labs  Lab 10/09/17 0922  NA 140  K 3.8  CL 105  CO2 25  GLUCOSE 92  BUN 21  CREATININE 0.79  CALCIUM 9.0   GFR: Estimated Creatinine Clearance: 47.3 mL/min (by C-G formula based on SCr of 0.79 mg/dL). Liver Function Tests: No results for input(s): AST, ALT, ALKPHOS, BILITOT, PROT, ALBUMIN in the last 168 hours. No results for input(s): LIPASE, AMYLASE in the last 168 hours. No results for input(s): AMMONIA in the last 168 hours. Coagulation Profile: Recent Labs  Lab 10/05/17 1406 10/09/17 0922  INR 4.9* 1.92   Cardiac Enzymes: Recent Labs  Lab 10/09/17 0922  CKTOTAL 1,793*   BNP (last 3 results) No results for input(s): PROBNP in the last 8760 hours. HbA1C: No results for input(s): HGBA1C in the last 72 hours. CBG: No results for input(s): GLUCAP in the last 168 hours. Lipid Profile: No results for input(s): CHOL, HDL, LDLCALC, TRIG, CHOLHDL, LDLDIRECT in the last 72 hours. Thyroid Function Tests: No results for input(s): TSH, T4TOTAL, FREET4, T3FREE, THYROIDAB in the last 72 hours. Anemia Panel: No results for input(s): VITAMINB12, FOLATE, FERRITIN, TIBC, IRON, RETICCTPCT in the  last 72 hours. Urine analysis:    Component Value Date/Time   COLORURINE YELLOW 03/14/2011 1520   APPEARANCEUR CLOUDY (A) 03/14/2011 1520   LABSPEC 1.025 03/14/2011 1520   PHURINE 5.5 03/14/2011 1520   GLUCOSEU NEGATIVE 03/14/2011 1520   HGBUR SMALL (A) 03/14/2011 1520   HGBUR negative 04/09/2008 1252   BILIRUBINUR n 03/01/2017 [place] 03/14/2011 1520  PROTEINUR 1+ 03/01/2017 1544   PROTEINUR NEGATIVE 03/14/2011 1520   UROBILINOGEN 1.0 03/01/2017 1544   UROBILINOGEN 1.0 03/14/2011 1520   NITRITE n 03/01/2017 1544   NITRITE NEGATIVE 03/14/2011 1520   LEUKOCYTESUR Negative 03/01/2017 1544   Sepsis Labs: !!!!!!!!!!!!!!!!!!!!!!!!!!!!!!!!!!!!!!!!!!!! @LABRCNTIP (procalcitonin:4,lacticidven:4) )No results found for this or any previous visit (from the past 240 hour(s)).   Radiological Exams on Admission: Dg Chest 1 View  Result Date: 10/09/2017 CLINICAL DATA:  Status post fall.  Right hip pain EXAM: CHEST  1 VIEW COMPARISON:  None. FINDINGS: The heart size and mediastinal contours are within normal limits. Prior CABG. Both lungs are clear. No acute osseous abnormality. Bilateral total reverse shoulder arthroplasties. IMPRESSION: No active disease. Electronically Signed   By: Kathreen Devoid   On: 10/09/2017 10:08   Dg Knee Complete 4 Views Right  Result Date: 10/09/2017 CLINICAL DATA:  Pain following fall EXAM: RIGHT KNEE - COMPLETE 4+ VIEW COMPARISON:  None. FINDINGS: Frontal, lateral common bile oblique views were obtained. The patient is status post total knee replacement with femoral and tibial prosthetic components appearing well-seated. There is no acute fracture or dislocation. No appreciable joint effusion evident. No erosive change. There are foci of calcification in the popliteal artery. IMPRESSION: Status post total knee replacement with prosthetic components well-seated. No fracture or dislocation. No appreciable joint effusion. Popliteal artery atherosclerosis  noted. Electronically Signed   By: Lowella Grip III M.D.   On: 10/09/2017 10:13   Dg Hip Unilat With Pelvis 2-3 Views Right  Result Date: 10/09/2017 CLINICAL DATA:  Pain following fall EXAM: DG HIP (WITH OR WITHOUT PELVIS) 2-3V RIGHT COMPARISON:  None. FINDINGS: Frontal pelvis as well as frontal and lateral right hip images were obtained. No fracture or dislocation is evident. There is mild symmetric narrowing of both hip joints. There are exostoses arising from each lateral iliac crest with pseudoarthrosis in the area of exostosis on the left laterally. IMPRESSION: No evident acute fracture or dislocation. Mild symmetric narrowing both hip joints. Exostoses arising from each iliac crest with pseudoarthrosis in this area on the left. Electronically Signed   By: Lowella Grip III M.D.   On: 10/09/2017 10:11   EKG: Independently reviewed. Showed Sinus Tachycardia with no evidence of ST Elevation on my evaluation.   Assessment/Plan Active Problems:   Hypothyroidism   Hyperlipidemia   ANEMIA   Essential hypertension   Coronary atherosclerosis   S/P right knee replacement   Anticoagulation goal of INR 2.5 to 3.5   History of mechanical aortic valve replacement   Chronic pain   Knee pain, acute   Fall   Leukocytosis   Rhabdomyolysis   Dizziness   Lightheadedness   Generalized weakness  Acute Fall in the Setting of Intractable Knee Pain with Swelling  -Place in Obs Med-Surge -Right Kneed showed Status post total knee replacement with prosthetic components well-seated. No fracture or dislocation. No appreciable joint effusion. Popliteal artery atherosclerosis noted. -Right Hip X-Ray showed No evident acute fracture or dislocation. Mild symmetric narrowing both hip joints. Exostoses arising from each iliac crest with pseudoarthrosis in this area on the left. -Check CT of Right Knee -? Gout Flare so was started on Prednisone 60 mg po given that she cannot have NSAIDs because of  Cardiac Hx; Will continue Daily for 5 Days Total -Consult Ortho for further evaluation and reccomendations  -Patient has Knee replaced and was somewhat warm; ? Septic Arthritis. TMax was 99.5 and WBC was 14.2 -Hold off on Abx at this  time and await further Ortho Evaluation and Recommendations   Leukocytosis -Pateint's WBC on Admission was 14.2 and had a TMax of 99.5 -Likely 2/2 to Acute Fall but has a Hx of Knee Replacement and Prosthesis -Check Blood Cx x2 -Hold of on Abx at this Time  -Repeat CBC in AM   Mild Rhabdomyolysis/Elevated CK -In the setting of Fall and Trauma  -CK on Admission was 1738 -Given 1 Liter NS bolus and then start Maintenance IVF at a rate of 75 mL/hr Continue to Trend and Repeat CK in AM   Dizziness/Lightheadeness/Generalized Weakness -Has been getting worse the last several Months and states it happens when she stands and states that she has to lay down for it to improve -Check TSH -Check Urinalysis and Urine Cx -Check Orthostatic Vital Signs -C/w IVF Hydration with NS as above -PT/OT to Evaluate and Treat  Normocytic Anemia -Patient's Hb/Hct was 11.4/34.2 on Admission -C/w Ferrous Sulfate 325 mg po Daily  -Check Anemia Panel  -Continue to Monitor for S/Sx of Bleeding in the setting of Coumadin Use -Repeat CBC in AM   Hx of CAD and Aortic Valve Replacement -C/w Losartan 100 mg po Daily, Verabpamil 120 mg po qHS, Hold Atorvastatin 20 mg po qHS -C/w Coumadin with Pharmacy to Dose; INR today was Sub-Therapeutic.   Hypertension -C/w Losartan 100 mg po Daily   HLD -Hold Atorvastatin 20 mg po Daily given mild Rhabdomyolysis   Hypothyroidism -Check TSH -C/w 100 mcg po Daily   Depression -C/w Bupropion 150 mg po Daily   DVT prophylaxis: Anticoagulated with Coumadin  Code Status: FULL CODE Family Communication: Discussed with  Disposition Plan: Pending PT and OT Evaluation Consults called: Orthopedic Surgery  Admission status: Obs  Telemetry  Severity of Illness: The appropriate patient status for this patient is OBSERVATION. Observation status is judged to be reasonable and necessary in order to provide the required intensity of service to ensure the patient's safety. The patient's presenting symptoms, physical exam findings, and initial radiographic and laboratory data in the context of their medical condition is felt to place them at decreased risk for further clinical deterioration. Furthermore, it is anticipated that the patient will be medically stable for discharge from the hospital within 2 midnights of admission. The following factors support the patient status of observation.   " The patient's presenting symptoms include Fall with Right Knee Pain. " The physical exam findings include Right knee swelling, mild warmth but no erythema and pain on palpation. " The initial radiographic and laboratory data are unremarkable and mildly elevated Leukocytosis and showing Rhabdomyolysis.  Kerney Elbe, D.O. [facility] Pager [phone]  If 7PM-7AM, please contact night-coverage [email or web address] Password [email or web address]  10/09/2017, 12:50 PM

## 2017-10-09 NOTE — Progress Notes (Signed)
ANTICOAGULATION CONSULT NOTE - Initial Consult  Pharmacy Consult for warfarin Indication: mechanical valve  Allergies  Allergen Reactions  . Hydromorphone Hcl Itching    "Dilaudid"  . Meperidine Hcl Itching  . Morphine Sulfate Itching  . Percocet [Oxycodone-Acetaminophen] Itching    Patient Measurements: Height: 5\' 2"  (157.5 cm) Weight: 122 lb (55.3 kg) IBW/kg (Calculated) : 50.1 Heparin Dosing Weight:   Vital Signs: Temp: 99.1 F (37.3 C) (08/20 1441) Temp Source: Oral (08/20 1441) BP: 146/65 (08/20 1441) Pulse Rate: 99 (08/20 1441)  Labs: Recent Labs    10/09/17 0922  HGB 11.4*  HCT 34.2*  PLT 153  LABPROT 21.8*  INR 1.92  CREATININE 0.79  CKTOTAL 1,802*  1,793*  CKMB 10.5*    Estimated Creatinine Clearance: 47.3 mL/min (by C-G formula based on SCr of 0.79 mg/dL).   Medical History: Past Medical History:  Diagnosis Date  . ANEMIA 09/15/2009  . Blood transfusion 2000's; 11/17/2011   S/P "one of my other shoulder surgeries"; S/P left shoulder replacement  . CORONARY ARTERY DISEASE 08/26/2007   LOV Dr Tamala Julian 1/13 with EKG and clearance on chart  . Depression   . Heart murmur    hx- surgery  . HYPERLIPIDEMIA 02/07/2007  . HYPERTENSION 09/14/2006  . HYPOTHYROIDISM 09/14/2006  . LIVER FUNCTION TESTS, ABNORMAL, HX OF 01/12/2009  . OLECRANON BURSITIS, LEFT 08/26/2007  . OSTEOARTHRITIS 02/07/2007   "thumbs; knees; shoulders; ?back"  . Pneumonia    hx  . PTSD (post-traumatic stress disorder) 1976    Medications:  Scheduled:  . [START ON 10/10/2017] buPROPion  150 mg Oral Daily  . docusate sodium  100 mg Oral BID  . ferrous sulfate  325 mg Oral Daily  . [START ON 10/10/2017] levothyroxine  100 mcg Oral QAC breakfast  . [START ON 10/10/2017] losartan  100 mg Oral Daily  . [START ON 10/10/2017] predniSONE  60 mg Oral Q breakfast  . verapamil  120 mg Oral QHS    Assessment: Pharmacy is consulted to dose warfarin in 77 yo female with PMH of mechanical aortic vale  replacement.   Pt with recent to Coumadin clinic on 8/16 where pt INR was 4.9. Pt was instructed to skip doses for two days then resume med on 8/18. Per med rec records it does not appear restarted med yet.   The home med regimen was changed to Warfarin 2.5 mg PO daily except to take 5 mg  On mondays and Fridays   Labs upon admission today, 10/09/17   Pt 21.8, INR 1.92  Hgb 11.4, plt 153   No bleeding noted   Pt to start heart healthy diet upon admission   Goal of Therapy:  INR 2-3 Monitor platelets by anticoagulation protocol: Yes   Plan:   Warfarin 2.5 mg PO x1   Monitor for signs and symptoms of bleeding  Daily INR   Royetta Asal, PharmD, BCPS Pager [phone] 10/09/2017 4:58 PM

## 2017-10-09 NOTE — ED Notes (Signed)
2 sets of cultures collected

## 2017-10-09 NOTE — ED Provider Notes (Signed)
[facility] DEPT Provider Note   CSN: [id number] Arrival date & time: 10/09/17  0848     History   Chief Complaint Chief Complaint  Patient presents with  . Fall    HPI Paula Ferguson is a 77 y.o. female.  Patient is a 77 year old female with multiple medical problems including coronary artery disease status post CABG and valve replacement on Coumadin, hypertension, multiple orthopedic surgeries and chronic pain who is presenting today after a fall last night at 745.  Patient states that she fell off the couch and hit the coffee table on the way down.  She got wedged between the couch in the coffee table and was unable to get up.  She landed on a carpeted floor.  She denies any head injury or loss of consciousness.  She did not feel presyncopal before this.  Patient does have a history of falls given all of her surgeries she is not steady on her feet.  Her daughter was home and patient refused to call EMS.  She thought she would be able to get up later but after lying on the floor all night long she was still having severe pain in her right hip and knee and did not think she could get up.  She denies any chest pain, shortness of breath, abdominal pain.  She was incontinent of urine throughout the night because she could not get up.  She has normal sensation of her foot on the right  The history is provided by the patient and the EMS personnel.  Fall  This is a new problem. The current episode started yesterday (at 7:45 pm last night). The problem occurs constantly. The problem has not changed since onset.Associated symptoms comments: Right hip and knee pain.  No LOC or head injury.  No neck pain.. The symptoms are aggravated by bending and twisting. The symptoms are relieved by rest. She has tried nothing for the symptoms. The treatment provided no relief.    Past Medical History:  Diagnosis Date  . ANEMIA 09/15/2009  . Blood transfusion 2000's; 11/17/2011     S/P "one of my other shoulder surgeries"; S/P left shoulder replacement  . CORONARY ARTERY DISEASE 08/26/2007   LOV Dr Tamala Julian 1/13 with EKG and clearance on chart  . Depression   . Heart murmur    hx- surgery  . HYPERLIPIDEMIA 02/07/2007  . HYPERTENSION 09/14/2006  . HYPOTHYROIDISM 09/14/2006  . LIVER FUNCTION TESTS, ABNORMAL, HX OF 01/12/2009  . OLECRANON BURSITIS, LEFT 08/26/2007  . OSTEOARTHRITIS 02/07/2007   "thumbs; knees; shoulders; ?back"  . Pneumonia    hx  . PTSD (post-traumatic stress disorder) 1976    Patient Active Problem List   Diagnosis Date Noted  . Neutropenia ([place]) 10/03/2017  . Encounter for therapeutic drug monitoring 09/03/2017  . Chronic pain 03/01/2017  . History of mechanical aortic valve replacement 11/22/2012  . Anticoagulation goal of INR 2.5 to 3.5 09/11/2011  . S/P right knee replacement 03/21/2011  . ANEMIA 09/15/2009  . LIVER FUNCTION TESTS, ABNORMAL, HX OF 01/12/2009  . Coronary atherosclerosis 08/26/2007  . OLECRANON BURSITIS, LEFT 08/26/2007  . Hyperlipidemia 02/07/2007  . Osteoarthritis 02/07/2007  . Hypothyroidism 09/14/2006  . Essential hypertension 09/14/2006    Past Surgical History:  Procedure Laterality Date  . ABDOMINAL HYSTERECTOMY  1981  . APPENDECTOMY  1981  . CARDIAC CATHETERIZATION  2003  . CARDIAC VALVE REPLACEMENT  2003   "aortic"  . CARPAL TUNNEL RELEASE  1990's   bilaterally  .  CHOLECYSTECTOMY  1990's  . COLONOSCOPY    . CORONARY ARTERY BYPASS GRAFT  2003   CABG X1 "mammary" w/aortic valve replacement   . DILATION AND CURETTAGE OF UTERUS  1981?  . EXPLORATORY LAPAROTOMY  ~ 1981  . FRACTURE SURGERY    . JOINT REPLACEMENT     right shoulder arthroscopy, left partial knee replacement  . KNEE ARTHROSCOPY  1980's; 2012   left; right  . PARTIAL KNEE ARTHROPLASTY  1990's   left  . REVERSE SHOULDER ARTHROPLASTY  11/17/2011   Procedure: REVERSE SHOULDER ARTHROPLASTY;  Surgeon: Augustin Schooling, MD;  Location: [facility];   Service: Orthopedics;  Laterality: Left;  left reverse shoulder arthroplasty  . SHOULDER ARTHROSCOPY  2000's   right; "3 surgeries";  Marland Kitchen SHOULDER ARTHROSCOPY W/ ROTATOR CUFF REPAIR  1990's  . STERIOD INJECTION  11/17/2011   Procedure: STEROID INJECTION;  Surgeon: Augustin Schooling, MD;  Location: [facility];  Service: Orthopedics;  Laterality: Right;  cortizone injection right thumb  . TONSILLECTOMY AND ADENOIDECTOMY  ~ 1964  . TOTAL KNEE ARTHROPLASTY  03/21/2011   Procedure: TOTAL KNEE ARTHROPLASTY;  Surgeon: Mauri Pole, MD;  Location: [facility];  Service: Orthopedics;  Laterality: Right;  . TOTAL SHOULDER REPLACEMENT  2012; 11/17/2011   right; left  . TRANSFER / TRANSPLANT ANKLE TENDON SUPERFICIAL / DEEP  1990's   "bunch of surgeries after dr ruptured posterior tibial tendon", right foot  . v43.3     heart valve     OB History   None      Home Medications    Prior to Admission medications   Medication Sig Start Date End Date Taking? Authorizing Provider  amoxicillin (AMOXIL) 500 MG capsule TAKE 4 TABLETS BY MOUTH 1/2 HOUR PRIOR TO DENTAL APPOINTMENT. 09/01/16   Marletta Lor, MD  atorvastatin (LIPITOR) 20 MG tablet Take 1 tablet (20 mg total) by mouth daily. 07/04/17   Marletta Lor, MD  BIOTIN PO Take 1 tablet by mouth daily.    [provider]  buPROPion (WELLBUTRIN SR) 150 MG 12 hr tablet TAKE 1 TABLET BY MOUTH DAILY. 01/19/17   Marletta Lor, MD  Calcium Citrate-Vitamin D (CITRACAL PETITES/VITAMIN D PO) Take 1 tablet by mouth daily.     [provider]  cholecalciferol (VITAMIN D) 1000 UNITS tablet Take 1,000 Units by mouth daily.    [provider]  Cyanocobalamin (VITAMIN B-12 PO) Take 1 tablet by mouth daily.    [provider]  HYDROcodone-acetaminophen (NORCO) 7.5-325 MG tablet Take 1 tablet by mouth every 6 (six) hours as needed for moderate pain. 09/05/17   Marletta Lor, MD  levothyroxine (SYNTHROID, LEVOTHROID) 100  MCG tablet TAKE 1 TABLET BY MOUTH DAILY. 05/29/17   Marletta Lor, MD  losartan (COZAAR) 100 MG tablet TAKE 1 TABLET BY MOUTH DAILY. 12/11/16   Marletta Lor, MD  methocarbamol (ROBAXIN) 500 MG tablet Take 1 tablet (500 mg total) by mouth 3 (three) times daily as needed. 09/05/17   Marletta Lor, MD  Multiple Vitamins-Minerals (CENTRUM PO) Take 1 tablet by mouth daily.    [provider]  traZODone (DESYREL) 100 MG tablet TAKE 1 TABLET BY MOUTH AT BEDTIME AS NEEDED FOR SLEEP. 06/04/17   Marletta Lor, MD  verapamil (CALAN-SR) 240 MG CR tablet TAKE 1/2 TABLET BY MOUTH AT BEDTIME. 06/04/17   Marletta Lor, MD  warfarin (COUMADIN) 5 MG tablet TAKE 1 TABLET BY MOUTH AS DIRECTED. 12/20/16  Marletta Lor, MD    Family History Family History  Problem Relation Age of Onset  . Lung disease Mother   . Lung disease Father   . Healthy Sister   . Kidney disease Brother     Social History Social History   Tobacco Use  . Smoking status: Never Smoker  . Smokeless tobacco: Never Used  Substance Use Topics  . Alcohol use: No  . Drug use: No     Allergies   Hydromorphone hcl; Meperidine hcl; Morphine sulfate; and Percocet [oxycodone-acetaminophen]   Review of Systems Review of Systems  All other systems reviewed and are negative.    Physical Exam Updated Vital Signs BP (!) 149/79 (BP Location: Left Arm)   Pulse (!) 103   Temp 99.1 F (37.3 C)   Resp 19   Ht 5\' 2"  (1.575 m)   Wt 55.3 kg   SpO2 98%   BMI 22.31 kg/m   Physical Exam  Constitutional: She is oriented to person, place, and time. She appears well-developed and well-nourished. No distress.  HENT:  Head: Normocephalic and atraumatic.  Mouth/Throat: Oropharynx is clear and moist.  Eyes: Pupils are equal, round, and reactive to light. Conjunctivae and EOM are normal.  Neck: Normal range of motion. Neck supple.  No cervical spine tenderness.  Full range of motion without  pain.  Cardiovascular: Regular rhythm and intact distal pulses. Tachycardia present.  No murmur heard. Pulmonary/Chest: Effort normal and breath sounds normal. No respiratory distress. She has no wheezes. She has no rales.      Abdominal: Soft. She exhibits no distension. There is no tenderness. There is no rebound and no guarding.  Musculoskeletal: She exhibits tenderness. She exhibits no edema.       Right shoulder: Normal.       Left shoulder: Normal.       Right hip: She exhibits decreased range of motion, tenderness and bony tenderness.       Right knee: She exhibits decreased range of motion. She exhibits no swelling and no deformity. Tenderness found. Medial joint line and lateral joint line tenderness noted.       Legs: Neurological: She is alert and oriented to person, place, and time.  Skin: Skin is warm and dry. No rash noted. No erythema.  Psychiatric: She has a normal mood and affect. Her behavior is normal.  Nursing note and vitals reviewed.    ED Treatments / Results  Labs (all labs ordered are listed, but only abnormal results are displayed) Labs Reviewed  CBC WITH DIFFERENTIAL/PLATELET - Abnormal; Notable for the following components:      Result Value   WBC 14.2 (*)    RBC 3.55 (*)    Hemoglobin 11.4 (*)    HCT 34.2 (*)    RDW 16.4 (*)    Neutro Abs 12.0 (*)    Lymphs Abs 0.6 (*)    Monocytes Absolute 1.6 (*)    All other components within normal limits  PROTIME-INR - Abnormal; Notable for the following components:   Prothrombin Time 21.8 (*)    All other components within normal limits  BASIC METABOLIC PANEL  TYPE AND SCREEN    EKG EKG Interpretation  Date/Time:  Tuesday October 09 2017 09:14:30 EDT Ventricular Rate:  103 PR Interval:    QRS Duration: 98 QT Interval:  365 QTC Calculation: 478 R Axis:   -68 Text Interpretation:  Sinus tachycardia Left anterior fascicular block Abnormal R-wave progression, early transition Borderline T  abnormalities,  anterior leads Confirmed by Blanchie Dessert [phone]) on 10/09/2017 9:17:12 AM   Radiology Dg Chest 1 View  Result Date: 10/09/2017 CLINICAL DATA:  Status post fall.  Right hip pain EXAM: CHEST  1 VIEW COMPARISON:  None. FINDINGS: The heart size and mediastinal contours are within normal limits. Prior CABG. Both lungs are clear. No acute osseous abnormality. Bilateral total reverse shoulder arthroplasties. IMPRESSION: No active disease. Electronically Signed   By: Kathreen Devoid   On: 10/09/2017 10:08   Dg Knee Complete 4 Views Right  Result Date: 10/09/2017 CLINICAL DATA:  Pain following fall EXAM: RIGHT KNEE - COMPLETE 4+ VIEW COMPARISON:  None. FINDINGS: Frontal, lateral common bile oblique views were obtained. The patient is status post total knee replacement with femoral and tibial prosthetic components appearing well-seated. There is no acute fracture or dislocation. No appreciable joint effusion evident. No erosive change. There are foci of calcification in the popliteal artery. IMPRESSION: Status post total knee replacement with prosthetic components well-seated. No fracture or dislocation. No appreciable joint effusion. Popliteal artery atherosclerosis noted. Electronically Signed   By: Lowella Grip III M.D.   On: 10/09/2017 10:13   Dg Hip Unilat With Pelvis 2-3 Views Right  Result Date: 10/09/2017 CLINICAL DATA:  Pain following fall EXAM: DG HIP (WITH OR WITHOUT PELVIS) 2-3V RIGHT COMPARISON:  None. FINDINGS: Frontal pelvis as well as frontal and lateral right hip images were obtained. No fracture or dislocation is evident. There is mild symmetric narrowing of both hip joints. There are exostoses arising from each lateral iliac crest with pseudoarthrosis in the area of exostosis on the left laterally. IMPRESSION: No evident acute fracture or dislocation. Mild symmetric narrowing both hip joints. Exostoses arising from each iliac crest with pseudoarthrosis in this area on the  left. Electronically Signed   By: Lowella Grip III M.D.   On: 10/09/2017 10:11    Procedures Procedures (including critical care time)  Medications Ordered in ED Medications  0.9 %  sodium chloride infusion (has no administration in time range)  fentaNYL (SUBLIMAZE) injection 50 mcg (has no administration in time range)  ondansetron (ZOFRAN) injection 4 mg (has no administration in time range)     Initial Impression / Assessment and Plan / ED Course  I have reviewed the triage vital signs and the nursing notes.  Pertinent labs & imaging results that were available during my care of the patient were reviewed by me and considered in my medical decision making (see chart for details).     Patient with a fall from her couch last night onto the carpeted floor and unable to get up.  Patient laid on the floor all night long despite her daughter wanting to call EMS.  This morning she was still unable to get up and EMS was contacted.  Patient is having significant pain in her right hip and knee.  She does take Coumadin but denies head injury, loss of consciousness and she denies any chest pain or abdominal pain.  Concern for hip fracture or acute knee injury.  She is neurovascularly intact at this time.  Hip protocol order set initiated as well as knee films.  Patient given pain control. EKG shows sinus tachycardia but no other significant acute findings.  11:15 AM Patient's labs are significant for subtherapeutic INR of 1.9, mild leukocytosis of 14 with stable hemoglobin and BMP without acute findings.  Patient's x-ray without acute fracture.  However patient's daughter is now here and reports that patient over the last  2 days has had worsening right knee pain which was the cause of her fall today.  Patient states the knee pain is excruciating and was not improved by fentanyl.  She was given her home medications and is still having significant pain with difficulty bending the knee.  The knee is  mildly swollen but not erythematous.  It seems to be about the same temperature as the other knee.  However she states that she has never had knee pain this bad before.  Low suspicion for occult fracture as patient's symptoms of knee pain started before the fall.  Suspect most likely gout however given that she has heart disease and is on Coumadin for a valve replacement is not a candidate for NSAIDs.  Will give a dose of prednisone however will need to be admitted as she is not safe for home and is unable to stand or bear weight.  Final Clinical Impressions(s) / ED Diagnoses   Final diagnoses:  Acute pain of right knee  Fall, initial encounter  Immobility  Subtherapeutic international normalized ratio (INR)    ED Discharge Orders    None       Blanchie Dessert, MD 10/09/17 2024

## 2017-10-09 NOTE — ED Triage Notes (Signed)
Home, lives with daughter Fall 8/19, off of couch Hit her L side on coffee table, bruising Pain in R knee, unable to get up, called EMS this AM No LOC, blood thinner, did not hit head

## 2017-10-09 NOTE — ED Notes (Signed)
Bed: WA07 Expected date:  Expected time:  Means of arrival:  Comments: EMS-fall 

## 2017-10-10 ENCOUNTER — Other Ambulatory Visit: Payer: Self-pay

## 2017-10-10 ENCOUNTER — Observation Stay (HOSPITAL_COMMUNITY): Payer: Medicare Other

## 2017-10-10 DIAGNOSIS — E7849 Other hyperlipidemia: Secondary | ICD-10-CM | POA: Diagnosis not present

## 2017-10-10 DIAGNOSIS — Y831 Surgical operation with implant of artificial internal device as the cause of abnormal reaction of the patient, or of later complication, without mention of misadventure at the time of the procedure: Secondary | ICD-10-CM | POA: Diagnosis present

## 2017-10-10 DIAGNOSIS — Z5181 Encounter for therapeutic drug level monitoring: Secondary | ICD-10-CM | POA: Diagnosis not present

## 2017-10-10 DIAGNOSIS — G8929 Other chronic pain: Secondary | ICD-10-CM | POA: Diagnosis present

## 2017-10-10 DIAGNOSIS — R7881 Bacteremia: Secondary | ICD-10-CM | POA: Diagnosis present

## 2017-10-10 DIAGNOSIS — I2510 Atherosclerotic heart disease of native coronary artery without angina pectoris: Secondary | ICD-10-CM | POA: Diagnosis present

## 2017-10-10 DIAGNOSIS — T8453XA Infection and inflammatory reaction due to internal right knee prosthesis, initial encounter: Secondary | ICD-10-CM | POA: Diagnosis present

## 2017-10-10 DIAGNOSIS — M6282 Rhabdomyolysis: Secondary | ICD-10-CM | POA: Diagnosis present

## 2017-10-10 DIAGNOSIS — Z951 Presence of aortocoronary bypass graft: Secondary | ICD-10-CM | POA: Diagnosis not present

## 2017-10-10 DIAGNOSIS — W19XXXA Unspecified fall, initial encounter: Secondary | ICD-10-CM | POA: Diagnosis not present

## 2017-10-10 DIAGNOSIS — Z6828 Body mass index (BMI) 28.0-28.9, adult: Secondary | ICD-10-CM | POA: Diagnosis not present

## 2017-10-10 DIAGNOSIS — M25561 Pain in right knee: Secondary | ICD-10-CM | POA: Diagnosis not present

## 2017-10-10 DIAGNOSIS — R932 Abnormal findings on diagnostic imaging of liver and biliary tract: Secondary | ICD-10-CM | POA: Diagnosis not present

## 2017-10-10 DIAGNOSIS — K5900 Constipation, unspecified: Secondary | ICD-10-CM | POA: Diagnosis present

## 2017-10-10 DIAGNOSIS — K828 Other specified diseases of gallbladder: Secondary | ICD-10-CM | POA: Diagnosis not present

## 2017-10-10 DIAGNOSIS — K7460 Unspecified cirrhosis of liver: Secondary | ICD-10-CM | POA: Diagnosis present

## 2017-10-10 DIAGNOSIS — M2550 Pain in unspecified joint: Secondary | ICD-10-CM | POA: Diagnosis not present

## 2017-10-10 DIAGNOSIS — E034 Atrophy of thyroid (acquired): Secondary | ICD-10-CM | POA: Diagnosis not present

## 2017-10-10 DIAGNOSIS — Z952 Presence of prosthetic heart valve: Secondary | ICD-10-CM | POA: Diagnosis not present

## 2017-10-10 DIAGNOSIS — R188 Other ascites: Secondary | ICD-10-CM | POA: Diagnosis present

## 2017-10-10 DIAGNOSIS — M7989 Other specified soft tissue disorders: Secondary | ICD-10-CM | POA: Diagnosis not present

## 2017-10-10 DIAGNOSIS — R791 Abnormal coagulation profile: Secondary | ICD-10-CM | POA: Diagnosis not present

## 2017-10-10 DIAGNOSIS — R2681 Unsteadiness on feet: Secondary | ICD-10-CM | POA: Diagnosis not present

## 2017-10-10 DIAGNOSIS — K8689 Other specified diseases of pancreas: Secondary | ICD-10-CM | POA: Diagnosis present

## 2017-10-10 DIAGNOSIS — R0602 Shortness of breath: Secondary | ICD-10-CM | POA: Diagnosis not present

## 2017-10-10 DIAGNOSIS — R278 Other lack of coordination: Secondary | ICD-10-CM | POA: Diagnosis not present

## 2017-10-10 DIAGNOSIS — Z7901 Long term (current) use of anticoagulants: Secondary | ICD-10-CM | POA: Diagnosis not present

## 2017-10-10 DIAGNOSIS — T796XXA Traumatic ischemia of muscle, initial encounter: Secondary | ICD-10-CM | POA: Diagnosis not present

## 2017-10-10 DIAGNOSIS — R531 Weakness: Secondary | ICD-10-CM | POA: Diagnosis not present

## 2017-10-10 DIAGNOSIS — E8770 Fluid overload, unspecified: Secondary | ICD-10-CM | POA: Diagnosis not present

## 2017-10-10 DIAGNOSIS — E785 Hyperlipidemia, unspecified: Secondary | ICD-10-CM | POA: Diagnosis present

## 2017-10-10 DIAGNOSIS — D72819 Decreased white blood cell count, unspecified: Secondary | ICD-10-CM | POA: Diagnosis not present

## 2017-10-10 DIAGNOSIS — R749 Abnormal serum enzyme level, unspecified: Secondary | ICD-10-CM | POA: Diagnosis not present

## 2017-10-10 DIAGNOSIS — Z4789 Encounter for other orthopedic aftercare: Secondary | ICD-10-CM | POA: Diagnosis not present

## 2017-10-10 DIAGNOSIS — Z471 Aftercare following joint replacement surgery: Secondary | ICD-10-CM | POA: Diagnosis not present

## 2017-10-10 DIAGNOSIS — Z96651 Presence of right artificial knee joint: Secondary | ICD-10-CM | POA: Diagnosis not present

## 2017-10-10 DIAGNOSIS — Z7401 Bed confinement status: Secondary | ICD-10-CM | POA: Diagnosis not present

## 2017-10-10 DIAGNOSIS — I70209 Unspecified atherosclerosis of native arteries of extremities, unspecified extremity: Secondary | ICD-10-CM | POA: Diagnosis present

## 2017-10-10 DIAGNOSIS — D72829 Elevated white blood cell count, unspecified: Secondary | ICD-10-CM | POA: Diagnosis not present

## 2017-10-10 DIAGNOSIS — S8991XA Unspecified injury of right lower leg, initial encounter: Secondary | ICD-10-CM | POA: Diagnosis not present

## 2017-10-10 DIAGNOSIS — M25551 Pain in right hip: Secondary | ICD-10-CM | POA: Diagnosis not present

## 2017-10-10 DIAGNOSIS — E663 Overweight: Secondary | ICD-10-CM | POA: Diagnosis present

## 2017-10-10 DIAGNOSIS — E039 Hypothyroidism, unspecified: Secondary | ICD-10-CM | POA: Diagnosis present

## 2017-10-10 DIAGNOSIS — D62 Acute posthemorrhagic anemia: Secondary | ICD-10-CM | POA: Diagnosis present

## 2017-10-10 DIAGNOSIS — T8450XA Infection and inflammatory reaction due to unspecified internal joint prosthesis, initial encounter: Secondary | ICD-10-CM | POA: Diagnosis not present

## 2017-10-10 DIAGNOSIS — R42 Dizziness and giddiness: Secondary | ICD-10-CM | POA: Diagnosis not present

## 2017-10-10 DIAGNOSIS — W19XXXD Unspecified fall, subsequent encounter: Secondary | ICD-10-CM | POA: Diagnosis not present

## 2017-10-10 DIAGNOSIS — T8453XD Infection and inflammatory reaction due to internal right knee prosthesis, subsequent encounter: Secondary | ICD-10-CM | POA: Diagnosis not present

## 2017-10-10 DIAGNOSIS — F329 Major depressive disorder, single episode, unspecified: Secondary | ICD-10-CM | POA: Diagnosis present

## 2017-10-10 DIAGNOSIS — K838 Other specified diseases of biliary tract: Secondary | ICD-10-CM | POA: Diagnosis present

## 2017-10-10 DIAGNOSIS — I340 Nonrheumatic mitral (valve) insufficiency: Secondary | ICD-10-CM | POA: Diagnosis not present

## 2017-10-10 DIAGNOSIS — E876 Hypokalemia: Secondary | ICD-10-CM | POA: Diagnosis not present

## 2017-10-10 DIAGNOSIS — E8809 Other disorders of plasma-protein metabolism, not elsewhere classified: Secondary | ICD-10-CM | POA: Diagnosis not present

## 2017-10-10 DIAGNOSIS — D649 Anemia, unspecified: Secondary | ICD-10-CM | POA: Diagnosis not present

## 2017-10-10 DIAGNOSIS — R17 Unspecified jaundice: Secondary | ICD-10-CM | POA: Diagnosis not present

## 2017-10-10 DIAGNOSIS — M009 Pyogenic arthritis, unspecified: Secondary | ICD-10-CM | POA: Diagnosis not present

## 2017-10-10 DIAGNOSIS — B9561 Methicillin susceptible Staphylococcus aureus infection as the cause of diseases classified elsewhere: Secondary | ICD-10-CM | POA: Diagnosis present

## 2017-10-10 DIAGNOSIS — E46 Unspecified protein-calorie malnutrition: Secondary | ICD-10-CM | POA: Diagnosis present

## 2017-10-10 DIAGNOSIS — I10 Essential (primary) hypertension: Secondary | ICD-10-CM | POA: Diagnosis present

## 2017-10-10 LAB — COMPREHENSIVE METABOLIC PANEL
ALT: 29 U/L (ref 0–44)
ANION GAP: 5 (ref 5–15)
AST: 73 U/L — ABNORMAL HIGH (ref 15–41)
Albumin: 2.3 g/dL — ABNORMAL LOW (ref 3.5–5.0)
Alkaline Phosphatase: 70 U/L (ref 38–126)
BILIRUBIN TOTAL: 2.1 mg/dL — AB (ref 0.3–1.2)
BUN: 25 mg/dL — ABNORMAL HIGH (ref 8–23)
CALCIUM: 8.6 mg/dL — AB (ref 8.9–10.3)
CO2: 26 mmol/L (ref 22–32)
Chloride: 108 mmol/L (ref 98–111)
Creatinine, Ser: 0.77 mg/dL (ref 0.44–1.00)
GFR calc Af Amer: >60 mL/min (ref >60)
Glucose, Bld: 99 mg/dL (ref 70–99)
POTASSIUM: 4.1 mmol/L (ref 3.5–5.1)
Sodium: 139 mmol/L (ref 135–145)
TOTAL PROTEIN: 6.2 g/dL — AB (ref 6.5–8.1)

## 2017-10-10 LAB — BLOOD CULTURE ID PANEL (REFLEXED)
ACINETOBACTER BAUMANNII: NOT DETECTED
CANDIDA KRUSEI: NOT DETECTED
CANDIDA PARAPSILOSIS: NOT DETECTED
CANDIDA TROPICALIS: NOT DETECTED
Candida albicans: NOT DETECTED
Candida glabrata: NOT DETECTED
ESCHERICHIA COLI: NOT DETECTED
Enterobacter cloacae complex: NOT DETECTED
Enterobacteriaceae species: NOT DETECTED
Enterococcus species: NOT DETECTED
HAEMOPHILUS INFLUENZAE: NOT DETECTED
KLEBSIELLA OXYTOCA: NOT DETECTED
Klebsiella pneumoniae: NOT DETECTED
Listeria monocytogenes: NOT DETECTED
METHICILLIN RESISTANCE: NOT DETECTED
Neisseria meningitidis: NOT DETECTED
PROTEUS SPECIES: NOT DETECTED
PSEUDOMONAS AERUGINOSA: NOT DETECTED
SERRATIA MARCESCENS: NOT DETECTED
STAPHYLOCOCCUS AUREUS BCID: DETECTED — AB
STAPHYLOCOCCUS SPECIES: DETECTED — AB
Streptococcus agalactiae: NOT DETECTED
Streptococcus pneumoniae: NOT DETECTED
Streptococcus pyogenes: NOT DETECTED
Streptococcus species: NOT DETECTED

## 2017-10-10 LAB — CBC
HEMATOCRIT: 31.7 % — AB (ref 36.0–46.0)
HEMOGLOBIN: 10.5 g/dL — AB (ref 12.0–15.0)
MCH: 31.9 pg (ref 26.0–34.0)
MCHC: 33.1 g/dL (ref 30.0–36.0)
MCV: 96.4 fL (ref 78.0–100.0)
Platelets: 140 10*3/uL — ABNORMAL LOW (ref 150–400)
RBC: 3.29 MIL/uL — ABNORMAL LOW (ref 3.87–5.11)
RDW: 16.9 % — AB (ref 11.5–15.5)
WBC: 8.4 10*3/uL (ref 4.0–10.5)

## 2017-10-10 LAB — URINALYSIS, COMPLETE (UACMP) WITH MICROSCOPIC
Bacteria, UA: NONE SEEN
Bilirubin Urine: NEGATIVE
GLUCOSE, UA: NEGATIVE mg/dL
Ketones, ur: NEGATIVE mg/dL
Leukocytes, UA: NEGATIVE
NITRITE: NEGATIVE
PROTEIN: NEGATIVE mg/dL
SPECIFIC GRAVITY, URINE: 1.027 (ref 1.005–1.030)
pH: 5 (ref 5.0–8.0)

## 2017-10-10 LAB — PROTIME-INR
INR: 1.88
PROTHROMBIN TIME: 21.4 s — AB (ref 11.4–15.2)

## 2017-10-10 LAB — GLUCOSE, CAPILLARY: Glucose-Capillary: 81 mg/dL (ref 70–99)

## 2017-10-10 LAB — APTT: aPTT: 52 seconds — ABNORMAL HIGH (ref 24–36)

## 2017-10-10 MED ORDER — CEFAZOLIN SODIUM-DEXTROSE 2-4 GM/100ML-% IV SOLN
2.0000 g | Freq: Three times a day (TID) | INTRAVENOUS | Status: DC
  Administered 2017-10-10 – 2017-10-21 (×34): 2 g via INTRAVENOUS
  Filled 2017-10-10 (×39): qty 100

## 2017-10-10 MED ORDER — HEPARIN (PORCINE) IN NACL 100-0.45 UNIT/ML-% IJ SOLN
950.0000 [IU]/h | INTRAMUSCULAR | Status: DC
  Administered 2017-10-10: 950 [IU]/h via INTRAVENOUS
  Filled 2017-10-10: qty 250

## 2017-10-10 MED ORDER — WARFARIN SODIUM 5 MG PO TABS: 5.0000 mg | ORAL_TABLET | Freq: Once | ORAL | Status: DC

## 2017-10-10 MED ORDER — LIDOCAINE HCL 0.5 % IJ SOLN
10.0000 mL | Freq: Once | INTRAMUSCULAR | Status: DC
  Filled 2017-10-10: qty 10

## 2017-10-10 MED ORDER — SODIUM CHLORIDE 0.9 % IV SOLN
300.0000 mg | Freq: Two times a day (BID) | INTRAVENOUS | Status: DC
  Administered 2017-10-11 – 2017-10-16 (×11): 300 mg via INTRAVENOUS
  Filled 2017-10-10 (×13): qty 300

## 2017-10-10 NOTE — Progress Notes (Signed)
[facility] Infusion Coordinator will follow pt with ID team to support pharmacy for home IV ABX if needed at DC.   If patient discharges after hours, please call [phone].   Larry Sierras 10/10/2017, 5:46 PM

## 2017-10-10 NOTE — Progress Notes (Addendum)
ANTICOAGULATION CONSULT NOTE - [facility] for warfarin -> heparin Indication: mechanical valve  Allergies  Allergen Reactions  . Hydromorphone Hcl Itching    "Dilaudid"  . Meperidine Hcl Itching  . Morphine Sulfate Itching  . Percocet [Oxycodone-Acetaminophen] Itching    Patient Measurements: Height: 5\' 2"  (157.5 cm) Weight: 137 lb 12.8 oz (62.5 kg) IBW/kg (Calculated) : 50.1 Heparin Dosing Weight:   Vital Signs: Temp: 99.5 F (37.5 C) (08/21 1334) Temp Source: Oral (08/21 1334) BP: 118/66 (08/21 1334) Pulse Rate: 96 (08/21 1334)  Labs: Recent Labs    10/09/17 0922 10/10/17 0538  HGB 11.4* 10.5*  HCT 34.2* 31.7*  PLT 153 140*  APTT  --  52*  LABPROT 21.8* 21.4*  INR 1.92 1.88  CREATININE 0.79 0.77  CKTOTAL 1,802*  1,793*  --   CKMB 10.5*  --     Estimated Creatinine Clearance: 52 mL/min (by C-G formula based on SCr of 0.77 mg/dL).   Medical History: Past Medical History:  Diagnosis Date  . ANEMIA 09/15/2009  . Blood transfusion 2000's; 11/17/2011   S/P "one of my other shoulder surgeries"; S/P left shoulder replacement  . CORONARY ARTERY DISEASE 08/26/2007   LOV Dr Tamala Julian 1/13 with EKG and clearance on chart  . Depression   . Heart murmur    hx- surgery  . HYPERLIPIDEMIA 02/07/2007  . HYPERTENSION 09/14/2006  . HYPOTHYROIDISM 09/14/2006  . LIVER FUNCTION TESTS, ABNORMAL, HX OF 01/12/2009  . OLECRANON BURSITIS, LEFT 08/26/2007  . OSTEOARTHRITIS 02/07/2007   "thumbs; knees; shoulders; ?back"  . Pneumonia    hx  . PTSD (post-traumatic stress disorder) 1976    Medications:  Scheduled:  . buPROPion  150 mg Oral Daily  . docusate sodium  100 mg Oral BID  . ferrous sulfate  325 mg Oral Daily  . levothyroxine  100 mcg Oral QAC breakfast  . lidocaine  10 mL Intradermal Once  . losartan  100 mg Oral Daily  . predniSONE  60 mg Oral Q breakfast  . verapamil  120 mg Oral QHS    Assessment: Pharmacy is consulted to dose warfarin in  77 yo female with PMH of mechanical aortic vale replacement.   Pt with recent to Coumadin clinic on 8/16 where pt INR was 4.9. Pt was instructed to skip doses for two days then resume med on 8/18. Per med rec records it does not appear restarted med yet.   The home med regimen was changed to Warfarin 2.5 mg PO daily except to take 5 mg  On mondays and Fridays   Today, 10/10/17   Warfarin dc'd and heparin ordered per pharmacy. Ortho consulted, possible surgery.  INR was subtherapeutic this AM.  Hgb 10.5, trending down; plt 140, trending down   No bleeding noted   Goal of Therapy:  INR 2-3 Monitor platelets by anticoagulation protocol: Yes   Plan:   Start heparin infusion at 950units/hr, no bolus.   Check heparin level in 6 hours.  Monitor for signs and symptoms of bleeding  Romeo Rabon, PharmD. Mobile: [phone]. 10/10/2017,2:40 PM.

## 2017-10-10 NOTE — Progress Notes (Signed)
ANTICOAGULATION CONSULT NOTE - [facility] for warfarin Indication: mechanical valve  Allergies  Allergen Reactions  . Hydromorphone Hcl Itching    "Dilaudid"  . Meperidine Hcl Itching  . Morphine Sulfate Itching  . Percocet [Oxycodone-Acetaminophen] Itching    Patient Measurements: Height: 5\' 2"  (157.5 cm) Weight: 137 lb 12.8 oz (62.5 kg) IBW/kg (Calculated) : 50.1 Heparin Dosing Weight:   Vital Signs: Temp: 98.7 F (37.1 C) (08/21 0549) Temp Source: Oral (08/21 0549) BP: 141/65 (08/21 0549) Pulse Rate: 95 (08/21 0549)  Labs: Recent Labs    10/09/17 0922 10/10/17 0538  HGB 11.4* 10.5*  HCT 34.2* 31.7*  PLT 153 140*  APTT  --  52*  LABPROT 21.8* 21.4*  INR 1.92 1.88  CREATININE 0.79 0.77  CKTOTAL 1,802*  1,793*  --   CKMB 10.5*  --     Estimated Creatinine Clearance: 52 mL/min (by C-G formula based on SCr of 0.77 mg/dL).   Medical History: Past Medical History:  Diagnosis Date  . ANEMIA 09/15/2009  . Blood transfusion 2000's; 11/17/2011   S/P "one of my other shoulder surgeries"; S/P left shoulder replacement  . CORONARY ARTERY DISEASE 08/26/2007   LOV Dr Tamala Julian 1/13 with EKG and clearance on chart  . Depression   . Heart murmur    hx- surgery  . HYPERLIPIDEMIA 02/07/2007  . HYPERTENSION 09/14/2006  . HYPOTHYROIDISM 09/14/2006  . LIVER FUNCTION TESTS, ABNORMAL, HX OF 01/12/2009  . OLECRANON BURSITIS, LEFT 08/26/2007  . OSTEOARTHRITIS 02/07/2007   "thumbs; knees; shoulders; ?back"  . Pneumonia    hx  . PTSD (post-traumatic stress disorder) 1976    Medications:  Scheduled:  . buPROPion  150 mg Oral Daily  . docusate sodium  100 mg Oral BID  . ferrous sulfate  325 mg Oral Daily  . levothyroxine  100 mcg Oral QAC breakfast  . losartan  100 mg Oral Daily  . predniSONE  60 mg Oral Q breakfast  . verapamil  120 mg Oral QHS  . Warfarin - Pharmacist Dosing Inpatient   Does not apply q1800    Assessment: Pharmacy is consulted to  dose warfarin in 77 yo female with PMH of mechanical aortic vale replacement.   Pt with recent to Coumadin clinic on 8/16 where pt INR was 4.9. Pt was instructed to skip doses for two days then resume med on 8/18. Per med rec records it does not appear restarted med yet.   The home med regimen was changed to Warfarin 2.5 mg PO daily except to take 5 mg  On mondays and Fridays   Today, 10/10/17   INR subtherapeutic, trending down slightly after 2.5 mg warfarin given  Hgb 10.5, trending down; plt 140, trending down   No bleeding noted   Pt to start heart healthy diet upon admission   Goal of Therapy:  INR 2-3 Monitor platelets by anticoagulation protocol: Yes   Plan:   Warfarin 5 mg PO x1   Monitor for signs and symptoms of bleeding  Daily INR   Simonne Come, PharmD Candidate

## 2017-10-10 NOTE — Consult Note (Signed)
Reason for Consult:  Right knee pain and swelling Referring Physician:   Hospitalist  Paula Ferguson is an 77 y.o. female.  HPI:    Paula Ferguson is a 77 y.o. female who presents with worsened knee pain and a fall.  She has had had bilateral knee pain for at least 6 years and acutely worsened yesterday morning and specifically in her right knee. She described it as a stabbing pain. Knee pain is a 10/10 with no radiation.  She states that because of her fall she lay on the floor for the whole night because her daughter was unable to pick her up and she refused called EMS.  Daughter stated that she is calling EMS and they brought her to the emergency room for further evaluation.  He also complained of lightheadedness, dizziness and weakness that have progressively worsened over the last few months and she has been more fatigued and exhausted. Initial evaluation in the ED showed that she had negative x-rays however she is unable to ambulate and was concerned that she would fall if she even attempted.  TR[facility] was called to admit this patient for her right knee pain and fall as well as mild rhabdomyolysis.  In the ED patient had basic blood work done as well as imaging studies of the hip and chest as well as right knee.  Orthopedics was called for further evaluation recommendations.  Discussion with hospitalist stated that she has bacteremia and discussion was had to aspirate the knee to evaluate for infection.    Past Medical History:  Diagnosis Date  . ANEMIA 09/15/2009  . Blood transfusion 2000's; 11/17/2011   S/P "one of my other shoulder surgeries"; S/P left shoulder replacement  . CORONARY ARTERY DISEASE 08/26/2007   LOV Dr Tamala Julian 1/13 with EKG and clearance on chart  . Depression   . Heart murmur    hx- surgery  . HYPERLIPIDEMIA 02/07/2007  . HYPERTENSION 09/14/2006  . HYPOTHYROIDISM 09/14/2006  . LIVER FUNCTION TESTS, ABNORMAL, HX OF 01/12/2009  . OLECRANON BURSITIS, LEFT 08/26/2007  .  OSTEOARTHRITIS 02/07/2007   "thumbs; knees; shoulders; ?back"  . Pneumonia    hx  . PTSD (post-traumatic stress disorder) 1976    Past Surgical History:  Procedure Laterality Date  . ABDOMINAL HYSTERECTOMY  1981  . APPENDECTOMY  1981  . CARDIAC CATHETERIZATION  2003  . CARDIAC VALVE REPLACEMENT  2003   "aortic"  . CARPAL TUNNEL RELEASE  1990's   bilaterally  . CHOLECYSTECTOMY  1990's  . COLONOSCOPY    . CORONARY ARTERY BYPASS GRAFT  2003   CABG X1 "mammary" w/aortic valve replacement   . DILATION AND CURETTAGE OF UTERUS  1981?  . EXPLORATORY LAPAROTOMY  ~ 1981  . FRACTURE SURGERY    . JOINT REPLACEMENT     right shoulder arthroscopy, left partial knee replacement  . KNEE ARTHROSCOPY  1980's; 2012   left; right  . PARTIAL KNEE ARTHROPLASTY  1990's   left  . REVERSE SHOULDER ARTHROPLASTY  11/17/2011   Procedure: REVERSE SHOULDER ARTHROPLASTY;  Surgeon: Augustin Schooling, MD;  Location: [facility];  Service: Orthopedics;  Laterality: Left;  left reverse shoulder arthroplasty  . SHOULDER ARTHROSCOPY  2000's   right; "3 surgeries";  Marland Kitchen SHOULDER ARTHROSCOPY W/ ROTATOR CUFF REPAIR  1990's  . STERIOD INJECTION  11/17/2011   Procedure: STEROID INJECTION;  Surgeon: Augustin Schooling, MD;  Location: [facility];  Service: Orthopedics;  Laterality: Right;  cortizone injection right thumb  . TONSILLECTOMY AND  ADENOIDECTOMY  ~ 1964  . TOTAL KNEE ARTHROPLASTY  03/21/2011   Procedure: TOTAL KNEE ARTHROPLASTY;  Surgeon: Mauri Pole, MD;  Location: [facility];  Service: Orthopedics;  Laterality: Right;  . TOTAL SHOULDER REPLACEMENT  2012; 11/17/2011   right; left  . TRANSFER / TRANSPLANT ANKLE TENDON SUPERFICIAL / DEEP  1990's   "bunch of surgeries after dr ruptured posterior tibial tendon", right foot  . v43.3     heart valve    Family History  Problem Relation Age of Onset  . Lung disease Mother   . Lung disease Father   . Healthy Sister   . Kidney disease Brother     Social History:  reports that  she has never smoked. She has never used smokeless tobacco. She reports that she does not drink alcohol or use drugs.  Allergies:  Allergies  Allergen Reactions  . Hydromorphone Hcl Itching    "Dilaudid"  . Meperidine Hcl Itching  . Morphine Sulfate Itching  . Percocet [Oxycodone-Acetaminophen] Itching    Results for orders placed or performed during the hospital encounter of 10/09/17 (from the past 48 hour(s))  Basic metabolic panel     Status: None   Collection Time: 10/09/17  9:22 AM  Result Value Ref Range   Sodium 140 135 - 145 mmol/L   Potassium 3.8 3.5 - 5.1 mmol/L   Chloride 105 98 - 111 mmol/L   CO2 25 22 - 32 mmol/L   Glucose, Bld 92 70 - 99 mg/dL   BUN 21 8 - 23 mg/dL   Creatinine, Ser 0.79 0.44 - 1.00 mg/dL   Calcium 9.0 8.9 - 10.3 mg/dL   GFR calc non Af Amer >60 >60 mL/min   GFR calc Af Amer >60 >60 mL/min    Comment: (NOTE) The eGFR has been calculated using the CKD EPI equation. This calculation has not been validated in all clinical situations. eGFR's persistently <60 mL/min signify possible Chronic Kidney Disease.    Anion gap 10 5 - 15    Comment: Performed at [facility], [place] [address], [place], [place] [address]  CBC WITH DIFFERENTIAL     Status: Abnormal   Collection Time: 10/09/17  9:22 AM  Result Value Ref Range   WBC 14.2 (H) 4.0 - 10.5 K/uL   RBC 3.55 (L) 3.87 - 5.11 MIL/uL   Hemoglobin 11.4 (L) 12.0 - 15.0 g/dL   HCT 34.2 (L) 36.0 - 46.0 %   MCV 96.3 78.0 - 100.0 fL   MCH 32.1 26.0 - 34.0 pg   MCHC 33.3 30.0 - 36.0 g/dL   RDW 16.4 (H) 11.5 - 15.5 %   Platelets 153 150 - 400 K/uL   Neutrophils Relative % 85 %   Neutro Abs 12.0 (H) 1.7 - 7.7 K/uL   Lymphocytes Relative 4 %   Lymphs Abs 0.6 (L) 0.7 - 4.0 K/uL   Monocytes Relative 11 %   Monocytes Absolute 1.6 (H) 0.1 - 1.0 K/uL   Eosinophils Relative 0 %   Eosinophils Absolute 0.0 0.0 - 0.7 K/uL   Basophils Relative 0 %   Basophils Absolute 0.0 0.0 - 0.1 K/uL     Comment: Performed at [facility], [place] [address] [place], [place] [address]  Protime-INR     Status: Abnormal   Collection Time: 10/09/17  9:22 AM  Result Value Ref Range   Prothrombin Time 21.8 (H) 11.4 - 15.2 seconds   INR 1.92     Comment: Performed  at [facility], [place] [address] [place], [place] [address]  Type and screen Arnold     Status: None   Collection Time: 10/09/17  9:22 AM  Result Value Ref Range   ABO/RH(D) O POS    Antibody Screen NEG    Sample Expiration      10/12/2017 Performed at [facility], [place] [address], [place], [place] [address]   CK     Status: Abnormal   Collection Time: 10/09/17  9:22 AM  Result Value Ref Range   Total CK 1,793 (H) 38 - 234 U/L    Comment: Performed at [facility], [place] [address] [place], [place] [address]  CK total and CKMB (cardiac)not at [facility]     Status: Abnormal   Collection Time: 10/09/17  9:22 AM  Result Value Ref Range   Total CK 1,802 (H) 38 - 234 U/L   CK, MB 10.5 (H) 0.5 - 5.0 ng/mL   Relative Index 0.6 0.0 - 2.5    Comment: Performed at [place] [address] [place], [place] [address]  Magnesium     Status: Abnormal   Collection Time: 10/09/17  9:22 AM  Result Value Ref Range   Magnesium 1.5 (L) 1.7 - 2.4 mg/dL    Comment: Performed at [facility], [place] [address] [place], [place] [address]  Phosphorus     Status: None   Collection Time: 10/09/17  9:22 AM  Result Value Ref Range   Phosphorus 2.6 2.5 - 4.6 mg/dL    Comment: Performed at [facility], [place] [address] [place], [place] [address]  TSH     Status: None   Collection Time: 10/09/17  9:22 AM  Result Value Ref Range   TSH 2.573 0.350 - 4.500 uIU/mL    Comment: Performed by a 3rd Generation assay with a functional sensitivity of <=0.01 uIU/mL. Performed at [facility], [place] [address] [place], [place] [address]   Culture, blood (routine x 2)     Status: None (Preliminary result)   Collection Time: 10/09/17 12:29 PM  Result Value Ref Range   Specimen Description      BLOOD RIGHT ANTECUBITAL Performed at [facility], [place] [address], [place], [place] [address]    Special Requests      BOTTLES DRAWN AEROBIC AND ANAEROBIC Blood Culture results may not be optimal due to an inadequate volume of blood received in culture bottles Performed at [facility], [place] [address] [place], [place] [address]    Culture  Setup Time      GRAM POSITIVE COCCI AEROBIC BOTTLE ONLY Organism ID to follow    Culture      NO GROWTH < 24 HOURS Performed at [facility], [place] [address] [place], [place] [address]    Report Status PENDING   Culture, blood (routine x 2)     Status: None (Preliminary result)   Collection Time: 10/09/17 12:34 PM  Result Value Ref Range   Specimen Description      BLOOD LEFT ANTECUBITAL Performed at [place] [address] [place], [place] [address]    Special Requests      BOTTLES DRAWN AEROBIC AND ANAEROBIC Blood Culture adequate volume Performed at [place] [address] [place], [place] [address]    Culture  Setup Time      [facility] POSITIVE COCCI  IN CLUSTERS AEROBIC BOTTLE ONLY Organism ID to follow CRITICAL RESULT CALLED TO, READ BACK BY AND VERIFIED WITH: Karel Jarvis PharmD 9:50 10/10/17 ([email or web address])    Culture      NO GROWTH < 24 HOURS Performed at [facility], [place] [address], [place], [place] [address]    Report Status PENDING   Blood Culture ID Panel (Reflexed)     Status: Abnormal   Collection Time: 10/09/17 12:34 PM  Result Value Ref Range   Enterococcus species NOT DETECTED NOT DETECTED   Listeria monocytogenes NOT DETECTED NOT DETECTED   Staphylococcus species DETECTED (A) NOT DETECTED    Comment: CRITICAL RESULT CALLED TO, READ BACK BY AND  VERIFIED WITH: Karel Jarvis PharmD 9:50 10/10/17 ([email or web address])    Staphylococcus aureus DETECTED (A) NOT DETECTED    Comment: Methicillin (oxacillin) susceptible Staphylococcus aureus (MSSA). Preferred therapy is anti staphylococcal beta lactam antibiotic (Cefazolin or Nafcillin), unless clinically contraindicated. CRITICAL RESULT CALLED TO, READ BACK BY AND VERIFIED WITH: Karel Jarvis PharmD 9:50 10/10/17 ([email or web address])    Methicillin resistance NOT DETECTED NOT DETECTED   Streptococcus species NOT DETECTED NOT DETECTED   Streptococcus agalactiae NOT DETECTED NOT DETECTED   Streptococcus pneumoniae NOT DETECTED NOT DETECTED   Streptococcus pyogenes NOT DETECTED NOT DETECTED   Acinetobacter baumannii NOT DETECTED NOT DETECTED   Enterobacteriaceae species NOT DETECTED NOT DETECTED   Enterobacter cloacae complex NOT DETECTED NOT DETECTED   Escherichia coli NOT DETECTED NOT DETECTED   Klebsiella oxytoca NOT DETECTED NOT DETECTED   Klebsiella pneumoniae NOT DETECTED NOT DETECTED   Proteus species NOT DETECTED NOT DETECTED   Serratia marcescens NOT DETECTED NOT DETECTED   Haemophilus influenzae NOT DETECTED NOT DETECTED   Neisseria meningitidis NOT DETECTED NOT DETECTED   Pseudomonas aeruginosa NOT DETECTED NOT DETECTED   Candida albicans NOT DETECTED NOT DETECTED   Candida glabrata NOT DETECTED NOT DETECTED   Candida krusei NOT DETECTED NOT DETECTED   Candida parapsilosis NOT DETECTED NOT DETECTED   Candida tropicalis NOT DETECTED NOT DETECTED    Comment: Performed at [facility], [address], [place], [place] [address]  Urinalysis, Complete w Microscopic     Status: Abnormal   Collection Time: 10/10/17  2:18 AM  Result Value Ref Range   Color, Urine AMBER (A) YELLOW    Comment: BIOCHEMICALS MAY BE AFFECTED BY COLOR   APPearance CLEAR CLEAR   Specific Gravity, Urine 1.027 1.005 - 1.030   pH 5.0 5.0 - 8.0   Glucose, UA NEGATIVE NEGATIVE mg/dL   Hgb urine dipstick SMALL (A) NEGATIVE    Bilirubin Urine NEGATIVE NEGATIVE   Ketones, ur NEGATIVE NEGATIVE mg/dL   Protein, ur NEGATIVE NEGATIVE mg/dL   Nitrite NEGATIVE NEGATIVE   Leukocytes, UA NEGATIVE NEGATIVE   RBC / HPF 0-5 0 - 5 RBC/hpf   WBC, UA 0-5 0 - 5 WBC/hpf   Bacteria, UA NONE SEEN NONE SEEN   Squamous Epithelial / LPF 0-5 0 - 5   Mucus PRESENT     Comment: Performed at [facility], [place] [address] [place], [place] [address]  Comprehensive metabolic panel     Status: Abnormal   Collection Time: 10/10/17  5:38 AM  Result Value Ref Range   Sodium 139 135 - 145 mmol/L   Potassium 4.1 3.5 - 5.1 mmol/L   Chloride 108 98 - 111 mmol/L   CO2 26 22 - 32 mmol/L   Glucose, Bld 99 70 - 99 mg/dL   BUN 25 (  H) 8 - 23 mg/dL   Creatinine, Ser 0.77 0.44 - 1.00 mg/dL   Calcium 8.6 (L) 8.9 - 10.3 mg/dL   Total Protein 6.2 (L) 6.5 - 8.1 g/dL   Albumin 2.3 (L) 3.5 - 5.0 g/dL   AST 73 (H) 15 - 41 U/L   ALT 29 0 - 44 U/L   Alkaline Phosphatase 70 38 - 126 U/L   Total Bilirubin 2.1 (H) 0.3 - 1.2 mg/dL   GFR calc non Af Amer >60 >60 mL/min   GFR calc Af Amer >60 >60 mL/min    Comment: (NOTE) The eGFR has been calculated using the CKD EPI equation. This calculation has not been validated in all clinical situations. eGFR's persistently <60 mL/min signify possible Chronic Kidney Disease.    Anion gap 5 5 - 15    Comment: Performed at [facility], [place] [address] [place], [place] [address]  CBC     Status: Abnormal   Collection Time: 10/10/17  5:38 AM  Result Value Ref Range   WBC 8.4 4.0 - 10.5 K/uL   RBC 3.29 (L) 3.87 - 5.11 MIL/uL   Hemoglobin 10.5 (L) 12.0 - 15.0 g/dL   HCT 31.7 (L) 36.0 - 46.0 %   MCV 96.4 78.0 - 100.0 fL   MCH 31.9 26.0 - 34.0 pg   MCHC 33.1 30.0 - 36.0 g/dL   RDW 16.9 (H) 11.5 - 15.5 %   Platelets 140 (L) 150 - 400 K/uL    Comment: Performed at [facility], [place] [address], [place], [place] [address]  Protime-INR     Status: Abnormal    Collection Time: 10/10/17  5:38 AM  Result Value Ref Range   Prothrombin Time 21.4 (H) 11.4 - 15.2 seconds   INR 1.88     Comment: Performed at [facility], [place] [address], [place], [place] [address]  APTT     Status: Abnormal   Collection Time: 10/10/17  5:38 AM  Result Value Ref Range   aPTT 52 (H) 24 - 36 seconds    Comment:        IF BASELINE aPTT IS ELEVATED, SUGGEST PATIENT RISK ASSESSMENT BE USED TO DETERMINE APPROPRIATE ANTICOAGULANT THERAPY. Performed at [facility], [place] [address] [place], [place] [address]   Glucose, capillary     Status: None   Collection Time: 10/10/17  7:34 AM  Result Value Ref Range   Glucose-Capillary 81 70 - 99 mg/dL    Dg Chest 1 View  Result Date: 10/09/2017 CLINICAL DATA:  Status post fall.  Right hip pain EXAM: CHEST  1 VIEW COMPARISON:  None. FINDINGS: The heart size and mediastinal contours are within normal limits. Prior CABG. Both lungs are clear. No acute osseous abnormality. Bilateral total reverse shoulder arthroplasties. IMPRESSION: No active disease. Electronically Signed   By: Kathreen Devoid   On: 10/09/2017 10:08   Ct Knee Right W Contrast  Result Date: 10/09/2017 CLINICAL DATA:  Status post fall August 2019 from the couch. Right knee pain. Unable to get up. EXAM: CT OF THE RIGHT KNEE WITH CONTRAST TECHNIQUE: Multidetector CT imaging was performed following the standard protocol during bolus administration of intravenous contrast. COMPARISON:  None. FINDINGS: Bones/Joint/Cartilage Right total knee arthroplasty. Thin lucency around the tibial stem component of the arthroplasty measuring less than 1 mm concerning for loosening. No osteolysis. No periarticular fluid collection. No fracture or dislocation. Normal alignment. Large joint effusion with synovial thickening. Ligaments Ligaments are  suboptimally evaluated by CT. Muscles and Tendons Muscles are normal. No muscle atrophy. Intact quadriceps tendon  and patellar tendon. Soft tissue No fluid collection or hematoma.  No soft tissue mass. IMPRESSION: 1. No acute osseous injury of the right knee. Right total knee arthroplasty with a thin lucency around the tibial stem component of the arthroplasty measuring less than 1 mm concerning for loosening. Large right knee joint effusion with synovial thickening. Electronically Signed   By: Kathreen Devoid   On: 10/09/2017 14:28   Dg Knee Complete 4 Views Right  Result Date: 10/09/2017 CLINICAL DATA:  Pain following fall EXAM: RIGHT KNEE - COMPLETE 4+ VIEW COMPARISON:  None. FINDINGS: Frontal, lateral common bile oblique views were obtained. The patient is status post total knee replacement with femoral and tibial prosthetic components appearing well-seated. There is no acute fracture or dislocation. No appreciable joint effusion evident. No erosive change. There are foci of calcification in the popliteal artery. IMPRESSION: Status post total knee replacement with prosthetic components well-seated. No fracture or dislocation. No appreciable joint effusion. Popliteal artery atherosclerosis noted. Electronically Signed   By: Lowella Grip III M.D.   On: 10/09/2017 10:13   Dg Hip Unilat With Pelvis 2-3 Views Right  Result Date: 10/10/2017 CLINICAL DATA:  Chronic right hip pain.  No known injury. EXAM: DG HIP (WITH OR WITHOUT PELVIS) 2-3V RIGHT COMPARISON:  Radiographs 10/09/2017.  Left hip CT 09/18/2009. FINDINGS: The bones are demineralized. No evidence of acute fracture, dislocation or femoral head avascular necrosis. Mild sacroiliac degenerative changes are present bilaterally. Suspected postsurgical changes at the right iliac crest from previous bone donor site. 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 IMPRESSION: No change from yesterday, and no acute osseous findings demonstrated. The bones are demineralized. If further evaluation warranted, MRI typically more  useful in evaluation of atraumatic hip pain. Electronically Signed   By: Richardean Sale M.D.   On: 10/10/2017 11:23   Dg Hip Unilat With Pelvis 2-3 Views Right  Result Date: 10/09/2017 CLINICAL DATA:  Pain following fall EXAM: DG HIP (WITH OR WITHOUT PELVIS) 2-3V RIGHT COMPARISON:  None. FINDINGS: Frontal pelvis as well as frontal and lateral right hip images were obtained. No fracture or dislocation is evident. There is mild symmetric narrowing of both hip joints. There are exostoses arising from each lateral iliac crest with pseudoarthrosis in the area of exostosis on the left laterally. IMPRESSION: No evident acute fracture or dislocation. Mild symmetric narrowing both hip joints. Exostoses arising from each iliac crest with pseudoarthrosis in this area on the left. Electronically Signed   By: Lowella Grip III M.D.   On: 10/09/2017 10:11    Review of Systems  Constitutional: Positive for chills and fever.  HENT: Negative.   Eyes: Negative.   Respiratory: Negative.   Cardiovascular: Negative.   Gastrointestinal: Negative.   Genitourinary: Negative.   Musculoskeletal: Positive for joint pain.  Skin: Negative.   Neurological: Positive for weakness.  Endo/Heme/Allergies: Negative.   Psychiatric/Behavioral: Positive for depression.   Blood pressure (!) 141/65, pulse 95, temperature 98.7 F (37.1 C), temperature source Oral, resp. rate 16, height 5' 2"  (1.575 m), weight 62.5 kg, SpO2 99 %. Physical Exam  Constitutional: She is oriented to person, place, and time. She appears well-developed.  HENT:  Head: Normocephalic.  Eyes: Pupils are equal, round, and reactive to light.  Neck: Neck supple. No JVD present. No tracheal deviation present. No thyromegaly present.  Cardiovascular: Normal rate, regular  rhythm and intact distal pulses.  Respiratory: Effort normal and breath sounds normal. No respiratory distress. She has no wheezes.  GI: Soft. There is no tenderness. There is no  guarding.  Musculoskeletal:       Right hip: She exhibits decreased strength and tenderness. She exhibits normal range of motion, no swelling, no crepitus, no deformity and no laceration.       Right knee: She exhibits decreased range of motion, swelling, effusion and bony tenderness. She exhibits no deformity, no laceration (healed previous incision) and no erythema. Tenderness found.  Lymphadenopathy:    She has no cervical adenopathy.  Neurological: She is alert and oriented to person, place, and time.  Skin: Skin is warm and dry.  Psychiatric: She has a normal mood and affect.    Assessment/Plan: Right knee swelling / pain  Reviewed with hospitalist with regards to the oatient having bactermia Aspiration of the right knee was performed.  The aspiration was cloudy and sent to the lab for analysis Labs ordered were cell count, gram stain and culture Will await the results of the labs and have Dr. Alvan Dame review the results and come up with a plan. I have discussed with the patient that depending on the results, surgical intervention will probably be necessary     [place] 10/10/2017, 1:13 PM

## 2017-10-10 NOTE — Progress Notes (Signed)
PT Cancellation Note  Patient Details Name: Paula Ferguson MRN: [id number] DOB: November 10, 1940   Cancelled Treatment:    Reason Eval/Treat Not Completed: Patient not medically ready, Noted to get bone scan and pelvis films. Check back tomorrow.[facility] PT [phone]    [name] 10/10/2017, 3:17 PM

## 2017-10-10 NOTE — Procedures (Signed)
After consent was obtained the patients' left knee was cleaned with Betadine swab times 3.  The superolateral area was then injected with 6 cc of 0.5% Lidocaine.  Once the Lidocaine was allowed to take effect a 22 gauge needle on a 60 cc syringe was used to aspirate the right knee. 45 cc of cloudy serosanguinous fluid with sediment was obtained.  The area was then cleaned and dressing applied.  The aspirate was brought to the lab for cell count, gram stain and cultures.

## 2017-10-10 NOTE — Progress Notes (Signed)
Contacted Dr. Aurea Graff office per order to request ortho consult; message left - was told that PA would get in contact with Dr. Veverly Fells and one of them would call back. Awaiting callback for orders. Will monitor.

## 2017-10-10 NOTE — Progress Notes (Signed)
PROGRESS NOTE    Paula Ferguson  PYP:[id number] DOB: January 06, 1941 DOA: 10/09/2017 PCP: Marletta Lor, MD   Brief Narrative:  HPI On 10/09/2017 by Dr. Raiford Noble Paula Ferguson is a 77 y.o. female with medical history significant of CAD s/p CABG x1 and Hx of AVR, HTN, HLD, Hypothyroidism, Anemia, Depression and other comorbidites who presents with worsened Knee pain and a fall.  She has had had Knee Pain Bilateral for at least 6 years and acutely worsened yesterday morning and specifically in her Right Knee. She described it as a stabbing pain. Knee pain is a 10/10 No radiation.  She states that because of her fall she lay on the floor for the whole night because her daughter was unable to pick her up and she refused called EMS.  Daughter family stated that she is calling EMS and they brought her to the emergency room for further evaluation.  He also complained of +Lightheadedness, Dizziness, Weakness and progressively gotten worse the last few months and is more fatigued and exhausted. No CP, Abdominal, No N/V/Diarrhe, no Burning Discomfort but has had increased frequency.  Initial evaluation in the ED showed that she had negative x-rays however she is unable to ambulate and was concerned that she would fall if she even attempted.  [facility] was called to admit this patient for her right knee pain and fall as well as mild rhabdomyolysis.  Interim history Admitted for right knee pain and swelling after fall.  Found to have mild rhabdo.  Also noted to have MSSA bacteremia.  Infectious disease consulted.  Ortho consulted.  Cardiology consulted for TEE. Assessment & Plan   MSSA bacteremia with leukocytosis -Initially admitted with WBC of 14.2, was afebrile at that time -?  Unknown source -Blood cultures showed MSSA -Infectious disease consulted and appreciated -Patient placed on Ancef.  Discussed with Dr. Baxter Flattery, ID, would like to add rifampin -Will repeat blood cultures -Given that  patient has had knee replacement and prosthesis, orthopedics consulted and appreciated.  Have asked for possible tap.  Intractable right knee pain with swelling secondary to fall -Orthopedics consulted and appreciated -Right knee x-ray showed no acute fracture or dislocation. -Right hip x-ray showed no evident acute fracture dislocation -CT of the right knee: No acute osseous injury of the right knee.  Right total knee arthroplasty with a thin lucency around the tibial stem component of the arthroplasty measuring less than 1 mm concerning for loosening.  Large right joint effusion with synovial thickening. -Patient was started on prednisone for possible gout flare.  Although she tells me she has no history of gout. -Pending PT/OT  Acute rhabdomyolysis -In the setting of fall and trauma -CK on admission 1738 -Continue IV fluids -Continue to monitor CK level  Dizziness/lightheadedness/generalized weakness -Has been occurring over the last several months and worsening with postural changes -TSH 2.573 -UA unremarkable  Coronary artery disease/aortic valve replacement -Currently chest pain-free -Continue losartan, verapamil -Coumadin now held given that infectious disease would like to start rifampin -Will place patient on heparin -Statin held given rhabdo  Essential hypertension -Continue losartan  Hyperlipidemia -Statin held due to rhabdomyolysis  Depression -Continue bupropion  Normocytic anemia -hemoglobin currently 10.5, baseline appears to be approximately 11-12 -Suspect drop in hemoglobin secondary to delusional component as patient is receiving IV fluids -Continue iron supplementation -Monitor CBC   Hypothyroidism -Continue Synthroid -TSH 2.573  DVT Prophylaxis  Coumadin --> heparin  Code Status: Full  Family Communication: None at bedside  Disposition Plan:  Admitted  Consultants Orthopedics Infectious disease Cardiology  Procedures  None  Antibiotics    Anti-infectives (From admission, onward)   Start     Dose/Rate Route Frequency Ordered Stop   10/10/17 1200  ceFAZolin (ANCEF) IVPB 2g/100 mL premix     2 g 200 mL/hr over 30 Minutes Intravenous Every 8 hours 10/10/17 1111        Subjective:   Paula Ferguson seen and examined today.  Continues to the right knee and hip.  Denies current chest pain, shortness of breath, abdominal pain, nausea or vomiting, diarrhea constipation,  headache.  Objective:   Vitals:   10/09/17 2014 10/10/17 0549 10/10/17 0626 10/10/17 1334  BP: (!) 145/72 (!) 141/65  118/66  Pulse: 98 95  96  Resp: 17 16  16   Temp: 98.7 F (37.1 C) 98.7 F (37.1 C)  99.5 F (37.5 C)  TempSrc: Oral Oral  Oral  SpO2: 99% 99%  94%  Weight:   62.5 kg   Height:        Intake/Output Summary (Last 24 hours) at 10/10/2017 1430 Last data filed at 10/10/2017 1333 Gross per 24 hour  Intake 2015.96 ml  Output 1400 ml  Net 615.96 ml   Filed Weights   10/09/17 0858 10/10/17 0626  Weight: 55.3 kg 62.5 kg    Exam  General: Well developed, well nourished, NAD, appears stated age  107: NCAT, mucous membranes moist.   Neck: Supple  Cardiovascular: S1 S2 auscultated, no rubs, murmurs or gallops. Regular rate and rhythm.  Respiratory: Clear to auscultation bilaterally with equal chest rise  Abdomen: Soft, nontender, nondistended, + bowel sounds  Extremities: warm dry without cyanosis clubbing or edema. Right knee larger, TTP. Right hip TTP.  Neuro: AAOx3, nonfocal  Psych: Normal affect and demeanor with intact judgement and insight   Data Reviewed: I have personally reviewed following labs and imaging studies  CBC: Recent Labs  Lab 10/09/17 0922 10/10/17 0538  WBC 14.2* 8.4  NEUTROABS 12.0*  --   HGB 11.4* 10.5*  HCT 34.2* 31.7*  MCV 96.3 96.4  PLT 153 332*   Basic Metabolic Panel: Recent Labs  Lab 10/09/17 0922 10/10/17 0538  NA 140 139  K 3.8 4.1  CL 105 108  CO2 25 26  GLUCOSE 92 99    BUN 21 25*  CREATININE 0.79 0.77  CALCIUM 9.0 8.6*  MG 1.5*  --   PHOS 2.6  --    GFR: Estimated Creatinine Clearance: 52 mL/min (by C-G formula based on SCr of 0.77 mg/dL). Liver Function Tests: Recent Labs  Lab 10/10/17 0538  AST 73*  ALT 29  ALKPHOS 70  BILITOT 2.1*  PROT 6.2*  ALBUMIN 2.3*   No results for input(s): LIPASE, AMYLASE in the last 168 hours. No results for input(s): AMMONIA in the last 168 hours. Coagulation Profile: Recent Labs  Lab 10/05/17 1406 10/09/17 0922 10/10/17 0538  INR 4.9* 1.92 1.88   Cardiac Enzymes: Recent Labs  Lab 10/09/17 0922  CKTOTAL 1,802*  1,793*  CKMB 10.5*   BNP (last 3 results) No results for input(s): PROBNP in the last 8760 hours. HbA1C: No results for input(s): HGBA1C in the last 72 hours. CBG: Recent Labs  Lab 10/10/17 0734  GLUCAP 81   Lipid Profile: No results for input(s): CHOL, HDL, LDLCALC, TRIG, CHOLHDL, LDLDIRECT in the last 72 hours. Thyroid Function Tests: Recent Labs    10/09/17 0922  TSH 2.573   Anemia Panel: No results for input(s): VITAMINB12,  FOLATE, FERRITIN, TIBC, IRON, RETICCTPCT in the last 72 hours. Urine analysis:    Component Value Date/Time   COLORURINE AMBER (A) 10/10/2017 0218   APPEARANCEUR CLEAR 10/10/2017 0218   LABSPEC 1.027 10/10/2017 0218   PHURINE 5.0 10/10/2017 0218   GLUCOSEU NEGATIVE 10/10/2017 0218   HGBUR SMALL (A) 10/10/2017 0218   HGBUR negative 04/09/2008 1252   BILIRUBINUR NEGATIVE 10/10/2017 0218   BILIRUBINUR n 03/01/2017 1544   KETONESUR NEGATIVE 10/10/2017 0218   PROTEINUR NEGATIVE 10/10/2017 0218   UROBILINOGEN 1.0 03/01/2017 1544   UROBILINOGEN 1.0 03/14/2011 1520   NITRITE NEGATIVE 10/10/2017 0218   LEUKOCYTESUR NEGATIVE 10/10/2017 0218   Sepsis Labs: @LABRCNTIP (procalcitonin:4,lacticidven:4)  ) Recent Results (from the past 240 hour(s))  Culture, blood (routine x 2)     Status: None (Preliminary result)   Collection Time: 10/09/17 12:29 PM   Result Value Ref Range Status   Specimen Description   Final    BLOOD RIGHT ANTECUBITAL Performed at [facility], [place] [address] [place], [place] [address]    Special Requests   Final    BOTTLES DRAWN AEROBIC AND ANAEROBIC Blood Culture results may not be optimal due to an inadequate volume of blood received in culture bottles Performed at [place] [address] [place], [place] [address]    Culture  Setup Time   Final    GRAM POSITIVE COCCI IN BOTH AEROBIC AND ANAEROBIC BOTTLES Organism ID to follow    Culture   Final    NO GROWTH < 24 HOURS Performed at [facility], [place] [address] [place], [place] [address]    Report Status PENDING  Incomplete  Culture, blood (routine x 2)     Status: None (Preliminary result)   Collection Time: 10/09/17 12:34 PM  Result Value Ref Range Status   Specimen Description   Final    BLOOD LEFT ANTECUBITAL Performed at [place] [address], [place], [place] [address]    Special Requests   Final    BOTTLES DRAWN AEROBIC AND ANAEROBIC Blood Culture adequate volume Performed at [place] [address] [place], [place] [address]    Culture  Setup Time   Final    GRAM POSITIVE COCCI IN CLUSTERS IN BOTH AEROBIC AND ANAEROBIC BOTTLES Organism ID to follow CRITICAL RESULT CALLED TO, READ BACK BY AND VERIFIED WITH: Karel Jarvis PharmD 9:50 10/10/17 ([email or web address])    Culture   Final    NO GROWTH < 24 HOURS Performed at [facility], [place] [address] [place], [place] [address]    Report Status PENDING  Incomplete  Blood Culture ID Panel (Reflexed)     Status: Abnormal   Collection Time: 10/09/17 12:34 PM  Result Value Ref Range Status   Enterococcus species NOT DETECTED NOT DETECTED Final   Listeria monocytogenes NOT DETECTED NOT DETECTED Final   Staphylococcus species DETECTED (A) NOT DETECTED Final    Comment: CRITICAL RESULT CALLED TO, READ BACK  BY AND VERIFIED WITH: Karel Jarvis PharmD 9:50 10/10/17 ([email or web address])    Staphylococcus aureus DETECTED (A) NOT DETECTED Final    Comment: Methicillin (oxacillin) susceptible Staphylococcus aureus (MSSA). Preferred therapy is anti staphylococcal beta lactam antibiotic (Cefazolin or Nafcillin), unless clinically contraindicated. CRITICAL RESULT CALLED TO, READ BACK BY AND VERIFIED WITH: Karel Jarvis PharmD 9:50 10/10/17 ([email or web address])    Methicillin resistance NOT DETECTED NOT DETECTED Final   Streptococcus species NOT DETECTED NOT DETECTED Final   Streptococcus agalactiae NOT  DETECTED NOT DETECTED Final   Streptococcus pneumoniae NOT DETECTED NOT DETECTED Final   Streptococcus pyogenes NOT DETECTED NOT DETECTED Final   Acinetobacter baumannii NOT DETECTED NOT DETECTED Final   Enterobacteriaceae species NOT DETECTED NOT DETECTED Final   Enterobacter cloacae complex NOT DETECTED NOT DETECTED Final   Escherichia coli NOT DETECTED NOT DETECTED Final   Klebsiella oxytoca NOT DETECTED NOT DETECTED Final   Klebsiella pneumoniae NOT DETECTED NOT DETECTED Final   Proteus species NOT DETECTED NOT DETECTED Final   Serratia marcescens NOT DETECTED NOT DETECTED Final   Haemophilus influenzae NOT DETECTED NOT DETECTED Final   Neisseria meningitidis NOT DETECTED NOT DETECTED Final   Pseudomonas aeruginosa NOT DETECTED NOT DETECTED Final   Candida albicans NOT DETECTED NOT DETECTED Final   Candida glabrata NOT DETECTED NOT DETECTED Final   Candida krusei NOT DETECTED NOT DETECTED Final   Candida parapsilosis NOT DETECTED NOT DETECTED Final   Candida tropicalis NOT DETECTED NOT DETECTED Final    Comment: Performed at [facility], [place] [address], [place], [place] [address]      Radiology Studies: Dg Chest 1 View  Result Date: 10/09/2017 CLINICAL DATA:  Status post fall.  Right hip pain EXAM: CHEST  1 VIEW COMPARISON:  None. FINDINGS: The heart size and mediastinal contours are within normal limits. Prior CABG.  Both lungs are clear. No acute osseous abnormality. Bilateral total reverse shoulder arthroplasties. IMPRESSION: No active disease. Electronically Signed   By: Kathreen Devoid   On: 10/09/2017 10:08   Ct Knee Right W Contrast  Result Date: 10/09/2017 CLINICAL DATA:  Status post fall August 2019 from the couch. Right knee pain. Unable to get up. EXAM: CT OF THE RIGHT KNEE WITH CONTRAST TECHNIQUE: Multidetector CT imaging was performed following the standard protocol during bolus administration of intravenous contrast. COMPARISON:  None. FINDINGS: Bones/Joint/Cartilage Right total knee arthroplasty. Thin lucency around the tibial stem component of the arthroplasty measuring less than 1 mm concerning for loosening. No osteolysis. No periarticular fluid collection. No fracture or dislocation. Normal alignment. Large joint effusion with synovial thickening. Ligaments Ligaments are suboptimally evaluated by CT. Muscles and Tendons Muscles are normal. No muscle atrophy. Intact quadriceps tendon and patellar tendon. Soft tissue No fluid collection or hematoma.  No soft tissue mass. IMPRESSION: 1. No acute osseous injury of the right knee. Right total knee arthroplasty with a thin lucency around the tibial stem component of the arthroplasty measuring less than 1 mm concerning for loosening. Large right knee joint effusion with synovial thickening. Electronically Signed   By: Kathreen Devoid   On: 10/09/2017 14:28   Dg Knee Complete 4 Views Right  Result Date: 10/09/2017 CLINICAL DATA:  Pain following fall EXAM: RIGHT KNEE - COMPLETE 4+ VIEW COMPARISON:  None. FINDINGS: Frontal, lateral common bile oblique views were obtained. The patient is status post total knee replacement with femoral and tibial prosthetic components appearing well-seated. There is no acute fracture or dislocation. No appreciable joint effusion evident. No erosive change. There are foci of calcification in the popliteal artery. IMPRESSION: Status post  total knee replacement with prosthetic components well-seated. No fracture or dislocation. No appreciable joint effusion. Popliteal artery atherosclerosis noted. Electronically Signed   By: Lowella Grip III M.D.   On: 10/09/2017 10:13   Dg Hip Unilat With Pelvis 2-3 Views Right  Result Date: 10/10/2017 CLINICAL DATA:  Chronic right hip pain.  No known injury. EXAM: DG HIP (WITH OR WITHOUT PELVIS) 2-3V RIGHT COMPARISON:  Radiographs  10/09/2017.  Left hip CT 09/18/2009. FINDINGS: The bones are demineralized. No evidence of acute fracture, dislocation or femoral head avascular necrosis. Mild sacroiliac degenerative changes are present bilaterally. Suspected postsurgical changes at the right iliac crest from previous bone donor site. 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 IMPRESSION: No change from yesterday, and no acute osseous findings demonstrated. The bones are demineralized. If further evaluation warranted, MRI typically more useful in evaluation of atraumatic hip pain. Electronically Signed   By: Richardean Sale M.D.   On: 10/10/2017 11:23   Dg Hip Unilat With Pelvis 2-3 Views Right  Result Date: 10/09/2017 CLINICAL DATA:  Pain following fall EXAM: DG HIP (WITH OR WITHOUT PELVIS) 2-3V RIGHT COMPARISON:  None. FINDINGS: Frontal pelvis as well as frontal and lateral right hip images were obtained. No fracture or dislocation is evident. There is mild symmetric narrowing of both hip joints. There are exostoses arising from each lateral iliac crest with pseudoarthrosis in the area of exostosis on the left laterally. IMPRESSION: No evident acute fracture or dislocation. Mild symmetric narrowing both hip joints. Exostoses arising from each iliac crest with pseudoarthrosis in this area on the left. Electronically Signed   By: Lowella Grip III M.D.   On: 10/09/2017 10:11     Scheduled Meds: . buPROPion  150 mg Oral Daily  . docusate sodium  100 mg  Oral BID  . ferrous sulfate  325 mg Oral Daily  . levothyroxine  100 mcg Oral QAC breakfast  . lidocaine  10 mL Intradermal Once  . losartan  100 mg Oral Daily  . predniSONE  60 mg Oral Q breakfast  . verapamil  120 mg Oral QHS   Continuous Infusions: . sodium chloride Stopped (10/09/17 1020)  .  ceFAZolin (ANCEF) IV 2 g (10/10/17 1305)     LOS: 0 days   Time Spent in minutes   45 minutes  Zephyr Ridley D.O. on 10/10/2017 at 2:30 PM  Between 7am to 7pm - Please see pager noted on [email or web address]  After 7pm go to [email or web address]  And look for the night coverage person covering for me after hours  [facility] Office  [phone]

## 2017-10-10 NOTE — Progress Notes (Signed)
OT Cancellation Note  Patient Details Name: Paula Ferguson MRN: [id number] DOB: 09-16-1940   Cancelled Treatment:    Reason Eval/Treat Not Completed: Medical issues which prohibited therapy; chart reviewed, currently awaiting Ortho evaluation and recommendations. Will follow up for OT eval after ortho recommendations.   Lou Cal, OT Pager [phone] 10/10/2017  Raymondo Band 10/10/2017, 10:03 AM

## 2017-10-10 NOTE — Progress Notes (Signed)
rm Calhoun right Knee Synovial fluid Result: shows Abundunt WBC and Abundunt Positive gram cocci.

## 2017-10-10 NOTE — Plan of Care (Signed)
Pt in bed with complaints of right knee and hip pain. Tender to touch and causes extreme discomfort to turn or reposition. Pt a&ox4 when questioned directly but at times talks without making any sense. Will continue to monitor.

## 2017-10-10 NOTE — Consult Note (Signed)
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 1.027 1.005 - 1.030    pH 5.0 5.0 - 8.0   Glucose, UA NEGATIVE NEGATIVE mg/dL   Hgb urine dipstick SMALL (A) NEGATIVE   Bilirubin Urine NEGATIVE NEGATIVE   Ketones, ur NEGATIVE NEGATIVE mg/dL   Protein, ur NEGATIVE NEGATIVE mg/dL   Nitrite NEGATIVE NEGATIVE   Leukocytes, UA NEGATIVE NEGATIVE   RBC / HPF 0-5 0 - 5 RBC/hpf   WBC, UA 0-5 0 - 5 WBC/hpf   Bacteria, UA NONE SEEN NONE SEEN   Squamous Epithelial / LPF 0-5 0 - 5   Mucus PRESENT     Comment: Performed at [facility], [place] [address], [place], [place] [address]  Comprehensive metabolic panel     Status: Abnormal   Collection Time: 10/10/17  5:38 AM  Result Value Ref Range   Sodium 139 135 - 145 mmol/L   Potassium 4.1 3.5 - 5.1 mmol/L   Chloride 108 98 - 111 mmol/L   CO2 26 22 - 32 mmol/L   Glucose, Bld 99 70 - 99 mg/dL   BUN 25 (H) 8 - 23 mg/dL   Creatinine, Ser 0.77 0.44 - 1.00 mg/dL   Calcium 8.6 (L) 8.9 - 10.3 mg/dL   Total Protein 6.2 (L) 6.5 - 8.1 g/dL   Albumin 2.3 (L) 3.5 - 5.0 g/dL   AST 73 (H) 15 - 41 U/L   ALT 29 0 - 44 U/L   Alkaline Phosphatase 70 38 - 126 U/L   Total Bilirubin 2.1 (H) 0.3 - 1.2 mg/dL   GFR calc non Af Amer >60 >60 mL/min   GFR calc Af Amer >60 >60 mL/min    Comment: (NOTE) The eGFR has been calculated using the CKD EPI equation. This calculation has not been validated in all clinical situations. eGFR's persistently <60 mL/min signify possible Chronic Kidney Disease.    Anion gap 5 5 - 15    Comment: Performed at [facility], [place] [address], [place], [place] [address]  CBC     Status: Abnormal   Collection Time: 10/10/17  5:38 AM  Result Value Ref Range   WBC 8.4 4.0 - 10.5 K/uL   RBC 3.29 (L) 3.87 - 5.11 MIL/uL   Hemoglobin 10.5 (L) 12.0 - 15.0 g/dL   HCT 31.7 (L) 36.0 - 46.0 %   MCV 96.4 78.0 - 100.0 fL   MCH 31.9 26.0 - 34.0 pg   MCHC 33.1 30.0 - 36.0 g/dL   RDW 16.9 (H) 11.5 - 15.5 %   Platelets 140 (L) 150 - 400 K/uL    Comment: Performed at [facility], [place] [address], [place], [place] [address]  Protime-INR     Status: Abnormal   Collection Time: 10/10/17  5:38 AM  Result Value Ref Range   Prothrombin Time 21.4 (H) 11.4 - 15.2 seconds   INR 1.88     Comment: Performed at [facility], [place] [address] [place], [place] [address]  APTT     Status: Abnormal   Collection Time: 10/10/17  5:38 AM  Result Value Ref Range   aPTT 52 (H) 24 - 36 seconds    Comment:        IF BASELINE aPTT IS ELEVATED, SUGGEST PATIENT RISK ASSESSMENT BE USED TO DETERMINE APPROPRIATE ANTICOAGULANT THERAPY. Performed at [facility], [place] [address] [place], [place] [address]   Glucose, capillary     Status: None   Collection Time: 10/10/17  7:34 AM  Result Value Ref Range   Glucose-Capillary 81 70 - 99 mg/dL    MICRO: 8/20 blood cx MSSA IMAGING: Dg Chest 1 View  Result Date: 10/09/2017 CLINICAL DATA:  Status post fall.  Right hip pain EXAM: CHEST  1 VIEW COMPARISON:  None. FINDINGS: The heart size and mediastinal contours are within normal limits. Prior CABG. Both lungs are clear. No acute osseous abnormality. Bilateral total reverse shoulder arthroplasties. IMPRESSION: No active disease. Electronically Signed   By: Kathreen Devoid   On: 10/09/2017 10:08   Ct Knee Right W Contrast  Result Date: 10/09/2017 CLINICAL DATA:  Status post fall August 2019 from the couch. Right knee pain. Unable to get up. EXAM: CT OF THE RIGHT KNEE WITH CONTRAST TECHNIQUE: Multidetector CT imaging was performed following the standard protocol during bolus administration of intravenous contrast. COMPARISON:  None. FINDINGS: Bones/Joint/Cartilage Right total knee arthroplasty. Thin lucency around the tibial stem component of the arthroplasty measuring less than 1 mm concerning for loosening. No osteolysis. No periarticular fluid collection. No fracture or dislocation. Normal alignment. Large joint effusion with synovial  thickening. Ligaments Ligaments are suboptimally evaluated by CT. Muscles and Tendons Muscles are normal. No muscle atrophy. Intact quadriceps tendon and patellar tendon. Soft tissue No fluid collection or hematoma.  No soft tissue mass. IMPRESSION: 1. No acute osseous injury of the right knee. Right total knee arthroplasty with a thin lucency around the tibial stem component of the arthroplasty measuring less than 1 mm concerning for loosening. Large right knee joint effusion with synovial thickening. Electronically Signed   By: Kathreen Devoid   On: 10/09/2017 14:28   Dg Knee Complete 4 Views Right  Result Date: 10/09/2017 CLINICAL DATA:  Pain following fall EXAM: RIGHT KNEE - COMPLETE 4+ VIEW COMPARISON:  None. FINDINGS: Frontal, lateral common bile oblique views were obtained. The patient is status post total knee replacement with femoral and tibial prosthetic components appearing well-seated. There is no acute fracture or dislocation. No appreciable joint effusion evident. No erosive change. There are foci of calcification in the popliteal artery. IMPRESSION:  Status post total knee replacement with prosthetic components well-seated. No fracture or dislocation. No appreciable joint effusion. Popliteal artery atherosclerosis noted. Electronically Signed   By: Lowella Grip III M.D.   On: 10/09/2017 10:13   Dg Hip Unilat With Pelvis 2-3 Views Right  Result Date: 10/10/2017 CLINICAL DATA:  Chronic right hip pain.  No known injury. EXAM: DG HIP (WITH OR WITHOUT PELVIS) 2-3V RIGHT COMPARISON:  Radiographs 10/09/2017.  Left hip CT 09/18/2009. FINDINGS: The bones are demineralized. No evidence of acute fracture, dislocation or femoral head avascular necrosis. Mild sacroiliac degenerative changes are present bilaterally. Suspected postsurgical changes at the right iliac crest from previous bone donor site. 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 IMPRESSION: No  change from yesterday, and no acute osseous findings demonstrated. The bones are demineralized. If further evaluation warranted, MRI typically more useful in evaluation of atraumatic hip pain. Electronically Signed   By: Richardean Sale M.D.   On: 10/10/2017 11:23   Dg Hip Unilat With Pelvis 2-3 Views Right  Result Date: 10/09/2017 CLINICAL DATA:  Pain following fall EXAM: DG HIP (WITH OR WITHOUT PELVIS) 2-3V RIGHT COMPARISON:  None. FINDINGS: Frontal pelvis as well as frontal and lateral right hip images were obtained. No fracture or dislocation is evident. There is mild symmetric narrowing of both hip joints. There are exostoses arising from each lateral iliac crest with pseudoarthrosis in the area of exostosis on the left laterally. IMPRESSION: No evident acute fracture or dislocation. Mild symmetric narrowing both hip joints. Exostoses arising from each iliac crest with pseudoarthrosis in this area on the left. Electronically Signed   By: Lowella Grip III M.D.   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  -recommend to transition her to heparin instead of warfarin for blood thinner so that we may can start rifampin - needs tee - will forward note to let dr gerdhart know patient is hospitalized. Likely will also need to see her TEE - await ortho eval, would like to get arthrocentesis to see if source of infection? Or see if has PJI requiring washout? - continue on cefazolin - will add rifampin 375m IV BID - please repeat blood cx tomorrow to document clearance

## 2017-10-11 ENCOUNTER — Inpatient Hospital Stay (HOSPITAL_COMMUNITY): Payer: Medicare Other

## 2017-10-11 LAB — BASIC METABOLIC PANEL
Anion gap: 8 (ref 5–15)
BUN: 29 mg/dL — AB (ref 8–23)
CO2: 23 mmol/L (ref 22–32)
CREATININE: 1 mg/dL (ref 0.44–1.00)
Calcium: 8.4 mg/dL — ABNORMAL LOW (ref 8.9–10.3)
Chloride: 104 mmol/L (ref 98–111)
GFR, EST NON AFRICAN AMERICAN: 53 mL/min — AB (ref >60)
Glucose, Bld: 94 mg/dL (ref 70–99)
POTASSIUM: 4.1 mmol/L (ref 3.5–5.1)
SODIUM: 135 mmol/L (ref 135–145)

## 2017-10-11 LAB — CBC
HCT: 28.8 % — ABNORMAL LOW (ref 36.0–46.0)
Hemoglobin: 9.7 g/dL — ABNORMAL LOW (ref 12.0–15.0)
MCH: 32.3 pg (ref 26.0–34.0)
MCHC: 33.7 g/dL (ref 30.0–36.0)
MCV: 96 fL (ref 78.0–100.0)
PLATELETS: 110 10*3/uL — AB (ref 150–400)
RBC: 3 MIL/uL — ABNORMAL LOW (ref 3.87–5.11)
RDW: 17 % — AB (ref 11.5–15.5)
WBC: 9.8 10*3/uL (ref 4.0–10.5)

## 2017-10-11 LAB — GLUCOSE, CAPILLARY: Glucose-Capillary: 91 mg/dL (ref 70–99)

## 2017-10-11 LAB — SYNOVIAL CELL COUNT + DIFF, W/ CRYSTALS
Crystals, Fluid: NONE SEEN
WBC, SYNOVIAL: UNDETERMINED /mm3 (ref 0–200)

## 2017-10-11 LAB — HEPARIN LEVEL (UNFRACTIONATED)
HEPARIN UNFRACTIONATED: 0.26 [IU]/mL — AB (ref 0.30–0.70)
HEPARIN UNFRACTIONATED: 0.27 [IU]/mL — AB (ref 0.30–0.70)
Heparin Unfractionated: 0.17 IU/mL — ABNORMAL LOW (ref 0.30–0.70)

## 2017-10-11 LAB — PATHOLOGIST SMEAR REVIEW

## 2017-10-11 LAB — CK: CK TOTAL: 269 U/L — AB (ref 38–234)

## 2017-10-11 MED ORDER — TECHNETIUM TC 99M MEDRONATE IV KIT
21.9000 | PACK | Freq: Once | INTRAVENOUS | Status: AC | PRN
  Administered 2017-10-11: 21.9 via INTRAVENOUS

## 2017-10-11 MED ORDER — HEPARIN BOLUS VIA INFUSION
800.0000 [IU] | Freq: Once | INTRAVENOUS | Status: AC
  Administered 2017-10-11: 800 [IU] via INTRAVENOUS
  Filled 2017-10-11: qty 800

## 2017-10-11 MED ORDER — HEPARIN (PORCINE) IN NACL 100-0.45 UNIT/ML-% IJ SOLN
1400.0000 [IU]/h | INTRAMUSCULAR | Status: DC
  Administered 2017-10-11: 1400 [IU]/h via INTRAVENOUS
  Filled 2017-10-11 (×3): qty 250

## 2017-10-11 MED ORDER — PREDNISONE 20 MG PO TABS
40.0000 mg | ORAL_TABLET | Freq: Every day | ORAL | Status: DC
Start: 2017-10-12 — End: 2017-10-12

## 2017-10-11 MED ORDER — HEPARIN (PORCINE) IN NACL 100-0.45 UNIT/ML-% IJ SOLN
1200.0000 [IU]/h | INTRAMUSCULAR | Status: DC
  Administered 2017-10-11: 1200 [IU]/h via INTRAVENOUS
  Filled 2017-10-11 (×2): qty 250

## 2017-10-11 MED ORDER — HEPARIN (PORCINE) IN NACL 100-0.45 UNIT/ML-% IJ SOLN
1300.0000 [IU]/h | INTRAMUSCULAR | Status: DC
  Administered 2017-10-11: 1300 [IU]/h via INTRAVENOUS
  Filled 2017-10-11 (×3): qty 250

## 2017-10-11 NOTE — Progress Notes (Signed)
Patient is scheduled for a TEE at [facility] @ 1500 on Friday 8.23.19. Patient will need to arrive via [facility] @ 1300 in Endoscopy. Spoke with Alyse Low ,RN to arrange [facility] transportation.

## 2017-10-11 NOTE — H&P (View-Only) (Signed)
PROGRESS NOTE    Paula Ferguson  VFI:[id number] DOB: Apr 27, 1940 DOA: 10/09/2017 PCP: Marletta Lor, MD   Brief Narrative:  HPI On 10/09/2017 by Dr. Raiford Noble Paula Ferguson is a 77 y.o. female with medical history significant of CAD s/p CABG x1 and Hx of AVR, HTN, HLD, Hypothyroidism, Anemia, Depression and other comorbidites who presents with worsened Knee pain and a fall.  She has had had Knee Pain Bilateral for at least 6 years and acutely worsened yesterday morning and specifically in her Right Knee. She described it as a stabbing pain. Knee pain is a 10/10 No radiation.  She states that because of her fall she lay on the floor for the whole night because her daughter was unable to pick her up and she refused called EMS.  Daughter family stated that she is calling EMS and they brought her to the emergency room for further evaluation.  He also complained of +Lightheadedness, Dizziness, Weakness and progressively gotten worse the last few months and is more fatigued and exhausted. No CP, Abdominal, No N/V/Diarrhe, no Burning Discomfort but has had increased frequency.  Initial evaluation in the ED showed that she had negative x-rays however she is unable to ambulate and was concerned that she would fall if she even attempted.  [facility] was called to admit this patient for her right knee pain and fall as well as mild rhabdomyolysis.  Interim history Admitted for right knee pain and swelling after fall.  Found to have mild rhabdo.  Also noted to have MSSA bacteremia.  Infectious disease consulted.  Ortho consulted.  Cardiology consulted for TEE, to be done on 8/23.   Assessment & Plan   MSSA bacteremia with leukocytosis -Initially admitted with WBC of 14.2, was afebrile at that time -Suspect source is right prosthetic knee -Blood cultures showed MSSA -Infectious disease consulted and appreciated -Continue Ancef and rifampin  -Will repeat blood cultures today -Cardiology  consulted for TEE, planned on 8/23 at 3pm at [facility]  Intractable right knee pain with swelling secondary to fall and infection -Orthopedics consulted and appreciated -Right knee x-ray showed no acute fracture or dislocation. -Right hip x-ray showed no evident acute fracture dislocation -CT of the right knee: No acute osseous injury of the right knee.  Right total knee arthroplasty with a thin lucency around the tibial stem component of the arthroplasty measuring less than 1 mm concerning for loosening.  Large right joint effusion with synovial thickening. -Patient was started on prednisone for possible gout flare.  Although she tells me she has no history of gout. Will wean prednisone. -Pending PT/OT -s/p right knee aspiration, Culture pending. Gram stain shows abundant GPC -pending further recommendations from orthopedics -today, knee swelling appears improved  Acute rhabdomyolysis -In the setting of fall and trauma -CK on admission 1738 -Continue IV fluids -Continue to monitor CK level  Dizziness/lightheadedness/generalized weakness -Has been occurring over the last several months and worsening with postural changes -TSH 2.573 -UA unremarkable  Coronary artery disease/aortic valve replacement -Currently chest pain-free -Continue losartan, verapamil -Coumadin now held given that infectious disease would like to start rifampin -Continue heparin  -Statin held given rhabdo  Essential hypertension -Continue losartan, verapamil  Hyperlipidemia -Statin held due to rhabdomyolysis  Depression -Continue bupropion  Normocytic anemia -hemoglobin currently 9.7, baseline appears to be approximately 11-12 -Suspect drop in hemoglobin secondary to dilutional component as patient is receiving IV fluids -Continue iron supplementation -Monitor CBC   Hypothyroidism -Continue Synthroid -TSH 2.573  DVT  Prophylaxis  Coumadin --> heparin  Code Status: Full  Family Communication:  None at bedside  Disposition Plan: Admitted  Consultants Orthopedics Infectious disease Cardiology  Procedures  None  Antibiotics   Anti-infectives (From admission, onward)   Start     Dose/Rate Route Frequency Ordered Stop   10/11/17 1000  rifampin (RIFADIN) 300 mg in sodium chloride 0.9 % 100 mL IVPB     300 mg 200 mL/hr over 30 Minutes Intravenous Every 12 hours 10/10/17 1603     10/10/17 1200  ceFAZolin (ANCEF) IVPB 2g/100 mL premix     2 g 200 mL/hr over 30 Minutes Intravenous Every 8 hours 10/10/17 1111        Subjective:   Paula Ferguson seen and examined today.  Continues to the right knee and hip.  Denies current chest pain, shortness of breath, abdominal pain, nausea or vomiting, diarrhea constipation,  headache.  Objective:   Vitals:   10/10/17 1334 10/10/17 1334 10/10/17 2053 10/11/17 0637  BP: 118/66 118/66 113/63 (!) 105/57  Pulse: 97 96 97 81  Resp: 16 16 16 16   Temp: 99.5 F (37.5 C) 99.5 F (37.5 C) 99.5 F (37.5 C) 98.8 F (37.1 C)  TempSrc: Oral Oral Oral Oral  SpO2: 94% 94% 94% 94%  Weight:      Height:        Intake/Output Summary (Last 24 hours) at 10/11/2017 1353 Last data filed at 10/11/2017 1005 Gross per 24 hour  Intake 2286.47 ml  Output 750 ml  Net 1536.47 ml   Filed Weights   10/09/17 0858 10/10/17 0626  Weight: 55.3 kg 62.5 kg   Exam  General: Well developed, well nourished, NAD, appears stated age  HEENT: NCAT, mucous membranes moist.   Neck: Supple  Cardiovascular: S1 S2 auscultated, no murmurs, RRR  Respiratory: Clear to auscultation bilaterally with equal chest rise  Abdomen: Soft, nontender, nondistended, + bowel sounds  Extremities: warm dry without cyanosis clubbing or edema. Right knee edema improved. Right hip TTP.  Neuro: AAOx3, nonfocal  Psych: Normal affect and demeanor with intact judgement and insight   Data Reviewed: I have personally reviewed following labs and imaging studies  CBC: Recent  Labs  Lab 10/09/17 0922 10/10/17 0538 10/11/17 0456  WBC 14.2* 8.4 9.8  NEUTROABS 12.0*  --   --   HGB 11.4* 10.5* 9.7*  HCT 34.2* 31.7* 28.8*  MCV 96.3 96.4 96.0  PLT 153 140* 735*   Basic Metabolic Panel: Recent Labs  Lab 10/09/17 0922 10/10/17 0538 10/11/17 0456  NA 140 139 135  K 3.8 4.1 4.1  CL 105 108 104  CO2 25 26 23   GLUCOSE 92 99 94  BUN 21 25* 29*  CREATININE 0.79 0.77 1.00  CALCIUM 9.0 8.6* 8.4*  MG 1.5*  --   --   PHOS 2.6  --   --    GFR: Estimated Creatinine Clearance: 41.6 mL/min (by C-G formula based on SCr of 1 mg/dL). Liver Function Tests: Recent Labs  Lab 10/10/17 0538  AST 73*  ALT 29  ALKPHOS 70  BILITOT 2.1*  PROT 6.2*  ALBUMIN 2.3*   No results for input(s): LIPASE, AMYLASE in the last 168 hours. No results for input(s): AMMONIA in the last 168 hours. Coagulation Profile: Recent Labs  Lab 10/05/17 1406 10/09/17 0922 10/10/17 0538  INR 4.9* 1.92 1.88   Cardiac Enzymes: Recent Labs  Lab 10/09/17 0922  CKTOTAL 1,802*  1,793*  CKMB 10.5*   BNP (last 3  results) No results for input(s): PROBNP in the last 8760 hours. HbA1C: No results for input(s): HGBA1C in the last 72 hours. CBG: Recent Labs  Lab 10/10/17 0734 10/11/17 0806  GLUCAP 81 91   Lipid Profile: No results for input(s): CHOL, HDL, LDLCALC, TRIG, CHOLHDL, LDLDIRECT in the last 72 hours. Thyroid Function Tests: Recent Labs    10/09/17 0922  TSH 2.573   Anemia Panel: No results for input(s): VITAMINB12, FOLATE, FERRITIN, TIBC, IRON, RETICCTPCT in the last 72 hours. Urine analysis:    Component Value Date/Time   COLORURINE AMBER (A) 10/10/2017 0218   APPEARANCEUR CLEAR 10/10/2017 0218   LABSPEC 1.027 10/10/2017 0218   PHURINE 5.0 10/10/2017 0218   GLUCOSEU NEGATIVE 10/10/2017 0218   HGBUR SMALL (A) 10/10/2017 0218   HGBUR negative 04/09/2008 1252   BILIRUBINUR NEGATIVE 10/10/2017 0218   BILIRUBINUR n 03/01/2017 1544   KETONESUR NEGATIVE 10/10/2017  0218   PROTEINUR NEGATIVE 10/10/2017 0218   UROBILINOGEN 1.0 03/01/2017 1544   UROBILINOGEN 1.0 03/14/2011 1520   NITRITE NEGATIVE 10/10/2017 0218   LEUKOCYTESUR NEGATIVE 10/10/2017 0218   Sepsis Labs: @LABRCNTIP (procalcitonin:4,lacticidven:4)  ) Recent Results (from the past 240 hour(s))  Culture, blood (routine x 2)     Status: Abnormal (Preliminary result)   Collection Time: 10/09/17 12:29 PM  Result Value Ref Range Status   Specimen Description   Final    BLOOD RIGHT ANTECUBITAL Performed at [facility], [place] [address], [place], [place] [address]    Special Requests   Final    BOTTLES DRAWN AEROBIC AND ANAEROBIC Blood Culture results may not be optimal due to an inadequate volume of blood received in culture bottles Performed at [facility] [address] [place], [place] [address]    Culture  Setup Time   Final    GRAM POSITIVE COCCI IN BOTH AEROBIC AND ANAEROBIC BOTTLES CRITICAL VALUE NOTED.  VALUE IS CONSISTENT WITH PREVIOUSLY REPORTED AND CALLED VALUE. Performed at [facility], [place] [address], [place], [place] [address]    Culture STAPHYLOCOCCUS AUREUS (A)  Final   Report Status PENDING  Incomplete  Culture, blood (routine x 2)     Status: Abnormal (Preliminary result)   Collection Time: 10/09/17 12:34 PM  Result Value Ref Range Status   Specimen Description   Final    BLOOD LEFT ANTECUBITAL Performed at [facility] [address], [place], [place] [address]    Special Requests   Final    BOTTLES DRAWN AEROBIC AND ANAEROBIC Blood Culture adequate volume Performed at [facility] [address] [place], [place] [address]    Culture  Setup Time   Final    GRAM POSITIVE COCCI IN CLUSTERS IN BOTH AEROBIC AND ANAEROBIC BOTTLES CRITICAL RESULT CALLED TO, READ BACK BY AND VERIFIED WITH: Karel Jarvis PharmD 9:50 10/10/17 ([email or web address])    Culture (A)  Final    STAPHYLOCOCCUS  AUREUS SUSCEPTIBILITIES TO FOLLOW Performed at [facility], [place] [address], [place],  [address]    Report Status PENDING  Incomplete  Blood Culture ID Panel (Reflexed)     Status: Abnormal   Collection Time: 10/09/17 12:34 PM  Result Value Ref Range Status   Enterococcus species NOT DETECTED NOT DETECTED Final   Listeria monocytogenes NOT DETECTED NOT DETECTED Final   Staphylococcus species DETECTED (A) NOT DETECTED Final    Comment: CRITICAL RESULT CALLED TO, READ BACK BY AND VERIFIED WITH: Karel Jarvis PharmD 9:50 10/10/17 ([email or web address])  Staphylococcus aureus DETECTED (A) NOT DETECTED Final    Comment: Methicillin (oxacillin) susceptible Staphylococcus aureus (MSSA). Preferred therapy is anti staphylococcal beta lactam antibiotic (Cefazolin or Nafcillin), unless clinically contraindicated. CRITICAL RESULT CALLED TO, READ BACK BY AND VERIFIED WITH: Karel Jarvis PharmD 9:50 10/10/17 ([email or web address])    Methicillin resistance NOT DETECTED NOT DETECTED Final   Streptococcus species NOT DETECTED NOT DETECTED Final   Streptococcus agalactiae NOT DETECTED NOT DETECTED Final   Streptococcus pneumoniae NOT DETECTED NOT DETECTED Final   Streptococcus pyogenes NOT DETECTED NOT DETECTED Final   Acinetobacter baumannii NOT DETECTED NOT DETECTED Final   Enterobacteriaceae species NOT DETECTED NOT DETECTED Final   Enterobacter cloacae complex NOT DETECTED NOT DETECTED Final   Escherichia coli NOT DETECTED NOT DETECTED Final   Klebsiella oxytoca NOT DETECTED NOT DETECTED Final   Klebsiella pneumoniae NOT DETECTED NOT DETECTED Final   Proteus species NOT DETECTED NOT DETECTED Final   Serratia marcescens NOT DETECTED NOT DETECTED Final   Haemophilus influenzae NOT DETECTED NOT DETECTED Final   Neisseria meningitidis NOT DETECTED NOT DETECTED Final   Pseudomonas aeruginosa NOT DETECTED NOT DETECTED Final   Candida albicans NOT DETECTED NOT DETECTED Final   Candida glabrata NOT DETECTED NOT DETECTED  Final   Candida krusei NOT DETECTED NOT DETECTED Final   Candida parapsilosis NOT DETECTED NOT DETECTED Final   Candida tropicalis NOT DETECTED NOT DETECTED Final    Comment: Performed at [facility], [address] [place], [place] [address]  Aerobic/Anaerobic Culture (surgical/deep wound)     Status: None (Preliminary result)   Collection Time: 10/10/17  5:45 PM  Result Value Ref Range Status   Specimen Description   Final    SYNOVIAL Performed at [facility] [address], [place], [place] [address]    Special Requests   Final    NONE Performed at [facility], [place] [address], [place], [place] [address]    Gram Stain   Final    ABUNDANT WBC PRESENT, PREDOMINANTLY PMN ABUNDANT GRAM POSITIVE COCCI IN PAIRS IN CHAINS IN CLUSTERS Gram Stain Report Called to,Read Back By and Verified With: S.WRIGHT AT 1929 ON 10/10/17 BY N.THOMPSON Performed at [facility], [place] [address], [place], [place] [address]    Culture   Final    TOO YOUNG TO READ Performed at [facility], [place] [address], [place], [place] [address]    Report Status PENDING  Incomplete      Radiology Studies: Ct Knee Right W Contrast  Result Date: 10/09/2017 CLINICAL DATA:  Status post fall August 2019 from the couch. Right knee pain. Unable to get up. EXAM: CT OF THE RIGHT KNEE WITH CONTRAST TECHNIQUE: Multidetector CT imaging was performed following the standard protocol during bolus administration of intravenous contrast. COMPARISON:  None. FINDINGS: Bones/Joint/Cartilage Right total knee arthroplasty. Thin lucency around the tibial stem component of the arthroplasty measuring less than 1 mm concerning for loosening. No osteolysis. No periarticular fluid collection. No fracture or dislocation. Normal alignment. Large joint effusion with synovial thickening. Ligaments Ligaments are suboptimally evaluated by CT. Muscles and Tendons Muscles are normal. No  muscle atrophy. Intact quadriceps tendon and patellar tendon. Soft tissue No fluid collection or hematoma.  No soft tissue mass. IMPRESSION: 1. No acute osseous injury of the right knee. Right total knee arthroplasty with a thin lucency around the tibial stem component of the arthroplasty measuring less than 1 mm concerning for loosening. Large right knee joint effusion  with synovial thickening. Electronically Signed   By: Kathreen Devoid   On: 10/09/2017 14:28   Nm Bone Scan 3 Phase  Result Date: 10/11/2017 CLINICAL DATA:  Mechanical loosening of joint replacement, RIGHT knee pain since knee replacement surgery in 2014, fell Sunday EXAM: NUCLEAR MEDICINE 3-PHASE BONE SCAN TECHNIQUE: Radionuclide angiographic images, immediate static blood pool images, and 3-hour delayed static images were obtained of the knees after intravenous injection of radiopharmaceutical. RADIOPHARMACEUTICALS:  21.9 mCi Tc-40m MDP IV COMPARISON:  04/06/2017 Radiographic correlation: 10/09/2017 radiographs, CT RIGHT knee 10/09/2017 FINDINGS: Vascular phase: Increased blood flow to RIGHT knee. Normal blood flow LEFT knee. Blood pool phase: Increased blood pool diffusely about RIGHT knee versus LEFT, especially at the medial distal femoral metaphysis and slightly at the tibial plateau greater laterally. Delayed phase: Photopenic defect at RIGHT knee from knee prosthesis. Focal increased tracer accumulation at the RIGHT lateral tibial plateau suspicious for aseptic loosening of the prosthesis, though infection not excluded. No other focal abnormal increased tracer uptake adjacent to prosthetic components of the RIGHT knee. On the LEFT, photopenic defect is seen at the medial compartment question unicompartmental prosthesis. Mild degenerative type uptake at the lateral compartment LEFT knee. IMPRESSION: Increased blood flow, blood pool, and delayed uptake of tracer at the RIGHT knee, with delayed uptake most focal at the RIGHT lateral tibial  plateau adjacent to the prosthesis concerning for aseptic loosening or less likely infection of the prosthesis. Degenerative type uptake of tracer at the lateral compartment LEFT knee with evidence of prior medial compartment partial joint replacement. Electronically Signed   By: Lavonia Dana M.D.   On: 10/11/2017 13:16   Dg Hip Unilat With Pelvis 2-3 Views Right  Result Date: 10/10/2017 CLINICAL DATA:  Chronic right hip pain.  No known injury. EXAM: DG HIP (WITH OR WITHOUT PELVIS) 2-3V RIGHT COMPARISON:  Radiographs 10/09/2017.  Left hip CT 09/18/2009. FINDINGS: The bones are demineralized. No evidence of acute fracture, dislocation or femoral head avascular necrosis. Mild sacroiliac degenerative changes are present bilaterally. Suspected postsurgical changes at the right iliac crest from previous bone donor site. 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 IMPRESSION: No change from yesterday, and no acute osseous findings demonstrated. The bones are demineralized. If further evaluation warranted, MRI typically more useful in evaluation of atraumatic hip pain. Electronically Signed   By: Richardean Sale M.D.   On: 10/10/2017 11:23     Scheduled Meds: . buPROPion  150 mg Oral Daily  . docusate sodium  100 mg Oral BID  . ferrous sulfate  325 mg Oral Daily  . levothyroxine  100 mcg Oral QAC breakfast  . lidocaine  10 mL Intradermal Once  . losartan  100 mg Oral Daily  . predniSONE  60 mg Oral Q breakfast  . verapamil  120 mg Oral QHS   Continuous Infusions: . sodium chloride 75 mL/hr at 10/11/17 0946  .  ceFAZolin (ANCEF) IV 2 g (10/11/17 1347)  . heparin 1,300 Units/hr (10/11/17 1319)  . rifampin (RIFADIN) IVPB 300 mg (10/11/17 1005)     LOS: 1 day   Time Spent in minutes   30 minutes  Tavita Eastham D.O. on 10/11/2017 at 1:53 PM  Between 7am to 7pm - Please see pager noted on [email or web address]  After 7pm go to [email or web address]  And look for the  night coverage person covering for me after hours  [facility] Office  [phone]

## 2017-10-11 NOTE — Progress Notes (Signed)
PROGRESS NOTE    Paula Ferguson  BHA:[id number] DOB: 05/08/1940 DOA: 10/09/2017 PCP: Marletta Lor, MD   Brief Narrative:  HPI On 10/09/2017 by Dr. Raiford Noble Paula Ferguson is a 77 y.o. female with medical history significant of CAD s/p CABG x1 and Hx of AVR, HTN, HLD, Hypothyroidism, Anemia, Depression and other comorbidites who presents with worsened Knee pain and a fall.  She has had had Knee Pain Bilateral for at least 6 years and acutely worsened yesterday morning and specifically in her Right Knee. She described it as a stabbing pain. Knee pain is a 10/10 No radiation.  She states that because of her fall she lay on the floor for the whole night because her daughter was unable to pick her up and she refused called EMS.  Daughter family stated that she is calling EMS and they brought her to the emergency room for further evaluation.  He also complained of +Lightheadedness, Dizziness, Weakness and progressively gotten worse the last few months and is more fatigued and exhausted. No CP, Abdominal, No N/V/Diarrhe, no Burning Discomfort but has had increased frequency.  Initial evaluation in the ED showed that she had negative x-rays however she is unable to ambulate and was concerned that she would fall if she even attempted.  TRH was called to admit this patient for her right knee pain and fall as well as mild rhabdomyolysis.  Interim history Admitted for right knee pain and swelling after fall.  Found to have mild rhabdo.  Also noted to have MSSA bacteremia.  Infectious disease consulted.  Ortho consulted.  Cardiology consulted for TEE, to be done on 8/23.   Assessment & Plan   MSSA bacteremia with leukocytosis -Initially admitted with WBC of 14.2, was afebrile at that time -Suspect source is right prosthetic knee -Blood cultures showed MSSA -Infectious disease consulted and appreciated -Continue Ancef and rifampin  -Will repeat blood cultures today -Cardiology  consulted for TEE, planned on 8/23 at 3pm at [facility]  Intractable right knee pain with swelling secondary to fall and infection -Orthopedics consulted and appreciated -Right knee x-ray showed no acute fracture or dislocation. -Right hip x-ray showed no evident acute fracture dislocation -CT of the right knee: No acute osseous injury of the right knee.  Right total knee arthroplasty with a thin lucency around the tibial stem component of the arthroplasty measuring less than 1 mm concerning for loosening.  Large right joint effusion with synovial thickening. -Patient was started on prednisone for possible gout flare.  Although she tells me she has no history of gout. Will wean prednisone. -Pending PT/OT -s/p right knee aspiration, Culture pending. Gram stain shows abundant GPC -pending further recommendations from orthopedics -today, knee swelling appears improved  Acute rhabdomyolysis -In the setting of fall and trauma -CK on admission 1738 -Continue IV fluids -Continue to monitor CK level  Dizziness/lightheadedness/generalized weakness -Has been occurring over the last several months and worsening with postural changes -TSH 2.573 -UA unremarkable  Coronary artery disease/aortic valve replacement -Currently chest pain-free -Continue losartan, verapamil -Coumadin now held given that infectious disease would like to start rifampin -Continue heparin  -Statin held given rhabdo  Essential hypertension -Continue losartan, verapamil  Hyperlipidemia -Statin held due to rhabdomyolysis  Depression -Continue bupropion  Normocytic anemia -hemoglobin currently 9.7, baseline appears to be approximately 11-12 -Suspect drop in hemoglobin secondary to dilutional component as patient is receiving IV fluids -Continue iron supplementation -Monitor CBC   Hypothyroidism -Continue Synthroid -TSH 2.573  DVT  Prophylaxis  Coumadin --> heparin  Code Status: Full  Family Communication:  None at bedside  Disposition Plan: Admitted  Consultants Orthopedics Infectious disease Cardiology  Procedures  None  Antibiotics   Anti-infectives (From admission, onward)   Start     Dose/Rate Route Frequency Ordered Stop   10/11/17 1000  rifampin (RIFADIN) 300 mg in sodium chloride 0.9 % 100 mL IVPB     300 mg 200 mL/hr over 30 Minutes Intravenous Every 12 hours 10/10/17 1603     10/10/17 1200  ceFAZolin (ANCEF) IVPB 2g/100 mL premix     2 g 200 mL/hr over 30 Minutes Intravenous Every 8 hours 10/10/17 1111        Subjective:   Paula Ferguson seen and examined today.  Continues to the right knee and hip.  Denies current chest pain, shortness of breath, abdominal pain, nausea or vomiting, diarrhea constipation,  headache.  Objective:   Vitals:   10/10/17 1334 10/10/17 1334 10/10/17 2053 10/11/17 0637  BP: 118/66 118/66 113/63 (!) 105/57  Pulse: 97 96 97 81  Resp: 16 16 16 16   Temp: 99.5 F (37.5 C) 99.5 F (37.5 C) 99.5 F (37.5 C) 98.8 F (37.1 C)  TempSrc: Oral Oral Oral Oral  SpO2: 94% 94% 94% 94%  Weight:      Height:        Intake/Output Summary (Last 24 hours) at 10/11/2017 1353 Last data filed at 10/11/2017 1005 Gross per 24 hour  Intake 2286.47 ml  Output 750 ml  Net 1536.47 ml   Filed Weights   10/09/17 0858 10/10/17 0626  Weight: 55.3 kg 62.5 kg   Exam  General: Well developed, well nourished, NAD, appears stated age  HEENT: NCAT, mucous membranes moist.   Neck: Supple  Cardiovascular: S1 S2 auscultated, no murmurs, RRR  Respiratory: Clear to auscultation bilaterally with equal chest rise  Abdomen: Soft, nontender, nondistended, + bowel sounds  Extremities: warm dry without cyanosis clubbing or edema. Right knee edema improved. Right hip TTP.  Neuro: AAOx3, nonfocal  Psych: Normal affect and demeanor with intact judgement and insight   Data Reviewed: I have personally reviewed following labs and imaging studies  CBC: Recent  Labs  Lab 10/09/17 0922 10/10/17 0538 10/11/17 0456  WBC 14.2* 8.4 9.8  NEUTROABS 12.0*  --   --   HGB 11.4* 10.5* 9.7*  HCT 34.2* 31.7* 28.8*  MCV 96.3 96.4 96.0  PLT 153 140* 341*   Basic Metabolic Panel: Recent Labs  Lab 10/09/17 0922 10/10/17 0538 10/11/17 0456  NA 140 139 135  K 3.8 4.1 4.1  CL 105 108 104  CO2 25 26 23   GLUCOSE 92 99 94  BUN 21 25* 29*  CREATININE 0.79 0.77 1.00  CALCIUM 9.0 8.6* 8.4*  MG 1.5*  --   --   PHOS 2.6  --   --    GFR: Estimated Creatinine Clearance: 41.6 mL/min (by C-G formula based on SCr of 1 mg/dL). Liver Function Tests: Recent Labs  Lab 10/10/17 0538  AST 73*  ALT 29  ALKPHOS 70  BILITOT 2.1*  PROT 6.2*  ALBUMIN 2.3*   No results for input(s): LIPASE, AMYLASE in the last 168 hours. No results for input(s): AMMONIA in the last 168 hours. Coagulation Profile: Recent Labs  Lab 10/05/17 1406 10/09/17 0922 10/10/17 0538  INR 4.9* 1.92 1.88   Cardiac Enzymes: Recent Labs  Lab 10/09/17 0922  CKTOTAL 1,802*  1,793*  CKMB 10.5*   BNP (last 3  results) No results for input(s): PROBNP in the last 8760 hours. HbA1C: No results for input(s): HGBA1C in the last 72 hours. CBG: Recent Labs  Lab 10/10/17 0734 10/11/17 0806  GLUCAP 81 91   Lipid Profile: No results for input(s): CHOL, HDL, LDLCALC, TRIG, CHOLHDL, LDLDIRECT in the last 72 hours. Thyroid Function Tests: Recent Labs    10/09/17 0922  TSH 2.573   Anemia Panel: No results for input(s): VITAMINB12, FOLATE, FERRITIN, TIBC, IRON, RETICCTPCT in the last 72 hours. Urine analysis:    Component Value Date/Time   COLORURINE AMBER (A) 10/10/2017 0218   APPEARANCEUR CLEAR 10/10/2017 0218   LABSPEC 1.027 10/10/2017 0218   PHURINE 5.0 10/10/2017 0218   GLUCOSEU NEGATIVE 10/10/2017 0218   HGBUR SMALL (A) 10/10/2017 0218   HGBUR negative 04/09/2008 1252   BILIRUBINUR NEGATIVE 10/10/2017 0218   BILIRUBINUR n 03/01/2017 1544   KETONESUR NEGATIVE 10/10/2017  0218   PROTEINUR NEGATIVE 10/10/2017 0218   UROBILINOGEN 1.0 03/01/2017 1544   UROBILINOGEN 1.0 03/14/2011 1520   NITRITE NEGATIVE 10/10/2017 0218   LEUKOCYTESUR NEGATIVE 10/10/2017 0218   Sepsis Labs: @LABRCNTIP (procalcitonin:4,lacticidven:4)  ) Recent Results (from the past 240 hour(s))  Culture, blood (routine x 2)     Status: Abnormal (Preliminary result)   Collection Time: 10/09/17 12:29 PM  Result Value Ref Range Status   Specimen Description   Final    BLOOD RIGHT ANTECUBITAL Performed at [facility], [place] [address] [place], [place] [address]    Special Requests   Final    BOTTLES DRAWN AEROBIC AND ANAEROBIC Blood Culture results may not be optimal due to an inadequate volume of blood received in culture bottles Performed at [place] [address], [place], [place] [address]    Culture  Setup Time   Final    GRAM POSITIVE COCCI IN BOTH AEROBIC AND ANAEROBIC BOTTLES CRITICAL VALUE NOTED.  VALUE IS CONSISTENT WITH PREVIOUSLY REPORTED AND CALLED VALUE. Performed at [facility], [place] [address], [place], [place] [address]    Culture STAPHYLOCOCCUS AUREUS (A)  Final   Report Status PENDING  Incomplete  Culture, blood (routine x 2)     Status: Abnormal (Preliminary result)   Collection Time: 10/09/17 12:34 PM  Result Value Ref Range Status   Specimen Description   Final    BLOOD LEFT ANTECUBITAL Performed at [place] [address], [place], [place] [address]    Special Requests   Final    BOTTLES DRAWN AEROBIC AND ANAEROBIC Blood Culture adequate volume Performed at [place] [address] [place], [place] [address]    Culture  Setup Time   Final    GRAM POSITIVE COCCI IN CLUSTERS IN BOTH AEROBIC AND ANAEROBIC BOTTLES CRITICAL RESULT CALLED TO, READ BACK BY AND VERIFIED WITH: Karel Jarvis PharmD 9:50 10/10/17 ([email or web address])    Culture (A)  Final    STAPHYLOCOCCUS  AUREUS SUSCEPTIBILITIES TO FOLLOW Performed at [facility], [place] [address], [place], [place] [address]    Report Status PENDING  Incomplete  Blood Culture ID Panel (Reflexed)     Status: Abnormal   Collection Time: 10/09/17 12:34 PM  Result Value Ref Range Status   Enterococcus species NOT DETECTED NOT DETECTED Final   Listeria monocytogenes NOT DETECTED NOT DETECTED Final   Staphylococcus species DETECTED (A) NOT DETECTED Final    Comment: CRITICAL RESULT CALLED TO, READ BACK BY AND VERIFIED WITH: Karel Jarvis PharmD 9:50 10/10/17 ([email or web address])  Staphylococcus aureus DETECTED (A) NOT DETECTED Final    Comment: Methicillin (oxacillin) susceptible Staphylococcus aureus (MSSA). Preferred therapy is anti staphylococcal beta lactam antibiotic (Cefazolin or Nafcillin), unless clinically contraindicated. CRITICAL RESULT CALLED TO, READ BACK BY AND VERIFIED WITH: Karel Jarvis PharmD 9:50 10/10/17 ([email or web address])    Methicillin resistance NOT DETECTED NOT DETECTED Final   Streptococcus species NOT DETECTED NOT DETECTED Final   Streptococcus agalactiae NOT DETECTED NOT DETECTED Final   Streptococcus pneumoniae NOT DETECTED NOT DETECTED Final   Streptococcus pyogenes NOT DETECTED NOT DETECTED Final   Acinetobacter baumannii NOT DETECTED NOT DETECTED Final   Enterobacteriaceae species NOT DETECTED NOT DETECTED Final   Enterobacter cloacae complex NOT DETECTED NOT DETECTED Final   Escherichia coli NOT DETECTED NOT DETECTED Final   Klebsiella oxytoca NOT DETECTED NOT DETECTED Final   Klebsiella pneumoniae NOT DETECTED NOT DETECTED Final   Proteus species NOT DETECTED NOT DETECTED Final   Serratia marcescens NOT DETECTED NOT DETECTED Final   Haemophilus influenzae NOT DETECTED NOT DETECTED Final   Neisseria meningitidis NOT DETECTED NOT DETECTED Final   Pseudomonas aeruginosa NOT DETECTED NOT DETECTED Final   Candida albicans NOT DETECTED NOT DETECTED Final   Candida glabrata NOT DETECTED NOT DETECTED  Final   Candida krusei NOT DETECTED NOT DETECTED Final   Candida parapsilosis NOT DETECTED NOT DETECTED Final   Candida tropicalis NOT DETECTED NOT DETECTED Final    Comment: Performed at [facility], [address], [place], [place] [address]  Aerobic/Anaerobic Culture (surgical/deep wound)     Status: None (Preliminary result)   Collection Time: 10/10/17  5:45 PM  Result Value Ref Range Status   Specimen Description   Final    SYNOVIAL Performed at [place] [address], [place], [place] [address]    Special Requests   Final    NONE Performed at [facility], [place] [address] [place], [place] [address]    Gram Stain   Final    ABUNDANT WBC PRESENT, PREDOMINANTLY PMN ABUNDANT GRAM POSITIVE COCCI IN PAIRS IN CHAINS IN CLUSTERS Gram Stain Report Called to,Read Back By and Verified With: S.WRIGHT AT 1929 ON 10/10/17 BY N.THOMPSON Performed at [facility], [place] [address], [place],  [address]    Culture   Final    TOO YOUNG TO READ Performed at [facility], [place] [address] [place],  [address]    Report Status PENDING  Incomplete      Radiology Studies: Ct Knee Right W Contrast  Result Date: 10/09/2017 CLINICAL DATA:  Status post fall August 2019 from the couch. Right knee pain. Unable to get up. EXAM: CT OF THE RIGHT KNEE WITH CONTRAST TECHNIQUE: Multidetector CT imaging was performed following the standard protocol during bolus administration of intravenous contrast. COMPARISON:  None. FINDINGS: Bones/Joint/Cartilage Right total knee arthroplasty. Thin lucency around the tibial stem component of the arthroplasty measuring less than 1 mm concerning for loosening. No osteolysis. No periarticular fluid collection. No fracture or dislocation. Normal alignment. Large joint effusion with synovial thickening. Ligaments Ligaments are suboptimally evaluated by CT. Muscles and Tendons Muscles are normal. No  muscle atrophy. Intact quadriceps tendon and patellar tendon. Soft tissue No fluid collection or hematoma.  No soft tissue mass. IMPRESSION: 1. No acute osseous injury of the right knee. Right total knee arthroplasty with a thin lucency around the tibial stem component of the arthroplasty measuring less than 1 mm concerning for loosening. Large right knee joint effusion  with synovial thickening. Electronically Signed   By: Kathreen Devoid   On: 10/09/2017 14:28   Nm Bone Scan 3 Phase  Result Date: 10/11/2017 CLINICAL DATA:  Mechanical loosening of joint replacement, RIGHT knee pain since knee replacement surgery in 2014, fell Sunday EXAM: NUCLEAR MEDICINE 3-PHASE BONE SCAN TECHNIQUE: Radionuclide angiographic images, immediate static blood pool images, and 3-hour delayed static images were obtained of the knees after intravenous injection of radiopharmaceutical. RADIOPHARMACEUTICALS:  21.9 mCi Tc-74m MDP IV COMPARISON:  04/06/2017 Radiographic correlation: 10/09/2017 radiographs, CT RIGHT knee 10/09/2017 FINDINGS: Vascular phase: Increased blood flow to RIGHT knee. Normal blood flow LEFT knee. Blood pool phase: Increased blood pool diffusely about RIGHT knee versus LEFT, especially at the medial distal femoral metaphysis and slightly at the tibial plateau greater laterally. Delayed phase: Photopenic defect at RIGHT knee from knee prosthesis. Focal increased tracer accumulation at the RIGHT lateral tibial plateau suspicious for aseptic loosening of the prosthesis, though infection not excluded. No other focal abnormal increased tracer uptake adjacent to prosthetic components of the RIGHT knee. On the LEFT, photopenic defect is seen at the medial compartment question unicompartmental prosthesis. Mild degenerative type uptake at the lateral compartment LEFT knee. IMPRESSION: Increased blood flow, blood pool, and delayed uptake of tracer at the RIGHT knee, with delayed uptake most focal at the RIGHT lateral tibial  plateau adjacent to the prosthesis concerning for aseptic loosening or less likely infection of the prosthesis. Degenerative type uptake of tracer at the lateral compartment LEFT knee with evidence of prior medial compartment partial joint replacement. Electronically Signed   By: Lavonia Dana M.D.   On: 10/11/2017 13:16   Dg Hip Unilat With Pelvis 2-3 Views Right  Result Date: 10/10/2017 CLINICAL DATA:  Chronic right hip pain.  No known injury. EXAM: DG HIP (WITH OR WITHOUT PELVIS) 2-3V RIGHT COMPARISON:  Radiographs 10/09/2017.  Left hip CT 09/18/2009. FINDINGS: The bones are demineralized. No evidence of acute fracture, dislocation or femoral head avascular necrosis. Mild sacroiliac degenerative changes are present bilaterally. Suspected postsurgical changes at the right iliac crest from previous bone donor site. 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 IMPRESSION: No change from yesterday, and no acute osseous findings demonstrated. The bones are demineralized. If further evaluation warranted, MRI typically more useful in evaluation of atraumatic hip pain. Electronically Signed   By: Richardean Sale M.D.   On: 10/10/2017 11:23     Scheduled Meds: . buPROPion  150 mg Oral Daily  . docusate sodium  100 mg Oral BID  . ferrous sulfate  325 mg Oral Daily  . levothyroxine  100 mcg Oral QAC breakfast  . lidocaine  10 mL Intradermal Once  . losartan  100 mg Oral Daily  . predniSONE  60 mg Oral Q breakfast  . verapamil  120 mg Oral QHS   Continuous Infusions: . sodium chloride 75 mL/hr at 10/11/17 0946  .  ceFAZolin (ANCEF) IV 2 g (10/11/17 1347)  . heparin 1,300 Units/hr (10/11/17 1319)  . rifampin (RIFADIN) IVPB 300 mg (10/11/17 1005)     LOS: 1 day   Time Spent in minutes   30 minutes  Yoshiharu Brassell D.O. on 10/11/2017 at 1:53 PM  Between 7am to 7pm - Please see pager noted on [email or web address]  After 7pm go to [email or web address]  And look for the  night coverage person covering for me after hours  [facility] Office  [phone]

## 2017-10-11 NOTE — Progress Notes (Signed)
    [facility] has been requested to perform a transesophageal echocardiogram on 10/12/2017, Dr Johnsie Cancel, 3:00 PM, for bacteremia.  After careful review of history and examination, the risks and benefits of transesophageal echocardiogram have been explained including risks of esophageal damage, perforation (1:10,000 risk), bleeding, pharyngeal hematoma as well as other potential complications associated with conscious sedation including aspiration, arrhythmia, respiratory failure and death. Alternatives to treatment were discussed, questions were answered. Patient is willing to proceed.   Rosaria Ferries, PA-C 10/11/2017 12:23 PM

## 2017-10-11 NOTE — Progress Notes (Addendum)
ANTICOAGULATION CONSULT NOTE - Follow Up Consult  Pharmacy Consult for Heparin Indication: Aortic mechanical valve   Allergies  Allergen Reactions  . Hydromorphone Hcl Itching    "Dilaudid"  . Meperidine Hcl Itching  . Morphine Sulfate Itching  . Percocet [Oxycodone-Acetaminophen] Itching    Patient Measurements: Height: 5\' 2"  (157.5 cm) Weight: 137 lb 12.8 oz (62.5 kg) IBW/kg (Calculated) : 50.1 Heparin Dosing Weight: 55 kg  Vital Signs: Temp: 98.8 F (37.1 C) (08/22 0637) Temp Source: Oral (08/22 0637) BP: 105/57 (08/22 8295) Pulse Rate: 81 (08/22 0637)  Labs: Recent Labs    10/09/17 6213 10/10/17 0538 10/11/17 0020 10/11/17 0456  HGB 11.4* 10.5*  --  9.7*  HCT 34.2* 31.7*  --  28.8*  PLT 153 140*  --  110*  APTT  --  52*  --   --   LABPROT 21.8* 21.4*  --   --   INR 1.92 1.88  --   --   HEPARINUNFRC  --   --  0.17*  --   CREATININE 0.79 0.77  --  1.00  CKTOTAL 1,802*  1,793*  --   --   --   CKMB 10.5*  --   --   --     Estimated Creatinine Clearance: 41.6 mL/min (by C-G formula based on SCr of 1 mg/dL).   Medications:  Scheduled:  . buPROPion  150 mg Oral Daily  . docusate sodium  100 mg Oral BID  . ferrous sulfate  325 mg Oral Daily  . levothyroxine  100 mcg Oral QAC breakfast  . lidocaine  10 mL Intradermal Once  . losartan  100 mg Oral Daily  . predniSONE  60 mg Oral Q breakfast  . verapamil  120 mg Oral QHS   Infusions:  . sodium chloride 75 mL/hr at 10/11/17 0946  .  ceFAZolin (ANCEF) IV 2 g (10/11/17 0320)  . heparin    . rifampin (RIFADIN) IVPB 300 mg (10/11/17 1005)    Assessment: 77 yo female with PMH of mechanical aortic vale replacement on chronic warfarin anticoagulation.  Admitted on 8/20 s/p fall, rhabdo, and with PMH of prosthetic TKR; found to have MSSA bacteremia and prosthetic joint infection.  Pharmacy was initially consulted to dose warfarin inpatient, but was changed from warfarin to Heparin infusion due to drug-drug  interaction with Rifampin.    Today, 10/11/2017:  Last INR 1.88 (on 8/21) was subtherapeutic, last warfarin dose on 8/20.  Heparin level 0.26 (delayed d/t patient off the floor in IR for bone scan)  CBC: Hgb down to 9.7, Plt decreased to 110  No bleeding or complications reported.  MD notes Hgb decrease, suspects dilutional.   Goal of Therapy:  Heparin level 0.3-0.7 units/ml Monitor platelets by anticoagulation protocol: Yes   Plan:   Increase to heparin IV infusion at 1300 units/hr  Heparin level 8 hours after rate change  Daily heparin level and CBC  Continue to monitor H&H and platelets  Follow up long-term anticoagulation plan.   Gretta Arab PharmD, BCPS Pager [phone] 10/11/2017 11:27 AM    Addendum:  HL = 0.27 remains slightly subtherapeutic on heparin infusion of 1300 units/hr. Will give 800 units bolus and increase heparin infusion to 1350 units/hr.  Per discussion with RN - no interruptions/issues with infusion. No issues with bleeding.   Lenis Noon, PharmD 10/11/17 10:21 PM  Addendum:  Will increase infusion rate to 1400 units/hr instead of 1350 units/hr.   Lenis Noon, PharmD 10/11/17 10:31  PM   

## 2017-10-11 NOTE — Progress Notes (Signed)
OT Cancellation Note  Patient Details Name: Paula Ferguson MRN: [id number] DOB: 1941/01/07   Cancelled Treatment:    Reason Eval/Treat Not Completed: Other (comment).  Awaiting ortho clearance.    [name] 10/11/2017, 7:51 AM  Lesle Chris, OTR/L [phone] 10/11/2017

## 2017-10-11 NOTE — Progress Notes (Signed)
ANTICOAGULATION CONSULT NOTE - [place] for warfarin -> heparin Indication: mechanical valve  Allergies  Allergen Reactions  . Hydromorphone Hcl Itching    "Dilaudid"  . Meperidine Hcl Itching  . Morphine Sulfate Itching  . Percocet [Oxycodone-Acetaminophen] Itching    Patient Measurements: Height: 5\' 2"  (157.5 cm) Weight: 137 lb 12.8 oz (62.5 kg) IBW/kg (Calculated) : 50.1 Heparin Dosing Weight:   Vital Signs: Temp: 99.5 F (37.5 C) (08/21 2053) Temp Source: Oral (08/21 2053) BP: 113/63 (08/21 2053) Pulse Rate: 97 (08/21 2053)  Labs: Recent Labs    10/09/17 2202 10/10/17 0538 10/11/17 0020  HGB 11.4* 10.5*  --   HCT 34.2* 31.7*  --   PLT 153 140*  --   APTT  --  52*  --   LABPROT 21.8* 21.4*  --   INR 1.92 1.88  --   HEPARINUNFRC  --   --  0.17*  CREATININE 0.79 0.77  --   CKTOTAL 1,802*  1,793*  --   --   CKMB 10.5*  --   --     Estimated Creatinine Clearance: 52 mL/min (by C-G formula based on SCr of 0.77 mg/dL).   Medical History: Past Medical History:  Diagnosis Date  . ANEMIA 09/15/2009  . Blood transfusion 2000's; 11/17/2011   S/P "one of my other shoulder surgeries"; S/P left shoulder replacement  . CORONARY ARTERY DISEASE 08/26/2007   LOV Dr Tamala Julian 1/13 with EKG and clearance on chart  . Depression   . Heart murmur    hx- surgery  . HYPERLIPIDEMIA 02/07/2007  . HYPERTENSION 09/14/2006  . HYPOTHYROIDISM 09/14/2006  . LIVER FUNCTION TESTS, ABNORMAL, HX OF 01/12/2009  . OLECRANON BURSITIS, LEFT 08/26/2007  . OSTEOARTHRITIS 02/07/2007   "thumbs; knees; shoulders; ?back"  . Pneumonia    hx  . PTSD (post-traumatic stress disorder) 1976    Medications:  Scheduled:  . buPROPion  150 mg Oral Daily  . docusate sodium  100 mg Oral BID  . ferrous sulfate  325 mg Oral Daily  . levothyroxine  100 mcg Oral QAC breakfast  . lidocaine  10 mL Intradermal Once  . losartan  100 mg Oral Daily  . predniSONE  60 mg Oral Q breakfast   . verapamil  120 mg Oral QHS    Assessment: Pharmacy is consulted to dose warfarin in 77 yo female with PMH of mechanical aortic vale replacement.   Pt with recent to Coumadin clinic on 8/16 where pt INR was 4.9. Pt was instructed to skip doses for two days then resume med on 8/18. Per med rec records it does not appear restarted med yet.   The home med regimen was changed to Warfarin 2.5 mg PO daily except to take 5 mg  On mondays and Fridays   8/21  Warfarin dc'd and heparin ordered per pharmacy. Ortho consulted, possible surgery.  INR was subtherapeutic this AM.  Hgb 10.5, trending down; plt 140, trending down   No bleeding noted  Today, 8/22  0020 HL = .17 below goal, no bleeding or infusion issues per RN.   Goal of Therapy:  INR 2-3 Monitor platelets by anticoagulation protocol: Yes   Plan:   Increase heparin drip to 1200 units/hr  Reheck heparin level in 6 hours.  Monitor for signs and symptoms of bleeding  Lawana Pai R . 10/11/2017,1:12 AM.

## 2017-10-11 NOTE — Progress Notes (Signed)
PT Cancellation Note  Patient Details Name: Paula Ferguson MRN: [id number] DOB: October 20, 1940   Cancelled Treatment:    Reason Eval/Treat Not Completed: Patient not medically ready Awaiting Ortho plan of care.   [name],[name] 10/11/2017, 10:55 AM [name], PT, DPT 10/11/2017 Pager: [phone]

## 2017-10-12 ENCOUNTER — Inpatient Hospital Stay (HOSPITAL_COMMUNITY): Payer: Medicare Other

## 2017-10-12 ENCOUNTER — Encounter (HOSPITAL_COMMUNITY)
Admission: EM | Disposition: A | Discharge status: Skilled Nursing Facility | Payer: Self-pay | Source: Home / Self Care | Attending: Internal Medicine

## 2017-10-12 ENCOUNTER — Encounter (HOSPITAL_COMMUNITY): Payer: Self-pay

## 2017-10-12 ENCOUNTER — Ambulatory Visit (HOSPITAL_COMMUNITY): Admit: 2017-10-12 | Payer: Medicare Other | Admitting: Cardiovascular Disease

## 2017-10-12 DIAGNOSIS — I340 Nonrheumatic mitral (valve) insufficiency: Secondary | ICD-10-CM

## 2017-10-12 HISTORY — PX: TEE WITHOUT CARDIOVERSION: SHX5443

## 2017-10-12 LAB — CBC
HCT: 28.9 % — ABNORMAL LOW (ref 36.0–46.0)
Hemoglobin: 9.8 g/dL — ABNORMAL LOW (ref 12.0–15.0)
MCH: 32 pg (ref 26.0–34.0)
MCHC: 33.9 g/dL (ref 30.0–36.0)
MCV: 94.4 fL (ref 78.0–100.0)
PLATELETS: 147 10*3/uL — AB (ref 150–400)
RBC: 3.06 MIL/uL — ABNORMAL LOW (ref 3.87–5.11)
RDW: 17.1 % — AB (ref 11.5–15.5)
WBC: 9.9 10*3/uL (ref 4.0–10.5)

## 2017-10-12 LAB — CULTURE, BLOOD (ROUTINE X 2): SPECIAL REQUESTS: ADEQUATE

## 2017-10-12 LAB — URINE CULTURE

## 2017-10-12 LAB — BASIC METABOLIC PANEL
Anion gap: 4 — ABNORMAL LOW (ref 5–15)
BUN: 30 mg/dL — AB (ref 8–23)
CALCIUM: 8.3 mg/dL — AB (ref 8.9–10.3)
CO2: 24 mmol/L (ref 22–32)
CREATININE: 0.78 mg/dL (ref 0.44–1.00)
Chloride: 109 mmol/L (ref 98–111)
GFR calc Af Amer: >60 mL/min (ref >60)
GLUCOSE: 96 mg/dL (ref 70–99)
Potassium: 3.5 mmol/L (ref 3.5–5.1)
Sodium: 137 mmol/L (ref 135–145)

## 2017-10-12 LAB — HEPARIN LEVEL (UNFRACTIONATED)
HEPARIN UNFRACTIONATED: 0.37 [IU]/mL (ref 0.30–0.70)
Heparin Unfractionated: 0.42 IU/mL (ref 0.30–0.70)

## 2017-10-12 LAB — GLUCOSE, CAPILLARY: GLUCOSE-CAPILLARY: 76 mg/dL (ref 70–99)

## 2017-10-12 SURGERY — ECHOCARDIOGRAM, TRANSESOPHAGEAL
Anesthesia: Moderate Sedation

## 2017-10-12 MED ORDER — FENTANYL CITRATE (PF) 100 MCG/2ML IJ SOLN
INTRAMUSCULAR | Status: DC | PRN
  Administered 2017-10-12 (×2): 25 ug via INTRAVENOUS

## 2017-10-12 MED ORDER — SODIUM CHLORIDE 0.9 % IV SOLN
INTRAVENOUS | Status: DC | PRN
  Administered 2017-10-12: 250 mL via INTRAVENOUS
  Administered 2017-10-20: 500 mL via INTRAVENOUS

## 2017-10-12 MED ORDER — FENTANYL CITRATE (PF) 100 MCG/2ML IJ SOLN
INTRAMUSCULAR | Status: AC
  Filled 2017-10-12: qty 2

## 2017-10-12 MED ORDER — PREDNISONE 20 MG PO TABS
20.0000 mg | ORAL_TABLET | Freq: Every day | ORAL | Status: DC
  Administered 2017-10-13: 20 mg via ORAL
  Filled 2017-10-12: qty 1

## 2017-10-12 MED ORDER — MIDAZOLAM HCL 10 MG/2ML IJ SOLN
INTRAMUSCULAR | Status: DC | PRN
  Administered 2017-10-12 (×3): 2 mg via INTRAVENOUS
  Administered 2017-10-12: 1 mg via INTRAVENOUS
  Administered 2017-10-12: 2 mg via INTRAVENOUS

## 2017-10-12 MED ORDER — BUTAMBEN-TETRACAINE-BENZOCAINE 2-2-14 % EX AERO
INHALATION_SPRAY | CUTANEOUS | Status: DC | PRN
  Administered 2017-10-12: 2 via TOPICAL

## 2017-10-12 MED ORDER — MIDAZOLAM HCL 5 MG/ML IJ SOLN
INTRAMUSCULAR | Status: AC
  Filled 2017-10-12: qty 2

## 2017-10-12 MED ORDER — SODIUM CHLORIDE 0.9 % IV SOLN: INTRAVENOUS | Status: DC

## 2017-10-12 MED ORDER — DIPHENHYDRAMINE HCL 50 MG/ML IJ SOLN
INTRAMUSCULAR | Status: DC | PRN
  Administered 2017-10-12: 25 mg via INTRAVENOUS

## 2017-10-12 NOTE — Progress Notes (Signed)
OT Cancellation Note  Patient Details Name: Paula Ferguson MRN: [id number] DOB: [birth year]   Cancelled Treatment:    Reason Eval/Treat Not Completed: Patient at procedure or test/ unavailable (pt at TEE at [facility]. Will follow. )  Almon Register [phone] 10/12/2017, 1:08 PM

## 2017-10-12 NOTE — Progress Notes (Signed)
ANTICOAGULATION CONSULT NOTE - Follow Up Consult  Pharmacy Consult for Heparin Indication: Aortic mechanical valve   Allergies  Allergen Reactions  . Hydromorphone Hcl Itching    "Dilaudid"  . Meperidine Hcl Itching  . Morphine Sulfate Itching  . Percocet [Oxycodone-Acetaminophen] Itching    Patient Measurements: Height: 5\' 2"  (157.5 cm) Weight: 138 lb 14.2 oz (63 kg) IBW/kg (Calculated) : 50.1 Heparin Dosing Weight: 55 kg  Vital Signs: Temp: 98.8 F (37.1 C) (08/23 0527) Temp Source: Oral (08/23 0527) BP: 117/63 (08/23 0527) Pulse Rate: 84 (08/23 0527)  Labs: Recent Labs    10/09/17 6063 10/10/17 0538  10/11/17 0456 10/11/17 1107 10/11/17 1432 10/11/17 2119 10/12/17 0619  HGB 11.4* 10.5*  --  9.7*  --   --   --  9.8*  HCT 34.2* 31.7*  --  28.8*  --   --   --  28.9*  PLT 153 140*  --  110*  --   --   --  147*  APTT  --  52*  --   --   --   --   --   --   LABPROT 21.8* 21.4*  --   --   --   --   --   --   INR 1.92 1.88  --   --   --   --   --   --   HEPARINUNFRC  --   --    < >  --  0.26*  --  0.27* 0.42  CREATININE 0.79 0.77  --  1.00  --   --   --   --   CKTOTAL 1,802*  1,793*  --   --   --   --  269*  --   --   CKMB 10.5*  --   --   --   --   --   --   --    < > = values in this interval not displayed.    Estimated Creatinine Clearance: 41.8 mL/min (by C-G formula based on SCr of 1 mg/dL).   Medications:  Scheduled:  . buPROPion  150 mg Oral Daily  . docusate sodium  100 mg Oral BID  . ferrous sulfate  325 mg Oral Daily  . levothyroxine  100 mcg Oral QAC breakfast  . lidocaine  10 mL Intradermal Once  . losartan  100 mg Oral Daily  . predniSONE  40 mg Oral Q breakfast  . verapamil  120 mg Oral QHS   Infusions:  . sodium chloride 75 mL/hr at 10/12/17 0600  .  ceFAZolin (ANCEF) IV Stopped (10/12/17 0435)  . heparin 1,400 Units/hr (10/12/17 0600)  . rifampin (RIFADIN) IVPB Stopped (10/11/17 2159)    Assessment: 77 yo female with PMH of  mechanical aortic vale replacement on chronic warfarin anticoagulation.  Admitted on 8/20 s/p fall with rhabdo, and with PMH of prosthetic TKR; found to have MSSA bacteremia and prosthetic joint infection.  Pharmacy was initially consulted to dose warfarin inpatient, but was changed from warfarin to Heparin infusion due to drug-drug interaction with Rifampin.    Last INR 1.88 (on 8/21) was subtherapeutic, last warfarin dose on 8/20.  Today, 10/12/2017:  Heparin level 0.42, therapeutic on Heparin 1400 units/hr  CBC: Hgb low/stable at 9.8, Plt improved to 147  No bleeding or complications reported.  MD notes Hgb decrease, suspects dilutional.   Goal of Therapy:  Heparin level 0.3-0.7 units/ml Monitor platelets by anticoagulation protocol: Yes   Plan:  Continue heparin IV infusion at 1400units/hr  Heparin level in 8 hours to confirm  Daily heparin level and CBC  Continue to monitor H&H and platelets  Follow up long-term anticoagulation plan while on rifampin.   Gretta Arab PharmD, BCPS Pager [phone] 10/12/2017 7:02 AM

## 2017-10-12 NOTE — Progress Notes (Signed)
  Echocardiogram Echocardiogram Transesophageal has been performed.  Paula Ferguson 10/12/2017, 3:07 PM

## 2017-10-12 NOTE — CV Procedure (Signed)
TEE: During this procedure the patient is administered a total of Versed 9 mg and Fentanyl 50 mg and benedryl 25 mg to achieve and maintain moderate conscious sedation.  The patient's heart rate, blood pressure, and oxygen saturation are monitored continuously during the procedure. The period of conscious sedation is 35 minutes, of which I was present face-to-face 100% of this time.  Note patient was a difficult esophageal intubation. Dr Acie Fredrickson called to assist and was able to pass probe. Suggest using propofol if TEE needed in future  EF 65% AVR not well visualized trivial peri valvular regurgitation. Unable to get transgastric gradients. No obvious vegetations Mild MR no vegetations TV poorly seen due to shadowing artifact from AVR no obvious vegetation mild TR Normal PV Normal RV Redundant and mobile septum no obvious PFO No effusion  Jenkins Rouge

## 2017-10-12 NOTE — Progress Notes (Signed)
PT Cancellation Note  Patient Details Name: KEIRY KOWAL MRN: [id number] DOB: [birth year]   Cancelled Treatment:    Reason Eval/Treat Not Completed: Patient at procedure or test/unavailable(pt at TEE at [facility]. Will follow. )   Philomena Doheny 10/12/2017, 1:06 PM [phone]

## 2017-10-12 NOTE — Progress Notes (Signed)
PROGRESS NOTE    Paula Ferguson  LPF:[id number] DOB: 12-17-1940 DOA: 10/09/2017 PCP: Marletta Lor, MD   Brief Narrative:  HPI On 10/09/2017 by Dr. Raiford Noble Paula Ferguson is a 77 y.o. female with medical history significant of CAD s/p CABG x1 and Hx of AVR, HTN, HLD, Hypothyroidism, Anemia, Depression and other comorbidites who presents with worsened Knee pain and a fall.  She has had had Knee Pain Bilateral for at least 6 years and acutely worsened yesterday morning and specifically in her Right Knee. She described it as a stabbing pain. Knee pain is a 10/10 No radiation.  She states that because of her fall she lay on the floor for the whole night because her daughter was unable to pick her up and she refused called EMS.  Daughter family stated that she is calling EMS and they brought her to the emergency room for further evaluation.  He also complained of +Lightheadedness, Dizziness, Weakness and progressively gotten worse the last few months and is more fatigued and exhausted. No CP, Abdominal, No N/V/Diarrhe, no Burning Discomfort but has had increased frequency.  Initial evaluation in the ED showed that she had negative x-rays however she is unable to ambulate and was concerned that she would fall if she even attempted.  TRH was called to admit this patient for her right knee pain and fall as well as mild rhabdomyolysis.  Interim history Admitted for right knee pain and swelling after fall.  Found to have mild rhabdo.  Also noted to have MSSA bacteremia.  Infectious disease consulted.  Ortho consulted.  Cardiology consulted for TEE, to be done today 8/23.   Assessment & Plan   MSSA bacteremia with leukocytosis -Initially admitted with WBC of 14.2, was afebrile at that time -Suspect source is right prosthetic knee -Blood cultures showed MSSA -Infectious disease consulted and appreciated -Continue Ancef and rifampin  -Will repeat blood cultures today -Cardiology  consulted for TEE, planned today 8/23 at 3pm at [facility]  Intractable right knee pain with swelling secondary to fall and infection -Orthopedics consulted and appreciated -Right knee x-ray showed no acute fracture or dislocation. -Right hip x-ray showed no evident acute fracture dislocation -CT of the right knee: No acute osseous injury of the right knee.  Right total knee arthroplasty with a thin lucency around the tibial stem component of the arthroplasty measuring less than 1 mm concerning for loosening.  Large right joint effusion with synovial thickening. -Patient was started on prednisone for possible gout flare.  Although she tells me she has no history of gout. Will wean prednisone. -Pending PT/OT -s/p right knee aspiration, Culture: moderate staph auerus. Gram stain shows abundant GPC -Orthopedics recommending surgery and antibiotic spacer  Acute rhabdomyolysis -In the setting of fall and trauma -CK on admission 1738, down to 269 -Continue IV fluids  Dizziness/lightheadedness/generalized weakness -Has been occurring over the last several months and worsening with postural changes -TSH 2.573 -UA unremarkable  Coronary artery disease/aortic valve replacement -Currently chest pain-free -Continue losartan, verapamil -Coumadin now held given that infectious disease started rifampin -Continue heparin  -Statin held given rhabdo  Essential hypertension -Continue losartan, verapamil  Hyperlipidemia -Statin held due to rhabdomyolysis  Depression -Continue bupropion  Normocytic anemia -hemoglobin currently 9.8, baseline appears to be approximately 11-12 -Suspect drop in hemoglobin secondary to dilutional component as patient is receiving IV fluids -Continue iron supplementation -Monitor CBC   Hypothyroidism -Continue Synthroid -TSH 2.573  DVT Prophylaxis   heparin  Code Status:  Full  Family Communication: None at bedside  Disposition Plan: Admitted. Pending TEE  today and surgery next week by ortho.  Consultants Orthopedics Infectious disease Cardiology  Procedures  None  Antibiotics   Anti-infectives (From admission, onward)   Start     Dose/Rate Route Frequency Ordered Stop   10/11/17 1000  [MAR Hold]  rifampin (RIFADIN) 300 mg in sodium chloride 0.9 % 100 mL IVPB     (MAR Hold since Fri 10/12/2017 at 1322. Reason: Transfer to a Procedural area.)   300 mg 200 mL/hr over 30 Minutes Intravenous Every 12 hours 10/10/17 1603     10/10/17 1200  [MAR Hold]  ceFAZolin (ANCEF) IVPB 2g/100 mL premix     (MAR Hold since Fri 10/12/2017 at 1322. Reason: Transfer to a Procedural area.)   2 g 200 mL/hr over 30 Minutes Intravenous Every 8 hours 10/10/17 1111        Subjective:   [name] seen and examined today.  Feels knee pain has mildly improved. Worried about upcoming procedure and surgery. Denies chest pain, shortness of breath, abdominal pain, N/V/D/C, dizziness, headache.  Objective:   Vitals:   10/11/17 2322 10/12/17 0500 10/12/17 0527 10/12/17 1329  BP: 139/70  117/63 (!) 155/68  Pulse: 96  84 (!) 107  Resp: 18  16 15   Temp: 99.1 F (37.3 C)  98.8 F (37.1 C)   TempSrc: Oral  Oral Oral  SpO2: 95%  94%   Weight:  63 kg    Height:        Intake/Output Summary (Last 24 hours) at 10/12/2017 1400 Last data filed at 10/12/2017 1000 Gross per 24 hour  Intake 2494.27 ml  Output 1000 ml  Net 1494.27 ml   Filed Weights   10/09/17 0858 10/10/17 0626 10/12/17 0500  Weight: 55.3 kg 62.5 kg 63 kg   Exam  General: Well developed, well nourished, NAD, appears stated age  HEENT: NCAT, mucous membranes moist.   Neck: Supple  Cardiovascular: S1 S2 auscultated, RRR, no murmur  Respiratory: Clear to auscultation bilaterally with equal chest rise  Abdomen: Soft, nontender, nondistended, + bowel sounds  Extremities: warm dry without cyanosis clubbing or edema. Right knee edema improved.  Neuro: AAOx3, nonfocal  Psych: Normal  affect and demeanor, pleasant   Data Reviewed: I have personally reviewed following labs and imaging studies  CBC: Recent Labs  Lab 10/09/17 0922 10/10/17 0538 10/11/17 0456 10/12/17 0619  WBC 14.2* 8.4 9.8 9.9  NEUTROABS 12.0*  --   --   --   HGB 11.4* 10.5* 9.7* 9.8*  HCT 34.2* 31.7* 28.8* 28.9*  MCV 96.3 96.4 96.0 94.4  PLT 153 140* 110* 169*   Basic Metabolic Panel: Recent Labs  Lab 10/09/17 0922 10/10/17 0538 10/11/17 0456 10/12/17 0619  NA 140 139 135 137  K 3.8 4.1 4.1 3.5  CL 105 108 104 109  CO2 25 26 23 24   GLUCOSE 92 99 94 96  BUN 21 25* 29* 30*  CREATININE 0.79 0.77 1.00 0.78  CALCIUM 9.0 8.6* 8.4* 8.3*  MG 1.5*  --   --   --   PHOS 2.6  --   --   --    GFR: Estimated Creatinine Clearance: 52.2 mL/min (by C-G formula based on SCr of 0.78 mg/dL). Liver Function Tests: Recent Labs  Lab 10/10/17 0538  AST 73*  ALT 29  ALKPHOS 70  BILITOT 2.1*  PROT 6.2*  ALBUMIN 2.3*   No results for input(s): LIPASE, AMYLASE  in the last 168 hours. No results for input(s): AMMONIA in the last 168 hours. Coagulation Profile: Recent Labs  Lab 10/05/17 1406 10/09/17 0922 10/10/17 0538  INR 4.9* 1.92 1.88   Cardiac Enzymes: Recent Labs  Lab 10/09/17 0922 10/11/17 1432  CKTOTAL 1,802*  1,793* 269*  CKMB 10.5*  --    BNP (last 3 results) No results for input(s): PROBNP in the last 8760 hours. HbA1C: No results for input(s): HGBA1C in the last 72 hours. CBG: Recent Labs  Lab 10/10/17 0734 10/11/17 0806 10/12/17 0734  GLUCAP 81 91 76   Lipid Profile: No results for input(s): CHOL, HDL, LDLCALC, TRIG, CHOLHDL, LDLDIRECT in the last 72 hours. Thyroid Function Tests: No results for input(s): TSH, T4TOTAL, FREET4, T3FREE, THYROIDAB in the last 72 hours. Anemia Panel: No results for input(s): VITAMINB12, FOLATE, FERRITIN, TIBC, IRON, RETICCTPCT in the last 72 hours. Urine analysis:    Component Value Date/Time   COLORURINE AMBER (A) 10/10/2017 0218     APPEARANCEUR CLEAR 10/10/2017 0218   LABSPEC 1.027 10/10/2017 0218   PHURINE 5.0 10/10/2017 0218   GLUCOSEU NEGATIVE 10/10/2017 0218   HGBUR SMALL (A) 10/10/2017 0218   HGBUR negative 04/09/2008 1252   BILIRUBINUR NEGATIVE 10/10/2017 0218   BILIRUBINUR n 03/01/2017 1544   KETONESUR NEGATIVE 10/10/2017 0218   PROTEINUR NEGATIVE 10/10/2017 0218   UROBILINOGEN 1.0 03/01/2017 1544   UROBILINOGEN 1.0 03/14/2011 1520   NITRITE NEGATIVE 10/10/2017 0218   LEUKOCYTESUR NEGATIVE 10/10/2017 0218   Sepsis Labs: @LABRCNTIP (procalcitonin:4,lacticidven:4)  ) Recent Results (from the past 240 hour(s))  Culture, blood (routine x 2)     Status: Abnormal   Collection Time: 10/09/17 12:29 PM  Result Value Ref Range Status   Specimen Description   Final    BLOOD RIGHT ANTECUBITAL Performed at [facility], [place] [address] [place], [place] [address]    Special Requests   Final    BOTTLES DRAWN AEROBIC AND ANAEROBIC Blood Culture results may not be optimal due to an inadequate volume of blood received in culture bottles Performed at [place] [address], [place], [place] [address]    Culture  Setup Time   Final    GRAM POSITIVE COCCI IN BOTH AEROBIC AND ANAEROBIC BOTTLES CRITICAL VALUE NOTED.  VALUE IS CONSISTENT WITH PREVIOUSLY REPORTED AND CALLED VALUE.    Culture (A)  Final    STAPHYLOCOCCUS AUREUS SUSCEPTIBILITIES PERFORMED ON PREVIOUS CULTURE WITHIN THE LAST 5 DAYS. Performed at [facility], [place] [address], [place], [place] [address]    Report Status 10/12/2017 FINAL  Final  Culture, blood (routine x 2)     Status: Abnormal   Collection Time: 10/09/17 12:34 PM  Result Value Ref Range Status   Specimen Description   Final    BLOOD LEFT ANTECUBITAL Performed at [place] [address] [place], [place] [address]    Special Requests   Final    BOTTLES DRAWN AEROBIC AND ANAEROBIC Blood Culture adequate  volume Performed at [place] [address] [place], [place] [address]    Culture  Setup Time   Final    GRAM POSITIVE COCCI IN CLUSTERS IN BOTH AEROBIC AND ANAEROBIC BOTTLES CRITICAL RESULT CALLED TO, READ BACK BY AND VERIFIED WITH: Karel Jarvis PharmD 9:50 10/10/17 ([email or web address]) Performed at [facility], [place] [address], [place], [place] [address]    Culture STAPHYLOCOCCUS AUREUS (A)  Final   Report Status 10/12/2017 FINAL  Final  Organism ID, Bacteria STAPHYLOCOCCUS AUREUS  Final      Susceptibility   Staphylococcus aureus - MIC*    CIPROFLOXACIN <=0.5 SENSITIVE Sensitive     ERYTHROMYCIN >=8 RESISTANT Resistant     GENTAMICIN <=0.5 SENSITIVE Sensitive     OXACILLIN 0.5 SENSITIVE Sensitive     TETRACYCLINE <=1 SENSITIVE Sensitive     VANCOMYCIN <=0.5 SENSITIVE Sensitive     TRIMETH/SULFA <=10 SENSITIVE Sensitive     CLINDAMYCIN <=0.25 SENSITIVE Sensitive     RIFAMPIN <=0.5 SENSITIVE Sensitive     Inducible Clindamycin NEGATIVE Sensitive     * STAPHYLOCOCCUS AUREUS  Blood Culture ID Panel (Reflexed)     Status: Abnormal   Collection Time: 10/09/17 12:34 PM  Result Value Ref Range Status   Enterococcus species NOT DETECTED NOT DETECTED Final   Listeria monocytogenes NOT DETECTED NOT DETECTED Final   Staphylococcus species DETECTED (A) NOT DETECTED Final    Comment: CRITICAL RESULT CALLED TO, READ BACK BY AND VERIFIED WITH: Karel Jarvis PharmD 9:50 10/10/17 ([email or web address])    Staphylococcus aureus DETECTED (A) NOT DETECTED Final    Comment: Methicillin (oxacillin) susceptible Staphylococcus aureus (MSSA). Preferred therapy is anti staphylococcal beta lactam antibiotic (Cefazolin or Nafcillin), unless clinically contraindicated. CRITICAL RESULT CALLED TO, READ BACK BY AND VERIFIED WITH: Karel Jarvis PharmD 9:50 10/10/17 ([email or web address])    Methicillin resistance NOT DETECTED NOT DETECTED Final   Streptococcus species NOT DETECTED NOT DETECTED Final   Streptococcus agalactiae  NOT DETECTED NOT DETECTED Final   Streptococcus pneumoniae NOT DETECTED NOT DETECTED Final   Streptococcus pyogenes NOT DETECTED NOT DETECTED Final   Acinetobacter baumannii NOT DETECTED NOT DETECTED Final   Enterobacteriaceae species NOT DETECTED NOT DETECTED Final   Enterobacter cloacae complex NOT DETECTED NOT DETECTED Final   Escherichia coli NOT DETECTED NOT DETECTED Final   Klebsiella oxytoca NOT DETECTED NOT DETECTED Final   Klebsiella pneumoniae NOT DETECTED NOT DETECTED Final   Proteus species NOT DETECTED NOT DETECTED Final   Serratia marcescens NOT DETECTED NOT DETECTED Final   Haemophilus influenzae NOT DETECTED NOT DETECTED Final   Neisseria meningitidis NOT DETECTED NOT DETECTED Final   Pseudomonas aeruginosa NOT DETECTED NOT DETECTED Final   Candida albicans NOT DETECTED NOT DETECTED Final   Candida glabrata NOT DETECTED NOT DETECTED Final   Candida krusei NOT DETECTED NOT DETECTED Final   Candida parapsilosis NOT DETECTED NOT DETECTED Final   Candida tropicalis NOT DETECTED NOT DETECTED Final    Comment: Performed at [facility], [address], [place], [place] [address]  Urine culture     Status: Abnormal   Collection Time: 10/10/17  2:18 AM  Result Value Ref Range Status   Specimen Description   Final    URINE, RANDOM Performed at [place] [address] [place], [place] [address]    Special Requests   Final    NONE Performed at [facility], [place] [address], [place], [place] [address]    Culture MULTIPLE SPECIES PRESENT, SUGGEST RECOLLECTION (A)  Final   Report Status 10/12/2017 FINAL  Final  Aerobic/Anaerobic Culture (surgical/deep wound)     Status: None (Preliminary result)   Collection Time: 10/10/17  5:45 PM  Result Value Ref Range Status   Specimen Description   Final    SYNOVIAL Performed at [place] [address], [place], [place] [address]    Special Requests   Final     NONE Performed at [facility], [address] [place], [place] [address]    Gram Stain   Final    ABUNDANT WBC PRESENT, PREDOMINANTLY PMN ABUNDANT GRAM POSITIVE COCCI IN PAIRS IN CHAINS IN CLUSTERS Gram Stain Report Called to,Read Back By and Verified With: S.WRIGHT AT 1929 ON 10/10/17 BY N.THOMPSON Performed at [facility], [place] [address] [place], [place] [address]    Culture   Final    MODERATE STAPHYLOCOCCUS AUREUS SUSCEPTIBILITIES TO FOLLOW NO ANAEROBES ISOLATED; CULTURE IN PROGRESS FOR 5 DAYS    Report Status PENDING  Incomplete  Culture, blood (routine x 2)     Status: None (Preliminary result)   Collection Time: 10/11/17  1:09 PM  Result Value Ref Range Status   Specimen Description   Final    BLOOD RIGHT ANTECUBITAL Performed at [place] [address], [place], [place] [address]    Special Requests   Final    BOTTLES DRAWN AEROBIC AND ANAEROBIC Blood Culture adequate volume Performed at [place] [address], [place], [place] [address]    Culture   Final    NO GROWTH < 24 HOURS Performed at [place] [address], [place], [place] [address]    Report Status PENDING  Incomplete  Culture, blood (routine x 2)     Status: None (Preliminary result)   Collection Time: 10/11/17  1:13 PM  Result Value Ref Range Status   Specimen Description   Final    BLOOD BLOOD RIGHT FOREARM Performed at [place] [address], [place], [place] [address]    Special Requests   Final    BOTTLES DRAWN AEROBIC AND ANAEROBIC Blood Culture adequate volume Performed at [place] [address], [place], [place] [address]    Culture   Final    NO GROWTH < 24 HOURS Performed at [place] [address] [place], [place] [address]    Report Status PENDING  Incomplete      Radiology Studies: Nm Bone Scan 3 Phase  Result Date:  10/11/2017 CLINICAL DATA:  Mechanical loosening of joint replacement, RIGHT knee pain since knee replacement surgery in 2014, fell Sunday EXAM: NUCLEAR MEDICINE 3-PHASE BONE SCAN TECHNIQUE: Radionuclide angiographic images, immediate static blood pool images, and 3-hour delayed static images were obtained of the knees after intravenous injection of radiopharmaceutical. RADIOPHARMACEUTICALS:  21.9 mCi Tc-8m MDP IV COMPARISON:  04/06/2017 Radiographic correlation: 10/09/2017 radiographs, CT RIGHT knee 10/09/2017 FINDINGS: Vascular phase: Increased blood flow to RIGHT knee. Normal blood flow LEFT knee. Blood pool phase: Increased blood pool diffusely about RIGHT knee versus LEFT, especially at the medial distal femoral metaphysis and slightly at the tibial plateau greater laterally. Delayed phase: Photopenic defect at RIGHT knee from knee prosthesis. Focal increased tracer accumulation at the RIGHT lateral tibial plateau suspicious for aseptic loosening of the prosthesis, though infection not excluded. No other focal abnormal increased tracer uptake adjacent to prosthetic components of the RIGHT knee. On the LEFT, photopenic defect is seen at the medial compartment question unicompartmental prosthesis. Mild degenerative type uptake at the lateral compartment LEFT knee. IMPRESSION: Increased blood flow, blood pool, and delayed uptake of tracer at the RIGHT knee, with delayed uptake most focal at the RIGHT lateral tibial plateau adjacent to the prosthesis concerning for aseptic loosening or less likely infection of the prosthesis. Degenerative type uptake of tracer at the lateral compartment LEFT knee with evidence of prior medial compartment partial joint replacement. Electronically Signed  By: Lavonia Dana M.D.   On: 10/11/2017 13:16     Scheduled Meds: . [MAR Hold] buPROPion  150 mg Oral Daily  . [MAR Hold] docusate sodium  100 mg Oral BID  . [MAR Hold] ferrous sulfate  325 mg Oral Daily  . [MAR Hold]  levothyroxine  100 mcg Oral QAC breakfast  . [MAR Hold] lidocaine  10 mL Intradermal Once  . [MAR Hold] losartan  100 mg Oral Daily  . [MAR Hold] predniSONE  40 mg Oral Q breakfast  . [MAR Hold] verapamil  120 mg Oral QHS   Continuous Infusions: . sodium chloride 75 mL/hr at 10/12/17 0600  . sodium chloride    . [MAR Hold]  ceFAZolin (ANCEF) IV 2 g (10/12/17 1208)  . heparin 1,400 Units/hr (10/12/17 0600)  . [MAR Hold] rifampin (RIFADIN) IVPB 300 mg (10/12/17 1008)     LOS: 2 days   Time Spent in minutes   30 minutes  Jonica Bickhart D.O. on 10/12/2017 at 2:00 PM  Between 7am to 7pm - Please see pager noted on [email or web address]  After 7pm go to [email or web address]  And look for the night coverage person covering for me after hours  [facility] Office  [phone]

## 2017-10-12 NOTE — Progress Notes (Signed)
ANTICOAGULATION CONSULT NOTE - Follow Up Consult  Pharmacy Consult for Heparin Indication: Aortic mechanical valve   Allergies  Allergen Reactions  . Hydromorphone Hcl Itching    "Dilaudid"  . Meperidine Hcl Itching  . Morphine Sulfate Itching  . Percocet [Oxycodone-Acetaminophen] Itching    Patient Measurements: Height: 5\' 2"  (157.5 cm) Weight: 138 lb 14.2 oz (63 kg) IBW/kg (Calculated) : 50.1 Heparin Dosing Weight: 55 kg  Vital Signs: Temp: 98.9 F (37.2 C) (08/23 1608) Temp Source: Oral (08/23 1608) BP: 147/73 (08/23 1608) Pulse Rate: 106 (08/23 1608)  Labs: Recent Labs    10/10/17 0538  10/11/17 0456  10/11/17 1432 10/11/17 2119 10/12/17 0619 10/12/17 1633  HGB 10.5*  --  9.7*  --   --   --  9.8*  --   HCT 31.7*  --  28.8*  --   --   --  28.9*  --   PLT 140*  --  110*  --   --   --  147*  --   APTT 52*  --   --   --   --   --   --   --   LABPROT 21.4*  --   --   --   --   --   --   --   INR 1.88  --   --   --   --   --   --   --   HEPARINUNFRC  --    < >  --    < >  --  0.27* 0.42 0.37  CREATININE 0.77  --  1.00  --   --   --  0.78  --   CKTOTAL  --   --   --   --  269*  --   --   --    < > = values in this interval not displayed.   Estimated Creatinine Clearance: 52.2 mL/min (by C-G formula based on SCr of 0.78 mg/dL).  Medications:  Scheduled:  . buPROPion  150 mg Oral Daily  . docusate sodium  100 mg Oral BID  . ferrous sulfate  325 mg Oral Daily  . levothyroxine  100 mcg Oral QAC breakfast  . lidocaine  10 mL Intradermal Once  . losartan  100 mg Oral Daily  . [START ON 10/13/2017] predniSONE  20 mg Oral Q breakfast  . verapamil  120 mg Oral QHS   Infusions:  . sodium chloride 75 mL/hr at 10/12/17 0600  .  ceFAZolin (ANCEF) IV 2 g (10/12/17 1208)  . heparin 1,400 Units/hr (10/12/17 0600)  . rifampin (RIFADIN) IVPB 300 mg (10/12/17 1008)   Assessment: 77 yo female with PMH of mechanical aortic vale replacement on chronic warfarin  anticoagulation.  Admitted on 8/20 s/p fall with rhabdo, and with PMH of prosthetic TKR; found to have MSSA bacteremia and prosthetic joint infection.  Pharmacy was initially consulted to dose warfarin inpatient, but was changed from warfarin to Heparin infusion due to drug-drug interaction with Rifampin.    Last INR 1.88 (on 8/21) was subtherapeutic, last warfarin dose 2.5mg  on 8/20.  Today, 10/12/2017:  Heparin level 0.42 this am, therapeutic on Heparin 1400 units/hr  CBC: Hgb low/stable at 9.8, Plt improved to 147  No bleeding or complications reported.  MD notes Hgb decrease, suspects dilutional.    Repeat Heparin level at 1630 = 0.37, remains in therapeutic range  Goal of Therapy:  Heparin level 0.3-0.7 units/ml Monitor platelets by anticoagulation protocol:  Yes   Plan:   Continue heparin IV infusion at 1400units/hr  Daily heparin level and CBC  Continue to monitor H&H and platelets  Follow up long-term anticoagulation plan while on rifampin.  Minda Ditto PharmD Pager [phone] 10/12/2017, 5:20 PM

## 2017-10-12 NOTE — Progress Notes (Signed)
Patient ID: Paula Ferguson, female   DOB: Nov 30, 1940, 77 y.o.   MRN: [id number]  Septic right TKR Will need resection and placement of antibiotic spacer Due to pain and limited activity will have therapy evaluate her Surgery early next week as I know my OR schedule better Continue IV antibiotics for now Activity per therapy

## 2017-10-12 NOTE — Progress Notes (Signed)
[facility] for Infectious Disease    Date of Admission:  10/09/2017   Total days of antibiotics 3        Day 3 cefazolin        Day 2 rif           ID: Paula Ferguson is a 77 y.o. female with hx of AVR and multiple joint replacements admitted with SIRS/sepsis found to have MSSA bacteremia and right knee PJI Active Problems:   Hypothyroidism   Hyperlipidemia   ANEMIA   Essential hypertension   Coronary atherosclerosis   S/P right knee replacement   Anticoagulation goal of INR 2.5 to 3.5   History of mechanical aortic valve replacement   Chronic pain   Knee pain, acute   Fall   Leukocytosis   Rhabdomyolysis   Dizziness   Lightheadedness   Generalized weakness    Subjective: Afebrile, underwent TEE which did not show any vegetations on her mechanical valve. Sleeping from sedation from procedure  Repeat blood cx from 8/22 NGTD at 24hr Knee synovial fluid MSSA Blood cx from 8/20 MSSA  Medications:  . buPROPion  150 mg Oral Daily  . docusate sodium  100 mg Oral BID  . ferrous sulfate  325 mg Oral Daily  . levothyroxine  100 mcg Oral QAC breakfast  . lidocaine  10 mL Intradermal Once  . losartan  100 mg Oral Daily  . [START ON 10/13/2017] predniSONE  20 mg Oral Q breakfast  . verapamil  120 mg Oral QHS    Objective: Vital signs in last 24 hours: Temp:  [98.2 F (36.8 C)-99.1 F (37.3 C)] 98.9 F (37.2 C) (08/23 1608) Pulse Rate:  [84-123] 106 (08/23 1608) Resp:  [11-24] 18 (08/23 1608) BP: (115-175)/(57-92) 147/73 (08/23 1608) SpO2:  [93 %-100 %] 98 % (08/23 1608) Weight:  [63 kg] 63 kg (08/23 0500) Physical Exam  Constitutional:  Sleeping . appears well-developed and well-nourished. No distress.  HENT: [place]/AT, Mouth/Throat: Oropharynx is clear and moist. No oropharyngeal exudate.  Cardiovascular: Normal rate, mechanical click of AVR Pulmonary/Chest: Effort normal and breath sounds normal. No respiratory distress.  has no wheezes.  Abdominal: Soft.  Bowel sounds are normal.  exhibits no distension. There is no tenderness.  Skin: Skin is warm and dry. No rash noted. No erythema.    Lab Results Recent Labs    10/11/17 0456 10/12/17 0619  WBC 9.8 9.9  HGB 9.7* 9.8*  HCT 28.8* 28.9*  NA 135 137  K 4.1 3.5  CL 104 109  CO2 23 24  BUN 29* 30*  CREATININE 1.00 0.78   Liver Panel Recent Labs    10/10/17 0538  PROT 6.2*  ALBUMIN 2.3*  AST 73*  ALT 29  ALKPHOS 70  BILITOT 2.1*    Microbiology: reviewed Studies/Results: Nm Bone Scan 3 Phase  Result Date: 10/11/2017 CLINICAL DATA:  Mechanical loosening of joint replacement, RIGHT knee pain since knee replacement surgery in 2014, fell Sunday EXAM: NUCLEAR MEDICINE 3-PHASE BONE SCAN TECHNIQUE: Radionuclide angiographic images, immediate static blood pool images, and 3-hour delayed static images were obtained of the knees after intravenous injection of radiopharmaceutical. RADIOPHARMACEUTICALS:  21.9 mCi Tc-76m MDP IV COMPARISON:  04/06/2017 Radiographic correlation: 10/09/2017 radiographs, CT RIGHT knee 10/09/2017 FINDINGS: Vascular phase: Increased blood flow to RIGHT knee. Normal blood flow LEFT knee. Blood pool phase: Increased blood pool diffusely about RIGHT knee versus LEFT, especially at the medial distal femoral metaphysis and slightly at the tibial plateau greater  laterally. Delayed phase: Photopenic defect at RIGHT knee from knee prosthesis. Focal increased tracer accumulation at the RIGHT lateral tibial plateau suspicious for aseptic loosening of the prosthesis, though infection not excluded. No other focal abnormal increased tracer uptake adjacent to prosthetic components of the RIGHT knee. On the LEFT, photopenic defect is seen at the medial compartment question unicompartmental prosthesis. Mild degenerative type uptake at the lateral compartment LEFT knee. IMPRESSION: Increased blood flow, blood pool, and delayed uptake of tracer at the RIGHT knee, with delayed uptake most  focal at the RIGHT lateral tibial plateau adjacent to the prosthesis concerning for aseptic loosening or less likely infection of the prosthesis. Degenerative type uptake of tracer at the lateral compartment LEFT knee with evidence of prior medial compartment partial joint replacement. Electronically Signed   By: Lavonia Dana M.D.   On: 10/11/2017 13:16    Assessment and plan: complicated MSSA bacteremia with right knee PJI. Prosthetic valve endocarditis has been ruled out on TEE  MSSA bacteremia = continue on cefazolin and rifampin  pji = dr Alvan Dame to plan on extraction of prosthesis with abtx spacer. Continue on cefazolin and rifampin  Mechanical valve = will need to consider other anticoagulant other than warfarin for discharge later next week.  Dr Megan Salon available for questions  [facility] for Infectious Diseases Cell: [phone] Pager: [phone]  10/12/2017, 4:15 PM

## 2017-10-12 NOTE — Interval H&P Note (Signed)
History and Physical Interval Note:  10/12/2017 12:47 PM  Paula Ferguson  has presented today for surgery, with the diagnosis of bacteremia  The various methods of treatment have been discussed with the patient and family. After consideration of risks, benefits and other options for treatment, the patient has consented to  Procedure(s): TRANSESOPHAGEAL ECHOCARDIOGRAM (TEE) (N/A) as a surgical intervention .  The patient's history has been reviewed, patient examined, no change in status, stable for surgery.  I have reviewed the patient's chart and labs.  Questions were answered to the patient's satisfaction.     Jenkins Rouge

## 2017-10-13 LAB — BASIC METABOLIC PANEL
ANION GAP: 5 (ref 5–15)
BUN: 27 mg/dL — ABNORMAL HIGH (ref 8–23)
CO2: 23 mmol/L (ref 22–32)
Calcium: 8.1 mg/dL — ABNORMAL LOW (ref 8.9–10.3)
Chloride: 111 mmol/L (ref 98–111)
Creatinine, Ser: 0.73 mg/dL (ref 0.44–1.00)
Glucose, Bld: 83 mg/dL (ref 70–99)
POTASSIUM: 3.1 mmol/L — AB (ref 3.5–5.1)
SODIUM: 139 mmol/L (ref 135–145)

## 2017-10-13 LAB — CBC
HEMATOCRIT: 29 % — AB (ref 36.0–46.0)
Hemoglobin: 9.8 g/dL — ABNORMAL LOW (ref 12.0–15.0)
MCH: 31.8 pg (ref 26.0–34.0)
MCHC: 33.8 g/dL (ref 30.0–36.0)
MCV: 94.2 fL (ref 78.0–100.0)
Platelets: 143 10*3/uL — ABNORMAL LOW (ref 150–400)
RBC: 3.08 MIL/uL — AB (ref 3.87–5.11)
RDW: 17.5 % — ABNORMAL HIGH (ref 11.5–15.5)
WBC: 14.2 10*3/uL — ABNORMAL HIGH (ref 4.0–10.5)

## 2017-10-13 LAB — GLUCOSE, CAPILLARY: GLUCOSE-CAPILLARY: 99 mg/dL (ref 70–99)

## 2017-10-13 LAB — MAGNESIUM: Magnesium: 1.8 mg/dL (ref 1.7–2.4)

## 2017-10-13 LAB — HEPARIN LEVEL (UNFRACTIONATED): HEPARIN UNFRACTIONATED: 0.4 [IU]/mL (ref 0.30–0.70)

## 2017-10-13 LAB — CK: CK TOTAL: 70 U/L (ref 38–234)

## 2017-10-13 MED ORDER — HEPARIN (PORCINE) IN NACL 100-0.45 UNIT/ML-% IJ SOLN
1400.0000 [IU]/h | INTRAMUSCULAR | Status: DC
  Administered 2017-10-13 – 2017-10-14 (×3): 1400 [IU]/h via INTRAVENOUS
  Filled 2017-10-13 (×4): qty 250

## 2017-10-13 MED ORDER — POTASSIUM CHLORIDE CRYS ER 20 MEQ PO TBCR
40.0000 meq | EXTENDED_RELEASE_TABLET | Freq: Once | ORAL | Status: AC
  Administered 2017-10-13: 40 meq via ORAL
  Filled 2017-10-13: qty 2

## 2017-10-13 NOTE — Progress Notes (Signed)
   Subjective: 1 Day Post-Op Procedure(s) (LRB): TRANSESOPHAGEAL ECHOCARDIOGRAM (TEE) (N/A)  Recheck right knee due to sepsis Plan for surgery early next week Currently pt resting easily awakened Denies any new symptoms or issues Patient reports pain as mild.  Objective:   VITALS:   Vitals:   10/12/17 2123 10/13/17 0522  BP: 137/68 (!) 141/72  Pulse: (!) 106 (!) 120  Resp: 17 14  Temp: 98.1 F (36.7 C) 99.7 F (37.6 C)  SpO2: 96% 100%    Right knee with small effusion Mild pain with rom nv intact distally No rashes or edema distally  LABS Recent Labs    10/11/17 0456 10/12/17 0619 10/13/17 0514  HGB 9.7* 9.8* 9.8*  HCT 28.8* 28.9* 29.0*  WBC 9.8 9.9 14.2*  PLT 110* 147* 143*    Recent Labs    10/11/17 0456 10/12/17 0619 10/13/17 0514  NA 135 137 139  K 4.1 3.5 3.1*  BUN 29* 30* 27*  CREATININE 1.00 0.78 0.73  GLUCOSE 94 96 83     Assessment/Plan: 1 Day Post-Op Procedure(s) (LRB): TRANSESOPHAGEAL ECHOCARDIOGRAM (TEE) (N/A) Right knee sepsis Plan for surgery by Dr. Alvan Dame early next week Continue current treatment Pain management as needed     Merla Riches PA-C, [facility] now [facility]  [facility] [address], [place], [place], [place] [address] Phone: [phone] [email or web address] Facebook  [employer]

## 2017-10-13 NOTE — Progress Notes (Addendum)
PROGRESS NOTE    Paula Ferguson  IRW:[id number] DOB: [birth year] DOA: 10/09/2017 PCP: Marletta Lor, MD   Brief Narrative:  HPI On 10/09/2017 by Dr. Raiford Noble Paula Ferguson is a 77 y.o. female with medical history significant of CAD s/p CABG x1 and Hx of AVR, HTN, HLD, Hypothyroidism, Anemia, Depression and other comorbidites who presents with worsened Knee pain and a fall.  She has had had Knee Pain Bilateral for at least 6 years and acutely worsened yesterday morning and specifically in her Right Knee. She described it as a stabbing pain. Knee pain is a 10/10 No radiation.  She states that because of her fall she lay on the floor for the whole night because her daughter was unable to pick her up and she refused called EMS.  Daughter family stated that she is calling EMS and they brought her to the emergency room for further evaluation.  He also complained of +Lightheadedness, Dizziness, Weakness and progressively gotten worse the last few months and is more fatigued and exhausted. No CP, Abdominal, No N/V/Diarrhe, no Burning Discomfort but has had increased frequency.  Initial evaluation in the ED showed that she had negative x-rays however she is unable to ambulate and was concerned that she would fall if she even attempted.  [facility] was called to admit this patient for her right knee pain and fall as well as mild rhabdomyolysis.  Interim history Admitted for right knee pain and swelling after fall.  Found to have mild rhabdo.  Also noted to have MSSA bacteremia.  Infectious disease consulted.  Ortho consulted.  Cardiology consulted s/p TEE. Pending ortho surgery.  Assessment & Plan   MSSA bacteremia with leukocytosis -Initially admitted with WBC of 14.2, was afebrile at that time -Suspect source is right prosthetic knee -Blood cultures showed MSSA -Infectious disease consulted and appreciated -Continue Ancef and rifampin  -Repeat blood cultures on 10/11/2017 show no growth to  date -Cardiology consulted, status post TEE, no vegetations noted  Intractable right knee pain with swelling secondary to fall and infection -Orthopedics consulted and appreciated -Right knee x-ray showed no acute fracture or dislocation. -Right hip x-ray showed no evident acute fracture dislocation -CT of the right knee: No acute osseous injury of the right knee.  Right total knee arthroplasty with a thin lucency around the tibial stem component of the arthroplasty measuring less than 1 mm concerning for loosening.  Large right joint effusion with synovial thickening. -Patient was started on prednisone for possible gout flare.  Although she tells me she has no history of gout. Will wean prednisone. -PT recommending SNF versus home health pending operative course -s/p right knee aspiration, Culture: moderate staph auerus. Gram stain shows abundant GPC -Orthopedics recommending surgery and antibiotic spacer  Acute rhabdomyolysis -In the setting of fall and trauma -CK on admission 1738, down to 269 -Continue IV fluids  Dizziness/lightheadedness/generalized weakness -Has been occurring over the last several months and worsening with postural changes -TSH 2.573 -UA unremarkable  Coronary artery disease/aortic valve replacement -Currently chest pain-free -Continue losartan, verapamil -Coumadin now held given that infectious disease started rifampin -Continue heparin  -Statin held given rhabdo  Essential hypertension -Continue losartan, verapamil  Hyperlipidemia -Statin held due to rhabdomyolysis  Depression -Continue bupropion  Normocytic anemia -hemoglobin currently 9.8, baseline appears to be approximately 11-12 -Suspect drop in hemoglobin secondary to dilutional component as patient is receiving IV fluids -Continue iron supplementation -Continue to monitor CBC   Hypothyroidism -Continue Synthroid -TSH 2.573  Hypokalemia -  Will replace and continue to monitor BMP -Wll  check magnesium  DVT Prophylaxis   heparin  Code Status: Full  Family Communication: None at bedside  Disposition Plan: Admitted. Pending extraction of right knee prosthesis and placement of antibiotic spacer by orthopedic surgery sometime next week.  Disposition pending.  Consultants Orthopedics Infectious disease Cardiology  Procedures  None  Antibiotics   Anti-infectives (From admission, onward)   Start     Dose/Rate Route Frequency Ordered Stop   10/11/17 1000  rifampin (RIFADIN) 300 mg in sodium chloride 0.9 % 100 mL IVPB     300 mg 200 mL/hr over 30 Minutes Intravenous Every 12 hours 10/10/17 1603     10/10/17 1200  ceFAZolin (ANCEF) IVPB 2g/100 mL premix     2 g 200 mL/hr over 30 Minutes Intravenous Every 8 hours 10/10/17 1111        Subjective:   Paula Ferguson seen and examined today.  Feels pain has mildly improved and wishes to move a bit.  Denies current chest pain, shortness breath, abdominal pain, nausea or vomiting, diarrhea constipation, dizziness or headache.  Objective:   Vitals:   10/12/17 1500 10/12/17 1608 10/12/17 2123 10/13/17 0522  BP:  (!) 147/73 137/68 (!) 141/72  Pulse: 99 (!) 106 (!) 106 (!) 120  Resp: (!) 22 18 17 14   Temp:  98.9 F (37.2 C) 98.1 F (36.7 C) 99.7 F (37.6 C)  TempSrc:  Oral Oral Oral  SpO2: 98% 98% 96% 100%  Weight:      Height:        Intake/Output Summary (Last 24 hours) at 10/13/2017 1218 Last data filed at 10/13/2017 1100 Gross per 24 hour  Intake 1907.99 ml  Output 400 ml  Net 1507.99 ml   Filed Weights   10/09/17 0858 10/10/17 0626 10/12/17 0500  Weight: 55.3 kg 62.5 kg 63 kg   Exam  General: Well developed, well nourished, NAD, appears stated age  46: NCAT, mucous membranes moist.   Neck: Supple  Cardiovascular: S1 S2 auscultated, no rubs, murmurs or gallops. Regular rate and rhythm.  Respiratory: Clear to auscultation bilaterally with equal chest rise  Abdomen: Soft, nontender,  nondistended, + bowel sounds  Extremities: warm dry without cyanosis clubbing or edema.  Right knee edema improved  Neuro: AAOx3, nonfocal  Psych: appropriate mood and affect, pleasant  Data Reviewed: I have personally reviewed following labs and imaging studies  CBC: Recent Labs  Lab 10/09/17 0922 10/10/17 0538 10/11/17 0456 10/12/17 0619 10/13/17 0514  WBC 14.2* 8.4 9.8 9.9 14.2*  NEUTROABS 12.0*  --   --   --   --   HGB 11.4* 10.5* 9.7* 9.8* 9.8*  HCT 34.2* 31.7* 28.8* 28.9* 29.0*  MCV 96.3 96.4 96.0 94.4 94.2  PLT 153 140* 110* 147* 283*   Basic Metabolic Panel: Recent Labs  Lab 10/09/17 0922 10/10/17 0538 10/11/17 0456 10/12/17 0619 10/13/17 0514  NA 140 139 135 137 139  K 3.8 4.1 4.1 3.5 3.1*  CL 105 108 104 109 111  CO2 25 26 23 24 23   GLUCOSE 92 99 94 96 83  BUN 21 25* 29* 30* 27*  CREATININE 0.79 0.77 1.00 0.78 0.73  CALCIUM 9.0 8.6* 8.4* 8.3* 8.1*  MG 1.5*  --   --   --   --   PHOS 2.6  --   --   --   --    GFR: Estimated Creatinine Clearance: 52.2 mL/min (by C-G formula based on SCr of  0.73 mg/dL). Liver Function Tests: Recent Labs  Lab 10/10/17 0538  AST 73*  ALT 29  ALKPHOS 70  BILITOT 2.1*  PROT 6.2*  ALBUMIN 2.3*   No results for input(s): LIPASE, AMYLASE in the last 168 hours. No results for input(s): AMMONIA in the last 168 hours. Coagulation Profile: Recent Labs  Lab 10/09/17 0922 10/10/17 0538  INR 1.92 1.88   Cardiac Enzymes: Recent Labs  Lab 10/09/17 0922 10/11/17 1432  CKTOTAL 1,802*  1,793* 269*  CKMB 10.5*  --    BNP (last 3 results) No results for input(s): PROBNP in the last 8760 hours. HbA1C: No results for input(s): HGBA1C in the last 72 hours. CBG: Recent Labs  Lab 10/10/17 0734 10/11/17 0806 10/12/17 0734 10/13/17 0749  GLUCAP 81 91 76 99   Lipid Profile: No results for input(s): CHOL, HDL, LDLCALC, TRIG, CHOLHDL, LDLDIRECT in the last 72 hours. Thyroid Function Tests: No results for input(s):  TSH, T4TOTAL, FREET4, T3FREE, THYROIDAB in the last 72 hours. Anemia Panel: No results for input(s): VITAMINB12, FOLATE, FERRITIN, TIBC, IRON, RETICCTPCT in the last 72 hours. Urine analysis:    Component Value Date/Time   COLORURINE AMBER (A) 10/10/2017 0218   APPEARANCEUR CLEAR 10/10/2017 0218   LABSPEC 1.027 10/10/2017 0218   PHURINE 5.0 10/10/2017 0218   GLUCOSEU NEGATIVE 10/10/2017 0218   HGBUR SMALL (A) 10/10/2017 0218   HGBUR negative 04/09/2008 1252   BILIRUBINUR NEGATIVE 10/10/2017 0218   BILIRUBINUR n 03/01/2017 1544   KETONESUR NEGATIVE 10/10/2017 0218   PROTEINUR NEGATIVE 10/10/2017 0218   UROBILINOGEN 1.0 03/01/2017 1544   UROBILINOGEN 1.0 03/14/2011 1520   NITRITE NEGATIVE 10/10/2017 0218   LEUKOCYTESUR NEGATIVE 10/10/2017 0218   Sepsis Labs: @LABRCNTIP (procalcitonin:4,lacticidven:4)  ) Recent Results (from the past 240 hour(s))  Culture, blood (routine x 2)     Status: Abnormal   Collection Time: 10/09/17 12:29 PM  Result Value Ref Range Status   Specimen Description   Final    BLOOD RIGHT ANTECUBITAL Performed at [facility], [place] [address], [place], [place] [address]    Special Requests   Final    BOTTLES DRAWN AEROBIC AND ANAEROBIC Blood Culture results may not be optimal due to an inadequate volume of blood received in culture bottles Performed at [facility] [address], [place], [place] [address]    Culture  Setup Time   Final    GRAM POSITIVE COCCI IN BOTH AEROBIC AND ANAEROBIC BOTTLES CRITICAL VALUE NOTED.  VALUE IS CONSISTENT WITH PREVIOUSLY REPORTED AND CALLED VALUE.    Culture (A)  Final    STAPHYLOCOCCUS AUREUS SUSCEPTIBILITIES PERFORMED ON PREVIOUS CULTURE WITHIN THE LAST 5 DAYS. Performed at [facility], [place] [address], [place], [place] [address]    Report Status 10/12/2017 FINAL  Final  Culture, blood (routine x 2)     Status: Abnormal   Collection Time: 10/09/17 12:34 PM  Result Value  Ref Range Status   Specimen Description   Final    BLOOD LEFT ANTECUBITAL Performed at [facility] [address] [place], [place] [address]    Special Requests   Final    BOTTLES DRAWN AEROBIC AND ANAEROBIC Blood Culture adequate volume Performed at [facility] [address] [place], [place] [address]    Culture  Setup Time   Final    GRAM POSITIVE COCCI IN CLUSTERS IN BOTH AEROBIC AND ANAEROBIC BOTTLES CRITICAL RESULT CALLED TO, READ BACK BY AND VERIFIED WITH:  T. Green PharmD 9:50 10/10/17 ([email or web address]) Performed at [facility], [place] [address], [place], [place] [address]    Culture STAPHYLOCOCCUS AUREUS (A)  Final   Report Status 10/12/2017 FINAL  Final   Organism ID, Bacteria STAPHYLOCOCCUS AUREUS  Final      Susceptibility   Staphylococcus aureus - MIC*    CIPROFLOXACIN <=0.5 SENSITIVE Sensitive     ERYTHROMYCIN >=8 RESISTANT Resistant     GENTAMICIN <=0.5 SENSITIVE Sensitive     OXACILLIN 0.5 SENSITIVE Sensitive     TETRACYCLINE <=1 SENSITIVE Sensitive     VANCOMYCIN <=0.5 SENSITIVE Sensitive     TRIMETH/SULFA <=10 SENSITIVE Sensitive     CLINDAMYCIN <=0.25 SENSITIVE Sensitive     RIFAMPIN <=0.5 SENSITIVE Sensitive     Inducible Clindamycin NEGATIVE Sensitive     * STAPHYLOCOCCUS AUREUS  Blood Culture ID Panel (Reflexed)     Status: Abnormal   Collection Time: 10/09/17 12:34 PM  Result Value Ref Range Status   Enterococcus species NOT DETECTED NOT DETECTED Final   Listeria monocytogenes NOT DETECTED NOT DETECTED Final   Staphylococcus species DETECTED (A) NOT DETECTED Final    Comment: CRITICAL RESULT CALLED TO, READ BACK BY AND VERIFIED WITH: Karel Jarvis PharmD 9:50 10/10/17 ([email or web address])    Staphylococcus aureus DETECTED (A) NOT DETECTED Final    Comment: Methicillin (oxacillin) susceptible Staphylococcus aureus (MSSA). Preferred therapy is anti staphylococcal beta lactam antibiotic (Cefazolin or Nafcillin), unless clinically  contraindicated. CRITICAL RESULT CALLED TO, READ BACK BY AND VERIFIED WITH: Karel Jarvis PharmD 9:50 10/10/17 (wilsonm)    Methicillin resistance NOT DETECTED NOT DETECTED Final   Streptococcus species NOT DETECTED NOT DETECTED Final   Streptococcus agalactiae NOT DETECTED NOT DETECTED Final   Streptococcus pneumoniae NOT DETECTED NOT DETECTED Final   Streptococcus pyogenes NOT DETECTED NOT DETECTED Final   Acinetobacter baumannii NOT DETECTED NOT DETECTED Final   Enterobacteriaceae species NOT DETECTED NOT DETECTED Final   Enterobacter cloacae complex NOT DETECTED NOT DETECTED Final   Escherichia coli NOT DETECTED NOT DETECTED Final   Klebsiella oxytoca NOT DETECTED NOT DETECTED Final   Klebsiella pneumoniae NOT DETECTED NOT DETECTED Final   Proteus species NOT DETECTED NOT DETECTED Final   Serratia marcescens NOT DETECTED NOT DETECTED Final   Haemophilus influenzae NOT DETECTED NOT DETECTED Final   Neisseria meningitidis NOT DETECTED NOT DETECTED Final   Pseudomonas aeruginosa NOT DETECTED NOT DETECTED Final   Candida albicans NOT DETECTED NOT DETECTED Final   Candida glabrata NOT DETECTED NOT DETECTED Final   Candida krusei NOT DETECTED NOT DETECTED Final   Candida parapsilosis NOT DETECTED NOT DETECTED Final   Candida tropicalis NOT DETECTED NOT DETECTED Final    Comment: Performed at [facility], [address] [place], [place] [address]  Urine culture     Status: Abnormal   Collection Time: 10/10/17  2:18 AM  Result Value Ref Range Status   Specimen Description   Final    URINE, RANDOM Performed at [facility] [address] [place], [place] [address]    Special Requests   Final    NONE Performed at [facility], [place] [address] [place], [place] [address]    Culture MULTIPLE SPECIES PRESENT, SUGGEST RECOLLECTION (A)  Final   Report Status 10/12/2017 FINAL  Final  Aerobic/Anaerobic Culture (surgical/deep wound)     Status: None  (Preliminary result)   Collection Time: 10/10/17  5:45 PM  Result Value Ref Range Status   Specimen Description  Final    SYNOVIAL Performed at [facility], [place] [address] [place], [place] [address]    Special Requests   Final    NONE Performed at [facility], [place] [address] [place], [place] [address]    Gram Stain   Final    ABUNDANT WBC PRESENT, PREDOMINANTLY PMN ABUNDANT GRAM POSITIVE COCCI IN PAIRS IN CHAINS IN CLUSTERS Gram Stain Report Called to,Read Back By and Verified With: S.WRIGHT AT 1929 ON 10/10/17 BY N.THOMPSON Performed at [facility], [place] [address], [place], [place] [address]    Culture   Final    MODERATE STAPHYLOCOCCUS AUREUS SUSCEPTIBILITIES TO FOLLOW NO ANAEROBES ISOLATED; CULTURE IN PROGRESS FOR 5 DAYS    Report Status PENDING  Incomplete  Culture, blood (routine x 2)     Status: None (Preliminary result)   Collection Time: 10/11/17  1:09 PM  Result Value Ref Range Status   Specimen Description   Final    BLOOD RIGHT ANTECUBITAL Performed at [facility] [address], [place], [place] [address]    Special Requests   Final    BOTTLES DRAWN AEROBIC AND ANAEROBIC Blood Culture adequate volume Performed at [facility] [address], [place], [place] [address]    Culture   Final    NO GROWTH 2 DAYS Performed at [facility] [address], [place], [place] [address]    Report Status PENDING  Incomplete  Culture, blood (routine x 2)     Status: None (Preliminary result)   Collection Time: 10/11/17  1:13 PM  Result Value Ref Range Status   Specimen Description   Final    BLOOD BLOOD RIGHT FOREARM Performed at [place] [address], [place], [place] [address]    Special Requests   Final    BOTTLES DRAWN AEROBIC AND ANAEROBIC Blood Culture adequate volume Performed at [facility] [address], [place], [place] [address]    Culture   Final    NO GROWTH 2 DAYS Performed at [facility] [address], [place], [place] [address]    Report Status PENDING  Incomplete      Radiology Studies: No results found.   Scheduled Meds: . buPROPion  150 mg Oral Daily  . docusate sodium  100 mg Oral BID  . ferrous sulfate  325 mg Oral Daily  . levothyroxine  100 mcg Oral QAC breakfast  . lidocaine  10 mL Intradermal Once  . losartan  100 mg Oral Daily  . predniSONE  20 mg Oral Q breakfast  . verapamil  120 mg Oral QHS   Continuous Infusions: . sodium chloride 75 mL/hr at 10/13/17 1100  . sodium chloride 10 mL/hr at 10/13/17 1100  .  ceFAZolin (ANCEF) IV Stopped (10/13/17 0618)  . heparin 1,400 Units/hr (10/13/17 1100)  . rifampin (RIFADIN) IVPB 300 mg (10/13/17 1100)     LOS: 3 days   Time Spent in minutes   30 minutes  Azrielle Springsteen D.O. on 10/13/2017 at 12:17 PM  Between 7am to 7pm - Please see pager noted on [email or web address]  After 7pm go to [email or web address]  And look for the night coverage person covering for me after hours  [facility] Office  [phone]

## 2017-10-13 NOTE — Evaluation (Signed)
Physical Therapy Evaluation Patient Details Name: Paula Ferguson MRN: [id number] DOB: 03-23-1940 Today's Date: 10/13/2017   History of Present Illness  77 y.o. female with medical history significant of CAD s/p CABG x1 and Hx of AVR, HTN, HLD, Hypothyroidism, Anemia, Depression presents  10/09/17 with worsened Knee pain and a fall. Dx of septic TKA. Plan for resection early next week.   Clinical Impression  The patient  participated in mobility to [facility] with 2 assist. Plans are for resection of the right TKA with antibiotic spacer in near future. DC plan to be confirmed after surgery. Pt admitted with above diagnosis. Pt currently with functional limitations due to the deficits listed below (see PT Problem List).  Pt will benefit from skilled PT to increase their independence and safety with mobility to allow discharge to the venue listed below.       Follow Up Recommendations SNF;Home health PT(vesus, depending on postop recovery)    Equipment Recommendations  None recommended by PT    Recommendations for Other Services       Precautions / Restrictions Precautions Precautions: Knee;Fall      Mobility  Bed Mobility Overal bed mobility: Needs Assistance Bed Mobility: Supine to Sit;Sit to Supine     Supine to sit: Mod assist;+2 for physical assistance;+2 for safety/equipment;HOB elevated Sit to supine: Mod assist;+2 for physical assistance;+2 for safety/equipment   General bed mobility comments: for support of the  right leg, support trunk, unable to use the left UE for  mobility out of bed.  Transfers Overall transfer level: Needs assistance   Transfers: Sit to/from Stand;Stand Pivot Transfers Sit to Stand: Mod assist;+2 physical assistance;+2 safety/equipment Stand pivot transfers: +2 safety/equipment;+2 physical assistance;Mod assist       General transfer comment: assist to rise from bed and steady. Small shuffle steps to  [facility] and assist to lower. Assit to rise from [facility]  and pivot to BE with bear hug like  support. Minimal weight tolerated on the right leg.  Ambulation/Gait                Stairs            Wheelchair Mobility    Modified Rankin (Stroke Patients Only)       Balance                                             Pertinent Vitals/Pain Pain Assessment: Faces Faces Pain Scale: Hurts even more Pain Location: right knee Pain Descriptors / Indicators: Aching Pain Intervention(s): Limited activity within patient's tolerance;Monitored during session;Premedicated before session;Repositioned    Home Living Family/patient expects to be discharged to:: Private residence Living Arrangements: Children Available Help at Discharge: Family Type of Home: House Home Access: Stairs to enter Entrance Stairs-Rails: Left Entrance Stairs-Number of Steps: 4 Home Layout: Two level;Bed/bath upstairs   Additional Comments: has been staying on first level, sleeping on couch    Prior Function Level of Independence: Independent               Hand Dominance        Extremity/Trunk Assessment        Lower Extremity Assessment Lower Extremity Assessment: RLE deficits/detail RLE Deficits / Details: tolerates about 60 degrees  knee flexion during mobility, requires support to move    Cervical / Trunk Assessment Cervical / Trunk Assessment: Kyphotic  Communication  Communication: No difficulties  Cognition Arousal/Alertness: Awake/alert Behavior During Therapy: WFL for tasks assessed/performed Overall Cognitive Status: Within Functional Limits for tasks assessed                                 General Comments: emotional labile      General Comments      Exercises     Assessment/Plan    PT Assessment Patient needs continued PT services  PT Problem List Decreased strength;Decreased activity tolerance;Decreased balance;Decreased mobility;Decreased knowledge of precautions;Decreased  safety awareness;Pain;Decreased knowledge of use of DME       PT Treatment Interventions DME instruction;Gait training;Functional mobility training;Therapeutic activities;Therapeutic exercise;Patient/family education    PT Goals (Current goals can be found in the Care Plan section)  Acute Rehab PT Goals Patient Stated Goal: to get my knee fixed and not be in pain PT Goal Formulation: With patient Time For Goal Achievement: 10/27/17 Potential to Achieve Goals: Fair    Frequency Min 2X/week   Barriers to discharge        Co-evaluation               AM-PAC PT "6 Clicks" Daily Activity  Outcome Measure Difficulty turning over in bed (including adjusting bedclothes, sheets and blankets)?: Unable Difficulty moving from lying on back to sitting on the side of the bed? : Unable Difficulty sitting down on and standing up from a chair with arms (e.g., wheelchair, bedside commode, etc,.)?: Unable Help needed moving to and from a bed to chair (including a wheelchair)?: Total Help needed walking in hospital room?: Total Help needed climbing 3-5 steps with a railing? : Total 6 Click Score: 6    End of Session   Activity Tolerance: Patient limited by pain Patient left: in bed;with call bell/phone within reach;with bed alarm set;with nursing/sitter in room Nurse Communication: Mobility status PT Visit Diagnosis: Unsteadiness on feet (R26.81);History of falling (Z91.81)    Time: 9292-4462 PT Time Calculation (min) (ACUTE ONLY): 27 min   Charges:   PT Evaluation $PT Eval Low Complexity: 1 Low          [place] [facility] [phone]   [name] 10/13/2017, 12:00 PM

## 2017-10-13 NOTE — Evaluation (Signed)
Occupational Therapy Evaluation Patient Details Name: Paula Ferguson MRN: [id number] DOB: [birth year] Today's Date: 10/13/2017    History of Present Illness 77 y.o. female with medical history significant of CAD s/p CABG x1 and Hx of AVR, HTN, HLD, Hypothyroidism, Anemia, Depression presents  10/09/17 with worsened Knee pain and a fall. Dx of septic TKA. Plan for resection early next week.    Clinical Impression   This 77 year old female was admitted for the above.  Per Dr Aurea Graff note, he wanted therapy to evaluate her prior to sx due to limited activity.  Plan is for sx next week. Pt needs +2 assistance for mobility and LB adls. Will follow in acute setting with the goals listed below.    Follow Up Recommendations  SNF;Supervision/Assistance - 24 hour(depending upon 24/7 and progress after sx)    Equipment Recommendations  3 in 1 bedside commode(if she doesn't have)    Recommendations for Other Services       Precautions / Restrictions Precautions Precautions: Knee;Fall Restrictions Weight Bearing Restrictions: No      Mobility Bed Mobility Overal bed mobility: Needs Assistance Bed Mobility: Supine to Sit;Sit to Supine     Supine to sit: Mod assist;+2 for physical assistance;+2 for safety/equipment;HOB elevated Sit to supine: Mod assist;+2 for physical assistance;+2 for safety/equipment   General bed mobility comments: for support of the  right leg, support trunk, unable to use the left UE for  mobility out of bed.  Transfers Overall transfer level: Needs assistance   Transfers: Sit to/from Stand;Stand Pivot Transfers Sit to Stand: Mod assist;+2 physical assistance;+2 safety/equipment Stand pivot transfers: +2 safety/equipment;+2 physical assistance;Mod assist       General transfer comment: assist to rise from bed and steady. Small shuffle steps to  [facility] and assist to lower. Assit to rise from [facility] and pivot to BE with bear hug like  support.     Balance                                            ADL either performed or assessed with clinical judgement   ADL Overall ADL's : Needs assistance/impaired Eating/Feeding: Independent   Grooming: Set up   Upper Body Bathing: Minimal assistance   Lower Body Bathing: Maximal assistance;+2 for physical assistance   Upper Body Dressing : Moderate assistance   Lower Body Dressing: +2 for physical assistance;+2 for safety/equipment;Total assistance;Sit to/from stand   Toilet Transfer: Moderate assistance;+2 for physical assistance;+2 for safety/equipment;BSC;Stand-pivot   Toileting- Clothing Manipulation and Hygiene: Total assistance;Sit to/from stand;+2 for physical assistance;+2 for safety/equipment         General ADL Comments: pt's bed was wet due to purewick.  Transferred to 3:1 then back to bed     Vision         Perception     Praxis      Pertinent Vitals/Pain Pain Assessment: Faces Faces Pain Scale: Hurts even more Pain Location: right knee Pain Descriptors / Indicators: Aching Pain Intervention(s): Limited activity within patient's tolerance;Monitored during session;Premedicated before session;Repositioned     Hand Dominance Left   Extremity/Trunk Assessment Upper Extremity Assessment Upper Extremity Assessment: Generalized weakness;LUE deficits/detail LUE Deficits / Details: limited shoulder movement; h/o RTSA   Lower Extremity Assessment Lower Extremity Assessment: RLE deficits/detail RLE Deficits / Details: tolerates about 60 degrees  knee flexion during mobility, requires support to move  Cervical / Trunk Assessment Cervical / Trunk Assessment: (bruising L ribcage)   Communication Communication Communication: No difficulties   Cognition Arousal/Alertness: Awake/alert Behavior During Therapy: WFL for tasks assessed/performed Overall Cognitive Status: Within Functional Limits for tasks assessed                                  General Comments: tearful; having a bad day   General Comments       Exercises     Shoulder Instructions      Home Living Family/patient expects to be discharged to:: Private residence Living Arrangements: Children Available Help at Discharge: Family Type of Home: House Home Access: Stairs to enter Technical brewer of Steps: 4 Entrance Stairs-Rails: Left Home Layout: Two level;Bed/bath upstairs                   Additional Comments: has been staying on first level, sleeping on couch      Prior Functioning/Environment Level of Independence: Independent                 OT Problem List: Decreased strength;Decreased activity tolerance;Impaired balance (sitting and/or standing);Decreased knowledge of use of DME or AE;Decreased knowledge of precautions;Pain;Decreased range of motion      OT Treatment/Interventions: Self-care/ADL training;Therapeutic exercise;DME and/or AE instruction;Patient/family education;Balance training;Therapeutic activities    OT Goals(Current goals can be found in the care plan section) Acute Rehab OT Goals Patient Stated Goal: to get my knee fixed and not be in pain OT Goal Formulation: With patient Time For Goal Achievement: 10/27/17 Potential to Achieve Goals: Good ADL Goals Pt Will Perform Lower Body Bathing: with min assist;with adaptive equipment;sit to/from stand Pt Will Transfer to Toilet: with min assist;stand pivot transfer;bedside commode Pt Will Perform Toileting - Clothing Manipulation and hygiene: with mod assist;sitting/lateral leans;sit to/from stand Additional ADL Goal #1: pt will perform bed mobility with mod A +1 in preparation for adls  OT Frequency: Min 2X/week   Barriers to D/C:            Co-evaluation PT/OT/SLP Co-Evaluation/Treatment: Yes Reason for Co-Treatment: For patient/therapist safety PT goals addressed during session: Mobility/safety with mobility OT goals addressed during session: ADL's and  self-care      AM-PAC PT "6 Clicks" Daily Activity     Outcome Measure Help from another person eating meals?: None Help from another person taking care of personal grooming?: A Little Help from another person toileting, which includes using toliet, bedpan, or urinal?: A Lot Help from another person bathing (including washing, rinsing, drying)?: A Lot Help from another person to put on and taking off regular upper body clothing?: A Lot Help from another person to put on and taking off regular lower body clothing?: Total 6 Click Score: 14   End of Session    Activity Tolerance: Patient limited by fatigue;Patient limited by pain Patient left: in bed;with call bell/phone within reach;with bed alarm set;with nursing/sitter in room  OT Visit Diagnosis: Muscle weakness (generalized) (M62.81);Pain Pain - Right/Left: Right Pain - part of body: Knee                Time: 8841-6606 OT Time Calculation (min): 28 min Charges:  OT General Charges $OT Visit: 1 Visit OT Evaluation $OT Eval Low Complexity: 1 Low  Lesle Chris, OTR/L [phone] 10/13/2017  Dani Wallner 10/13/2017, 12:21 PM

## 2017-10-13 NOTE — Progress Notes (Signed)
ANTICOAGULATION CONSULT NOTE - Follow Up Consult  Pharmacy Consult for Heparin Indication: Aortic mechanical valve   Allergies  Allergen Reactions  . Hydromorphone Hcl Itching    "Dilaudid"  . Meperidine Hcl Itching  . Morphine Sulfate Itching  . Percocet [Oxycodone-Acetaminophen] Itching    Patient Measurements: Height: 5\' 2"  (157.5 cm) Weight: 138 lb 14.2 oz (63 kg) IBW/kg (Calculated) : 50.1 Heparin Dosing Weight: 55 kg  Vital Signs: Temp: 99.7 F (37.6 C) (08/24 0522) Temp Source: Oral (08/24 0522) BP: 141/72 (08/24 0522) Pulse Rate: 120 (08/24 0522)  Labs: Recent Labs    10/11/17 0456  10/11/17 1432  10/12/17 0619 10/12/17 1633 10/13/17 0514  HGB 9.7*  --   --   --  9.8*  --  9.8*  HCT 28.8*  --   --   --  28.9*  --  29.0*  PLT 110*  --   --   --  147*  --  143*  HEPARINUNFRC  --    < >  --    < > 0.42 0.37 0.40  CREATININE 1.00  --   --   --  0.78  --  0.73  CKTOTAL  --   --  269*  --   --   --   --    < > = values in this interval not displayed.   Estimated Creatinine Clearance: 52.2 mL/min (by C-G formula based on SCr of 0.73 mg/dL).  Medications:  Scheduled:  . buPROPion  150 mg Oral Daily  . docusate sodium  100 mg Oral BID  . ferrous sulfate  325 mg Oral Daily  . levothyroxine  100 mcg Oral QAC breakfast  . lidocaine  10 mL Intradermal Once  . losartan  100 mg Oral Daily  . predniSONE  20 mg Oral Q breakfast  . verapamil  120 mg Oral QHS   Infusions:  . sodium chloride 75 mL/hr at 10/13/17 0600  . sodium chloride Stopped (10/13/17 0548)  .  ceFAZolin (ANCEF) IV 200 mL/hr at 10/13/17 0600  . heparin 1,400 Units/hr (10/13/17 0716)  . rifampin (RIFADIN) IVPB Stopped (10/12/17 2339)   Assessment: 77 yo female with PMH of mechanical aortic vale replacement on chronic warfarin anticoagulation.  Admitted on 8/20 s/p fall with rhabdo, and with PMH of prosthetic TKR; found to have MSSA bacteremia and prosthetic joint infection.  Pharmacy was initially  consulted to dose warfarin inpatient, but was changed from warfarin to Heparin infusion due to drug-drug interaction with Rifampin.    Last INR 1.88 (on 8/21) was subtherapeutic, last warfarin dose 2.5mg  on 8/20.  Today, 10/13/2017: Heparin level remains therapeutic at 0.4 on 1400 units/hr.  CBC low but stable, no bleeding reported at this time.    Goal of Therapy:  Heparin level 0.3-0.7 units/ml Monitor platelets by anticoagulation protocol: Yes   Plan:  Continue heparin gtt at 1400 units/hr Monitor daily CBC, heparin level, s/s bleeding  Bertis Ruddy, PharmD Clinical Pharmacist Please check AMION for all [place] numbers 10/13/2017 8:20 AM

## 2017-10-14 ENCOUNTER — Inpatient Hospital Stay (HOSPITAL_COMMUNITY): Payer: Medicare Other

## 2017-10-14 LAB — BASIC METABOLIC PANEL
Anion gap: 3 — ABNORMAL LOW (ref 5–15)
BUN: 29 mg/dL — AB (ref 8–23)
CHLORIDE: 114 mmol/L — AB (ref 98–111)
CO2: 23 mmol/L (ref 22–32)
Calcium: 7.8 mg/dL — ABNORMAL LOW (ref 8.9–10.3)
Creatinine, Ser: 0.78 mg/dL (ref 0.44–1.00)
GFR calc Af Amer: >60 mL/min (ref >60)
GFR calc non Af Amer: >60 mL/min (ref >60)
Glucose, Bld: 84 mg/dL (ref 70–99)
POTASSIUM: 3.8 mmol/L (ref 3.5–5.1)
SODIUM: 140 mmol/L (ref 135–145)

## 2017-10-14 LAB — GLUCOSE, CAPILLARY: GLUCOSE-CAPILLARY: 70 mg/dL (ref 70–99)

## 2017-10-14 LAB — CBC
HEMATOCRIT: 25.9 % — AB (ref 36.0–46.0)
HEMOGLOBIN: 8.9 g/dL — AB (ref 12.0–15.0)
MCH: 32.5 pg (ref 26.0–34.0)
MCHC: 34.4 g/dL (ref 30.0–36.0)
MCV: 94.5 fL (ref 78.0–100.0)
Platelets: 144 10*3/uL — ABNORMAL LOW (ref 150–400)
RBC: 2.74 MIL/uL — ABNORMAL LOW (ref 3.87–5.11)
RDW: 17.6 % — ABNORMAL HIGH (ref 11.5–15.5)
WBC: 18.8 10*3/uL — ABNORMAL HIGH (ref 4.0–10.5)

## 2017-10-14 LAB — HEPARIN LEVEL (UNFRACTIONATED): Heparin Unfractionated: 0.55 IU/mL (ref 0.30–0.70)

## 2017-10-14 MED ORDER — BISACODYL 5 MG PO TBEC
5.0000 mg | DELAYED_RELEASE_TABLET | Freq: Once | ORAL | Status: AC
  Administered 2017-10-14: 5 mg via ORAL
  Filled 2017-10-14: qty 1

## 2017-10-14 MED ORDER — LIP MEDEX EX OINT
TOPICAL_OINTMENT | CUTANEOUS | Status: AC
  Administered 2017-10-14: 12:00:00
  Filled 2017-10-14: qty 7

## 2017-10-14 MED ORDER — PREDNISONE 10 MG PO TABS
10.0000 mg | ORAL_TABLET | Freq: Every day | ORAL | Status: DC
  Administered 2017-10-14: 10 mg via ORAL
  Filled 2017-10-14: qty 1

## 2017-10-14 MED ORDER — HYDRALAZINE HCL 20 MG/ML IJ SOLN
10.0000 mg | Freq: Four times a day (QID) | INTRAMUSCULAR | Status: DC | PRN
  Administered 2017-10-14: 10 mg via INTRAVENOUS
  Filled 2017-10-14: qty 1

## 2017-10-14 NOTE — Progress Notes (Signed)
PROGRESS NOTE    Paula Ferguson  IRW:[id number] DOB: 1941-01-10 DOA: 10/09/2017 PCP: Marletta Lor, MD   Brief Narrative:  HPI On 10/09/2017 by Dr. Raiford Noble Paula Ferguson is a 77 y.o. female with medical history significant of CAD s/p CABG x1 and Hx of AVR, HTN, HLD, Hypothyroidism, Anemia, Depression and other comorbidites who presents with worsened Knee pain and a fall.  She has had had Knee Pain Bilateral for at least 6 years and acutely worsened yesterday morning and specifically in her Right Knee. She described it as a stabbing pain. Knee pain is a 10/10 No radiation.  She states that because of her fall she lay on the floor for the whole night because her daughter was unable to pick her up and she refused called EMS.  Daughter family stated that she is calling EMS and they brought her to the emergency room for further evaluation.  He also complained of +Lightheadedness, Dizziness, Weakness and progressively gotten worse the last few months and is more fatigued and exhausted. No CP, Abdominal, No N/V/Diarrhe, no Burning Discomfort but has had increased frequency.  Initial evaluation in the ED showed that she had negative x-rays however she is unable to ambulate and was concerned that she would fall if she even attempted.  TRH was called to admit this patient for her right knee pain and fall as well as mild rhabdomyolysis.  Interim history Admitted for right knee pain and swelling after fall.  Found to have mild rhabdo.  Also noted to have MSSA bacteremia.  Infectious disease consulted.  Ortho consulted.  Cardiology consulted s/p TEE. Pending ortho surgery.  Assessment & Plan   MSSA bacteremia with leukocytosis -Initially admitted with WBC of 14.2, was afebrile at that time -Suspect source is right prosthetic knee -Blood cultures showed MSSA -Infectious disease consulted and appreciated -Continue Ancef and rifampin  -Repeat blood cultures on 10/11/2017 show no growth to  date -Cardiology consulted, status post TEE, no vegetations noted -leukocytosis worsening, however likely reactive to steroids-currently weaning -continue to monitor CBC  Intractable right knee pain with swelling secondary to fall and infection -Orthopedics consulted and appreciated -Right knee x-ray showed no acute fracture or dislocation. -Right hip x-ray showed no evident acute fracture dislocation -CT of the right knee: No acute osseous injury of the right knee.  Right total knee arthroplasty with a thin lucency around the tibial stem component of the arthroplasty measuring less than 1 mm concerning for loosening.  Large right joint effusion with synovial thickening. -Patient was started on prednisone for possible gout flare.  Although she tells me she has no history of gout. Will wean prednisone. -PT recommending SNF versus home health pending operative course -s/p right knee aspiration, Culture: moderate staph auerus. Gram stain shows abundant GPC -Orthopedics recommending surgery and antibiotic spacer  Acute rhabdomyolysis -In the setting of fall and trauma -CK on admission 1738, down to 269 -will hold IVF for now  Dizziness/lightheadedness/generalized weakness -Has been occurring over the last several months and worsening with postural changes -TSH 2.573 -UA unremarkable  Coronary artery disease/aortic valve replacement -Currently chest pain-free -Continue losartan, verapamil -Coumadin now held given that infectious disease started rifampin -Continue heparin  -Statin held given rhabdo  Essential hypertension -Continue losartan, verapamil  Hyperlipidemia -Statin held due to rhabdomyolysis  Depression -Continue bupropion  Normocytic anemia -hemoglobin currently 8.9, baseline appears to be approximately 11-12 -Suspect drop in hemoglobin secondary to dilutional component as patient is receiving IV fluids -Continue iron  supplementation -Continue to monitor CBC    Hypothyroidism -Continue Synthroid -TSH 2.573  Hypokalemia -Resolved with replacement -Magnesium 1.8 -Continue to monitor   Constipation -Will add Dulcolax Ferguson and continue to monitor -Suspect secondary to immobility and pain medications  DVT Prophylaxis   heparin  Code Status: Full  Family Communication: None at bedside  Disposition Plan: Admitted. Pending extraction of right knee prosthesis and placement of antibiotic spacer by orthopedic surgery sometime next week.  Disposition pending.  Consultants Orthopedics Infectious disease Cardiology  Procedures  None  Antibiotics   Anti-infectives (From admission, onward)   Start     Dose/Rate Route Frequency Ordered Stop   10/11/17 1000  rifampin (RIFADIN) 300 mg in sodium chloride 0.9 % 100 mL IVPB     300 mg 200 mL/hr over 30 Minutes Intravenous Every 12 hours 10/10/17 1603     10/10/17 1200  ceFAZolin (ANCEF) IVPB 2g/100 mL premix     2 g 200 mL/hr over 30 Minutes Intravenous Every 8 hours 10/10/17 1111        Subjective:   Paula Ferguson seen and examined today.  Feels better this morning. Feels some pain in her left arm but is trying to move her arm around.  Denies current chest pain, shortness of breath, abdominal pain, nausea vomiting, diarrhea, dizziness or headache.  Does complain of constipation.  Objective:   Vitals:   10/13/17 1344 10/13/17 2042 10/13/17 2200 10/14/17 0634  BP: 128/67 124/70  (!) 147/72  Pulse: (!) 101 (!) 101  96  Resp: 13 18  18   Temp: 99.2 F (37.3 C) 98.9 F (37.2 C)  98.9 F (37.2 C)  TempSrc: Oral Oral  Oral  SpO2: 97% 98%  98%  Weight:   64.6 kg   Height:        Intake/Output Summary (Last 24 hours) at 10/14/2017 1243 Last data filed at 10/14/2017 0900 Gross per 24 hour  Intake 2962.77 ml  Output 300 ml  Net 2662.77 ml   Filed Weights   10/10/17 0626 10/12/17 0500 10/13/17 2200  Weight: 62.5 kg 63 kg 64.6 kg   Exam  General: Well developed, well nourished,  NAD, appears stated age  47: NCAT, mucous membranes moist.   Neck: Supple  Cardiovascular: S1 S2 auscultated, RRR, no murmur  Respiratory: Clear to auscultation bilaterally with equal chest rise  Abdomen: Soft, nontender, nondistended, + bowel sounds  Extremities: warm dry without cyanosis clubbing or edema  Neuro: AAOx3, nonfocal  Skin: Without rashes exudates or nodules, ecchymosis on left upper extremity  Psych: does not, appropriate mood and affect  Data Reviewed: I have personally reviewed following labs and imaging studies  CBC: Recent Labs  Lab 10/09/17 0922 10/10/17 0538 10/11/17 0456 10/12/17 0619 10/13/17 0514 10/14/17 0451  WBC 14.2* 8.4 9.8 9.9 14.2* 18.8*  NEUTROABS 12.0*  --   --   --   --   --   HGB 11.4* 10.5* 9.7* 9.8* 9.8* 8.9*  HCT 34.2* 31.7* 28.8* 28.9* 29.0* 25.9*  MCV 96.3 96.4 96.0 94.4 94.2 94.5  PLT 153 140* 110* 147* 143* 662*   Basic Metabolic Panel: Recent Labs  Lab 10/09/17 0922 10/10/17 0538 10/11/17 0456 10/12/17 0619 10/13/17 0514 10/13/17 1311 10/14/17 0451  NA 140 139 135 137 139  --  140  K 3.8 4.1 4.1 3.5 3.1*  --  3.8  CL 105 108 104 109 111  --  114*  CO2 25 26 23 24 23   --  23  GLUCOSE 92 99 94 96 83  --  84  BUN 21 25* 29* 30* 27*  --  29*  CREATININE 0.79 0.77 1.00 0.78 0.73  --  0.78  CALCIUM 9.0 8.6* 8.4* 8.3* 8.1*  --  7.8*  MG 1.5*  --   --   --   --  1.8  --   PHOS 2.6  --   --   --   --   --   --    GFR: Estimated Creatinine Clearance: 52.8 mL/min (by C-G formula based on SCr of 0.78 mg/dL). Liver Function Tests: Recent Labs  Lab 10/10/17 0538  AST 73*  ALT 29  ALKPHOS 70  BILITOT 2.1*  PROT 6.2*  ALBUMIN 2.3*   No results for input(s): LIPASE, AMYLASE in the last 168 hours. No results for input(s): AMMONIA in the last 168 hours. Coagulation Profile: Recent Labs  Lab 10/09/17 0922 10/10/17 0538  INR 1.92 1.88   Cardiac Enzymes: Recent Labs  Lab 10/09/17 0922 10/11/17 1432  10/13/17 1311  CKTOTAL 1,802*  1,793* 269* 70  CKMB 10.5*  --   --    BNP (last 3 results) No results for input(s): PROBNP in the last 8760 hours. HbA1C: No results for input(s): HGBA1C in the last 72 hours. CBG: Recent Labs  Lab 10/10/17 0734 10/11/17 0806 10/12/17 0734 10/13/17 0749 10/14/17 0746  GLUCAP 81 91 76 99 70   Lipid Profile: No results for input(s): CHOL, HDL, LDLCALC, TRIG, CHOLHDL, LDLDIRECT in the last 72 hours. Thyroid Function Tests: No results for input(s): TSH, T4TOTAL, FREET4, T3FREE, THYROIDAB in the last 72 hours. Anemia Panel: No results for input(s): VITAMINB12, FOLATE, FERRITIN, TIBC, IRON, RETICCTPCT in the last 72 hours. Urine analysis:    Component Value Date/Time   COLORURINE AMBER (A) 10/10/2017 0218   APPEARANCEUR CLEAR 10/10/2017 0218   LABSPEC 1.027 10/10/2017 0218   PHURINE 5.0 10/10/2017 0218   GLUCOSEU NEGATIVE 10/10/2017 0218   HGBUR SMALL (A) 10/10/2017 0218   HGBUR negative 04/09/2008 1252   BILIRUBINUR NEGATIVE 10/10/2017 0218   BILIRUBINUR n 03/01/2017 1544   KETONESUR NEGATIVE 10/10/2017 0218   PROTEINUR NEGATIVE 10/10/2017 0218   UROBILINOGEN 1.0 03/01/2017 1544   UROBILINOGEN 1.0 03/14/2011 1520   NITRITE NEGATIVE 10/10/2017 0218   LEUKOCYTESUR NEGATIVE 10/10/2017 0218   Sepsis Labs: @LABRCNTIP (procalcitonin:4,lacticidven:4)  ) Recent Results (from the past 240 hour(s))  Culture, blood (routine x 2)     Status: Abnormal   Collection Time: 10/09/17 12:29 PM  Result Value Ref Range Status   Specimen Description   Final    BLOOD RIGHT ANTECUBITAL Performed at [facility], [place] [address] [place], [place] [address]    Special Requests   Final    BOTTLES DRAWN AEROBIC AND ANAEROBIC Blood Culture results may not be optimal due to an inadequate volume of blood received in culture bottles Performed at [facility] [address], [place], [place] [address]    Culture  Setup Time    Final    GRAM POSITIVE COCCI IN BOTH AEROBIC AND ANAEROBIC BOTTLES CRITICAL VALUE NOTED.  VALUE IS CONSISTENT WITH PREVIOUSLY REPORTED AND CALLED VALUE.    Culture (A)  Final    STAPHYLOCOCCUS AUREUS SUSCEPTIBILITIES PERFORMED ON PREVIOUS CULTURE WITHIN THE LAST 5 DAYS. Performed at [facility], [place] [address] [place], [place] [address]    Report Status 10/12/2017 FINAL  Final  Culture, blood (routine x 2)     Status:  Abnormal   Collection Time: 10/09/17 12:34 PM  Result Value Ref Range Status   Specimen Description   Final    BLOOD LEFT ANTECUBITAL Performed at [facility] [address] [place], [place] [address]    Special Requests   Final    BOTTLES DRAWN AEROBIC AND ANAEROBIC Blood Culture adequate volume Performed at [facility] [address] [place], [place] [address]    Culture  Setup Time   Final    GRAM POSITIVE COCCI IN CLUSTERS IN BOTH AEROBIC AND ANAEROBIC BOTTLES CRITICAL RESULT CALLED TO, READ BACK BY AND VERIFIED WITH: Karel Jarvis PharmD 9:50 10/10/17 ([email or web address]) Performed at [facility], [place] [address], [place], [place] [address]    Culture STAPHYLOCOCCUS AUREUS (A)  Final   Report Status 10/12/2017 FINAL  Final   Organism ID, Bacteria STAPHYLOCOCCUS AUREUS  Final      Susceptibility   Staphylococcus aureus - MIC*    CIPROFLOXACIN <=0.5 SENSITIVE Sensitive     ERYTHROMYCIN >=8 RESISTANT Resistant     GENTAMICIN <=0.5 SENSITIVE Sensitive     OXACILLIN 0.5 SENSITIVE Sensitive     TETRACYCLINE <=1 SENSITIVE Sensitive     VANCOMYCIN <=0.5 SENSITIVE Sensitive     TRIMETH/SULFA <=10 SENSITIVE Sensitive     CLINDAMYCIN <=0.25 SENSITIVE Sensitive     RIFAMPIN <=0.5 SENSITIVE Sensitive     Inducible Clindamycin NEGATIVE Sensitive     * STAPHYLOCOCCUS AUREUS  Blood Culture ID Panel (Reflexed)     Status: Abnormal   Collection Time: 10/09/17 12:34 PM  Result Value Ref Range Status   Enterococcus species NOT  DETECTED NOT DETECTED Final   Listeria monocytogenes NOT DETECTED NOT DETECTED Final   Staphylococcus species DETECTED (A) NOT DETECTED Final    Comment: CRITICAL RESULT CALLED TO, READ BACK BY AND VERIFIED WITH: Karel Jarvis PharmD 9:50 10/10/17 ([email or web address])    Staphylococcus aureus DETECTED (A) NOT DETECTED Final    Comment: Methicillin (oxacillin) susceptible Staphylococcus aureus (MSSA). Preferred therapy is anti staphylococcal beta lactam antibiotic (Cefazolin or Nafcillin), unless clinically contraindicated. CRITICAL RESULT CALLED TO, READ BACK BY AND VERIFIED WITH: Karel Jarvis PharmD 9:50 10/10/17 ([email or web address])    Methicillin resistance NOT DETECTED NOT DETECTED Final   Streptococcus species NOT DETECTED NOT DETECTED Final   Streptococcus agalactiae NOT DETECTED NOT DETECTED Final   Streptococcus pneumoniae NOT DETECTED NOT DETECTED Final   Streptococcus pyogenes NOT DETECTED NOT DETECTED Final   Acinetobacter baumannii NOT DETECTED NOT DETECTED Final   Enterobacteriaceae species NOT DETECTED NOT DETECTED Final   Enterobacter cloacae complex NOT DETECTED NOT DETECTED Final   Escherichia coli NOT DETECTED NOT DETECTED Final   Klebsiella oxytoca NOT DETECTED NOT DETECTED Final   Klebsiella pneumoniae NOT DETECTED NOT DETECTED Final   Proteus species NOT DETECTED NOT DETECTED Final   Serratia marcescens NOT DETECTED NOT DETECTED Final   Haemophilus influenzae NOT DETECTED NOT DETECTED Final   Neisseria meningitidis NOT DETECTED NOT DETECTED Final   Pseudomonas aeruginosa NOT DETECTED NOT DETECTED Final   Candida albicans NOT DETECTED NOT DETECTED Final   Candida glabrata NOT DETECTED NOT DETECTED Final   Candida krusei NOT DETECTED NOT DETECTED Final   Candida parapsilosis NOT DETECTED NOT DETECTED Final   Candida tropicalis NOT DETECTED NOT DETECTED Final    Comment: Performed at [facility], [address] [place], [place] [address]  Urine culture     Status: Abnormal   Collection  Time: 10/10/17  2:18 AM  Result Value Ref Range Status   Specimen Description   Final    URINE, RANDOM Performed at [facility] [address] [place], [place] [address]    Special Requests   Final    NONE Performed at [facility], [place] [address], [place], [place] [address]    Culture MULTIPLE SPECIES PRESENT, SUGGEST RECOLLECTION (A)  Final   Report Status 10/12/2017 FINAL  Final  Aerobic/Anaerobic Culture (surgical/deep wound)     Status: None (Preliminary result)   Collection Time: 10/10/17  5:45 PM  Result Value Ref Range Status   Specimen Description   Final    SYNOVIAL Performed at [facility] [address], [place], [place] [address]    Special Requests   Final    NONE Performed at [facility], [place] [address] [place], [place] [address]    Gram Stain   Final    ABUNDANT WBC PRESENT, PREDOMINANTLY PMN ABUNDANT GRAM POSITIVE COCCI IN PAIRS IN CHAINS IN CLUSTERS Gram Stain Report Called to,Read Back By and Verified With: S.WRIGHT AT 1929 ON 10/10/17 BY N.THOMPSON Performed at [facility], [place] [address], [place], [place] [address]    Culture   Final    MODERATE STAPHYLOCOCCUS AUREUS NO ANAEROBES ISOLATED; CULTURE IN PROGRESS FOR 5 DAYS    Report Status PENDING  Incomplete   Organism ID, Bacteria STAPHYLOCOCCUS AUREUS  Final      Susceptibility   Staphylococcus aureus - MIC*    CIPROFLOXACIN <=0.5 SENSITIVE Sensitive     ERYTHROMYCIN >=8 RESISTANT Resistant     GENTAMICIN <=0.5 SENSITIVE Sensitive     OXACILLIN 0.5 SENSITIVE Sensitive     TETRACYCLINE <=1 SENSITIVE Sensitive     VANCOMYCIN 1 SENSITIVE Sensitive     TRIMETH/SULFA <=10 SENSITIVE Sensitive     CLINDAMYCIN <=0.25 SENSITIVE Sensitive     RIFAMPIN <=0.5 SENSITIVE Sensitive     Inducible Clindamycin NEGATIVE Sensitive     * MODERATE STAPHYLOCOCCUS AUREUS  Culture, blood (routine x 2)     Status: None  (Preliminary result)   Collection Time: 10/11/17  1:09 PM  Result Value Ref Range Status   Specimen Description   Final    BLOOD RIGHT ANTECUBITAL Performed at [facility] [place] [address], [place], [place] [address]    Special Requests   Final    BOTTLES DRAWN AEROBIC AND ANAEROBIC Blood Culture adequate volume Performed at [facility] [address] [place], [place] [address]    Culture   Final    NO GROWTH 2 DAYS Performed at [facility] [address], [place], [place] [address]    Report Status PENDING  Incomplete  Culture, blood (routine x 2)     Status: None (Preliminary result)   Collection Time: 10/11/17  1:13 PM  Result Value Ref Range Status   Specimen Description   Final    BLOOD BLOOD RIGHT FOREARM Performed at [facility] [address] [place], [place] [address]    Special Requests   Final    BOTTLES DRAWN AEROBIC AND ANAEROBIC Blood Culture adequate volume Performed at [facility] [address], [place], [place] [address]    Culture   Final    NO GROWTH 2 DAYS Performed at [facility] [address] [place], [place] [address]    Report Status PENDING  Incomplete      Radiology Studies: No results  found.   Scheduled Meds: . buPROPion  150 mg Oral Ferguson  . docusate sodium  100 mg Oral BID  . ferrous sulfate  325 mg Oral Ferguson  . levothyroxine  100 mcg Oral QAC breakfast  . lidocaine  10 mL Intradermal Once  . losartan  100 mg Oral Ferguson  . predniSONE  10 mg Oral Q breakfast  . verapamil  120 mg Oral QHS   Continuous Infusions: . sodium chloride 75 mL/hr at 10/14/17 0936  . sodium chloride Stopped (10/13/17 2150)  .  ceFAZolin (ANCEF) IV 2 g (10/14/17 1232)  . heparin 1,400 Units/hr (10/14/17 0155)  . rifampin (RIFADIN) IVPB 300 mg (10/14/17 0936)     LOS: 4 days   Time Spent in minutes   30 minutes  Kendra Woolford D.O. on 10/14/2017 at 12:43  PM  Between 7am to 7pm - Please see pager noted on [email or web address]  After 7pm go to [email or web address]  And look for the night coverage person covering for me after hours  [facility] Office  [phone]

## 2017-10-14 NOTE — Progress Notes (Signed)
ANTICOAGULATION CONSULT NOTE - Follow Up Consult  Pharmacy Consult for Heparin Indication: Aortic mechanical valve   Allergies  Allergen Reactions  . Hydromorphone Hcl Itching    "Dilaudid"  . Meperidine Hcl Itching  . Morphine Sulfate Itching  . Percocet [Oxycodone-Acetaminophen] Itching    Patient Measurements: Height: 5\' 2"  (157.5 cm) Weight: 142 lb 6.4 oz (64.6 kg) IBW/kg (Calculated) : 50.1 Heparin Dosing Weight: 55 kg  Vital Signs: Temp: 98.9 F (37.2 C) (08/25 0634) Temp Source: Oral (08/25 0634) BP: 147/72 (08/25 0634) Pulse Rate: 96 (08/25 0634)  Labs: Recent Labs    10/11/17 1432  10/12/17 0619 10/12/17 1633 10/13/17 0514 10/13/17 1311 10/14/17 0451  HGB  --    < > 9.8*  --  9.8*  --  8.9*  HCT  --   --  28.9*  --  29.0*  --  25.9*  PLT  --   --  147*  --  143*  --  144*  HEPARINUNFRC  --    < > 0.42 0.37 0.40  --  0.55  CREATININE  --   --  0.78  --  0.73  --  0.78  CKTOTAL 269*  --   --   --   --  70  --    < > = values in this interval not displayed.   Estimated Creatinine Clearance: 52.8 mL/min (by C-G formula based on SCr of 0.78 mg/dL).  Medications:  Scheduled:  . buPROPion  150 mg Oral Daily  . docusate sodium  100 mg Oral BID  . ferrous sulfate  325 mg Oral Daily  . levothyroxine  100 mcg Oral QAC breakfast  . lidocaine  10 mL Intradermal Once  . losartan  100 mg Oral Daily  . predniSONE  10 mg Oral Q breakfast  . verapamil  120 mg Oral QHS   Infusions:  . sodium chloride 75 mL/hr at 10/13/17 2200  . sodium chloride Stopped (10/13/17 2150)  .  ceFAZolin (ANCEF) IV 2 g (10/14/17 0447)  . heparin 1,400 Units/hr (10/14/17 0155)  . rifampin (RIFADIN) IVPB 200 mL/hr at 10/13/17 2200   Assessment: 77 yo female with PMH of mechanical aortic vale replacement on chronic warfarin anticoagulation.  Admitted on 8/20 s/p fall with rhabdo, and with PMH of prosthetic TKR; found to have MSSA bacteremia and prosthetic joint infection.  Pharmacy was  initially consulted to dose warfarin inpatient, but was changed from warfarin to Heparin infusion due to drug-drug interaction with Rifampin.    Last INR 1.88 (on 8/21) was subtherapeutic, last warfarin dose 2.5mg  on 8/20.  Today, 10/14/2017: Heparin level remains therapeutic at 0.55 on 1400 units/hr.  CBC remains low but stable and no bleeding reported.    Goal of Therapy:  Heparin level 0.3-0.7 units/ml Monitor platelets by anticoagulation protocol: Yes   Plan:  Heparin gtt at 1400 units/hr Daily CBC, heparin level, s/s bleeding F/u long-term anticoagulation plan  Bertis Ruddy, PharmD Clinical Pharmacist Please check AMION for all [facility] numbers 10/14/2017 7:41 AM

## 2017-10-14 NOTE — Progress Notes (Signed)
   Subjective: 2 Days Post-Op Procedure(s) (LRB): TRANSESOPHAGEAL ECHOCARDIOGRAM (TEE) (N/A)  Right knee sepsis Plan for surgery either tomorrow or Tuesday with Dr. Alvan Dame Denies any new symptoms or issues Otherwise stable Patient reports pain as mild.  Objective:   VITALS:   Vitals:   10/13/17 2042 10/14/17 0634  BP: 124/70 (!) 147/72  Pulse: (!) 101 96  Resp: 18 18  Temp: 98.9 F (37.2 C) 98.9 F (37.2 C)  SpO2: 98% 98%    Right knee with small effusion nv intact distally Mild pain with rom and flexion No rashes or edema  LABS Recent Labs    10/12/17 0619 10/13/17 0514 10/14/17 0451  HGB 9.8* 9.8* 8.9*  HCT 28.9* 29.0* 25.9*  WBC 9.9 14.2* 18.8*  PLT 147* 143* 144*    Recent Labs    10/12/17 0619 10/13/17 0514 10/14/17 0451  NA 137 139 140  K 3.5 3.1* 3.8  BUN 30* 27* 29*  CREATININE 0.78 0.73 0.78  GLUCOSE 96 83 84     Assessment/Plan: 2 Days Post-Op Procedure(s) (LRB): TRANSESOPHAGEAL ECHOCARDIOGRAM (TEE) (N/A) Plan for surgery with Dr. Alvan Dame either tomorrow or Tuesday NPO after midnight tonight Continue current treatment and pain management    Brad Luna [place], [place] [facility] now [facility]  [facility] [address], [place], [place], [place] [address] Phone: [phone] [email or web address] Facebook  [employer]

## 2017-10-14 NOTE — Plan of Care (Signed)
Pt resting in bed with c/o pain in right leg/knee. Pain med given. A&O; no issues or concerns voiced. Will continue to monitor.

## 2017-10-15 LAB — CBC
HCT: 24.7 % — ABNORMAL LOW (ref 36.0–46.0)
Hemoglobin: 8.5 g/dL — ABNORMAL LOW (ref 12.0–15.0)
MCH: 32.1 pg (ref 26.0–34.0)
MCHC: 34.4 g/dL (ref 30.0–36.0)
MCV: 93.2 fL (ref 78.0–100.0)
PLATELETS: 177 10*3/uL (ref 150–400)
RBC: 2.65 MIL/uL — AB (ref 3.87–5.11)
RDW: 17.8 % — ABNORMAL HIGH (ref 11.5–15.5)
WBC: 24.2 10*3/uL — ABNORMAL HIGH (ref 4.0–10.5)

## 2017-10-15 LAB — AEROBIC/ANAEROBIC CULTURE (SURGICAL/DEEP WOUND)

## 2017-10-15 LAB — DIFFERENTIAL
Basophils Absolute: 0 10*3/uL (ref 0.0–0.1)
Basophils Relative: 0 %
EOS PCT: 1 %
Eosinophils Absolute: 0.2 10*3/uL (ref 0.0–0.7)
LYMPHS PCT: 4 %
Lymphs Abs: 1 10*3/uL (ref 0.7–4.0)
MONOS PCT: 12 %
Monocytes Absolute: 2.9 10*3/uL — ABNORMAL HIGH (ref 0.1–1.0)
NEUTROS ABS: 20.1 10*3/uL — AB (ref 1.7–7.7)
NEUTROS PCT: 83 %
nRBC: 1 /100 WBC — ABNORMAL HIGH

## 2017-10-15 LAB — GLUCOSE, CAPILLARY
GLUCOSE-CAPILLARY: 66 mg/dL — AB (ref 70–99)
GLUCOSE-CAPILLARY: 89 mg/dL (ref 70–99)

## 2017-10-15 LAB — BILIRUBIN, FRACTIONATED(TOT/DIR/INDIR)
BILIRUBIN DIRECT: 3.2 mg/dL — AB (ref 0.0–0.2)
BILIRUBIN TOTAL: 5 mg/dL — AB (ref 0.3–1.2)
Indirect Bilirubin: 1.8 mg/dL — ABNORMAL HIGH (ref 0.3–0.9)

## 2017-10-15 LAB — COMPREHENSIVE METABOLIC PANEL
ALK PHOS: 79 U/L (ref 38–126)
ALT: 6 U/L (ref 0–44)
ANION GAP: 6 (ref 5–15)
AST: 28 U/L (ref 15–41)
Albumin: 1.7 g/dL — ABNORMAL LOW (ref 3.5–5.0)
BUN: 29 mg/dL — ABNORMAL HIGH (ref 8–23)
CALCIUM: 7.6 mg/dL — AB (ref 8.9–10.3)
CO2: 21 mmol/L — AB (ref 22–32)
Chloride: 110 mmol/L (ref 98–111)
Creatinine, Ser: 0.67 mg/dL (ref 0.44–1.00)
GFR calc non Af Amer: >60 mL/min (ref >60)
Glucose, Bld: 63 mg/dL — ABNORMAL LOW (ref 70–99)
Potassium: 3.6 mmol/L (ref 3.5–5.1)
SODIUM: 137 mmol/L (ref 135–145)
Total Bilirubin: 4.1 mg/dL — ABNORMAL HIGH (ref 0.3–1.2)
Total Protein: 5 g/dL — ABNORMAL LOW (ref 6.5–8.1)

## 2017-10-15 LAB — PROTIME-INR
INR: 3.34
Prothrombin Time: 33.6 seconds — ABNORMAL HIGH (ref 11.4–15.2)

## 2017-10-15 LAB — HEPARIN LEVEL (UNFRACTIONATED): HEPARIN UNFRACTIONATED: 0.47 [IU]/mL (ref 0.30–0.70)

## 2017-10-15 LAB — AEROBIC/ANAEROBIC CULTURE W GRAM STAIN (SURGICAL/DEEP WOUND)

## 2017-10-15 LAB — LACTATE DEHYDROGENASE: LDH: 293 U/L — ABNORMAL HIGH (ref 98–192)

## 2017-10-15 MED ORDER — DEXTROSE 50 % IV SOLN
INTRAVENOUS | Status: AC
  Filled 2017-10-15: qty 50

## 2017-10-15 MED ORDER — DEXTROSE 50 % IV SOLN
25.0000 mL | Freq: Once | INTRAVENOUS | Status: AC
  Administered 2017-10-15: 25 mL via INTRAVENOUS

## 2017-10-15 MED ORDER — VITAMIN K1 10 MG/ML IJ SOLN
1.0000 mg | Freq: Once | INTRAVENOUS | Status: AC
  Administered 2017-10-15: 1 mg via INTRAVENOUS
  Filled 2017-10-15: qty 0.1

## 2017-10-15 NOTE — Care Management Important Message (Signed)
Important Message  Patient Details  Name: MARGRETTA ZAMORANO MRN: [id number] Date of Birth: March 14, 1940   Medicare Important Message Given:  Yes    Kerin Salen 10/15/2017, 11:09 AMImportant Message  Patient Details  Name: MINDIE RAWDON MRN: [id number] Date of Birth: 1940-08-20   Medicare Important Message Given:  Yes    Kerin Salen 10/15/2017, 11:09 AM

## 2017-10-15 NOTE — Progress Notes (Signed)
ANTICOAGULATION CONSULT NOTE - Follow Up Consult  Pharmacy Consult for Heparin Indication: Aortic mechanical valve   Allergies  Allergen Reactions  . Hydromorphone Hcl Itching    "Dilaudid"  . Meperidine Hcl Itching  . Morphine Sulfate Itching  . Percocet [Oxycodone-Acetaminophen] Itching    Patient Measurements: Height: 5\' 2"  (157.5 cm) Weight: 142 lb 7 oz (64.6 kg) IBW/kg (Calculated) : 50.1 Heparin Dosing Weight: 55 kg  Vital Signs: Temp: 98.8 F (37.1 C) (08/25 2042) Temp Source: Oral (08/25 2042) BP: 172/85 (08/25 2042) Pulse Rate: 108 (08/25 2042)  Labs: Recent Labs    10/13/17 0514 10/13/17 1311 10/14/17 0451 10/15/17 0409  HGB 9.8*  --  8.9* 8.5*  HCT 29.0*  --  25.9* 24.7*  PLT 143*  --  144* 177  LABPROT  --   --   --  33.6*  INR  --   --   --  3.34  HEPARINUNFRC 0.40  --  0.55 0.47  CREATININE 0.73  --  0.78 0.67  CKTOTAL  --  70  --   --    Estimated Creatinine Clearance: 52.8 mL/min (by C-G formula based on SCr of 0.67 mg/dL).  Medications:  Scheduled:  . buPROPion  150 mg Oral Daily  . docusate sodium  100 mg Oral BID  . ferrous sulfate  325 mg Oral Daily  . levothyroxine  100 mcg Oral QAC breakfast  . lidocaine  10 mL Intradermal Once  . losartan  100 mg Oral Daily  . verapamil  120 mg Oral QHS   Infusions:  . sodium chloride Stopped (10/13/17 2150)  .  ceFAZolin (ANCEF) IV Stopped (10/15/17 0503)  . heparin 1,400 Units/hr (10/15/17 0600)  . rifampin (RIFADIN) IVPB Stopped (10/14/17 2322)   Assessment: 78 yo female with PMH of mechanical aortic vale replacement on chronic warfarin anticoagulation.  Admitted on 8/20 s/p fall with rhabdo, and with PMH of prosthetic TKR; found to have MSSA bacteremia and prosthetic joint infection.  Pharmacy was initially consulted to dose warfarin inpatient, but was changed from warfarin to Heparin infusion due to drug-drug interaction with Rifampin.    Last INR 1.88 (on 8/21) was subtherapeutic, last  warfarin dose 2.5mg  on 8/20.  Today, 10/15/2017:  Heparin level remains therapeutic at 0.47 on 1400 units/hr.  CBC remains low but stable and no bleeding reported.    INR today is 3.34. Last warfarin dose was on 8/20. Spoke to MD, concerned about pt not being able to go to OR as pt needs to have infected hardware replaced.   Goal of Therapy:  Heparin level 0.3-0.7 units/ml Monitor platelets by anticoagulation protocol: Yes   Plan:   Hold heparin as INR is > 3  Vitamin K 1 mg IV x 1 in anticipation of surgery  F/u plans for surgery/anticoagulation    Royetta Asal, PharmD, BCPS Pager [phone] 10/15/2017 8:42 AM

## 2017-10-15 NOTE — Progress Notes (Signed)
PROGRESS NOTE    Paula Ferguson  WJX:[id number] DOB: 1940-04-27 DOA: 10/09/2017 PCP: Marletta Lor, MD   Brief Narrative:  HPI On 10/09/2017 by Dr. Raiford Noble Paula Ferguson is a 77 y.o. female with medical history significant of CAD s/p CABG x1 and Hx of AVR, HTN, HLD, Hypothyroidism, Anemia, Depression and other comorbidites who presents with worsened Knee pain and a fall.  She has had had Knee Pain Bilateral for at least 6 years and acutely worsened yesterday morning and specifically in her Right Knee. She described it as a stabbing pain. Knee pain is a 10/10 No radiation.  She states that because of her fall she lay on the floor for the whole night because her daughter was unable to pick her up and she refused called EMS.  Daughter family stated that she is calling EMS and they brought her to the emergency room for further evaluation.  He also complained of +Lightheadedness, Dizziness, Weakness and progressively gotten worse the last few months and is more fatigued and exhausted. No CP, Abdominal, No N/V/Diarrhe, no Burning Discomfort but has had increased frequency.  Initial evaluation in the ED showed that she had negative x-rays however she is unable to ambulate and was concerned that she would fall if she even attempted.  TRH was called to admit this patient for her right knee pain and fall as well as mild rhabdomyolysis.  Interim history Admitted for right knee pain and swelling after fall.  Found to have mild rhabdo.  Also noted to have MSSA bacteremia.  Infectious disease consulted.  Ortho consulted.  Cardiology consulted s/p TEE. Pending ortho surgery.  Assessment & Plan   MSSA bacteremia with leukocytosis -Initially admitted with WBC of 14.2, was afebrile at that time -Suspect source is right prosthetic knee -Blood cultures showed MSSA -Infectious disease consulted and appreciated -Continue Ancef and rifampin  -Repeat blood cultures on 10/11/2017 show no growth to  date -Cardiology consulted, status post TEE, no vegetations noted -leukocytosis worsening, currently 24.2 (possibly secondary to constipation vs infection) -continue to monitor CBC  Intractable right knee pain with swelling secondary to fall and infection -Orthopedics consulted and appreciated -Right knee x-ray showed no acute fracture or dislocation. -Right hip x-ray showed no evident acute fracture dislocation -CT of the right knee: No acute osseous injury of the right knee.  Right total knee arthroplasty with a thin lucency around the tibial stem component of the arthroplasty measuring less than 1 mm concerning for loosening.  Large right joint effusion with synovial thickening. -Patient was started on prednisone for possible gout flare.  Although she tells me she has no history of gout. Prednisone discontinued  -PT recommending SNF versus home health pending operative course -s/p right knee aspiration, Culture: moderate staph auerus. Gram stain shows abundant GPC -Orthopedics recommending surgery and antibiotic spacer  Acute rhabdomyolysis -In the setting of fall and trauma -CK on admission 1738, down to 269  Dizziness/lightheadedness/generalized weakness -Has been occurring over the last several months and worsening with postural changes -TSH 2.573 -UA unremarkable  Coronary artery disease/aortic valve replacement -Currently chest pain-free -Continue losartan, verapamil -Coumadin now held given that infectious disease started rifampin -Was placed on heparin -Statin held given rhabdo  Supratherapeutic INR -Despite being off of Coumadin, INR 3.34 -Suspect possibly due to infection or rifampin use -Hold heparin at this time given vitamin K IV, 1 mg -Monitor INR  Essential hypertension -Continue losartan, verapamil  Hyperlipidemia -Statin held due to rhabdomyolysis  Depression -Continue  bupropion  Normocytic anemia -hemoglobin currently 8.5 baseline appears to be  approximately 11-12 -Suspect drop in hemoglobin secondary to dilutional component as patient is receiving IV fluids -Continue iron supplementation -Continue to monitor CBC   Hypothyroidism -Continue Synthroid -TSH 2.573  Hypokalemia -Resolved with replacement -Magnesium 1.8 -Continue to monitor   Constipation -Will order dulcolax suppository -Suspect secondary to immobility and pain medications  Elevated bilirubin -will obtain indirect and direct -?rifampin -LFTs WNL  DVT Prophylaxis   heparin  Code Status: Full  Family Communication: None at bedside  Disposition Plan: Admitted. Pending extraction of right knee prosthesis and placement of antibiotic spacer by orthopedic surgery sometime this week, possibly 10/16/2017. Disposition pending.  Consultants Orthopedics Infectious disease Cardiology  Procedures  None  Antibiotics   Anti-infectives (From admission, onward)   Start     Dose/Rate Route Frequency Ordered Stop   10/11/17 1000  rifampin (RIFADIN) 300 mg in sodium chloride 0.9 % 100 mL IVPB     300 mg 200 mL/hr over 30 Minutes Intravenous Every 12 hours 10/10/17 1603     10/10/17 1200  ceFAZolin (ANCEF) IVPB 2g/100 mL premix     2 g 200 mL/hr over 30 Minutes Intravenous Every 8 hours 10/10/17 1111        Subjective:   Paula Ferguson seen and examined today.  Would like to have surgery earlier than day rather than in the afternoon.  Denies current chest pain, shortness breath, abdominal pain, nausea or vomiting, diarrhea constipation.  Continues to have some left arm swelling but is moving it more.  Also feels that her right knee is a little bit more swollen today.  Objective:   Vitals:   10/14/17 0634 10/14/17 1327 10/14/17 2042 10/15/17 0639  BP: (!) 147/72 (!) 187/90 (!) 172/85   Pulse: 96 99 (!) 108   Resp: 18 16 18    Temp: 98.9 F (37.2 C) 98.1 F (36.7 C) 98.8 F (37.1 C)   TempSrc: Oral Oral Oral   SpO2: 98% 100% 100%   Weight:    64.6 kg    Height:        Intake/Output Summary (Last 24 hours) at 10/15/2017 1259 Last data filed at 10/15/2017 1033 Gross per 24 hour  Intake 700.79 ml  Output 100 ml  Net 600.79 ml   Filed Weights   10/12/17 0500 10/13/17 2200 10/15/17 0639  Weight: 63 kg 64.6 kg 64.6 kg   Exam  General: Well developed, well nourished, NAD, appears stated age  79: NCAT, mildly icteric sclera, mucous membranes moist.   Neck: Supple  Cardiovascular: S1 S2 auscultated, RRR, no murmur  Respiratory: Clear to auscultation bilaterally with equal chest rise  Abdomen: Soft, nontender, nondistended, + bowel sounds  Extremities: warm dry without cyanosis clubbing or edema. Mild R knee edema  Neuro: AAOx3, nonfocal  Psych: Normal affect and demeanor  Data Reviewed: I have personally reviewed following labs and imaging studies  CBC: Recent Labs  Lab 10/09/17 0922  10/11/17 0456 10/12/17 0619 10/13/17 0514 10/14/17 0451 10/15/17 0409  WBC 14.2*   < > 9.8 9.9 14.2* 18.8* 24.2*  NEUTROABS 12.0*  --   --   --   --   --   --   HGB 11.4*   < > 9.7* 9.8* 9.8* 8.9* 8.5*  HCT 34.2*   < > 28.8* 28.9* 29.0* 25.9* 24.7*  MCV 96.3   < > 96.0 94.4 94.2 94.5 93.2  PLT 153   < > 110* 147* 143*  144* 177   < > = values in this interval not displayed.   Basic Metabolic Panel: Recent Labs  Lab 10/09/17 0922  10/11/17 0456 10/12/17 0619 10/13/17 0514 10/13/17 1311 10/14/17 0451 10/15/17 0409  NA 140   < > 135 137 139  --  140 137  K 3.8   < > 4.1 3.5 3.1*  --  3.8 3.6  CL 105   < > 104 109 111  --  114* 110  CO2 25   < > 23 24 23   --  23 21*  GLUCOSE 92   < > 94 96 83  --  84 63*  BUN 21   < > 29* 30* 27*  --  29* 29*  CREATININE 0.79   < > 1.00 0.78 0.73  --  0.78 0.67  CALCIUM 9.0   < > 8.4* 8.3* 8.1*  --  7.8* 7.6*  MG 1.5*  --   --   --   --  1.8  --   --   PHOS 2.6  --   --   --   --   --   --   --    < > = values in this interval not displayed.   GFR: Estimated Creatinine Clearance: 52.8  mL/min (by C-G formula based on SCr of 0.67 mg/dL). Liver Function Tests: Recent Labs  Lab 10/10/17 0538 10/15/17 0409  AST 73* 28  ALT 29 6  ALKPHOS 70 79  BILITOT 2.1* 4.1*  PROT 6.2* 5.0*  ALBUMIN 2.3* 1.7*   No results for input(s): LIPASE, AMYLASE in the last 168 hours. No results for input(s): AMMONIA in the last 168 hours. Coagulation Profile: Recent Labs  Lab 10/09/17 0922 10/10/17 0538 10/15/17 0409  INR 1.92 1.88 3.34   Cardiac Enzymes: Recent Labs  Lab 10/09/17 0922 10/11/17 1432 10/13/17 1311  CKTOTAL 1,802*  1,793* 269* 70  CKMB 10.5*  --   --    BNP (last 3 results) No results for input(s): PROBNP in the last 8760 hours. HbA1C: No results for input(s): HGBA1C in the last 72 hours. CBG: Recent Labs  Lab 10/11/17 0806 10/12/17 0734 10/13/17 0749 10/14/17 0746 10/15/17 0735  GLUCAP 91 76 99 70 66*   Lipid Profile: No results for input(s): CHOL, HDL, LDLCALC, TRIG, CHOLHDL, LDLDIRECT in the last 72 hours. Thyroid Function Tests: No results for input(s): TSH, T4TOTAL, FREET4, T3FREE, THYROIDAB in the last 72 hours. Anemia Panel: No results for input(s): VITAMINB12, FOLATE, FERRITIN, TIBC, IRON, RETICCTPCT in the last 72 hours. Urine analysis:    Component Value Date/Time   COLORURINE AMBER (A) 10/10/2017 0218   APPEARANCEUR CLEAR 10/10/2017 0218   LABSPEC 1.027 10/10/2017 0218   PHURINE 5.0 10/10/2017 0218   GLUCOSEU NEGATIVE 10/10/2017 0218   HGBUR SMALL (A) 10/10/2017 0218   HGBUR negative 04/09/2008 1252   BILIRUBINUR NEGATIVE 10/10/2017 0218   BILIRUBINUR n 03/01/2017 1544   KETONESUR NEGATIVE 10/10/2017 0218   PROTEINUR NEGATIVE 10/10/2017 0218   UROBILINOGEN 1.0 03/01/2017 1544   UROBILINOGEN 1.0 03/14/2011 1520   NITRITE NEGATIVE 10/10/2017 0218   LEUKOCYTESUR NEGATIVE 10/10/2017 0218   Sepsis Labs: @LABRCNTIP (procalcitonin:4,lacticidven:4)  ) Recent Results (from the past 240 hour(s))  Culture, blood (routine x 2)      Status: Abnormal   Collection Time: 10/09/17 12:29 PM  Result Value Ref Range Status   Specimen Description   Final    BLOOD RIGHT ANTECUBITAL Performed at [facility], [place]  [address], [place], [place] [address]    Special Requests   Final    BOTTLES DRAWN AEROBIC AND ANAEROBIC Blood Culture results may not be optimal due to an inadequate volume of blood received in culture bottles Performed at [place] [address] [place], [place] [address]    Culture  Setup Time   Final    GRAM POSITIVE COCCI IN BOTH AEROBIC AND ANAEROBIC BOTTLES CRITICAL VALUE NOTED.  VALUE IS CONSISTENT WITH PREVIOUSLY REPORTED AND CALLED VALUE.    Culture (A)  Final    STAPHYLOCOCCUS AUREUS SUSCEPTIBILITIES PERFORMED ON PREVIOUS CULTURE WITHIN THE LAST 5 DAYS. Performed at [facility], [place] [address] [place], [place] [address]    Report Status 10/12/2017 FINAL  Final  Culture, blood (routine x 2)     Status: Abnormal   Collection Time: 10/09/17 12:34 PM  Result Value Ref Range Status   Specimen Description   Final    BLOOD LEFT ANTECUBITAL Performed at [place] [address] [place], [place] [address]    Special Requests   Final    BOTTLES DRAWN AEROBIC AND ANAEROBIC Blood Culture adequate volume Performed at [place] [address], [place], [place] [address]    Culture  Setup Time   Final    GRAM POSITIVE COCCI IN CLUSTERS IN BOTH AEROBIC AND ANAEROBIC BOTTLES CRITICAL RESULT CALLED TO, READ BACK BY AND VERIFIED WITH: Karel Jarvis PharmD 9:50 10/10/17 ([email or web address]) Performed at [facility], [place] [address] [place], [place] [address]    Culture STAPHYLOCOCCUS AUREUS (A)  Final   Report Status 10/12/2017 FINAL  Final   Organism ID, Bacteria STAPHYLOCOCCUS AUREUS  Final      Susceptibility   Staphylococcus aureus - MIC*    CIPROFLOXACIN <=0.5 SENSITIVE Sensitive     ERYTHROMYCIN >=8 RESISTANT  Resistant     GENTAMICIN <=0.5 SENSITIVE Sensitive     OXACILLIN 0.5 SENSITIVE Sensitive     TETRACYCLINE <=1 SENSITIVE Sensitive     VANCOMYCIN <=0.5 SENSITIVE Sensitive     TRIMETH/SULFA <=10 SENSITIVE Sensitive     CLINDAMYCIN <=0.25 SENSITIVE Sensitive     RIFAMPIN <=0.5 SENSITIVE Sensitive     Inducible Clindamycin NEGATIVE Sensitive     * STAPHYLOCOCCUS AUREUS  Blood Culture ID Panel (Reflexed)     Status: Abnormal   Collection Time: 10/09/17 12:34 PM  Result Value Ref Range Status   Enterococcus species NOT DETECTED NOT DETECTED Final   Listeria monocytogenes NOT DETECTED NOT DETECTED Final   Staphylococcus species DETECTED (A) NOT DETECTED Final    Comment: CRITICAL RESULT CALLED TO, READ BACK BY AND VERIFIED WITH: Karel Jarvis PharmD 9:50 10/10/17 ([email or web address])    Staphylococcus aureus DETECTED (A) NOT DETECTED Final    Comment: Methicillin (oxacillin) susceptible Staphylococcus aureus (MSSA). Preferred therapy is anti staphylococcal beta lactam antibiotic (Cefazolin or Nafcillin), unless clinically contraindicated. CRITICAL RESULT CALLED TO, READ BACK BY AND VERIFIED WITH: Karel Jarvis PharmD 9:50 10/10/17 ([email or web address])    Methicillin resistance NOT DETECTED NOT DETECTED Final   Streptococcus species NOT DETECTED NOT DETECTED Final   Streptococcus agalactiae NOT DETECTED NOT DETECTED Final   Streptococcus pneumoniae NOT DETECTED NOT DETECTED Final   Streptococcus pyogenes NOT DETECTED NOT DETECTED Final   Acinetobacter baumannii NOT DETECTED NOT DETECTED Final   Enterobacteriaceae species NOT DETECTED NOT DETECTED Final   Enterobacter cloacae complex NOT DETECTED NOT DETECTED Final   Escherichia coli NOT DETECTED NOT DETECTED Final  Klebsiella oxytoca NOT DETECTED NOT DETECTED Final   Klebsiella pneumoniae NOT DETECTED NOT DETECTED Final   Proteus species NOT DETECTED NOT DETECTED Final   Serratia marcescens NOT DETECTED NOT DETECTED Final   Haemophilus influenzae NOT DETECTED NOT  DETECTED Final   Neisseria meningitidis NOT DETECTED NOT DETECTED Final   Pseudomonas aeruginosa NOT DETECTED NOT DETECTED Final   Candida albicans NOT DETECTED NOT DETECTED Final   Candida glabrata NOT DETECTED NOT DETECTED Final   Candida krusei NOT DETECTED NOT DETECTED Final   Candida parapsilosis NOT DETECTED NOT DETECTED Final   Candida tropicalis NOT DETECTED NOT DETECTED Final    Comment: Performed at [facility], [place] [address] [place], [place] [address]  Urine culture     Status: Abnormal   Collection Time: 10/10/17  2:18 AM  Result Value Ref Range Status   Specimen Description   Final    URINE, RANDOM Performed at [place] [address], [place], [place] [address]    Special Requests   Final    NONE Performed at [facility], [place] [address] [place], [place] [address]    Culture MULTIPLE SPECIES PRESENT, SUGGEST RECOLLECTION (A)  Final   Report Status 10/12/2017 FINAL  Final  Aerobic/Anaerobic Culture (surgical/deep wound)     Status: None   Collection Time: 10/10/17  5:45 PM  Result Value Ref Range Status   Specimen Description   Final    SYNOVIAL Performed at [place] [address] [place], [place] [address]    Special Requests   Final    NONE Performed at [facility], [place] [address] [place], [place] [address]    Gram Stain   Final    ABUNDANT WBC PRESENT, PREDOMINANTLY PMN ABUNDANT GRAM POSITIVE COCCI IN PAIRS IN CHAINS IN CLUSTERS Gram Stain Report Called to,Read Back By and Verified With: S.WRIGHT AT 1929 ON 10/10/17 BY N.THOMPSON Performed at [facility], [place] [address], [place], [place] [address]    Culture   Final    MODERATE STAPHYLOCOCCUS AUREUS NO ANAEROBES ISOLATED Performed at [facility], [place] [address] [place], [place] [address]    Report Status 10/15/2017 FINAL  Final   Organism ID, Bacteria STAPHYLOCOCCUS AUREUS  Final        Susceptibility   Staphylococcus aureus - MIC*    CIPROFLOXACIN <=0.5 SENSITIVE Sensitive     ERYTHROMYCIN >=8 RESISTANT Resistant     GENTAMICIN <=0.5 SENSITIVE Sensitive     OXACILLIN 0.5 SENSITIVE Sensitive     TETRACYCLINE <=1 SENSITIVE Sensitive     VANCOMYCIN 1 SENSITIVE Sensitive     TRIMETH/SULFA <=10 SENSITIVE Sensitive     CLINDAMYCIN <=0.25 SENSITIVE Sensitive     RIFAMPIN <=0.5 SENSITIVE Sensitive     Inducible Clindamycin NEGATIVE Sensitive     * MODERATE STAPHYLOCOCCUS AUREUS  Culture, blood (routine x 2)     Status: None (Preliminary result)   Collection Time: 10/11/17  1:09 PM  Result Value Ref Range Status   Specimen Description   Final    BLOOD RIGHT ANTECUBITAL Performed at [place] [address], [place],  [address]    Special Requests   Final    BOTTLES DRAWN AEROBIC AND ANAEROBIC Blood Culture adequate volume Performed at [place] [address], [place],  [address]    Culture   Final    NO GROWTH 4 DAYS Performed at [facility], [place] [address], [place], [place] [address]    Report Status PENDING  Incomplete  Culture, blood (routine x 2)     Status: None (Preliminary result)   Collection Time: 10/11/17  1:13 PM  Result Value Ref Range Status   Specimen Description   Final    BLOOD BLOOD RIGHT FOREARM Performed at [facility] [address], [place], [place] [address]    Special Requests   Final    BOTTLES DRAWN AEROBIC AND ANAEROBIC Blood Culture adequate volume Performed at [place] [address], [place], [place] [address]    Culture   Final    NO GROWTH 4 DAYS Performed at [facility], [place] [address] [place], [place] [address]    Report Status PENDING  Incomplete      Radiology Studies: Dg Chest Port 1 View  Result Date: 10/14/2017 CLINICAL DATA:  Lightheadedness, dizziness, weakness, worsening symptoms over the  last few months. EXAM: PORTABLE CHEST 1 VIEW COMPARISON:  Chest x-rays dated 10/09/2017 and 03/29/2017. FINDINGS: Heart size and mediastinal contours are stable. Median sternotomy wires appear intact and stable in alignment. Cardiac valve hardware appears stable in position. Probable mild atelectasis at the LEFT lung base. Blunting of the costophrenic angles bilaterally, likely chronic pleural thickening. Lungs otherwise clear. No acute or suspicious osseous finding. IMPRESSION: No active disease.  No evidence of pneumonia or pulmonary edema. Electronically Signed   By: Franki Cabot M.D.   On: 10/14/2017 16:38     Scheduled Meds: . buPROPion  150 mg Oral Daily  . docusate sodium  100 mg Oral BID  . ferrous sulfate  325 mg Oral Daily  . levothyroxine  100 mcg Oral QAC breakfast  . lidocaine  10 mL Intradermal Once  . losartan  100 mg Oral Daily  . verapamil  120 mg Oral QHS   Continuous Infusions: . sodium chloride Stopped (10/13/17 2150)  .  ceFAZolin (ANCEF) IV Stopped (10/15/17 0503)  . phytonadione (VITAMIN K) IV 1 mg (10/15/17 1247)  . rifampin (RIFADIN) IVPB 300 mg (10/15/17 1010)     LOS: 5 days   Time Spent in minutes   30 minutes  Disa Riedlinger D.O. on 10/15/2017 at 12:59 PM  Between 7am to 7pm - Please see pager noted on [email or web address]  After 7pm go to [email or web address]  And look for the night coverage person covering for me after hours  [facility] Office  [phone]

## 2017-10-16 ENCOUNTER — Encounter (HOSPITAL_COMMUNITY): Payer: Self-pay | Admitting: Cardiovascular Disease

## 2017-10-16 ENCOUNTER — Inpatient Hospital Stay (HOSPITAL_COMMUNITY): Payer: Medicare Other | Admitting: Anesthesiology

## 2017-10-16 ENCOUNTER — Inpatient Hospital Stay (HOSPITAL_COMMUNITY): Payer: Medicare Other

## 2017-10-16 ENCOUNTER — Encounter (HOSPITAL_COMMUNITY)
Admission: EM | Disposition: A | Discharge status: Skilled Nursing Facility | Payer: Self-pay | Source: Home / Self Care | Attending: Internal Medicine

## 2017-10-16 ENCOUNTER — Inpatient Hospital Stay: Admission: RE | Admit: 2017-10-16 | Payer: Medicare Other | Source: Ambulatory Visit

## 2017-10-16 ENCOUNTER — Inpatient Hospital Stay: Payer: Self-pay

## 2017-10-16 DIAGNOSIS — Z96659 Presence of unspecified artificial knee joint: Secondary | ICD-10-CM

## 2017-10-16 HISTORY — PX: EXCISIONAL TOTAL KNEE ARTHROPLASTY WITH ANTIBIOTIC SPACERS: SHX5827

## 2017-10-16 LAB — COMPREHENSIVE METABOLIC PANEL
ALBUMIN: 1.7 g/dL — AB (ref 3.5–5.0)
ALK PHOS: 79 U/L (ref 38–126)
ALT: 6 U/L (ref 0–44)
ANION GAP: 6 (ref 5–15)
AST: 27 U/L (ref 15–41)
BILIRUBIN TOTAL: 6 mg/dL — AB (ref 0.3–1.2)
BUN: 32 mg/dL — ABNORMAL HIGH (ref 8–23)
CALCIUM: 7.7 mg/dL — AB (ref 8.9–10.3)
CO2: 20 mmol/L — ABNORMAL LOW (ref 22–32)
CREATININE: 0.75 mg/dL (ref 0.44–1.00)
Chloride: 108 mmol/L (ref 98–111)
GFR calc Af Amer: >60 mL/min (ref >60)
GFR calc non Af Amer: >60 mL/min (ref >60)
GLUCOSE: 69 mg/dL — AB (ref 70–99)
Potassium: 3.4 mmol/L — ABNORMAL LOW (ref 3.5–5.1)
Sodium: 134 mmol/L — ABNORMAL LOW (ref 135–145)
TOTAL PROTEIN: 4.8 g/dL — AB (ref 6.5–8.1)

## 2017-10-16 LAB — GLUCOSE, CAPILLARY
GLUCOSE-CAPILLARY: 61 mg/dL — AB (ref 70–99)
GLUCOSE-CAPILLARY: 83 mg/dL (ref 70–99)

## 2017-10-16 LAB — CULTURE, BLOOD (ROUTINE X 2)
CULTURE: NO GROWTH
Culture: NO GROWTH
SPECIAL REQUESTS: ADEQUATE
SPECIAL REQUESTS: ADEQUATE

## 2017-10-16 LAB — CBC
HEMATOCRIT: 24 % — AB (ref 36.0–46.0)
HEMOGLOBIN: 8.3 g/dL — AB (ref 12.0–15.0)
MCH: 32.3 pg (ref 26.0–34.0)
MCHC: 34.6 g/dL (ref 30.0–36.0)
MCV: 93.4 fL (ref 78.0–100.0)
Platelets: 188 10*3/uL (ref 150–400)
RBC: 2.57 MIL/uL — ABNORMAL LOW (ref 3.87–5.11)
RDW: 18 % — AB (ref 11.5–15.5)
WBC: 25.9 10*3/uL — AB (ref 4.0–10.5)

## 2017-10-16 LAB — PROTIME-INR
INR: 1.44
PROTHROMBIN TIME: 17.5 s — AB (ref 11.4–15.2)

## 2017-10-16 LAB — HAPTOGLOBIN: Haptoglobin: <10 mg/dL — ABNORMAL LOW (ref 34–200)

## 2017-10-16 SURGERY — REMOVAL, TOTAL ARTHROPLASTY HARDWARE, KNEE, WITH ANTIBIOTIC SPACER INSERTION
Anesthesia: General | Site: Knee

## 2017-10-16 MED ORDER — CELECOXIB 200 MG PO CAPS
200.0000 mg | ORAL_CAPSULE | Freq: Two times a day (BID) | ORAL | Status: DC
  Administered 2017-10-16 – 2017-10-21 (×10): 200 mg via ORAL
  Filled 2017-10-16 (×10): qty 1

## 2017-10-16 MED ORDER — ENOXAPARIN SODIUM 40 MG/0.4ML ~~LOC~~ SOLN
40.0000 mg | SUBCUTANEOUS | Status: DC
  Administered 2017-10-17: 40 mg via SUBCUTANEOUS
  Filled 2017-10-16: qty 0.4

## 2017-10-16 MED ORDER — PHENYLEPHRINE HCL 10 MG/ML IJ SOLN
INTRAMUSCULAR | Status: DC | PRN
  Administered 2017-10-16: 80 ug via INTRAVENOUS

## 2017-10-16 MED ORDER — PHENYLEPHRINE 40 MCG/ML (10ML) SYRINGE FOR IV PUSH (FOR BLOOD PRESSURE SUPPORT)
PREFILLED_SYRINGE | INTRAVENOUS | Status: AC
  Filled 2017-10-16: qty 10

## 2017-10-16 MED ORDER — WARFARIN SODIUM 5 MG PO TABS
5.0000 mg | ORAL_TABLET | Freq: Once | ORAL | Status: AC
  Administered 2017-10-16: 5 mg via ORAL
  Filled 2017-10-16 (×2): qty 1

## 2017-10-16 MED ORDER — KETOROLAC TROMETHAMINE 30 MG/ML IJ SOLN
INTRAMUSCULAR | Status: AC
  Filled 2017-10-16: qty 1

## 2017-10-16 MED ORDER — FENTANYL CITRATE (PF) 250 MCG/5ML IJ SOLN
INTRAMUSCULAR | Status: AC
  Filled 2017-10-16: qty 5

## 2017-10-16 MED ORDER — PROMETHAZINE HCL 25 MG/ML IJ SOLN: 6.2500 mg | INTRAMUSCULAR | Status: DC | PRN

## 2017-10-16 MED ORDER — VANCOMYCIN HCL 1000 MG IV SOLR
INTRAVENOUS | Status: DC | PRN
  Administered 2017-10-16: 1000 mg via TOPICAL

## 2017-10-16 MED ORDER — MORPHINE SULFATE (PF) 2 MG/ML IV SOLN
0.5000 mg | INTRAVENOUS | Status: DC | PRN
  Administered 2017-10-20 – 2017-10-21 (×2): 1 mg via INTRAVENOUS
  Filled 2017-10-16 (×2): qty 1

## 2017-10-16 MED ORDER — DEXTROSE 50 % IV SOLN
1.0000 | Freq: Once | INTRAVENOUS | Status: AC
  Administered 2017-10-16: 50 mL via INTRAVENOUS

## 2017-10-16 MED ORDER — WARFARIN - PHARMACIST DOSING INPATIENT: Freq: Every day | Status: DC

## 2017-10-16 MED ORDER — DEXAMETHASONE SODIUM PHOSPHATE 10 MG/ML IJ SOLN
10.0000 mg | Freq: Once | INTRAMUSCULAR | Status: AC
  Administered 2017-10-17: 10 mg via INTRAVENOUS
  Filled 2017-10-16: qty 1

## 2017-10-16 MED ORDER — ALUM & MAG HYDROXIDE-SIMETH 200-200-20 MG/5ML PO SUSP: 15.0000 mL | ORAL | Status: DC | PRN

## 2017-10-16 MED ORDER — VANCOMYCIN HCL IN DEXTROSE 1-5 GM/200ML-% IV SOLN
INTRAVENOUS | Status: AC
  Filled 2017-10-16: qty 200

## 2017-10-16 MED ORDER — PROPOFOL 10 MG/ML IV BOLUS
INTRAVENOUS | Status: AC
  Filled 2017-10-16: qty 20

## 2017-10-16 MED ORDER — SUGAMMADEX SODIUM 200 MG/2ML IV SOLN
INTRAVENOUS | Status: DC | PRN
  Administered 2017-10-16: 150 mg via INTRAVENOUS

## 2017-10-16 MED ORDER — TOBRAMYCIN SULFATE 1.2 G IJ SOLR
INTRAMUSCULAR | Status: AC
  Filled 2017-10-16: qty 3.6

## 2017-10-16 MED ORDER — ROCURONIUM BROMIDE 100 MG/10ML IV SOLN
INTRAVENOUS | Status: DC | PRN
  Administered 2017-10-16: 40 mg via INTRAVENOUS

## 2017-10-16 MED ORDER — VANCOMYCIN HCL 1000 MG IV SOLR
INTRAVENOUS | Status: DC | PRN
  Administered 2017-10-16: 3 g

## 2017-10-16 MED ORDER — SODIUM CHLORIDE 0.9 % IJ SOLN
INTRAMUSCULAR | Status: DC | PRN
  Administered 2017-10-16: 30 mL

## 2017-10-16 MED ORDER — METOCLOPRAMIDE HCL 5 MG/ML IJ SOLN
5.0000 mg | Freq: Three times a day (TID) | INTRAMUSCULAR | Status: DC | PRN
  Administered 2017-10-18: 10 mg via INTRAVENOUS
  Filled 2017-10-16: qty 2

## 2017-10-16 MED ORDER — SODIUM CHLORIDE 0.9 % IV SOLN
INTRAVENOUS | Status: DC
  Administered 2017-10-16 – 2017-10-17 (×2): via INTRAVENOUS

## 2017-10-16 MED ORDER — BUPIVACAINE-EPINEPHRINE (PF) 0.25% -1:200000 IJ SOLN
INTRAMUSCULAR | Status: AC
  Filled 2017-10-16: qty 30

## 2017-10-16 MED ORDER — HYDROMORPHONE HCL 1 MG/ML IJ SOLN
0.2500 mg | INTRAMUSCULAR | Status: DC | PRN
  Administered 2017-10-16: 0.5 mg via INTRAVENOUS
  Filled 2017-10-16: qty 0.5

## 2017-10-16 MED ORDER — DIPHENHYDRAMINE HCL 12.5 MG/5ML PO ELIX: 12.5000 mg | ORAL_SOLUTION | ORAL | Status: DC | PRN

## 2017-10-16 MED ORDER — VANCOMYCIN HCL 1000 MG IV SOLR
INTRAVENOUS | Status: AC
  Filled 2017-10-16: qty 3000

## 2017-10-16 MED ORDER — ORAL CARE MOUTH RINSE
15.0000 mL | Freq: Two times a day (BID) | OROMUCOSAL | Status: DC
  Administered 2017-10-16 – 2017-10-20 (×7): 15 mL via OROMUCOSAL

## 2017-10-16 MED ORDER — SUGAMMADEX SODIUM 200 MG/2ML IV SOLN
INTRAVENOUS | Status: AC
  Filled 2017-10-16: qty 2

## 2017-10-16 MED ORDER — DOCUSATE SODIUM 100 MG PO CAPS
100.0000 mg | ORAL_CAPSULE | Freq: Two times a day (BID) | ORAL | Status: DC
  Administered 2017-10-16 – 2017-10-20 (×8): 100 mg via ORAL
  Filled 2017-10-16 (×9): qty 1

## 2017-10-16 MED ORDER — FENTANYL CITRATE (PF) 100 MCG/2ML IJ SOLN
INTRAMUSCULAR | Status: AC
  Filled 2017-10-16: qty 4

## 2017-10-16 MED ORDER — ROCURONIUM BROMIDE 100 MG/10ML IV SOLN
INTRAVENOUS | Status: AC
  Filled 2017-10-16: qty 1

## 2017-10-16 MED ORDER — LACTATED RINGERS IV SOLN
INTRAVENOUS | Status: DC
  Administered 2017-10-16 (×2): via INTRAVENOUS

## 2017-10-16 MED ORDER — DEXAMETHASONE SODIUM PHOSPHATE 10 MG/ML IJ SOLN
INTRAMUSCULAR | Status: DC | PRN
  Administered 2017-10-16: 10 mg via INTRAVENOUS

## 2017-10-16 MED ORDER — TOBRAMYCIN SULFATE 1.2 G IJ SOLR
INTRAMUSCULAR | Status: DC | PRN
  Administered 2017-10-16: 3.6 g

## 2017-10-16 MED ORDER — POVIDONE-IODINE 10 % EX SWAB
2.0000 "application " | Freq: Once | CUTANEOUS | Status: AC
  Administered 2017-10-16: 2 via TOPICAL

## 2017-10-16 MED ORDER — STERILE WATER FOR IRRIGATION IR SOLN
Status: DC | PRN
  Administered 2017-10-16: 2000 mL

## 2017-10-16 MED ORDER — FENTANYL CITRATE (PF) 100 MCG/2ML IJ SOLN
25.0000 ug | INTRAMUSCULAR | Status: DC | PRN
  Administered 2017-10-16: 50 ug via INTRAVENOUS

## 2017-10-16 MED ORDER — VANCOMYCIN HCL 1000 MG IV SOLR
INTRAVENOUS | Status: AC
  Filled 2017-10-16: qty 1000

## 2017-10-16 MED ORDER — TRANEXAMIC ACID 1000 MG/10ML IV SOLN
1000.0000 mg | Freq: Once | INTRAVENOUS | Status: AC
  Administered 2017-10-16: 1000 mg via INTRAVENOUS
  Filled 2017-10-16: qty 1000

## 2017-10-16 MED ORDER — PHENOL 1.4 % MT LIQD
1.0000 | OROMUCOSAL | Status: DC | PRN
  Filled 2017-10-16: qty 177

## 2017-10-16 MED ORDER — MAGNESIUM CITRATE PO SOLN: 1.0000 | Freq: Once | ORAL | Status: DC | PRN

## 2017-10-16 MED ORDER — HYDROCODONE-ACETAMINOPHEN 5-325 MG PO TABS
1.0000 | ORAL_TABLET | ORAL | Status: DC | PRN
  Administered 2017-10-17 – 2017-10-20 (×7): 2 via ORAL
  Filled 2017-10-16 (×7): qty 2

## 2017-10-16 MED ORDER — ONDANSETRON HCL 4 MG/2ML IJ SOLN
INTRAMUSCULAR | Status: DC | PRN
  Administered 2017-10-16: 4 mg via INTRAVENOUS

## 2017-10-16 MED ORDER — POTASSIUM CHLORIDE CRYS ER 20 MEQ PO TBCR
40.0000 meq | EXTENDED_RELEASE_TABLET | Freq: Once | ORAL | Status: DC
  Filled 2017-10-16: qty 2

## 2017-10-16 MED ORDER — PROPOFOL 10 MG/ML IV BOLUS
INTRAVENOUS | Status: DC | PRN
  Administered 2017-10-16: 100 mg via INTRAVENOUS

## 2017-10-16 MED ORDER — SODIUM CHLORIDE 0.9 % IJ SOLN
INTRAMUSCULAR | Status: AC
  Filled 2017-10-16: qty 50

## 2017-10-16 MED ORDER — SODIUM CHLORIDE 0.9 % IR SOLN
Status: DC | PRN
  Administered 2017-10-16: 1000 mL

## 2017-10-16 MED ORDER — POLYETHYLENE GLYCOL 3350 17 G PO PACK
17.0000 g | PACK | Freq: Two times a day (BID) | ORAL | Status: DC
  Administered 2017-10-17 – 2017-10-20 (×6): 17 g via ORAL
  Filled 2017-10-16 (×9): qty 1

## 2017-10-16 MED ORDER — CHLORHEXIDINE GLUCONATE 4 % EX LIQD: 60.0000 mL | Freq: Once | CUTANEOUS | Status: DC

## 2017-10-16 MED ORDER — SODIUM CHLORIDE 0.9 % IR SOLN
Status: DC | PRN
  Administered 2017-10-16: 6000 mL

## 2017-10-16 MED ORDER — FENTANYL CITRATE (PF) 100 MCG/2ML IJ SOLN
INTRAMUSCULAR | Status: DC | PRN
  Administered 2017-10-16 (×3): 50 ug via INTRAVENOUS
  Administered 2017-10-16: 100 ug via INTRAVENOUS

## 2017-10-16 MED ORDER — HYDROCODONE-ACETAMINOPHEN 7.5-325 MG PO TABS
1.0000 | ORAL_TABLET | ORAL | Status: DC | PRN
  Administered 2017-10-16: 2 via ORAL
  Administered 2017-10-16: 1 via ORAL
  Administered 2017-10-18: 2 via ORAL
  Filled 2017-10-16: qty 2
  Filled 2017-10-16: qty 1
  Filled 2017-10-16: qty 2

## 2017-10-16 MED ORDER — MENTHOL 3 MG MT LOZG: 1.0000 | LOZENGE | OROMUCOSAL | Status: DC | PRN

## 2017-10-16 MED ORDER — BUPIVACAINE-EPINEPHRINE (PF) 0.25% -1:200000 IJ SOLN
INTRAMUSCULAR | Status: DC | PRN
  Administered 2017-10-16: 30 mL

## 2017-10-16 MED ORDER — ONDANSETRON HCL 4 MG/2ML IJ SOLN
INTRAMUSCULAR | Status: AC
  Filled 2017-10-16: qty 2

## 2017-10-16 MED ORDER — TRANEXAMIC ACID 1000 MG/10ML IV SOLN
1000.0000 mg | INTRAVENOUS | Status: AC
  Administered 2017-10-16: 1000 mg via INTRAVENOUS
  Filled 2017-10-16: qty 10

## 2017-10-16 MED ORDER — KETOROLAC TROMETHAMINE 30 MG/ML IJ SOLN
INTRAMUSCULAR | Status: DC | PRN
  Administered 2017-10-16: 30 mg via INTRAVENOUS

## 2017-10-16 MED ORDER — METOCLOPRAMIDE HCL 5 MG PO TABS: 5.0000 mg | ORAL_TABLET | Freq: Three times a day (TID) | ORAL | Status: DC | PRN

## 2017-10-16 SURGICAL SUPPLY — 71 items
BAG ZIPLOCK 12X15 (MISCELLANEOUS) ×3 IMPLANT
BANDAGE ACE 6X5 VEL STRL LF (GAUZE/BANDAGES/DRESSINGS) ×3 IMPLANT
BANDAGE ESMARK 6X9 LF (GAUZE/BANDAGES/DRESSINGS) ×1 IMPLANT
BLADE MIC 41X13 (BLADE) ×1 IMPLANT
BLADE MIC 41X13MM (BLADE) ×1
BLADE SAW SGTL 13.0X1.19X90.0M (BLADE) ×3 IMPLANT
BLADE SAW SGTL 81X20 HD (BLADE) ×3 IMPLANT
BNDG CMPR 9X6 STRL LF SNTH (GAUZE/BANDAGES/DRESSINGS) ×1
BNDG ESMARK 6X9 LF (GAUZE/BANDAGES/DRESSINGS) ×3
BOWL SMART MIX CTS (DISPOSABLE) ×4 IMPLANT
BRUSH FEMORAL CANAL (MISCELLANEOUS) ×2 IMPLANT
CEMENT HV SMART SET (Cement) ×9 IMPLANT
COVER SURGICAL LIGHT HANDLE (MISCELLANEOUS) ×3 IMPLANT
CUFF TOURN SGL QUICK 34 (TOURNIQUET CUFF) ×2
CUFF TRNQT CYL 34X4X40X1 (TOURNIQUET CUFF) ×1 IMPLANT
DERMABOND ADVANCED (GAUZE/BANDAGES/DRESSINGS) ×2
DERMABOND ADVANCED .7 DNX12 (GAUZE/BANDAGES/DRESSINGS) ×1 IMPLANT
DRAPE EXTREMITY T 121X128X90 (DRAPE) ×3 IMPLANT
DRAPE POUCH INSTRU U-SHP 10X18 (DRAPES) ×3 IMPLANT
DRAPE U-SHAPE 47X51 STRL (DRAPES) ×3 IMPLANT
DRESSING AQUACEL AG SP 3.5X10 (GAUZE/BANDAGES/DRESSINGS) ×1 IMPLANT
DRSG AQUACEL AG SP 3.5X10 (GAUZE/BANDAGES/DRESSINGS) ×3
DURAPREP 26ML APPLICATOR (WOUND CARE) ×6 IMPLANT
ELECT REM PT RETURN 15FT ADLT (MISCELLANEOUS) ×3 IMPLANT
FACESHIELD WRAPAROUND (MASK) ×15 IMPLANT
FACESHIELD WRAPAROUND OR TEAM (MASK) ×5 IMPLANT
FEMUR SIGMA PS SZ 3.0 R (Femur) ×2 IMPLANT
GLOVE BIO SURGEON STRL SZ7 (GLOVE) ×2 IMPLANT
GLOVE BIOGEL PI IND STRL 7.0 (GLOVE) IMPLANT
GLOVE BIOGEL PI IND STRL 7.5 (GLOVE) ×1 IMPLANT
GLOVE BIOGEL PI IND STRL 8.5 (GLOVE) ×1 IMPLANT
GLOVE BIOGEL PI IND STRL 9 (GLOVE) IMPLANT
GLOVE BIOGEL PI INDICATOR 7.0 (GLOVE) ×2
GLOVE BIOGEL PI INDICATOR 7.5 (GLOVE) ×8
GLOVE BIOGEL PI INDICATOR 8.5 (GLOVE) ×2
GLOVE BIOGEL PI INDICATOR 9 (GLOVE) ×2
GLOVE ECLIPSE 8.0 STRL XLNG CF (GLOVE) ×6 IMPLANT
GLOVE EUDERMIC 7 POWDERFREE (GLOVE) ×2 IMPLANT
GLOVE ORTHO TXT STRL SZ7.5 (GLOVE) ×6 IMPLANT
GLOVE SURG ORTHO 8.5 STRL (GLOVE) ×6 IMPLANT
GOWN SPEC L3 XXLG W/TWL (GOWN DISPOSABLE) ×4 IMPLANT
GOWN STRL REUS W/TWL LRG LVL3 (GOWN DISPOSABLE) ×3 IMPLANT
GOWN STRL REUS W/TWL XL LVL3 (GOWN DISPOSABLE) ×5 IMPLANT
HANDPIECE INTERPULSE COAX TIP (DISPOSABLE) ×2
IMMOBILIZER KNEE 20 (SOFTGOODS) ×3 IMPLANT
IMMOBILIZER KNEE 20 THIGH 36 (SOFTGOODS) IMPLANT
INSERT TIBIAL PFC 3 17.5 (Knees) ×2 IMPLANT
MANIFOLD NEPTUNE II (INSTRUMENTS) ×3 IMPLANT
MARKER SKIN DUAL TIP RULER LAB (MISCELLANEOUS) ×3 IMPLANT
NDL SAFETY ECLIPSE 18X1.5 (NEEDLE) ×1 IMPLANT
NEEDLE HYPO 18GX1.5 SHARP (NEEDLE) ×3
PATELLA DOME PFC 38MM (Knees) ×2 IMPLANT
POSITIONER SURGICAL ARM (MISCELLANEOUS) ×3 IMPLANT
SET HNDPC FAN SPRY TIP SCT (DISPOSABLE) ×1 IMPLANT
SET PAD KNEE POSITIONER (MISCELLANEOUS) ×3 IMPLANT
SPONGE LAP 18X18 RF (DISPOSABLE) ×3 IMPLANT
SPONGE LAP 18X18 X RAY DECT (DISPOSABLE) ×2 IMPLANT
STAPLER VISISTAT 35W (STAPLE) IMPLANT
SUCTION FRAZIER HANDLE 12FR (TUBING) ×2
SUCTION TUBE FRAZIER 12FR DISP (TUBING) ×1 IMPLANT
SUT MNCRL AB 4-0 PS2 18 (SUTURE) ×5 IMPLANT
SUT VIC AB 1 CT1 36 (SUTURE) ×6 IMPLANT
SUT VIC AB 2-0 CT1 27 (SUTURE) ×6
SUT VIC AB 2-0 CT1 TAPERPNT 27 (SUTURE) ×3 IMPLANT
SYR 50ML LL SCALE MARK (SYRINGE) ×3 IMPLANT
TOWEL OR 17X26 10 PK STRL BLUE (TOWEL DISPOSABLE) ×6 IMPLANT
TOWER CARTRIDGE SMART MIX (DISPOSABLE) ×3 IMPLANT
TRAY FOLEY CATH 14FR (SET/KITS/TRAYS/PACK) ×2 IMPLANT
WRAP KNEE MAXI GEL POST OP (GAUZE/BANDAGES/DRESSINGS) ×3 IMPLANT
YANKAUER SUCT BULB TIP 10FT TU (MISCELLANEOUS) ×3 IMPLANT
YANKAUER SUCT BULB TIP NO VENT (SUCTIONS) ×2 IMPLANT

## 2017-10-16 NOTE — Anesthesia Procedure Notes (Signed)
Procedure Name: Intubation Date/Time: 10/16/2017 12:06 PM Performed by: Glory Buff, CRNA Pre-anesthesia Checklist: Patient identified, Emergency Drugs available, Suction available and Patient being monitored Patient Re-evaluated:Patient Re-evaluated prior to induction Oxygen Delivery Method: Circle system utilized Preoxygenation: Pre-oxygenation with 100% oxygen Induction Type: IV induction Ventilation: Mask ventilation without difficulty Laryngoscope Size: Miller and 3 Grade View: Grade I Tube type: Oral Tube size: 7.0 mm Number of attempts: 1 Airway Equipment and Method: Stylet and Oral airway Placement Confirmation: ETT inserted through vocal cords under direct vision,  positive ETCO2 and breath sounds checked- equal and bilateral Secured at: 20 cm Tube secured with: Tape Dental Injury: Teeth and Oropharynx as per pre-operative assessment

## 2017-10-16 NOTE — Progress Notes (Addendum)
[facility]    Date of Admission:  10/09/2017   Total days of antibiotics 7          ID: Paula Ferguson is a 77 y.o. female with  MSSA bacteremia nad right knee pji c/b elevated INR and hyperbilirubinemia Active Problems:   Hypothyroidism   Hyperlipidemia   ANEMIA   Essential hypertension   Coronary atherosclerosis   S/P right knee replacement   Anticoagulation goal of INR 2.5 to 3.5   History of mechanical aortic valve replacement   Chronic pain   Knee pain, acute   Fall   Leukocytosis   Rhabdomyolysis   Dizziness   Lightheadedness   Generalized weakness   S/P total knee arthroplasty    Subjective: OR for athroplasty resection and abtx spacer./o right knee pain  Medications:  . [MAR Hold] buPROPion  150 mg Oral Daily  . chlorhexidine  60 mL Topical Once  . [MAR Hold] docusate sodium  100 mg Oral BID  . fentaNYL      . [MAR Hold] ferrous sulfate  325 mg Oral Daily  . [MAR Hold] levothyroxine  100 mcg Oral QAC breakfast  . [MAR Hold] lidocaine  10 mL Intradermal Once  . [MAR Hold] losartan  100 mg Oral Daily  . [MAR Hold] potassium chloride  40 mEq Oral Once  . [MAR Hold] verapamil  120 mg Oral QHS  . warfarin  5 mg Oral ONCE-1800  . Warfarin - Pharmacist Dosing Inpatient   Does not apply q1800    Objective: Vital signs in last 24 hours: Temp:  [97.5 F (36.4 C)-99.3 F (37.4 C)] 99 F (37.2 C) (08/27 1430) Pulse Rate:  [87-102] 94 (08/27 1445) Resp:  [15-21] 15 (08/27 1445) BP: (131-157)/(54-64) 131/59 (08/27 1445) SpO2:  [92 %-100 %] 92 % (08/27 1445) Weight:  [65.4 kg] 65.4 kg (08/27 0609)  Constitutional:  oriented to person, place, and time. appears well-developed and well-nourished. No distress.  HENT:scleral icterus,  [place]/AT, PERRLA, no scleral icterus Mouth/Throat: Oropharynx is clear and moist. No oropharyngeal exudate.  Cardiovascular: Normal rate, regular rhythm and normal heart sounds. Exam reveals no gallop and no  friction rub.  No murmur heard.  Pulmonary/Chest: Effort normal and breath sounds normal. No respiratory distress.  has no wheezes.  Abdominal: Soft. Bowel sounds are normal.  exhibits no distension. There is no tenderness.  Ext: right leg is wrapped with cold pack. Skin: Skin is warm and dry. No rash noted. No erythema.  Psychiatric: a normal mood and affect.  behavior is normal.    Lab Results Recent Labs    10/15/17 0409 10/16/17 0517  WBC 24.2* 25.9*  HGB 8.5* 8.3*  HCT 24.7* 24.0*  NA 137 134*  K 3.6 3.4*  CL 110 108  CO2 21* 20*  BUN 29* 32*  CREATININE 0.67 0.75   Liver Panel Recent Labs    10/15/17 0409 10/15/17 1350 10/16/17 0517  PROT 5.0*  --  4.8*  ALBUMIN 1.7*  --  1.7*  AST 28  --  27  ALT 6  --  6  ALKPHOS 79  --  79  BILITOT 4.1* 5.0* 6.0*  BILIDIR  --  3.2*  --   IBILI  --  1.8*  --     Microbiology: 8/20 blood cx -MSSA 8/21 synovial cx-MSSA Studies/Results: Dg Chest Port 1 View  Result Date: 10/14/2017 CLINICAL DATA:  Lightheadedness, dizziness, weakness, worsening symptoms over the last few months. EXAM:  PORTABLE CHEST 1 VIEW COMPARISON:  Chest x-rays dated 10/09/2017 and 03/29/2017. FINDINGS: Heart size and mediastinal contours are stable. Median sternotomy wires appear intact and stable in alignment. Cardiac valve hardware appears stable in position. Probable mild atelectasis at the LEFT lung base. Blunting of the costophrenic angles bilaterally, likely chronic pleural thickening. Lungs otherwise clear. No acute or suspicious osseous finding. IMPRESSION: No active disease.  No evidence of pneumonia or pulmonary edema. Electronically Signed   By: Franki Cabot M.D.   On: 10/14/2017 16:38   [place] Ekg Site Rite  Result Date: 10/16/2017 If Site Rite image not attached, placement could not be confirmed due to current cardiac rhythm.  US Abdomen Limited Ruq  Result Date: 10/16/2017 CLINICAL DATA:  Elevated bilirubin EXAM: ULTRASOUND ABDOMEN LIMITED  RIGHT UPPER QUADRANT COMPARISON:  None. FINDINGS: Gallbladder: Surgically absent Common bile duct: Diameter: 9.4 mm, upper normal for post cholecystectomy Liver: Nodular liver contour consistent with cirrhosis. No focal liver lesion. Mild ascites around the liver. Small right pleural effusion. Portal vein is patent on color Doppler imaging with normal direction of blood flow towards the liver. Limited imaging of the pancreas feels no mass lesion mild prominence of the pancreatic duct measuring 3.5 mm. IMPRESSION: Common bile duct 9.4 mm, upper normal for post cholecystectomy Nodular liver contour consistent with cirrhosis.  Mild ascites Small right pleural effusion Mild prominence pancreatic duct measuring 3.5 mm distally. Electronically Signed   By: Franchot Gallo M.D.   On: 10/16/2017 09:54     Assessment/Plan: Leukocytosis = thought to be secondary from MSSA infection- and source control.  Will continue to monitor and anticipate to trend down  MSSA pji = will stop rifampin since she has now abtx spacer. Continue on cefazolin alone  Hyperbilirubinemia = predominantly conjugated but unconjugated is also elevated with mild elevation of LDH. This could possibly be due to rifampin- which we have stopped today. - we would expect that if she had rifampin related hyperbilirubinemia - it maybe more transient unconjugated hyperbili vs. It interfering with bilirubin excretion (causing conjugated hyperbili)  Agree with GI consultation   mssa bacteremia = had TEE that ruled out vegetation. On cefazolin. Repeat blood cx NGTD  [facility] Cell: [phone] Pager: [phone]  10/16/2017, 2:47 PM

## 2017-10-16 NOTE — Interval H&P Note (Signed)
History and Physical Interval Note:  10/16/2017 11:39 AM  Paula Ferguson  has presented today for surgery, with the diagnosis of infected right total knee  The various methods of treatment have been discussed with the patient and family. After consideration of risks, benefits and other options for treatment, the patient has consented to  Procedure(s): EXCISIONAL RIGHT TOTAL KNEE ARTHROPLASTY WITH ANTIBIOTIC SPACERS (N/A) as a surgical intervention .  The patient's history has been reviewed, patient examined, no change in status, stable for surgery.  I have reviewed the patient's chart and labs.  Questions were answered to the patient's satisfaction.     Mauri Pole

## 2017-10-16 NOTE — Transfer of Care (Signed)
Immediate Anesthesia Transfer of Care Note  Patient: Paula Ferguson  Procedure(s) Performed: RIGHT TOTAL KNEE RESECTION WITH ANTIBIOTIC SPACER PLACEMENT (N/A Knee)  Patient Location: PACU  Anesthesia Type:General  Level of Consciousness: drowsy, patient cooperative and responds to stimulation  Airway & Oxygen Therapy: Patient Spontanous Breathing and Patient connected to face mask oxygen  Post-op Assessment: Report given to RN and Post -op Vital signs reviewed and stable  Post vital signs: Reviewed and stable  Last Vitals:  Vitals Value Taken Time  BP 141/69 10/16/2017  2:25 PM  Temp    Pulse 101 10/16/2017  2:27 PM  Resp 19 10/16/2017  2:27 PM  SpO2 93 % 10/16/2017  2:27 PM  Vitals shown include unvalidated device data.  Last Pain:  Vitals:   10/16/17 1128  TempSrc:   PainSc: 9       Patients Stated Pain Goal: 7 (33/61/22 [id number])  Complications: No apparent anesthesia complications

## 2017-10-16 NOTE — Progress Notes (Signed)
ANTICOAGULATION CONSULT NOTE  Pharmacy Consult for warfarin Indication: Aortic mechanical valve   Allergies  Allergen Reactions  . Hydromorphone Hcl Itching    "Dilaudid"  . Meperidine Hcl Itching  . Morphine Sulfate Itching  . Percocet [Oxycodone-Acetaminophen] Itching    Patient Measurements: Height: 5\' 2"  (157.5 cm) Weight: 144 lb 2.9 oz (65.4 kg) IBW/kg (Calculated) : 50.1 Heparin Dosing Weight: 55 kg  Vital Signs: Temp: 97.5 F (36.4 C) (08/27 1017) Temp Source: Oral (08/27 1017) BP: 144/60 (08/27 1017) Pulse Rate: 96 (08/27 1017)  Labs: Recent Labs    10/14/17 0451 10/15/17 0409 10/16/17 0517 10/16/17 0722  HGB 8.9* 8.5* 8.3*  --   HCT 25.9* 24.7* 24.0*  --   PLT 144* 177 188  --   LABPROT  --  33.6*  --  17.5*  INR  --  3.34  --  1.44  HEPARINUNFRC 0.55 0.47  --   --   CREATININE 0.78 0.67 0.75  --    Estimated Creatinine Clearance: 53.1 mL/min (by C-G formula based on SCr of 0.75 mg/dL).  Assessment: 77 yo female with PMH of mechanical aortic vale replacement on chronic warfarin anticoagulation.  Admitted on 8/20 s/p fall with rhabdo, and with PMH of prosthetic TKR; found to have MSSA bacteremia and prosthetic joint infection.  Pharmacy was initially consulted to dose warfarin inpatient, but was changed from warfarin to Heparin infusion due to drug-drug interaction with Rifampin.    Last INR 1.88 (on 8/21) was subtherapeutic, last warfarin dose 2.5mg  on 8/20.  Today, 10/16/2017:  INR 1.44 after vitamin K 1 mg IV given 8/26, heparin drip stopped 8/26  To resume coumadin post-op today; home dose 2. 5 mg daily x 5 mg on M/F.     Goal of Therapy:  Heparin level 0.3-0.7 units/ml Monitor platelets by anticoagulation protocol: Yes   Plan:  Coumadin 5 mg po x 1 dose Daily INR Consider LMWH bridge until INR tx ? Change IV rifampin to PO  Eudelia Bunch, Pharm.D [phone] 10/16/2017 1:30 PM

## 2017-10-16 NOTE — H&P (View-Only) (Signed)
Patient ID: Paula Ferguson, female   DOB: 1940-03-18, 77 y.o.   MRN: [id number] Subjective:  Admitted for pain in right knee, infected by aspiration findings    Patient reports pain as mild to moderate  Objective:   VITALS:   Vitals:   10/15/17 2121 10/16/17 0609  BP: (!) 157/64 (!) 132/54  Pulse: (!) 102 96  Resp: 18 18  Temp: 99.3 F (37.4 C)   SpO2: 98% 100%    Neurovascular intact Pain right knee to palpation Mild effusion  LABS Recent Labs    10/14/17 0451 10/15/17 0409 10/16/17 0517  HGB 8.9* 8.5* 8.3*  HCT 25.9* 24.7* 24.0*  WBC 18.8* 24.2* 25.9*  PLT 144* 177 188    Recent Labs    10/14/17 0451 10/15/17 0409 10/16/17 0517  NA 140 137 134*  K 3.8 3.6 3.4*  BUN 29* 29* 32*  CREATININE 0.78 0.67 0.75  GLUCOSE 84 63* 69*    Recent Labs    10/15/17 0409  INR 3.34     Assessment/Plan:  Septic right total knee replacement  Plan: To OR today for resection of right total knee placement of antibiotic spacer Post op ID consult PIC line Type and screen/cross

## 2017-10-16 NOTE — Anesthesia Preprocedure Evaluation (Addendum)
Anesthesia Evaluation  Patient identified by MRN, date of birth, ID band Patient awake    Reviewed: Allergy & Precautions, NPO status , Patient's Chart, lab work & pertinent test results  Airway Mallampati: II  TM Distance: >3 FB Neck ROM: Full    Dental  (+) Dental Advisory Given   Pulmonary neg pulmonary ROS,    breath sounds clear to auscultation       Cardiovascular hypertension, Pt. on medications + CAD and + CABG  + Valvular Problems/Murmurs (s/p mechanical AVR)  Rhythm:Regular Rate:Normal     Neuro/Psych Anxiety Depression negative neurological ROS     GI/Hepatic negative GI ROS, Neg liver ROS,   Endo/Other  Hypothyroidism   Renal/GU negative Renal ROS     Musculoskeletal  (+) Arthritis , Infected right total knee   Abdominal   Peds  Hematology  (+) anemia ,   Anesthesia Other Findings   Reproductive/Obstetrics                            Lab Results  Component Value Date   WBC 25.9 (H) 10/16/2017   HGB 8.3 (L) 10/16/2017   HCT 24.0 (L) 10/16/2017   MCV 93.4 10/16/2017   PLT 188 10/16/2017   Lab Results  Component Value Date   CREATININE 0.75 10/16/2017   BUN 32 (H) 10/16/2017   NA 134 (L) 10/16/2017   K 3.4 (L) 10/16/2017   CL 108 10/16/2017   CO2 20 (L) 10/16/2017    Anesthesia Physical Anesthesia Plan  ASA: III  Anesthesia Plan: General   Post-op Pain Management:    Induction: Intravenous  PONV Risk Score and Plan: 3 and Ondansetron, Dexamethasone, Treatment may vary due to age or medical condition and Midazolam  Airway Management Planned: Oral ETT  Additional Equipment:   Intra-op Plan:   Post-operative Plan: Extubation in OR  Informed Consent: I have reviewed the patients History and Physical, chart, labs and discussed the procedure including the risks, benefits and alternatives for the proposed anesthesia with the patient or authorized representative  who has indicated his/her understanding and acceptance.   Dental advisory given  Plan Discussed with: CRNA  Anesthesia Plan Comments:         Anesthesia Quick Evaluation

## 2017-10-16 NOTE — Brief Op Note (Signed)
10/09/2017 - 10/16/2017  1:54 PM  PATIENT:  Paula Ferguson  77 y.o. female  PRE-OPERATIVE DIAGNOSIS:  infected right total knee  POST-OPERATIVE DIAGNOSIS:  infected right total knee  PROCEDURE:  Procedure(s): RIGHT TOTAL KNEE RESECTION WITH ANTIBIOTIC SPACER PLACEMENT (N/A)  SURGEON:  Surgeon(s) and Role:    Paralee Cancel, MD - Primary  PHYSICIAN ASSISTANT: Nehemiah Massed, PA-C  ANESTHESIA:   general  EBL:  <200   BLOOD ADMINISTERED:none  DRAINS: none   LOCAL MEDICATIONS USED:  MARCAINE     SPECIMEN:  No Specimen  DISPOSITION OF SPECIMEN:  N/A  COUNTS:  YES  TOURNIQUET:   Total Tourniquet Time Documented: Thigh (Right) - 48 minutes Total: Thigh (Right) - 48 minutes   DICTATION: .Other Dictation: Dictation Number [id number]  PLAN OF CARE: Admit to inpatient   PATIENT DISPOSITION:  PACU - hemodynamically stable.   Delay start of Pharmacological VTE agent (>24hrs) due to surgical blood loss or risk of bleeding: no

## 2017-10-16 NOTE — Progress Notes (Signed)
PROGRESS NOTE    Paula Ferguson  WUJ:[id number] DOB: Oct 24, 1940 DOA: 10/09/2017 PCP: Marletta Lor, MD   Brief Narrative:  HPI On 10/09/2017 by Dr. Raiford Noble Paula Ferguson is a 77 y.o. female with medical history significant of CAD s/p CABG x1 and Hx of AVR, HTN, HLD, Hypothyroidism, Anemia, Depression and other comorbidites who presents with worsened Knee pain and a fall.  She has had had Knee Pain Bilateral for at least 6 years and acutely worsened yesterday morning and specifically in her Right Knee. She described it as a stabbing pain. Knee pain is a 10/10 No radiation.  She states that because of her fall she lay on the floor for the whole night because her daughter was unable to pick her up and she refused called EMS.  Daughter family stated that she is calling EMS and they brought her to the emergency room for further evaluation.  He also complained of +Lightheadedness, Dizziness, Weakness and progressively gotten worse the last few months and is more fatigued and exhausted. No CP, Abdominal, No N/V/Diarrhe, no Burning Discomfort but has had increased frequency.  Initial evaluation in the ED showed that she had negative x-rays however she is unable to ambulate and was concerned that she would fall if she even attempted.  TRH was called to admit this patient for her right knee pain and fall as well as mild rhabdomyolysis.  Interim history Admitted for right knee pain and swelling after fall.  Found to have mild rhabdo.  Also noted to have MSSA bacteremia.  Infectious disease consulted.  Ortho consulted.  Cardiology consulted s/p TEE. Pending ortho surgery today. GI Consulted for elevated bilirubin.  Assessment & Plan   MSSA bacteremia with leukocytosis -Initially admitted with WBC of 14.2, was afebrile at that time -Suspect source is right prosthetic knee -Blood cultures showed MSSA -Infectious disease consulted and appreciated -Continue Ancef and rifampin  -Repeat  blood cultures on 10/11/2017 show no growth to date -Cardiology consulted, status post TEE, no vegetations noted -leukocytosis worsening, currently 25.9 (possibly secondary to constipation vs infection) -continue to monitor CBC  Intractable right knee pain with swelling secondary to fall and infection -Orthopedics consulted and appreciated -Right knee x-ray showed no acute fracture or dislocation. -Right hip x-ray showed no evident acute fracture dislocation -CT of the right knee: No acute osseous injury of the right knee.  Right total knee arthroplasty with a thin lucency around the tibial stem component of the arthroplasty measuring less than 1 mm concerning for loosening.  Large right joint effusion with synovial thickening. -Patient was started on prednisone for possible gout flare.  Although she tells me she has no history of gout. Prednisone discontinued  -PT recommending SNF versus home health pending operative course -s/p right knee aspiration, Culture: moderate staph auerus. Gram stain shows abundant GPC -Orthopedics recommending surgery and antibiotic spacer- OR today  Acute rhabdomyolysis -In the setting of fall and trauma -CK on admission 1738, down to 269  Dizziness/lightheadedness/generalized weakness -Has been occurring over the last several months and worsening with postural changes -TSH 2.573 -UA unremarkable  Coronary artery disease/aortic valve replacement -Currently chest pain-free -Continue losartan, verapamil -Coumadin now held given that infectious disease started rifampin -Was placed on heparin -Statin held given rhabdo  Supratherapeutic INR -Despite being off of Coumadin, INR rose to 3.34. Patient was given 1 mg of IV vitamin K.  INR trending downward currently 1.44 -Suspect possibly due to infection or rifampin use  Essential hypertension -Continue  losartan, verapamil  Hyperlipidemia -Statin held due to rhabdomyolysis  Depression -Continue  bupropion  Normocytic anemia -hemoglobin currently 8.3 baseline appears to be approximately 11-12 -Suspect drop in hemoglobin secondary to dilutional component as patient is receiving IV fluids -Continue iron supplementation -Continue to monitor CBC   Hypothyroidism -Continue Synthroid -TSH 2.573  Hypokalemia -Resolved with replacement -Magnesium 1.8 -Continue to monitor   Constipation -resolved with dulcolax  -Suspect secondary to immobility and pain medications  Elevated bilirubin -?rifampin -LFTs WNL -GI consulted and appreciated -Obtained right upper quadrant ultrasound: CBD 9.29mm, liver contour consistent with cirrhosis, mild ascites.  Mild prominence pancreatic duct measuring 3.5 mm distally -Obtained LDH, 293 -Will continue to monitor -question if patient will need MRCP?  DVT Prophylaxis   heparin  Code Status: Full  Family Communication: None at bedside  Disposition Plan: Admitted. Pending extraction of right knee prosthesis and placement of antibiotic spacer by orthopedic surgery today. Disposition pending.  Consultants Orthopedics Infectious disease Cardiology  Procedures  None  Antibiotics   Anti-infectives (From admission, onward)   Start     Dose/Rate Route Frequency Ordered Stop   10/11/17 1000  [MAR Hold]  rifampin (RIFADIN) 300 mg in sodium chloride 0.9 % 100 mL IVPB     (MAR Hold since Tue 10/16/2017 at 1047. Reason: Transfer to a Procedural area.)   300 mg 200 mL/hr over 30 Minutes Intravenous Every 12 hours 10/10/17 1603     10/10/17 1200  [MAR Hold]  ceFAZolin (ANCEF) IVPB 2g/100 mL premix     (MAR Hold since Tue 10/16/2017 at 1047. Reason: Transfer to a Procedural area.)   2 g 200 mL/hr over 30 Minutes Intravenous Every 8 hours 10/10/17 1111        Subjective:   Paula Ferguson seen and examined today.  Denies current chest pain, shortness of breath, abdominal pain, nausea vomiting, diarrhea constipation.  Continues to have some right  knee pain.  Looking forward to surgery today.  Denies history of cirrhosis.  Objective:   Vitals:   10/15/17 2121 10/16/17 0609 10/16/17 0900 10/16/17 1017  BP: (!) 157/64 (!) 132/54  (!) 144/60  Pulse: (!) 102 96 87 96  Resp: 18 18 17 18   Temp: 99.3 F (37.4 C)   (!) 97.5 F (36.4 C)  TempSrc: Oral   Oral  SpO2: 98% 100% 93% 100%  Weight:  65.4 kg    Height:        Intake/Output Summary (Last 24 hours) at 10/16/2017 1110 Last data filed at 10/16/2017 0957 Gross per 24 hour  Intake 1250 ml  Output 200 ml  Net 1050 ml   Filed Weights   10/13/17 2200 10/15/17 0639 10/16/17 0609  Weight: 64.6 kg 64.6 kg 65.4 kg   Exam  General: Well developed, well nourished, NAD, appears stated age  22: NCAT, icteric sclera, mucous membranes moist.   Neck: Supple  Cardiovascular: S1 S2 auscultated, RRR, no murmur  Respiratory: Clear to auscultation bilaterally with equal chest rise  Abdomen: Soft, nontender, nondistended, + bowel sounds  Extremities: warm dry without cyanosis clubbing or edema  Neuro: AAOx3, nonfocal  Psych: appropriate mood and affect, pleasant  Data Reviewed: I have personally reviewed following labs and imaging studies  CBC: Recent Labs  Lab 10/12/17 0619 10/13/17 0514 10/14/17 0451 10/15/17 0409 10/16/17 0517  WBC 9.9 14.2* 18.8* 24.2* 25.9*  NEUTROABS  --   --   --  20.1*  --   HGB 9.8* 9.8* 8.9* 8.5* 8.3*  HCT 28.9*  29.0* 25.9* 24.7* 24.0*  MCV 94.4 94.2 94.5 93.2 93.4  PLT 147* 143* 144* 177 283   Basic Metabolic Panel: Recent Labs  Lab 10/12/17 0619 10/13/17 0514 10/13/17 1311 10/14/17 0451 10/15/17 0409 10/16/17 0517  NA 137 139  --  140 137 134*  K 3.5 3.1*  --  3.8 3.6 3.4*  CL 109 111  --  114* 110 108  CO2 24 23  --  23 21* 20*  GLUCOSE 96 83  --  84 63* 69*  BUN 30* 27*  --  29* 29* 32*  CREATININE 0.78 0.73  --  0.78 0.67 0.75  CALCIUM 8.3* 8.1*  --  7.8* 7.6* 7.7*  MG  --   --  1.8  --   --   --    GFR: Estimated  Creatinine Clearance: 53.1 mL/min (by C-G formula based on SCr of 0.75 mg/dL). Liver Function Tests: Recent Labs  Lab 10/10/17 0538 10/15/17 0409 10/15/17 1350 10/16/17 0517  AST 73* 28  --  27  ALT 29 6  --  6  ALKPHOS 70 79  --  79  BILITOT 2.1* 4.1* 5.0* 6.0*  PROT 6.2* 5.0*  --  4.8*  ALBUMIN 2.3* 1.7*  --  1.7*   No results for input(s): LIPASE, AMYLASE in the last 168 hours. No results for input(s): AMMONIA in the last 168 hours. Coagulation Profile: Recent Labs  Lab 10/10/17 0538 10/15/17 0409 10/16/17 0722  INR 1.88 3.34 1.44   Cardiac Enzymes: Recent Labs  Lab 10/11/17 1432 10/13/17 1311  CKTOTAL 269* 70   BNP (last 3 results) No results for input(s): PROBNP in the last 8760 hours. HbA1C: No results for input(s): HGBA1C in the last 72 hours. CBG: Recent Labs  Lab 10/14/17 0746 10/15/17 0735 10/15/17 0853 10/16/17 0748 10/16/17 1031  GLUCAP 70 66* 89 61* 83   Lipid Profile: No results for input(s): CHOL, HDL, LDLCALC, TRIG, CHOLHDL, LDLDIRECT in the last 72 hours. Thyroid Function Tests: No results for input(s): TSH, T4TOTAL, FREET4, T3FREE, THYROIDAB in the last 72 hours. Anemia Panel: No results for input(s): VITAMINB12, FOLATE, FERRITIN, TIBC, IRON, RETICCTPCT in the last 72 hours. Urine analysis:    Component Value Date/Time   COLORURINE AMBER (A) 10/10/2017 0218   APPEARANCEUR CLEAR 10/10/2017 0218   LABSPEC 1.027 10/10/2017 0218   PHURINE 5.0 10/10/2017 0218   GLUCOSEU NEGATIVE 10/10/2017 0218   HGBUR SMALL (A) 10/10/2017 0218   HGBUR negative 04/09/2008 1252   BILIRUBINUR NEGATIVE 10/10/2017 0218   BILIRUBINUR n 03/01/2017 1544   KETONESUR NEGATIVE 10/10/2017 0218   PROTEINUR NEGATIVE 10/10/2017 0218   UROBILINOGEN 1.0 03/01/2017 1544   UROBILINOGEN 1.0 03/14/2011 1520   NITRITE NEGATIVE 10/10/2017 0218   LEUKOCYTESUR NEGATIVE 10/10/2017 0218   Sepsis Labs: @LABRCNTIP (procalcitonin:4,lacticidven:4)  ) Recent Results (from the  past 240 hour(s))  Culture, blood (routine x 2)     Status: Abnormal   Collection Time: 10/09/17 12:29 PM  Result Value Ref Range Status   Specimen Description   Final    BLOOD RIGHT ANTECUBITAL Performed at [facility], [place] [address] [place], [place] [address]    Special Requests   Final    BOTTLES DRAWN AEROBIC AND ANAEROBIC Blood Culture results may not be optimal due to an inadequate volume of blood received in culture bottles Performed at [facility] [address], [place], [place] [address]    Culture  Setup Time   Final  GRAM POSITIVE COCCI IN BOTH AEROBIC AND ANAEROBIC BOTTLES CRITICAL VALUE NOTED.  VALUE IS CONSISTENT WITH PREVIOUSLY REPORTED AND CALLED VALUE.    Culture (A)  Final    STAPHYLOCOCCUS AUREUS SUSCEPTIBILITIES PERFORMED ON PREVIOUS CULTURE WITHIN THE LAST 5 DAYS. Performed at [facility], [place] [address], [place], [place] [address]    Report Status 10/12/2017 FINAL  Final  Culture, blood (routine x 2)     Status: Abnormal   Collection Time: 10/09/17 12:34 PM  Result Value Ref Range Status   Specimen Description   Final    BLOOD LEFT ANTECUBITAL Performed at [facility] [address], [place], [place] [address]    Special Requests   Final    BOTTLES DRAWN AEROBIC AND ANAEROBIC Blood Culture adequate volume Performed at [facility] [address] [place], [place] [address]    Culture  Setup Time   Final    GRAM POSITIVE COCCI IN CLUSTERS IN BOTH AEROBIC AND ANAEROBIC BOTTLES CRITICAL RESULT CALLED TO, READ BACK BY AND VERIFIED WITH: Karel Jarvis PharmD 9:50 10/10/17 ([email or web address]) Performed at [facility], [place] [address] [place], [place] [address]    Culture STAPHYLOCOCCUS AUREUS (A)  Final   Report Status 10/12/2017 FINAL  Final   Organism ID, Bacteria STAPHYLOCOCCUS AUREUS  Final      Susceptibility   Staphylococcus aureus - MIC*    CIPROFLOXACIN <=0.5  SENSITIVE Sensitive     ERYTHROMYCIN >=8 RESISTANT Resistant     GENTAMICIN <=0.5 SENSITIVE Sensitive     OXACILLIN 0.5 SENSITIVE Sensitive     TETRACYCLINE <=1 SENSITIVE Sensitive     VANCOMYCIN <=0.5 SENSITIVE Sensitive     TRIMETH/SULFA <=10 SENSITIVE Sensitive     CLINDAMYCIN <=0.25 SENSITIVE Sensitive     RIFAMPIN <=0.5 SENSITIVE Sensitive     Inducible Clindamycin NEGATIVE Sensitive     * STAPHYLOCOCCUS AUREUS  Blood Culture ID Panel (Reflexed)     Status: Abnormal   Collection Time: 10/09/17 12:34 PM  Result Value Ref Range Status   Enterococcus species NOT DETECTED NOT DETECTED Final   Listeria monocytogenes NOT DETECTED NOT DETECTED Final   Staphylococcus species DETECTED (A) NOT DETECTED Final    Comment: CRITICAL RESULT CALLED TO, READ BACK BY AND VERIFIED WITH: Karel Jarvis PharmD 9:50 10/10/17 ([email or web address])    Staphylococcus aureus DETECTED (A) NOT DETECTED Final    Comment: Methicillin (oxacillin) susceptible Staphylococcus aureus (MSSA). Preferred therapy is anti staphylococcal beta lactam antibiotic (Cefazolin or Nafcillin), unless clinically contraindicated. CRITICAL RESULT CALLED TO, READ BACK BY AND VERIFIED WITH: Karel Jarvis PharmD 9:50 10/10/17 ([email or web address])    Methicillin resistance NOT DETECTED NOT DETECTED Final   Streptococcus species NOT DETECTED NOT DETECTED Final   Streptococcus agalactiae NOT DETECTED NOT DETECTED Final   Streptococcus pneumoniae NOT DETECTED NOT DETECTED Final   Streptococcus pyogenes NOT DETECTED NOT DETECTED Final   Acinetobacter baumannii NOT DETECTED NOT DETECTED Final   Enterobacteriaceae species NOT DETECTED NOT DETECTED Final   Enterobacter cloacae complex NOT DETECTED NOT DETECTED Final   Escherichia coli NOT DETECTED NOT DETECTED Final   Klebsiella oxytoca NOT DETECTED NOT DETECTED Final   Klebsiella pneumoniae NOT DETECTED NOT DETECTED Final   Proteus species NOT DETECTED NOT DETECTED Final   Serratia marcescens NOT DETECTED NOT DETECTED  Final   Haemophilus influenzae NOT DETECTED NOT DETECTED Final   Neisseria meningitidis NOT DETECTED NOT DETECTED Final   Pseudomonas aeruginosa NOT DETECTED NOT DETECTED Final   Candida  albicans NOT DETECTED NOT DETECTED Final   Candida glabrata NOT DETECTED NOT DETECTED Final   Candida krusei NOT DETECTED NOT DETECTED Final   Candida parapsilosis NOT DETECTED NOT DETECTED Final   Candida tropicalis NOT DETECTED NOT DETECTED Final    Comment: Performed at [facility], [place] [address] [place], [place] [address]  Urine culture     Status: Abnormal   Collection Time: 10/10/17  2:18 AM  Result Value Ref Range Status   Specimen Description   Final    URINE, RANDOM Performed at [facility] [address] [place], [place] [address]    Special Requests   Final    NONE Performed at [facility], [place] [address] [place], [place] [address]    Culture MULTIPLE SPECIES PRESENT, SUGGEST RECOLLECTION (A)  Final   Report Status 10/12/2017 FINAL  Final  Aerobic/Anaerobic Culture (surgical/deep wound)     Status: None   Collection Time: 10/10/17  5:45 PM  Result Value Ref Range Status   Specimen Description   Final    SYNOVIAL Performed at [facility] [address] [place], [place] [address]    Special Requests   Final    NONE Performed at [facility], [place] [address], [place], [place] [address]    Gram Stain   Final    ABUNDANT WBC PRESENT, PREDOMINANTLY PMN ABUNDANT GRAM POSITIVE COCCI IN PAIRS IN CHAINS IN CLUSTERS Gram Stain Report Called to,Read Back By and Verified With: S.WRIGHT AT 1929 ON 10/10/17 BY N.THOMPSON Performed at [facility], [place] [address] [place], [place] [address]    Culture   Final    MODERATE STAPHYLOCOCCUS AUREUS NO ANAEROBES ISOLATED Performed at [facility], [place] [address] [place], [place] [address]    Report Status 10/15/2017 FINAL  Final    Organism ID, Bacteria STAPHYLOCOCCUS AUREUS  Final      Susceptibility   Staphylococcus aureus - MIC*    CIPROFLOXACIN <=0.5 SENSITIVE Sensitive     ERYTHROMYCIN >=8 RESISTANT Resistant     GENTAMICIN <=0.5 SENSITIVE Sensitive     OXACILLIN 0.5 SENSITIVE Sensitive     TETRACYCLINE <=1 SENSITIVE Sensitive     VANCOMYCIN 1 SENSITIVE Sensitive     TRIMETH/SULFA <=10 SENSITIVE Sensitive     CLINDAMYCIN <=0.25 SENSITIVE Sensitive     RIFAMPIN <=0.5 SENSITIVE Sensitive     Inducible Clindamycin NEGATIVE Sensitive     * MODERATE STAPHYLOCOCCUS AUREUS  Culture, blood (routine x 2)     Status: None   Collection Time: 10/11/17  1:09 PM  Result Value Ref Range Status   Specimen Description   Final    BLOOD RIGHT ANTECUBITAL Performed at [facility] [address], [place], [place] [address]    Special Requests   Final    BOTTLES DRAWN AEROBIC AND ANAEROBIC Blood Culture adequate volume Performed at [facility] [address], [place], [place] [address]    Culture   Final    NO GROWTH 5 DAYS Performed at [facility], [place] [address], [place], [place] [address]    Report Status 10/16/2017 FINAL  Final  Culture, blood (routine x 2)     Status: None   Collection Time: 10/11/17  1:13 PM  Result Value Ref Range Status   Specimen Description   Final    BLOOD BLOOD RIGHT FOREARM Performed at [facility] [address]  [place], [place] [address]    Special Requests   Final    BOTTLES DRAWN AEROBIC AND ANAEROBIC Blood Culture adequate volume Performed at [facility] [address], [place], [place] [address]    Culture   Final    NO GROWTH 5 DAYS Performed at [facility], [place] [address] [place], [place] [address]    Report Status 10/16/2017 FINAL  Final      Radiology Studies: Dg Chest Port 1 View  Result Date: 10/14/2017 CLINICAL DATA:  Lightheadedness, dizziness, weakness, worsening  symptoms over the last few months. EXAM: PORTABLE CHEST 1 VIEW COMPARISON:  Chest x-rays dated 10/09/2017 and 03/29/2017. FINDINGS: Heart size and mediastinal contours are stable. Median sternotomy wires appear intact and stable in alignment. Cardiac valve hardware appears stable in position. Probable mild atelectasis at the LEFT lung base. Blunting of the costophrenic angles bilaterally, likely chronic pleural thickening. Lungs otherwise clear. No acute or suspicious osseous finding. IMPRESSION: No active disease.  No evidence of pneumonia or pulmonary edema. Electronically Signed   By: Franki Cabot M.D.   On: 10/14/2017 16:38   [place] Ekg Site Rite  Result Date: 10/16/2017 If Site Rite image not attached, placement could not be confirmed due to current cardiac rhythm.  US Abdomen Limited Ruq  Result Date: 10/16/2017 CLINICAL DATA:  Elevated bilirubin EXAM: ULTRASOUND ABDOMEN LIMITED RIGHT UPPER QUADRANT COMPARISON:  None. FINDINGS: Gallbladder: Surgically absent Common bile duct: Diameter: 9.4 mm, upper normal for post cholecystectomy Liver: Nodular liver contour consistent with cirrhosis. No focal liver lesion. Mild ascites around the liver. Small right pleural effusion. Portal vein is patent on color Doppler imaging with normal direction of blood flow towards the liver. Limited imaging of the pancreas feels no mass lesion mild prominence of the pancreatic duct measuring 3.5 mm. IMPRESSION: Common bile duct 9.4 mm, upper normal for post cholecystectomy Nodular liver contour consistent with cirrhosis.  Mild ascites Small right pleural effusion Mild prominence pancreatic duct measuring 3.5 mm distally. Electronically Signed   By: Franchot Gallo M.D.   On: 10/16/2017 09:54     Scheduled Meds: . [MAR Hold] buPROPion  150 mg Oral Daily  . chlorhexidine  60 mL Topical Once  . [MAR Hold] docusate sodium  100 mg Oral BID  . [MAR Hold] ferrous sulfate  325 mg Oral Daily  . [MAR Hold] levothyroxine  100 mcg  Oral QAC breakfast  . [MAR Hold] lidocaine  10 mL Intradermal Once  . [MAR Hold] losartan  100 mg Oral Daily  . [MAR Hold] potassium chloride  40 mEq Oral Once  . povidone-iodine  2 application Topical Once  . [MAR Hold] verapamil  120 mg Oral QHS   Continuous Infusions: . [MAR Hold] sodium chloride Stopped (10/13/17 2150)  . [MAR Hold]  ceFAZolin (ANCEF) IV Stopped (10/16/17 0419)  . [MAR Hold] rifampin (RIFADIN) IVPB 300 mg (10/16/17 0915)  . tranexamic acid       LOS: 6 days   Time Spent in minutes   45 minutes (greater than 50% of time spent with patient face to face, as well as reviewing old records, calling consults, and formulating a plan)  Matricia Begnaud D.O. on 10/16/2017 at 11:10 AM  Between 7am to 7pm - Please see pager noted on [email or web address]  After 7pm go to [email or web address]  And look for the night coverage person covering for me after hours  [facility] Office  [phone]

## 2017-10-16 NOTE — Progress Notes (Signed)
OT Cancellation Note  Patient Details Name: Paula Ferguson MRN: [id number] DOB: 04-08-1940   Cancelled Treatment:    Reason Eval/Treat Not Completed: Patient at procedure or test/ unavailable (OR); will follow up as able.  Lou Cal, OT Pager [phone] 10/16/2017   Raymondo Band 10/16/2017, 10:56 AM

## 2017-10-16 NOTE — Progress Notes (Signed)
Pt blood sugar 61. MD paged awaiting call back.

## 2017-10-16 NOTE — Progress Notes (Signed)
Patient ID: Paula Ferguson, female   DOB: Apr 27, 1940, 77 y.o.   MRN: [id number] Subjective:  Admitted for pain in right knee, infected by aspiration findings    Patient reports pain as mild to moderate  Objective:   VITALS:   Vitals:   10/15/17 2121 10/16/17 0609  BP: (!) 157/64 (!) 132/54  Pulse: (!) 102 96  Resp: 18 18  Temp: 99.3 F (37.4 C)   SpO2: 98% 100%    Neurovascular intact Pain right knee to palpation Mild effusion  LABS Recent Labs    10/14/17 0451 10/15/17 0409 10/16/17 0517  HGB 8.9* 8.5* 8.3*  HCT 25.9* 24.7* 24.0*  WBC 18.8* 24.2* 25.9*  PLT 144* 177 188    Recent Labs    10/14/17 0451 10/15/17 0409 10/16/17 0517  NA 140 137 134*  K 3.8 3.6 3.4*  BUN 29* 29* 32*  CREATININE 0.78 0.67 0.75  GLUCOSE 84 63* 69*    Recent Labs    10/15/17 0409  INR 3.34     Assessment/Plan:  Septic right total knee replacement  Plan: To OR today for resection of right total knee placement of antibiotic spacer Post op ID consult PIC line Type and screen/cross

## 2017-10-16 NOTE — Anesthesia Postprocedure Evaluation (Signed)
Anesthesia Post Note  Patient: Paula Ferguson  Procedure(s) Performed: RIGHT TOTAL KNEE RESECTION WITH ANTIBIOTIC SPACER PLACEMENT (N/A Knee)     Patient location during evaluation: PACU Anesthesia Type: General Level of consciousness: awake and alert Pain management: pain level controlled Vital Signs Assessment: post-procedure vital signs reviewed and stable Respiratory status: spontaneous breathing, nonlabored ventilation, respiratory function stable and patient connected to nasal cannula oxygen Cardiovascular status: blood pressure returned to baseline and stable Postop Assessment: no apparent nausea or vomiting Anesthetic complications: no    Last Vitals:  Vitals:   10/16/17 1510 10/16/17 1610  BP: 133/67 (!) 141/75  Pulse: 94 95  Resp: 16 18  Temp: 37.4 C 36.9 C  SpO2: 93% 96%    Last Pain:  Vitals:   10/16/17 1610  TempSrc: Oral  PainSc:                  Tiajuana Amass

## 2017-10-16 NOTE — Op Note (Signed)
NAME: Paula Ferguson, [facility]DAY MEDICAL RECORD [id number] ACCOUNT [id number] DATE OF BIRTH:04/08/1940 FACILITY: [facility] LOCATION: [facility] PHYSICIAN:Emslee Lopezmartinez Marian Sorrow, MD  OPERATIVE REPORT  DATE OF PROCEDURE:  10/16/2017  PREOPERATIVE DIAGNOSIS:  Septic right total knee arthroplasty.  POSTOPERATIVE DIAGNOSIS:  Septic right total knee arthroplasty.  PROCEDURE:  Resection right total knee replacement, placement of antibiotic articulating spacer utilizing newly open size 3 posterior stabilized femur with a size 3 x 17.5 rotating platform insert and a 38 patellar button.  These were inserted following  debridement procedure utilizing antibiotic cement which was 1 gram of vancomycin and 1.2 of tobramycin per batch of cement.  SURGEON:  Paralee Cancel, MD  ASSISTANT:  Nehemiah Massed, PA-C.  ANESTHESIA:  General.  SPECIMENS:  No specimen taken as previous aspiration had already identified specimen.  DRAINS:  None.  ESTIMATED BLOOD LOSS:  Less than 100.  TOURNIQUET:  47 minutes at 300 mmHg.  COMPLICATIONS:  None.  INDICATIONS:  The patient is a 77 year old patient of mine.  She had a history of a right total knee replacement.  We have been following her in the office with a painful right total knee as well as a painful left partial knee after partial knee arthroplasty.  Her right knee studies in the past have been negative including radiographic and bone scan evaluation;  however, she presented to the hospital a week ago with fever, increased pain and swelling.  An aspiration at the time revealed purulent fluid with gram-positive cocci.  She was admitted to the hospital for management of her acute injury as well as this  infection with plans for resection when I was able to get to her as opposed to one of my partners.  Surgical risks and benefits and the procedure were discussed including a staged procedure of antibiotic spacer followed by reinsertion of a total knee  arthroplasty.  The duration  of antibiotics perioperatively as well as long-term were reviewed.  Consent was obtained.  DESCRIPTION OF PROCEDURE:  The patient was brought to the operative theater.  Once adequate anesthesia, preoperative antibiotics were administered, which was Ancef that she is already on.  She was positioned supine with a right thigh tourniquet placed.   The right lower extremity was then prepped and draped in sterile fashion.  Timeout was performed identifying the patient, the planned procedure and extremity.  Leg was exsanguinated, tourniquet elevated to 300 mmHg due to systolic pressures in the 140s.  The old incision was excised and extended proximal and distal for soft tissue exposure.  The medial arthrotomy was then made encountering purulent fluid consistent with Staphylococcus infection.  Following initial evaluation of the knee, medial  suprapatellar and lateral synovectomies were carried out.  The knee was then flexed and the patella subluxated laterally.  I used a thin ACL and undermined the bones in the interface and was able to easily elevate the tibial component.  I then did the  same procedure with a thin ACL saw on the bone cement in the interface on the femoral component.  The femoral component was removed without significant bone loss other than from perhaps some chronicity of this infection and then the tibial component.  I  removed remaining cement and performed a debridement of nonviable tissues in this area.  I then opened up the canals and used a canal brush irrigator and irrigated the canals out with about 1500 mL of normal saline solution.  The patella was then removed.  The remaining patellar bone was soft and thin.  I then decided that I would place a patellar button on this at the end of the case to prevent fracture.  We irrigated the knee with normal saline solution.  I did mix a single batch of cement with 1 gram of vancomycin and 1.2 of tobramycin and when this cured, I placed this  into the distal femur.  I then opened up a size 3 femur, which we removed and after  doing a trial, determined I would use a 17 mm insert.  We opened up a 17 mm x 3 posterior stabilized rotating platform insert that we would place into the tibia.  The knee was copiously irrigated with normal saline solution, 4.5 liters at this point,  while two batches of cement were mixed on the back table.  Components were then cemented into place and the knee brought into extension to allow the cement to cure including the patella button.  Once the cement had cured, excessive cement was removed.   We reirrigated the knee with the remaining solution.  I then placed about 750 mL of vancomycin powder into the knee and then reapproximated the extensor mechanism using #1 Vicryl and #1 Stratafix suture.  The remainder of the wound was closed with 2-0  Vicryl and a running Monocryl stitch.  The knee was then cleaned, dried and sterilely prepped with sterile glue and Aquacel dressing.  The knee was then wrapped in a bulky dressing and a knee immobilizer.  She was then brought to the recovery room in  stable condition, tolerating the procedure well.  We will readmit her back to the hospital where she will receive a PICC line.  We will have an ID consult for duration of antibiotics and following the postoperative period.  TN/NUANCE  D:10/16/2017 T:10/16/2017 JOB:[id number]

## 2017-10-17 ENCOUNTER — Encounter (HOSPITAL_COMMUNITY): Payer: Self-pay | Admitting: Orthopedic Surgery

## 2017-10-17 ENCOUNTER — Inpatient Hospital Stay (HOSPITAL_COMMUNITY): Payer: Medicare Other

## 2017-10-17 DIAGNOSIS — R7881 Bacteremia: Secondary | ICD-10-CM

## 2017-10-17 DIAGNOSIS — D72829 Elevated white blood cell count, unspecified: Secondary | ICD-10-CM

## 2017-10-17 DIAGNOSIS — T8450XA Infection and inflammatory reaction due to unspecified internal joint prosthesis, initial encounter: Secondary | ICD-10-CM

## 2017-10-17 DIAGNOSIS — K8689 Other specified diseases of pancreas: Secondary | ICD-10-CM

## 2017-10-17 DIAGNOSIS — R188 Other ascites: Secondary | ICD-10-CM

## 2017-10-17 DIAGNOSIS — R17 Unspecified jaundice: Secondary | ICD-10-CM

## 2017-10-17 DIAGNOSIS — Z952 Presence of prosthetic heart valve: Secondary | ICD-10-CM

## 2017-10-17 DIAGNOSIS — R749 Abnormal serum enzyme level, unspecified: Secondary | ICD-10-CM

## 2017-10-17 DIAGNOSIS — I10 Essential (primary) hypertension: Secondary | ICD-10-CM

## 2017-10-17 DIAGNOSIS — I2510 Atherosclerotic heart disease of native coronary artery without angina pectoris: Secondary | ICD-10-CM

## 2017-10-17 DIAGNOSIS — K7460 Unspecified cirrhosis of liver: Secondary | ICD-10-CM

## 2017-10-17 LAB — COMPREHENSIVE METABOLIC PANEL
ALBUMIN: 1.5 g/dL — AB (ref 3.5–5.0)
ALT: 5 U/L (ref 0–44)
AST: 27 U/L (ref 15–41)
Alkaline Phosphatase: 86 U/L (ref 38–126)
Anion gap: 4 — ABNORMAL LOW (ref 5–15)
BILIRUBIN TOTAL: 5.8 mg/dL — AB (ref 0.3–1.2)
BUN: 39 mg/dL — AB (ref 8–23)
CO2: 21 mmol/L — ABNORMAL LOW (ref 22–32)
CREATININE: 0.98 mg/dL (ref 0.44–1.00)
Calcium: 7.4 mg/dL — ABNORMAL LOW (ref 8.9–10.3)
Chloride: 110 mmol/L (ref 98–111)
GFR calc Af Amer: >60 mL/min (ref >60)
GFR, EST NON AFRICAN AMERICAN: 55 mL/min — AB (ref >60)
GLUCOSE: 100 mg/dL — AB (ref 70–99)
POTASSIUM: 4.2 mmol/L (ref 3.5–5.1)
Sodium: 135 mmol/L (ref 135–145)
Total Protein: 4.5 g/dL — ABNORMAL LOW (ref 6.5–8.1)

## 2017-10-17 LAB — HEPATIC FUNCTION PANEL
ALBUMIN: 1.4 g/dL — AB (ref 3.5–5.0)
ALK PHOS: 92 U/L (ref 38–126)
ALT: 5 U/L (ref 0–44)
AST: 30 U/L (ref 15–41)
BILIRUBIN TOTAL: 5.6 mg/dL — AB (ref 0.3–1.2)
Bilirubin, Direct: 3.5 mg/dL — ABNORMAL HIGH (ref 0.0–0.2)
Indirect Bilirubin: 2.1 mg/dL — ABNORMAL HIGH (ref 0.3–0.9)
Total Protein: 4.5 g/dL — ABNORMAL LOW (ref 6.5–8.1)

## 2017-10-17 LAB — CBC
HEMATOCRIT: 21.7 % — AB (ref 36.0–46.0)
HEMOGLOBIN: 7.2 g/dL — AB (ref 12.0–15.0)
MCH: 32.3 pg (ref 26.0–34.0)
MCHC: 33.2 g/dL (ref 30.0–36.0)
MCV: 97.3 fL (ref 78.0–100.0)
Platelets: 195 10*3/uL (ref 150–400)
RBC: 2.23 MIL/uL — AB (ref 3.87–5.11)
RDW: 18.4 % — AB (ref 11.5–15.5)
WBC: 36.1 10*3/uL — AB (ref 4.0–10.5)

## 2017-10-17 LAB — GLUCOSE, CAPILLARY: Glucose-Capillary: 85 mg/dL (ref 70–99)

## 2017-10-17 LAB — DIFFERENTIAL
Basophils Absolute: 0.1 10*3/uL (ref 0.0–0.1)
Basophils Relative: 0 %
EOS PCT: 0 %
Eosinophils Absolute: 0.1 10*3/uL (ref 0.0–0.7)
LYMPHS ABS: 1.6 10*3/uL (ref 0.7–4.0)
LYMPHS PCT: 4 %
MONO ABS: 2.4 10*3/uL (ref 0.1–1.0)
MONOS PCT: 7 %
Neutro Abs: 32.7 10*3/uL (ref 1.7–7.7)
Neutrophils Relative %: 89 %

## 2017-10-17 LAB — HAPTOGLOBIN

## 2017-10-17 LAB — PREPARE RBC (CROSSMATCH)

## 2017-10-17 LAB — PROTIME-INR
INR: 1.6
PROTHROMBIN TIME: 18.9 s — AB (ref 11.4–15.2)

## 2017-10-17 MED ORDER — SODIUM CHLORIDE 0.9% IV SOLUTION
Freq: Once | INTRAVENOUS | Status: AC
  Administered 2017-10-17: 09:00:00 via INTRAVENOUS

## 2017-10-17 MED ORDER — SODIUM CHLORIDE 0.9% FLUSH
10.0000 mL | INTRAVENOUS | Status: DC | PRN
  Administered 2017-10-17: 20 mL
  Administered 2017-10-18: 40 mL
  Administered 2017-10-19 – 2017-10-21 (×2): 10 mL
  Filled 2017-10-17 (×4): qty 40

## 2017-10-17 MED ORDER — GADOBENATE DIMEGLUMINE 529 MG/ML IV SOLN
15.0000 mL | Freq: Once | INTRAVENOUS | Status: AC | PRN
  Administered 2017-10-17: 14 mL via INTRAVENOUS

## 2017-10-17 MED ORDER — WARFARIN SODIUM 5 MG PO TABS
5.0000 mg | ORAL_TABLET | Freq: Once | ORAL | Status: AC
  Administered 2017-10-17: 5 mg via ORAL
  Filled 2017-10-17: qty 1

## 2017-10-17 MED ORDER — ENOXAPARIN SODIUM 40 MG/0.4ML ~~LOC~~ SOLN
40.0000 mg | SUBCUTANEOUS | Status: DC
  Administered 2017-10-18: 40 mg via SUBCUTANEOUS
  Filled 2017-10-17: qty 0.4

## 2017-10-17 NOTE — Progress Notes (Signed)
[facility]    Date of Admission:  10/09/2017   Total days of antibiotics8        Day 8 cefazolin           ID: Paula Ferguson is a 77 y.o. female with mssa bacteremia with right knee PJI s/p arthroplasty resection and abtx spacer. In the last few days having hyperbilirubinemia and leukocytosis Active Problems:   Hypothyroidism   Hyperlipidemia   ANEMIA   Essential hypertension   Coronary atherosclerosis   S/P right knee replacement   Anticoagulation goal of INR 2.5 to 3.5   History of mechanical aortic valve replacement   Chronic pain   Knee pain, acute   Fall   Leukocytosis   Rhabdomyolysis   Dizziness   Lightheadedness   Generalized weakness   S/P total knee arthroplasty   Elevated bilirubin   Other ascites   Pancreatic duct dilated    Subjective: Feeling slightly better. Less arm pain, tearful that her daughters are fighting.   Medications:  . buPROPion  150 mg Oral Daily  . celecoxib  200 mg Oral BID  . docusate sodium  100 mg Oral BID  . [START ON 10/18/2017] enoxaparin (LOVENOX) injection  40 mg Subcutaneous Q24H  . ferrous sulfate  325 mg Oral Daily  . levothyroxine  100 mcg Oral QAC breakfast  . lidocaine  10 mL Intradermal Once  . losartan  100 mg Oral Daily  . mouth rinse  15 mL Mouth Rinse BID  . polyethylene glycol  17 g Oral BID  . potassium chloride  40 mEq Oral Once  . verapamil  120 mg Oral QHS  . warfarin  5 mg Oral ONCE-1800  . Warfarin - Pharmacist Dosing Inpatient   Does not apply q1800    Objective: Vital signs in last 24 hours: Temp:  [97.5 F (36.4 C)-99.3 F (37.4 C)] 98.4 F (36.9 C) (08/28 1543) Pulse Rate:  [84-98] 89 (08/28 1543) Resp:  [15-20] 15 (08/28 1543) BP: (101-141)/(51-73) 129/65 (08/28 1543) SpO2:  [94 %-100 %] 98 % (08/28 1543) Weight:  [71.7 kg] 71.7 kg (08/28 0500) Physical Exam  Constitutional:  oriented to person, place, and time. appears well-developed and well-nourished. No  distress.  HENT: [place]/AT, PERRLA, mild scleral icterus Mouth/Throat: Oropharynx is clear and moist. No oropharyngeal exudate.  Cardiovascular: Normal rate, regular rhythm and mechanical click of valve Pulmonary/Chest: Effort normal and breath sounds normal. No respiratory distress.  has no wheezes.  Neck = supple, no nuchal rigidity Abdominal: Soft. Bowel sounds are normal.  exhibits no distension. There is no tenderness.  Ext: left arm pitting edema, dependent echymosis. Right and left leg edema-anasarca. Right knee is wrapped from surgery Neurological: alert and oriented to person, place, and time.  Skin: Skin is warm and dry. No rash noted. No erythema.  Psychiatric: tearful    Lab Results Recent Labs    10/16/17 0517 10/17/17 0453  WBC 25.9* 36.1*  HGB 8.3* 7.2*  HCT 24.0* 21.7*  NA 134* 135  K 3.4* 4.2  CL 108 110  CO2 20* 21*  BUN 32* 39*  CREATININE 0.75 0.98   Liver Panel Recent Labs    10/15/17 1350 10/16/17 0517 10/17/17 0453  PROT  --  4.8* 4.5*  4.5*  ALBUMIN  --  1.7* 1.4*  1.5*  AST  --  27 30  27   ALT  --  6 5  5   ALKPHOS  --  79 92  86  BILITOT 5.0* 6.0* 5.6*  5.8*  BILIDIR 3.2*  --  3.5*  IBILI 1.8*  --  2.1*    Microbiology: reviewed Studies/Results: [place] Ekg Site Rite  Result Date: 10/16/2017 If Site Rite image not attached, placement could not be confirmed due to current cardiac rhythm.  US Abdomen Limited Ruq  Result Date: 10/16/2017 CLINICAL DATA:  Elevated bilirubin EXAM: ULTRASOUND ABDOMEN LIMITED RIGHT UPPER QUADRANT COMPARISON:  None. FINDINGS: Gallbladder: Surgically absent Common bile duct: Diameter: 9.4 mm, upper normal for post cholecystectomy Liver: Nodular liver contour consistent with cirrhosis. No focal liver lesion. Mild ascites around the liver. Small right pleural effusion. Portal vein is patent on color Doppler imaging with normal direction of blood flow towards the liver. Limited imaging of the pancreas feels no mass lesion  mild prominence of the pancreatic duct measuring 3.5 mm. IMPRESSION: Common bile duct 9.4 mm, upper normal for post cholecystectomy Nodular liver contour consistent with cirrhosis.  Mild ascites Small right pleural effusion Mild prominence pancreatic duct measuring 3.5 mm distally. Electronically Signed   By: Franchot Gallo M.D.   On: 10/16/2017 09:54     Assessment/Plan:  MSSA bacteremia with right knee pji = continue on cefazolin. Repeat blood cx is negative. OK FOR PICC LINE PLACEMENT  Hyperbilirubinemia = slightly improved. Have stopped rifampin. Will still recommend continue to follow tbili. Appreciate gi input. Getting mrcp to evaluated prominent PD.   Leukocytosis = have added differential. Appears still c/w stress/leukamoid reaction. Continue to monitor. If she starts having diarrhea would have to see if meets definition to test for cdiff.   Post operative anemia = getting 2 units RBC  Left arm swelling/edema = has pitting edema, with echymosis. Unsure if her PIV extravigation  Hypoalbumin/malnutrition = likely contributing to anasarca/third-spacing on exam  [facility] Cell: [phone] Pager: [phone]  10/17/2017, 5:28 PM

## 2017-10-17 NOTE — Progress Notes (Signed)
     Subjective: 1 Day Post-Op Procedure(s) (LRB): RIGHT TOTAL KNEE RESECTION WITH ANTIBIOTIC SPACER PLACEMENT (N/A)   Patient reports pain as moderate, controlled with medication.  C/o dizziness and weakness, otherwise no events throughout the night. Dr Alvan Dame discussed the procedure and findings. Reviewed labs and discussed blood transfusion.    Objective:   VITALS:   Vitals:   10/17/17 0253 10/17/17 0540  BP: (!) 115/59 116/60  Pulse: 91 84  Resp: 15   Temp: 98 F (36.7 C) 98.5 F (36.9 C)  SpO2: 94% 98%    Dorsiflexion/Plantar flexion intact Incision: dressing C/D/I No cellulitis present Compartment soft  LABS Recent Labs    10/15/17 0409 10/16/17 0517 10/17/17 0453  HGB 8.5* 8.3* 7.2*  HCT 24.7* 24.0* 21.7*  WBC 24.2* 25.9* 36.1*  PLT 177 188 195    Recent Labs    10/15/17 0409 10/16/17 0517 10/17/17 0453  NA 137 134* 135  K 3.6 3.4* 4.2  BUN 29* 32* 39*  CREATININE 0.67 0.75 0.98  GLUCOSE 63* 69* 100*     Assessment/Plan: 1 Day Post-Op Procedure(s) (LRB): RIGHT TOTAL KNEE RESECTION WITH ANTIBIOTIC SPACER PLACEMENT (N/A) Advance diet Up with therapy Discharge disposition to be determined  ABLA  Transfuse 2 units of blood Also treat with iron and will observe  Overweight (BMI 25-29.9) Estimated body mass index is 28.9 kg/m as calculated from the following:   Height as of this encounter: 5\' 2"  (1.575 m).   Weight as of this encounter: 71.7 kg. Patient also counseled that weight may inhibit the healing process Patient counseled that losing weight will help with future health issues      [place] Pugh. Jayden Kratochvil   PAC  10/17/2017, 8:07 AM

## 2017-10-17 NOTE — Progress Notes (Signed)
Occupational Therapy Treatment Patient Details Name: Paula Ferguson MRN: [id number] DOB: Mar 20, 1940 Today's Date: 10/17/2017    History of present illness 77 y.o. female with medical history significant of CAD s/p CABG x1 and Hx of AVR, HTN, HLD, Hypothyroidism, Anemia, Depression presents  10/09/17 with worsened Knee pain and a fall. Dx of septic TKA. s/p resection and antibiotic spacer   OT comments  Assisted to [facility] then back to bed.  Multimodal cues at times for directionality.    Follow Up Recommendations  SNF;Supervision/Assistance - 24 hour    Equipment Recommendations  3 in 1 bedside commode    Recommendations for Other Services      Precautions / Restrictions Precautions Precautions: Knee;Fall Required Braces or Orthoses: Knee Immobilizer - Right(AAT unless clarified) Restrictions RLE Weight Bearing: Partial weight bearing RLE Partial Weight Bearing Percentage or Pounds: 50%       Mobility Bed Mobility           Sit to supine: Mod assist;+2 for physical assistance;+2 for safety/equipment      Transfers   Equipment used: Rolling walker (2 wheeled)   Sit to Stand: Mod assist;+2 physical assistance;+2 safety/equipment Stand pivot transfers: Mod assist;+2 physical assistance;+2 safety/equipment       General transfer comment: assist to rise and stabilize    Balance                                           ADL either performed or assessed with clinical judgement   ADL                           Toilet Transfer: Moderate assistance;+2 for physical assistance;+2 for safety/equipment;BSC;Stand-pivot   Toileting- Clothing Manipulation and Hygiene: Total assistance;Sit to/from stand;+2 for physical assistance;+2 for safety/equipment               Vision       Perception     Praxis      Cognition Arousal/Alertness: Awake/alert Behavior During Therapy: [facility] for tasks assessed/performed                                   General Comments: tearful and indecisive today.  Multimodal cues at times for safety        Exercises     Shoulder Instructions       General Comments      Pertinent Vitals/ Pain       Pain Assessment: Faces Faces Pain Scale: Hurts even more Pain Location: right knee Pain Descriptors / Indicators: Aching Pain Intervention(s): Limited activity within patient's tolerance;Monitored during session;Premedicated before session;Repositioned  Home Living                                          Prior Functioning/Environment              Frequency  Min 2X/week        Progress Toward Goals  OT Goals(current goals can now be found in the care plan section)  Progress towards OT goals: Progressing toward goals     Plan      Co-evaluation    PT/OT/SLP Co-Evaluation/Treatment: Yes Reason for Co-Treatment: To address  functional/ADL transfers PT goals addressed during session: Mobility/safety with mobility OT goals addressed during session: ADL's and self-care      AM-PAC PT "6 Clicks" Daily Activity     Outcome Measure   Help from another person eating meals?: None Help from another person taking care of personal grooming?: A Little Help from another person toileting, which includes using toliet, bedpan, or urinal?: A Lot Help from another person bathing (including washing, rinsing, drying)?: A Lot Help from another person to put on and taking off regular upper body clothing?: A Lot Help from another person to put on and taking off regular lower body clothing?: Total 6 Click Score: 14    End of Session    OT Visit Diagnosis: Muscle weakness (generalized) (M62.81);Pain Pain - Right/Left: Right Pain - part of body: Knee   Activity Tolerance Patient limited by fatigue   Patient Left in bed;with call bell/phone within reach;with bed alarm set   Nurse Communication          Time: 0981-1914 OT Time Calculation (min):  28 min  Charges: OT General Charges $OT Visit: 1 Visit OT Treatments $Self Care/Home Management : 8-22 mins  Paula Ferguson, OTR/L [phone] 10/17/2017   Paula Ferguson 10/17/2017, 4:01 PM

## 2017-10-17 NOTE — Progress Notes (Signed)
PT Cancellation Note  Patient Details Name: Paula Ferguson MRN: [id number] DOB: [birth year]   Cancelled Treatment:    Reason Eval/Treat Not Completed: Medical issues which prohibited therapy, to get blood, check back another time.   [name] 10/17/2017, 9:10 AM  [name] PT [phone]

## 2017-10-17 NOTE — Progress Notes (Signed)
Peripherally Inserted Central Catheter/Midline Placement  The IV Nurse has discussed with the patient and/or persons authorized to consent for the patient, the purpose of this procedure and the potential benefits and risks involved with this procedure.  The benefits include less needle sticks, lab draws from the catheter, and the patient may be discharged home with the catheter. Risks include, but not limited to, infection, bleeding, blood clot (thrombus formation), and puncture of an artery; nerve damage and irregular heartbeat and possibility to perform a PICC exchange if needed/ordered by physician.  Alternatives to this procedure were also discussed.  Bard Power PICC patient education guide, fact sheet on infection prevention and patient information card has been provided to patient /or left at bedside.    PICC/Midline Placement Documentation  PICC Single Lumen 28/24/17 PICC Right Basilic 38 cm 0 cm (Active)  Indication for Insertion or Continuance of Line Home intravenous therapies (PICC only) 10/17/2017  8:23 PM  Exposed Catheter (cm) 0 cm 10/17/2017  8:23 PM  Site Assessment Clean;Dry;Intact 10/17/2017  8:23 PM  Line Status Blood return noted;Flushed;Saline locked 10/17/2017  8:23 PM  Dressing Type Transparent;Securing device 10/17/2017  8:23 PM  Dressing Status Clean;Dry;Intact;Antimicrobial disc in place 10/17/2017  8:23 PM  Line Adjustment (NICU/IV Team Only) No 10/17/2017  8:23 PM  Dressing Intervention New dressing 10/17/2017  8:23 PM  Dressing Change Due 10/24/17 10/17/2017  8:23 PM       Edson Snowball 10/17/2017, 8:42 PM

## 2017-10-17 NOTE — Consult Note (Addendum)
Referring Provider: [facility]   Primary Care Physician:  Marletta Lor, MD Primary Gastroenterologist:  Previously Verl Blalock,  MD  Reason for Consultation:    Elevated bilirubin, cirrhosis by ultrasound   ASSESSMENT AND PLAN:    46. 77 yo female admitted with acute on chronic knee pain / infected prosthetic knee joint. She is s/p hardware removal and placement of antibiotic spacer this admission.   2. Hyperbilirubinemia, predominantly direct bili. No abdominal pain. Remainder of liver chemistries unremarkable though she has had mild transaminitis on a few occasions over the years. U/S suggests cirrhosis. Hyperbilirubinemia likely related to antibiotic / rifampin and ? Infection. Doubt obstructive process -She could have compensated cirrhosis. Etiology of which is unknown at this point. Will check check a few labs for autoimmune disease / viral hepatitis serologies -Will also obtain an MRCP given the mildly enlarged pancreatic duct on u/s though doubtful obstructive process.  -I suspect the remainder of any GI workup can be done as outpatient.   HPI: Paula Ferguson is a 77 y.o. female with multiple medical problems not limited to CAD / CABG, hx of AVR of coumadin, HTN, and hypothyroidism. She has chronic knee pain but admitted on 8/20 with escalation of right knee pain / swelling. Aspiration >> staph infection. Patient has been receiving rifampin and Ancef. Yesterday she underwent excisional right total knee arthroplasty with antibiotic spacers.   Patient bilirubin has been rising since admission. Direct bili predominates slightly over indirect. Tbili has decreased slightly overnight. She denies any known history of liver disease in herself of family. At home she has not had any abdominal discomfort, unintentional weight loss. She actually has no chronic GI complaints.    RUQ u/s 10/16/17:  IMPRESSION: Common bile duct 9.4 mm, upper normal for post  cholecystectomy  Nodular liver contour consistent with cirrhosis.  Mild ascites  Small right pleural effusion  Mild prominence pancreatic duct measuring 3.5 mm distally   Past Medical History:  Diagnosis Date  . ANEMIA 09/15/2009  . Blood transfusion 2000's; 11/17/2011   S/P "one of my other shoulder surgeries"; S/P left shoulder replacement  . CORONARY ARTERY DISEASE 08/26/2007   LOV Dr Tamala Julian 1/13 with EKG and clearance on chart  . Depression   . Heart murmur    hx- surgery  . HYPERLIPIDEMIA 02/07/2007  . HYPERTENSION 09/14/2006  . HYPOTHYROIDISM 09/14/2006  . LIVER FUNCTION TESTS, ABNORMAL, HX OF 01/12/2009  . OLECRANON BURSITIS, LEFT 08/26/2007  . OSTEOARTHRITIS 02/07/2007   "thumbs; knees; shoulders; ?back"  . Pneumonia    hx  . PTSD (post-traumatic stress disorder) 1976    Past Surgical History:  Procedure Laterality Date  . ABDOMINAL HYSTERECTOMY  1981  . APPENDECTOMY  1981  . CARDIAC CATHETERIZATION  2003  . CARDIAC VALVE REPLACEMENT  2003   "aortic"  . CARPAL TUNNEL RELEASE  1990's   bilaterally  . CHOLECYSTECTOMY  1990's  . COLONOSCOPY    . CORONARY ARTERY BYPASS GRAFT  2003   CABG X1 "mammary" w/aortic valve replacement   . DILATION AND CURETTAGE OF UTERUS  1981?  Marland Kitchen EXCISIONAL TOTAL KNEE ARTHROPLASTY WITH ANTIBIOTIC SPACERS N/A 10/16/2017   Procedure: RIGHT TOTAL KNEE RESECTION WITH ANTIBIOTIC SPACER PLACEMENT;  Surgeon: Paralee Cancel, MD;  Location: [facility];  Service: Orthopedics;  Laterality: N/A;  . EXPLORATORY LAPAROTOMY  ~ 1981  . FRACTURE SURGERY    . JOINT REPLACEMENT     right shoulder arthroscopy, left partial knee replacement  . KNEE ARTHROSCOPY  1980's; 2012   left; right  . PARTIAL KNEE ARTHROPLASTY  1990's   left  . REVERSE SHOULDER ARTHROPLASTY  11/17/2011   Procedure: REVERSE SHOULDER ARTHROPLASTY;  Surgeon: Augustin Schooling, MD;  Location: [place];  Service: Orthopedics;  Laterality: Left;  left reverse shoulder arthroplasty  . SHOULDER  ARTHROSCOPY  2000's   right; "3 surgeries";  Marland Kitchen SHOULDER ARTHROSCOPY W/ ROTATOR CUFF REPAIR  1990's  . STERIOD INJECTION  11/17/2011   Procedure: STEROID INJECTION;  Surgeon: Augustin Schooling, MD;  Location: [place];  Service: Orthopedics;  Laterality: Right;  cortizone injection right thumb  . TEE WITHOUT CARDIOVERSION N/A 10/12/2017   Procedure: TRANSESOPHAGEAL ECHOCARDIOGRAM (TEE);  Surgeon: Josue Hector, MD;  Location: [place];  Service: Cardiovascular;  Laterality: N/A;  . TONSILLECTOMY AND ADENOIDECTOMY  ~ 1964  . TOTAL KNEE ARTHROPLASTY  03/21/2011   Procedure: TOTAL KNEE ARTHROPLASTY;  Surgeon: Mauri Pole, MD;  Location: [facility];  Service: Orthopedics;  Laterality: Right;  . TOTAL SHOULDER REPLACEMENT  2012; 11/17/2011   right; left  . TRANSFER / TRANSPLANT ANKLE TENDON SUPERFICIAL / DEEP  1990's   "bunch of surgeries after dr ruptured posterior tibial tendon", right foot  . v43.3     heart valve    Prior to Admission medications   Medication Sig Start Date End Date Taking? Authorizing Provider  amoxicillin (AMOXIL) 500 MG capsule TAKE 4 TABLETS BY MOUTH 1/2 HOUR PRIOR TO DENTAL APPOINTMENT. Patient taking differently: Take 2,000 mg by mouth as needed (For dental procedures.).  09/01/16  Yes Marletta Lor, MD  atorvastatin (LIPITOR) 20 MG tablet Take 1 tablet (20 mg total) by mouth daily. 07/04/17  Yes Marletta Lor, MD  BIOTIN PO Take 1 tablet by mouth daily.   Yes [provider]  buPROPion (WELLBUTRIN SR) 150 MG 12 hr tablet TAKE 1 TABLET BY MOUTH DAILY. Patient taking differently: Take 150 mg by mouth daily.  01/19/17  Yes Marletta Lor, MD  diclofenac sodium (VOLTAREN) 1 % GEL Apply 2 g topically as needed (For pain.).    Yes [provider]  ferrous sulfate 325 (65 FE) MG tablet Take 325 mg by mouth daily.    Yes [provider]  HYDROcodone-acetaminophen (NORCO) 7.5-325 MG tablet Take 1 tablet by mouth every 6 (six) hours as  needed for moderate pain. 09/05/17  Yes Marletta Lor, MD  levothyroxine (SYNTHROID, LEVOTHROID) 100 MCG tablet TAKE 1 TABLET BY MOUTH DAILY. Patient taking differently: Take 100 mcg by mouth daily before breakfast.  05/29/17  Yes Marletta Lor, MD  losartan (COZAAR) 100 MG tablet TAKE 1 TABLET BY MOUTH DAILY. Patient taking differently: Take 100 mg by mouth daily.  12/11/16  Yes Marletta Lor, MD  Menthol, Topical Analgesic, (BIOFREEZE ROLL-ON EX) Apply 1 application topically as needed (Applies to knees for pain.).   Yes [provider]  methocarbamol (ROBAXIN) 500 MG tablet Take 1 tablet (500 mg total) by mouth 3 (three) times daily as needed. Patient taking differently: Take 500 mg by mouth 3 (three) times daily as needed for muscle spasms.  09/05/17  Yes Marletta Lor, MD  traZODone (DESYREL) 100 MG tablet TAKE 1 TABLET BY MOUTH AT BEDTIME AS NEEDED FOR SLEEP. Patient taking differently: Take 50-100 mg by mouth at bedtime as needed for sleep.  06/04/17  Yes Marletta Lor, MD  verapamil (CALAN-SR) 240 MG CR tablet TAKE 1/2 TABLET BY MOUTH AT BEDTIME. Patient taking differently: Take 120  mg by mouth at bedtime.  06/04/17  Yes Marletta Lor, MD  warfarin (COUMADIN) 5 MG tablet TAKE 1 TABLET BY MOUTH AS DIRECTED. Patient taking differently: Take 2.5-5 mg by mouth every evening. Take one tablet on Sunday, Tuesday, Thursday and half a tablet the rest of the week. 12/20/16  Yes Marletta Lor, MD    Current Facility-Administered Medications  Medication Dose Route Frequency Provider Last Rate Last Dose  . 0.9 %  sodium chloride infusion   Intravenous PRN Danae Orleans, PA-C 0 mL/hr at 10/13/17 2150    . 0.9 %  sodium chloride infusion   Intravenous Continuous Danae Orleans, PA-C 75 mL/hr at 10/17/17 0960    . acetaminophen (TYLENOL) tablet 650 mg  650 mg Oral Q6H PRN Danae Orleans, PA-C   650 mg at 10/13/17 1117   Or  . acetaminophen  (TYLENOL) suppository 650 mg  650 mg Rectal Q6H PRN Danae Orleans, PA-C      . alum & mag hydroxide-simeth (MAALOX/MYLANTA) 200-200-20 MG/5ML suspension 15 mL  15 mL Oral Q4H PRN Babish, Matthew, PA-C      . bisacodyl (DULCOLAX) suppository 10 mg  10 mg Rectal Daily PRN Danae Orleans, PA-C   10 mg at 10/15/17 1245  . buPROPion [facility] SR) 12 hr tablet 150 mg  150 mg Oral Daily Danae Orleans, PA-C   150 mg at 10/16/17 4540  . ceFAZolin (ANCEF) IVPB 2g/100 mL premix  2 g Intravenous Q8H Danae Orleans, PA-C 75 mL/hr at 10/17/17 0522 2 g at 10/17/17 0522  . celecoxib (CELEBREX) capsule 200 mg  200 mg Oral BID Danae Orleans, PA-C   200 mg at 10/16/17 2225  . diclofenac sodium (VOLTAREN) 1 % transdermal gel 2 g  2 g Topical PRN Danae Orleans, PA-C   2 g at 10/14/17 1457  . diphenhydrAMINE (BENADRYL) 12.5 MG/5ML elixir 12.5-25 mg  12.5-25 mg Oral Q4H PRN Danae Orleans, PA-C      . docusate sodium (COLACE) capsule 100 mg  100 mg Oral BID Danae Orleans, PA-C   100 mg at 10/16/17 2223  . enoxaparin (LOVENOX) injection 40 mg  40 mg Subcutaneous Q24H Danae Orleans, PA-C   40 mg at 10/17/17 0841  . fentaNYL (SUBLIMAZE) injection 50 mcg  50 mcg Intravenous Q1H PRN Danae Orleans, PA-C   50 mcg at 10/15/17 0810  . ferrous sulfate tablet 325 mg  325 mg Oral Daily Danae Orleans, PA-C   325 mg at 10/16/17 9811  . hydrALAZINE (APRESOLINE) injection 10 mg  10 mg Intravenous Q6H PRN Danae Orleans, PA-C   10 mg at 10/14/17 1453  . HYDROcodone-acetaminophen (NORCO) 7.5-325 MG per tablet 1-2 tablet  1-2 tablet Oral Q4H PRN Danae Orleans, PA-C   2 tablet at 10/16/17 1951  . HYDROcodone-acetaminophen (NORCO/VICODIN) 5-325 MG per tablet 1-2 tablet  1-2 tablet Oral Q4H PRN Danae Orleans, PA-C      . HYDROmorphone (DILAUDID) injection 0.25-0.5 mg  0.25-0.5 mg Intravenous Q5 min PRN Suzette Battiest, MD   0.5 mg at 10/16/17 2226  . levothyroxine (SYNTHROID, LEVOTHROID) tablet 100 mcg  100 mcg Oral  QAC breakfast Danae Orleans, PA-C   100 mcg at 10/17/17 0840  . lidocaine (XYLOCAINE) 0.5 % (with pres) injection 10 mL  10 mL Intradermal Once Babish, Matthew, PA-C      . losartan (COZAAR) tablet 100 mg  100 mg Oral Daily Danae Orleans, PA-C   100 mg at 10/16/17 0911  . magnesium citrate solution 1 Bottle  1  Bottle Oral Once PRN Danae Orleans, PA-C      . MEDLINE mouth rinse  15 mL Mouth Rinse BID Paralee Cancel, MD   15 mL at 10/16/17 2235  . menthol-cetylpyridinium (CEPACOL) lozenge 3 mg  1 lozenge Oral PRN Danae Orleans, PA-C       Or  . phenol (CHLORASEPTIC) mouth spray 1 spray  1 spray Mouth/Throat PRN Babish, Matthew, PA-C      . methocarbamol (ROBAXIN) tablet 500 mg  500 mg Oral TID PRN Danae Orleans, PA-C   500 mg at 10/16/17 1736  . metoCLOPramide (REGLAN) tablet 5-10 mg  5-10 mg Oral Q8H PRN Danae Orleans, PA-C       Or  . metoCLOPramide (REGLAN) injection 5-10 mg  5-10 mg Intravenous Q8H PRN Danae Orleans, PA-C      . morphine 2 MG/ML injection 0.5-1 mg  0.5-1 mg Intravenous Q2H PRN Babish, Matthew, PA-C      . ondansetron [facility]) tablet 4 mg  4 mg Oral Q6H PRN Danae Orleans, PA-C       Or  . ondansetron [facility]) injection 4 mg  4 mg Intravenous Q6H PRN Danae Orleans, PA-C   4 mg at 10/16/17 2000  . polyethylene glycol (MIRALAX / GLYCOLAX) packet 17 g  17 g Oral BID Babish, Matthew, PA-C      . potassium chloride SA (K-DUR,KLOR-CON) CR tablet 40 mEq  40 mEq Oral Once Babish, Matthew, PA-C      . promethazine (PHENERGAN) injection 6.25-12.5 mg  6.25-12.5 mg Intravenous Q15 min PRN Suzette Battiest, MD      . traZODone (DESYREL) tablet 100 mg  100 mg Oral QHS PRN Danae Orleans, PA-C   100 mg at 10/15/17 2236  . verapamil (CALAN-SR) CR tablet 120 mg  120 mg Oral QHS Danae Orleans, PA-C   120 mg at 10/16/17 2223  . Warfarin - Pharmacist Dosing Inpatient   Does not apply q1800 Eudelia Bunch, [facility]        Allergies as of 10/09/2017 - Review Complete 10/09/2017   Allergen Reaction Noted  . Hydromorphone hcl Itching 04/26/2006  . Meperidine hcl Itching 04/26/2006  . Morphine sulfate Itching 04/26/2006  . Percocet [oxycodone-acetaminophen] Itching 10/09/2017    Family History  Problem Relation Age of Onset  . Lung disease Mother   . Lung disease Father   . Healthy Sister   . Kidney disease Brother     Social History   Socioeconomic History  . Marital status: Widowed    Spouse name: Not on file  . Number of children: Not on file  . Years of education: Not on file  . Highest education level: Not on file  Occupational History  . Not on file  Social Needs  . Financial resource strain: Not hard at all  . Food insecurity:    Worry: Never true    Inability: Never true  . Transportation needs:    Medical: No    Non-medical: No  Tobacco Use  . Smoking status: Never Smoker  . Smokeless tobacco: Never Used  Substance and Sexual Activity  . Alcohol use: No  . Drug use: No  . Sexual activity: Not Currently  Lifestyle  . Physical activity:    Days per week: 0 days    Minutes per session: 0 min  . Stress: Not at all  Relationships  . Social connections:    Talks on phone: More than three times a week    Gets together: More than three times  a week    Attends religious service: More than 4 times per year    Active member of club or organization: Yes    Attends meetings of clubs or organizations: More than 4 times per year    Relationship status: Widowed  . Intimate partner violence:    Fear of current or ex partner: Not on file    Emotionally abused: Not on file    Physically abused: Not on file    Forced sexual activity: Not on file  Other Topics Concern  . Not on file  Social History Narrative  . Not on file    Review of Systems: All systems reviewed and negative except where noted in HPI.  Physical Exam: Vital signs in last 24 hours: Temp:  [97.5 F (36.4 C)-99.3 F (37.4 C)] 97.8 F (36.6 C) (08/28 0939) Pulse Rate:   [84-99] 91 (08/28 0939) Resp:  [14-21] 15 (08/28 0253) BP: (101-144)/(51-75) 112/51 (08/28 0939) SpO2:  [92 %-100 %] 100 % (08/28 0939) Weight:  [71.7 kg] 71.7 kg (08/28 0500) Last BM Date: 10/15/17 General:   Alert, well-developed, white female in NAD Psych:  Pleasant, cooperative. Normal mood and affect. Eyes:  Pupils equal, + icterus.   Conjunctiva pink. Ears:  Normal auditory acuity. Nose:  No deformity, discharge,  or lesions. Neck:  Supple; no masses Lungs:  Clear throughout to auscultation.   No wheezes, crackles, or rhonchi.  Heart:  Regular rate and rhythm;systolic murmur, no edema Abdomen:  Soft, non-distended, nontender, BS active, no palp mass    Rectal:  Deferred  Msk:  Symmetrical without gross deformities. . Neurologic:  Alert and  oriented x4;  grossly normal neurologically. Skin:  Intact without significant lesions or rashes..   Intake/Output from previous day: 08/27 0701 - 08/28 0700 In: 4546.3 [P.O.:1300; I.V.:2721.9; IV Piggyback:399.3] Out: 650 [Urine:625; Blood:25] Intake/Output this shift: No intake/output data recorded.  Lab Results: Recent Labs    10/15/17 0409 10/16/17 0517 10/17/17 0453  WBC 24.2* 25.9* 36.1*  HGB 8.5* 8.3* 7.2*  HCT 24.7* 24.0* 21.7*  PLT 177 188 195   BMET Recent Labs    10/15/17 0409 10/16/17 0517 10/17/17 0453  NA 137 134* 135  K 3.6 3.4* 4.2  CL 110 108 110  CO2 21* 20* 21*  GLUCOSE 63* 69* 100*  BUN 29* 32* 39*  CREATININE 0.67 0.75 0.98  CALCIUM 7.6* 7.7* 7.4*   LFT Recent Labs    10/17/17 0453  PROT 4.5*  4.5*  ALBUMIN 1.4*  1.5*  AST 30  27  ALT 5  5  ALKPHOS 92  86  BILITOT 5.6*  5.8*  BILIDIR 3.5*  IBILI 2.1*   PT/INR Recent Labs    10/16/17 0722 10/17/17 0453  LABPROT 17.5* 18.9*  INR 1.44 1.60   Tye Savoy, NP-C @  10/17/2017, 10:13 AM    Attending physician's note   I have taken an interval history, reviewed the chart and examined the patient. I agree with the Advanced  Practitioner's note, impression, and recommendations as outlined. Agree that the elevated TBili with an o/w normal ALP is more likely med induced (Rifampin) vs cholestasis of systemic illness/infection rather than obstructive process. However, in light of dilated PD agree with MRCP. O/w to eval for cirrhotic contours noted on RUQ [place] as above.    [address] DO, FACG [phone] office

## 2017-10-17 NOTE — Progress Notes (Signed)
OT Cancellation Note  Patient Details Name: Paula Ferguson MRN: [id number] DOB: April 08, 1940   Cancelled Treatment:    Reason Eval/Treat Not Completed: Other (comment). Pt is getting blood. Will check back later today if schedule permits, or in the morning, if not  Shamyia Grandpre 10/17/2017, 10:02 AM  Lesle Chris, OTR/L [phone] 10/17/2017

## 2017-10-17 NOTE — Evaluation (Signed)
Physical Therapy RE-Evaluation Patient Details Name: Paula Ferguson MRN: [id number] DOB: [birth year] Today's Date: 10/17/2017   History of Present Illness  77 y.o. female with medical history significant of CAD s/p CABG x1 and Hx of AVR, HTN, HLD, Hypothyroidism, Anemia, Depression presents  10/09/17 with worsened Knee pain and a fall. Dx of septic TKA. s/p resection and antibiotic spacer  Clinical Impression  The patient requires multimodal cues to stay on task of mobilizing to [facility] then to bed. No family present to discuss DC plan and caregiver availability. IF Dc to home, recommend hospital bed as patient hs bed and bath  Second level and and WC for safe mobility. Pt admitted with above diagnosis. Pt currently with functional limitations due to the deficits listed below (see PT Problem List).  Pt will benefit from skilled PT to increase their independence and safety with mobility to allow discharge to the venue listed below.       Follow Up Recommendations SNF;vs Home health PT-family not present.    Equipment Recommendations  Wheelchair (measurements PT);Wheelchair cushion (measurements PT)    Recommendations for Other Services       Precautions / Restrictions Precautions Precautions: Knee;Fall Precaution Comments: KI At all times unless clarified Required Braces or Orthoses: Knee Immobilizer - Right Restrictions RLE Weight Bearing: Partial weight bearing RLE Partial Weight Bearing Percentage or Pounds: 50%      Mobility  Bed Mobility   Bed Mobility: Supine to Sit;Sit to Supine       Sit to supine: Mod assist;+2 for physical assistance;+2 for safety/equipment      Transfers   Equipment used: Rolling walker (2 wheeled)   Sit to Stand: Mod assist;+2 physical assistance;+2 safety/equipment Stand pivot transfers: Mod assist;+2 physical assistance;+2 safety/equipment       General transfer comment: assist to rise and stabilize, multimodal cues to transfer to [facility] then  to bed. , assist to move the  right leg  Ambulation/Gait                Stairs            Wheelchair Mobility    Modified Rankin (Stroke Patients Only)       Balance                                             Pertinent Vitals/Pain Pain Assessment: Faces Faces Pain Scale: Hurts even more Pain Location: right knee Pain Descriptors / Indicators: Aching Pain Intervention(s): Monitored during session;Premedicated before session;Repositioned    Home Living Family/patient expects to be discharged to:: Private residence Living Arrangements: Children Available Help at Discharge: Family   Home Access: Stairs to enter Entrance Stairs-Rails: Left Entrance Stairs-Number of Steps: 4 Home Layout: Two level;Bed/bath upstairs Home Equipment: Walker - 2 wheels Additional Comments: has been staying on first level, sleeping on couch    Prior Function Level of Independence: Independent               Hand Dominance        Extremity/Trunk Assessment   Upper Extremity Assessment Upper Extremity Assessment: Defer to OT evaluation LUE Deficits / Details: limited shoulder movement; h/o RTSA, moderate edema left arm where blood is infusing    Lower Extremity Assessment Lower Extremity Assessment: RLE deficits/detail RLE Deficits / Details: KI in place, able to bear a little weight    Cervical /  Trunk Assessment Cervical / Trunk Assessment: Kyphotic(bruising rib cage on right)  Communication   Communication: No difficulties  Cognition Arousal/Alertness: Awake/alert Behavior During Therapy: WFL for tasks assessed/performed Overall Cognitive Status: Within Functional Limits for tasks assessed                                 General Comments: tearful and indecisive today      General Comments      Exercises     Assessment/Plan    PT Assessment    PT Problem List Decreased strength;Decreased activity tolerance;Decreased  balance;Decreased mobility;Decreased knowledge of precautions;Decreased safety awareness;Pain;Decreased knowledge of use of DME       PT Treatment Interventions DME instruction;Gait training;Functional mobility training;Therapeutic activities;Therapeutic exercise;Patient/family education    PT Goals (Current goals can be found in the Care Plan section)  Acute Rehab PT Goals Patient Stated Goal: to go home PT Goal Formulation: With patient Time For Goal Achievement: 10/31/17 Potential to Achieve Goals: Fair    Frequency Min 5X/week   Barriers to discharge        Co-evaluation PT/OT/SLP Co-Evaluation/Treatment: Yes Reason for Co-Treatment: For patient/therapist safety PT goals addressed during session: Mobility/safety with mobility OT goals addressed during session: ADL's and self-care       AM-PAC PT "6 Clicks" Daily Activity  Outcome Measure Difficulty turning over in bed (including adjusting bedclothes, sheets and blankets)?: Unable Difficulty moving from lying on back to sitting on the side of the bed? : Unable Difficulty sitting down on and standing up from a chair with arms (e.g., wheelchair, bedside commode, etc,.)?: Unable Help needed moving to and from a bed to chair (including a wheelchair)?: Total Help needed walking in hospital room?: Total Help needed climbing 3-5 steps with a railing? : Total 6 Click Score: 6    End of Session Equipment Utilized During Treatment: Gait belt Activity Tolerance: Patient limited by pain Patient left: in bed;with call bell/phone within reach;with bed alarm set;with nursing/sitter in room Nurse Communication: Mobility status PT Visit Diagnosis: Unsteadiness on feet (R26.81);History of falling (Z91.81)    Time: 5885-0277 PT Time Calculation (min) (ACUTE ONLY): 29 min   Charges:   PT Evaluation $PT Re-evaluation: 1 Re-eval          [name] PT [phone]   [name] 10/17/2017, 4:35 PM

## 2017-10-17 NOTE — Progress Notes (Signed)
Patient ID: Paula Ferguson, female   DOB: [birth year], 77 y.o.   MRN: [id number]  PROGRESS NOTE    Paula Ferguson  LOV:[id number] DOB: 17-Mar-1940 DOA: 10/09/2017 PCP: Marletta Lor, MD   Brief Narrative:  77 year old female with history of CAD status post CABG and history of AVR, hypertension, hyperlipidemia, hypothyroidism, anemia, depression and other comorbidities presented with worsened knee pain after a fall. She was admitted with right severe knee pain along with mild rhabdomyolysis.  She was found to have MSSA bacteremia.  ID and orthopedics were consulted.  TEE was negative for vegetations.  She underwent Right total knee resection with antibiotic spacer placement on 10/16/2017 by orthopedics.  GI was consulted for elevated bilirubin.  Assessment & Plan:   Active Problems:   Hypothyroidism   Hyperlipidemia   ANEMIA   Essential hypertension   Coronary atherosclerosis   S/P right knee replacement   Anticoagulation goal of INR 2.5 to 3.5   History of mechanical aortic valve replacement   Chronic pain   Knee pain, acute   Fall   Leukocytosis   Rhabdomyolysis   Dizziness   Lightheadedness   Generalized weakness   S/P total knee arthroplasty   Elevated bilirubin   Other ascites   Pancreatic duct dilated    MSSA bacteremia with leukocytosis probably from prosthetic right knee joint infection -ID following: Initially started on Ancef and rifampin but rifampin was discontinued on 10/16/2017 by ID.  Continue Ancef.  -Repeat blood cultures on 10/11/2017 show no growth to date -status post TEE, no vegetations noted -leukocytosis worsening: Will monitor WBCs -Currently not spiking temperatures  Prosthetic right knee joint infection -Orthopedics following: Status post right total knee resection and antibiotic spacer placement on 10/16/2017.  Follow further orthopedics recommendations.  Wound care as per orthopedics recommendations -PT/OT eval.  Fall  precautions  Acute rhabdomyolysis -Resolved.  Coronary artery disease with history of CABG and AVR -Currently chest pain-free -Continue losartan, verapamil -Coumadin will be restarted.  Statin held because of mild rhabdomyolysis.    Supratherapeutic INR -Resolved.  Currently INR is subtherapeutic.  Will restart Coumadin  Essential hypertension -Continue losartan, verapamil.  Blood pressure controlled  Hyperlipidemia -Statin held due to rhabdomyolysis   Depression -Continue bupropion  Acute blood loss anemia -Probably secondary to perioperative blood loss.  Improving 7.2 today.  Patient is being transfused packed red cells as per orthopedics recommendations.  Check hemoglobin in a.m.  Hypothyroidism -Continue Synthroid  Hypokalemia -Resolved with replacement   Elevated bilirubin -?rifampin -GI consult appreciated.  Ultrasound shows probable cirrhosis.  MRCP has been ordered by GI.  Trend LFTs  DVT prophylaxis: Coumadin.  Will discontinue Lovenox Code Status: Full Family Communication: None at bedside Disposition Plan: Depends on clinical outcome  Consultants: Orthopedic/ID/GI  Procedures:Right total knee resection with antibiotic spacer placement on 10/16/2017 by orthopedics  Antimicrobials: Ancef from 10/10/2017 onwards Rifampin 10/11/2017-10/17/2017  Subjective: Patient seen and examined at bedside.  She denies any overnight fever, nausea, vomiting.  Her right knee pain is controlled with medications.  Objective: Vitals:   10/17/17 0500 10/17/17 0540 10/17/17 0939 10/17/17 1115  BP:  116/60 (!) 112/51 (!) 127/59  Pulse:  84 91 95  Resp:    20  Temp:  98.5 F (36.9 C) 97.8 F (36.6 C) 97.7 F (36.5 C)  TempSrc:  Oral Oral Oral  SpO2:  98% 100% 96%  Weight: 71.7 kg     Height:        Intake/Output Summary (Last 24 hours)  at 10/17/2017 1132 Last data filed at 10/17/2017 0700 Gross per 24 hour  Intake 4446.25 ml  Output 625 ml  Net 3821.25 ml    Filed Weights   10/15/17 0639 10/16/17 0609 10/17/17 0500  Weight: 64.6 kg 65.4 kg 71.7 kg    Examination:  General exam: Appears calm and comfortable  Respiratory system: Bilateral decreased breath sounds at bases Cardiovascular system: S1 & S2 heard, Rate controlled Gastrointestinal system: Abdomen is nondistended, soft and nontender. Normal bowel sounds heard. Extremities: No cyanosis, clubbing; right knee dressing present    Data Reviewed: I have personally reviewed following labs and imaging studies  CBC: Recent Labs  Lab 10/13/17 0514 10/14/17 0451 10/15/17 0409 10/16/17 0517 10/17/17 0453  WBC 14.2* 18.8* 24.2* 25.9* 36.1*  NEUTROABS  --   --  20.1*  --  32.7  HGB 9.8* 8.9* 8.5* 8.3* 7.2*  HCT 29.0* 25.9* 24.7* 24.0* 21.7*  MCV 94.2 94.5 93.2 93.4 97.3  PLT 143* 144* 177 188 546   Basic Metabolic Panel: Recent Labs  Lab 10/13/17 0514 10/13/17 1311 10/14/17 0451 10/15/17 0409 10/16/17 0517 10/17/17 0453  NA 139  --  140 137 134* 135  K 3.1*  --  3.8 3.6 3.4* 4.2  CL 111  --  114* 110 108 110  CO2 23  --  23 21* 20* 21*  GLUCOSE 83  --  84 63* 69* 100*  BUN 27*  --  29* 29* 32* 39*  CREATININE 0.73  --  0.78 0.67 0.75 0.98  CALCIUM 8.1*  --  7.8* 7.6* 7.7* 7.4*  MG  --  1.8  --   --   --   --    GFR: Estimated Creatinine Clearance: 45.3 mL/min (by C-G formula based on SCr of 0.98 mg/dL). Liver Function Tests: Recent Labs  Lab 10/15/17 0409 10/15/17 1350 10/16/17 0517 10/17/17 0453  AST 28  --  27 30  27   ALT 6  --  6 5  5   ALKPHOS 79  --  79 92  86  BILITOT 4.1* 5.0* 6.0* 5.6*  5.8*  PROT 5.0*  --  4.8* 4.5*  4.5*  ALBUMIN 1.7*  --  1.7* 1.4*  1.5*   No results for input(s): LIPASE, AMYLASE in the last 168 hours. No results for input(s): AMMONIA in the last 168 hours. Coagulation Profile: Recent Labs  Lab 10/15/17 0409 10/16/17 0722 10/17/17 0453  INR 3.34 1.44 1.60   Cardiac Enzymes: Recent Labs  Lab 10/11/17 1432  10/13/17 1311  CKTOTAL 269* 70   BNP (last 3 results) No results for input(s): PROBNP in the last 8760 hours. HbA1C: No results for input(s): HGBA1C in the last 72 hours. CBG: Recent Labs  Lab 10/15/17 0735 10/15/17 0853 10/16/17 0748 10/16/17 1031 10/17/17 0729  GLUCAP 66* 89 61* 83 85   Lipid Profile: No results for input(s): CHOL, HDL, LDLCALC, TRIG, CHOLHDL, LDLDIRECT in the last 72 hours. Thyroid Function Tests: No results for input(s): TSH, T4TOTAL, FREET4, T3FREE, THYROIDAB in the last 72 hours. Anemia Panel: No results for input(s): VITAMINB12, FOLATE, FERRITIN, TIBC, IRON, RETICCTPCT in the last 72 hours. Sepsis Labs: No results for input(s): PROCALCITON, LATICACIDVEN in the last 168 hours.  Recent Results (from the past 240 hour(s))  Culture, blood (routine x 2)     Status: Abnormal   Collection Time: 10/09/17 12:29 PM  Result Value Ref Range Status   Specimen Description   Final    BLOOD RIGHT ANTECUBITAL Performed  at [facility], [place] [address], [place], [place] [address]    Special Requests   Final    BOTTLES DRAWN AEROBIC AND ANAEROBIC Blood Culture results may not be optimal due to an inadequate volume of blood received in culture bottles Performed at [facility] [address] [place], [place] [address]    Culture  Setup Time   Final    GRAM POSITIVE COCCI IN BOTH AEROBIC AND ANAEROBIC BOTTLES CRITICAL VALUE NOTED.  VALUE IS CONSISTENT WITH PREVIOUSLY REPORTED AND CALLED VALUE.    Culture (A)  Final    STAPHYLOCOCCUS AUREUS SUSCEPTIBILITIES PERFORMED ON PREVIOUS CULTURE WITHIN THE LAST 5 DAYS. Performed at [facility], [place] [address] [place], [place] [address]    Report Status 10/12/2017 FINAL  Final  Culture, blood (routine x 2)     Status: Abnormal   Collection Time: 10/09/17 12:34 PM  Result Value Ref Range Status   Specimen Description   Final    BLOOD LEFT ANTECUBITAL Performed at [facility] [address], [place], [place] [address]    Special Requests   Final    BOTTLES DRAWN AEROBIC AND ANAEROBIC Blood Culture adequate volume Performed at [facility] [address] [place], [place] [address]    Culture  Setup Time   Final    GRAM POSITIVE COCCI IN CLUSTERS IN BOTH AEROBIC AND ANAEROBIC BOTTLES CRITICAL RESULT CALLED TO, READ BACK BY AND VERIFIED WITH: Karel Jarvis PharmD 9:50 10/10/17 ([email or web address]) Performed at [facility], [place] [address] [place], [place] [address]    Culture STAPHYLOCOCCUS AUREUS (A)  Final   Report Status 10/12/2017 FINAL  Final   Organism ID, Bacteria STAPHYLOCOCCUS AUREUS  Final      Susceptibility   Staphylococcus aureus - MIC*    CIPROFLOXACIN <=0.5 SENSITIVE Sensitive     ERYTHROMYCIN >=8 RESISTANT Resistant     GENTAMICIN <=0.5 SENSITIVE Sensitive     OXACILLIN 0.5 SENSITIVE Sensitive     TETRACYCLINE <=1 SENSITIVE Sensitive     VANCOMYCIN <=0.5 SENSITIVE Sensitive     TRIMETH/SULFA <=10 SENSITIVE Sensitive     CLINDAMYCIN <=0.25 SENSITIVE Sensitive     RIFAMPIN <=0.5 SENSITIVE Sensitive     Inducible Clindamycin NEGATIVE Sensitive     * STAPHYLOCOCCUS AUREUS  Blood Culture ID Panel (Reflexed)     Status: Abnormal   Collection Time: 10/09/17 12:34 PM  Result Value Ref Range Status   Enterococcus species NOT DETECTED NOT DETECTED Final   Listeria monocytogenes NOT DETECTED NOT DETECTED Final   Staphylococcus species DETECTED (A) NOT DETECTED Final    Comment: CRITICAL RESULT CALLED TO, READ BACK BY AND VERIFIED WITH: Karel Jarvis PharmD 9:50 10/10/17 ([email or web address])    Staphylococcus aureus DETECTED (A) NOT DETECTED Final    Comment: Methicillin (oxacillin) susceptible Staphylococcus aureus (MSSA). Preferred therapy is anti staphylococcal beta lactam antibiotic (Cefazolin or Nafcillin), unless clinically contraindicated. CRITICAL RESULT CALLED TO, READ BACK BY AND VERIFIED WITH: Karel Jarvis PharmD 9:50 10/10/17  ([email or web address])    Methicillin resistance NOT DETECTED NOT DETECTED Final   Streptococcus species NOT DETECTED NOT DETECTED Final   Streptococcus agalactiae NOT DETECTED NOT DETECTED Final   Streptococcus pneumoniae NOT DETECTED NOT DETECTED Final   Streptococcus pyogenes NOT DETECTED NOT DETECTED Final   Acinetobacter baumannii NOT DETECTED NOT DETECTED Final   Enterobacteriaceae species NOT DETECTED NOT DETECTED Final   Enterobacter cloacae complex NOT DETECTED NOT DETECTED Final   Escherichia  coli NOT DETECTED NOT DETECTED Final   Klebsiella oxytoca NOT DETECTED NOT DETECTED Final   Klebsiella pneumoniae NOT DETECTED NOT DETECTED Final   Proteus species NOT DETECTED NOT DETECTED Final   Serratia marcescens NOT DETECTED NOT DETECTED Final   Haemophilus influenzae NOT DETECTED NOT DETECTED Final   Neisseria meningitidis NOT DETECTED NOT DETECTED Final   Pseudomonas aeruginosa NOT DETECTED NOT DETECTED Final   Candida albicans NOT DETECTED NOT DETECTED Final   Candida glabrata NOT DETECTED NOT DETECTED Final   Candida krusei NOT DETECTED NOT DETECTED Final   Candida parapsilosis NOT DETECTED NOT DETECTED Final   Candida tropicalis NOT DETECTED NOT DETECTED Final    Comment: Performed at [facility], [place] [address], [place], [place] [address]  Urine culture     Status: Abnormal   Collection Time: 10/10/17  2:18 AM  Result Value Ref Range Status   Specimen Description   Final    URINE, RANDOM Performed at [facility] [address] [place], [place] [address]    Special Requests   Final    NONE Performed at [facility], [place] [address], [place], [place] [address]    Culture MULTIPLE SPECIES PRESENT, SUGGEST RECOLLECTION (A)  Final   Report Status 10/12/2017 FINAL  Final  Aerobic/Anaerobic Culture (surgical/deep wound)     Status: None   Collection Time: 10/10/17  5:45 PM  Result Value Ref Range Status   Specimen Description   Final     SYNOVIAL Performed at [facility] [address], [place], [place] [address]    Special Requests   Final    NONE Performed at [facility], [place] [address], [place], [place] [address]    Gram Stain   Final    ABUNDANT WBC PRESENT, PREDOMINANTLY PMN ABUNDANT GRAM POSITIVE COCCI IN PAIRS IN CHAINS IN CLUSTERS Gram Stain Report Called to,Read Back By and Verified With: S.WRIGHT AT 1929 ON 10/10/17 BY N.THOMPSON Performed at [facility], [place] [address] [place], [place] [address]    Culture   Final    MODERATE STAPHYLOCOCCUS AUREUS NO ANAEROBES ISOLATED Performed at [facility], [place] [address], [place], [place] [address]    Report Status 10/15/2017 FINAL  Final   Organism ID, Bacteria STAPHYLOCOCCUS AUREUS  Final      Susceptibility   Staphylococcus aureus - MIC*    CIPROFLOXACIN <=0.5 SENSITIVE Sensitive     ERYTHROMYCIN >=8 RESISTANT Resistant     GENTAMICIN <=0.5 SENSITIVE Sensitive     OXACILLIN 0.5 SENSITIVE Sensitive     TETRACYCLINE <=1 SENSITIVE Sensitive     VANCOMYCIN 1 SENSITIVE Sensitive     TRIMETH/SULFA <=10 SENSITIVE Sensitive     CLINDAMYCIN <=0.25 SENSITIVE Sensitive     RIFAMPIN <=0.5 SENSITIVE Sensitive     Inducible Clindamycin NEGATIVE Sensitive     * MODERATE STAPHYLOCOCCUS AUREUS  Culture, blood (routine x 2)     Status: None   Collection Time: 10/11/17  1:09 PM  Result Value Ref Range Status   Specimen Description   Final    BLOOD RIGHT ANTECUBITAL Performed at [facility] [address], [place], [place] [address]    Special Requests   Final    BOTTLES DRAWN AEROBIC AND ANAEROBIC Blood Culture adequate volume Performed at [facility] [address], [place], [place] [address]    Culture   Final    NO GROWTH 5 DAYS  Performed at [facility], [place] [address] [place], [place] [address]    Report Status 10/16/2017 FINAL  Final  Culture,  blood (routine x 2)     Status: None   Collection Time: 10/11/17  1:13 PM  Result Value Ref Range Status   Specimen Description   Final    BLOOD BLOOD RIGHT FOREARM Performed at [facility] [address], [place], [place] [address]    Special Requests   Final    BOTTLES DRAWN AEROBIC AND ANAEROBIC Blood Culture adequate volume Performed at [facility] [address] [place], [place] [address]    Culture   Final    NO GROWTH 5 DAYS Performed at [facility], [place] [address], [place],  [address]    Report Status 10/16/2017 FINAL  Final         Radiology Studies: [place] Ekg Site Rite  Result Date: 10/16/2017 If Site Rite image not attached, placement could not be confirmed due to current cardiac rhythm.  US Abdomen Limited Ruq  Result Date: 10/16/2017 CLINICAL DATA:  Elevated bilirubin EXAM: ULTRASOUND ABDOMEN LIMITED RIGHT UPPER QUADRANT COMPARISON:  None. FINDINGS: Gallbladder: Surgically absent Common bile duct: Diameter: 9.4 mm, upper normal for post cholecystectomy Liver: Nodular liver contour consistent with cirrhosis. No focal liver lesion. Mild ascites around the liver. Small right pleural effusion. Portal vein is patent on color Doppler imaging with normal direction of blood flow towards the liver. Limited imaging of the pancreas feels no mass lesion mild prominence of the pancreatic duct measuring 3.5 mm. IMPRESSION: Common bile duct 9.4 mm, upper normal for post cholecystectomy Nodular liver contour consistent with cirrhosis.  Mild ascites Small right pleural effusion Mild prominence pancreatic duct measuring 3.5 mm distally. Electronically Signed   By: Franchot Gallo M.D.   On: 10/16/2017 09:54        Scheduled Meds: . buPROPion  150 mg Oral Daily  . celecoxib  200 mg Oral BID  . docusate sodium  100 mg Oral BID  . enoxaparin (LOVENOX) injection  40 mg Subcutaneous Q24H  . ferrous sulfate  325 mg Oral Daily  .  levothyroxine  100 mcg Oral QAC breakfast  . lidocaine  10 mL Intradermal Once  . losartan  100 mg Oral Daily  . mouth rinse  15 mL Mouth Rinse BID  . polyethylene glycol  17 g Oral BID  . potassium chloride  40 mEq Oral Once  . verapamil  120 mg Oral QHS  . Warfarin - Pharmacist Dosing Inpatient   Does not apply q1800   Continuous Infusions: . sodium chloride 0 mL/hr at 10/13/17 2150  . sodium chloride 75 mL/hr at 10/17/17 0523  .  ceFAZolin (ANCEF) IV 2 g (10/17/17 0522)     LOS: 7 days        Aline August, MD [facility] Pager [phone]  If 7PM-7AM, please contact night-coverage [email or web address] Password [facility] 10/17/2017, 11:32 AM

## 2017-10-17 NOTE — Progress Notes (Signed)
ANTICOAGULATION CONSULT NOTE  Pharmacy Consult for warfarin Indication: Aortic mechanical valve   Allergies  Allergen Reactions  . Hydromorphone Hcl Itching    "Dilaudid"  . Meperidine Hcl Itching  . Morphine Sulfate Itching  . Percocet [Oxycodone-Acetaminophen] Itching    Patient Measurements: Height: 5\' 2"  (157.5 cm) Weight: 158 lb (71.7 kg) IBW/kg (Calculated) : 50.1  Vital Signs: Temp: 97.7 F (36.5 C) (08/28 1115) Temp Source: Oral (08/28 1115) BP: 127/59 (08/28 1115) Pulse Rate: 95 (08/28 1115)  Labs: Recent Labs    10/15/17 0409 10/16/17 0517 10/16/17 0722 10/17/17 0453  HGB 8.5* 8.3*  --  7.2*  HCT 24.7* 24.0*  --  21.7*  PLT 177 188  --  195  LABPROT 33.6*  --  17.5* 18.9*  INR 3.34  --  1.44 1.60  HEPARINUNFRC 0.47  --   --   --   CREATININE 0.67 0.75  --  0.98   Estimated Creatinine Clearance: 45.3 mL/min (by C-G formula based on SCr of 0.98 mg/dL).  Assessment: 77 yo female with PMH of mechanical aortic vale replacement on chronic warfarin anticoagulation.  Admitted on 8/20 s/p fall with rhabdo, and with PMH of prosthetic TKR; found to have MSSA bacteremia and prosthetic joint infection. Patient on heparin perioperatively, now transitioning back to warfarin 8/27.   Baseline INR subtherapeutic  Prior anticoagulation: warfarin 2.5 mg daily except 5 mg on Mon & Fri, LD 8/20  Significant events:  Heparin gtt 8/21 - 8/26, avoiding warfarin d/t rifampin interxn  8/26: Vit K 1 mg IV   8/27: TKA with spacer placement; GI workup requested for hyperbilirubinemia; rifampin stopped as patient now with spacer and could be worsening tbili, resume warfarin  Today, 10/17/2017:  CBC: Hgb dropping postop, 2 units ordered, Plt stable wnl  INR subtherapeutic but rising appropriately after   Major drug interactions: none major, on celecoxib and ppx LMWH while INR low  No bleeding issues per nursing  Eating 75% of meals  Goal of Therapy: INR  2.5-3.5  Plan:  Repeat warfarin 5 mg PO tonight at 18:00; patient seems highly sensitive to warfarin postop despite vitamin K; will need to be more conservative once INR >2.0  Enoxaparin 40 mg SQ daily per Ortho  Daily INR  CBC at least q72 hr while on warfarin  Monitor for signs of bleeding or thrombosis   Reuel Boom, PharmD, BCPS [phone] 10/17/2017, 11:50 AM

## 2017-10-18 DIAGNOSIS — T796XXA Traumatic ischemia of muscle, initial encounter: Secondary | ICD-10-CM

## 2017-10-18 DIAGNOSIS — E8809 Other disorders of plasma-protein metabolism, not elsewhere classified: Secondary | ICD-10-CM

## 2017-10-18 DIAGNOSIS — R7881 Bacteremia: Secondary | ICD-10-CM

## 2017-10-18 DIAGNOSIS — T8453XA Infection and inflammatory reaction due to internal right knee prosthesis, initial encounter: Secondary | ICD-10-CM

## 2017-10-18 DIAGNOSIS — B9561 Methicillin susceptible Staphylococcus aureus infection as the cause of diseases classified elsewhere: Secondary | ICD-10-CM | POA: Clinically undetermined

## 2017-10-18 LAB — BPAM RBC
BLOOD PRODUCT EXPIRATION DATE: 201909122359
Blood Product Expiration Date: 201909132359
ISSUE DATE / TIME: 201908281128
ISSUE DATE / TIME: 201908281521
UNIT TYPE AND RH: 5100
Unit Type and Rh: 5100

## 2017-10-18 LAB — COMPREHENSIVE METABOLIC PANEL
ALT: <5 U/L (ref 0–44)
AST: 30 U/L (ref 15–41)
Albumin: 1.6 g/dL — ABNORMAL LOW (ref 3.5–5.0)
Alkaline Phosphatase: 104 U/L (ref 38–126)
Anion gap: 4 — ABNORMAL LOW (ref 5–15)
BILIRUBIN TOTAL: 3.9 mg/dL — AB (ref 0.3–1.2)
BUN: 45 mg/dL — AB (ref 8–23)
CALCIUM: 7.7 mg/dL — AB (ref 8.9–10.3)
CO2: 21 mmol/L — AB (ref 22–32)
Chloride: 109 mmol/L (ref 98–111)
Creatinine, Ser: 1 mg/dL (ref 0.44–1.00)
GFR, EST NON AFRICAN AMERICAN: 53 mL/min — AB (ref >60)
GLUCOSE: 78 mg/dL (ref 70–99)
Potassium: 4.1 mmol/L (ref 3.5–5.1)
Sodium: 134 mmol/L — ABNORMAL LOW (ref 135–145)
TOTAL PROTEIN: 5.1 g/dL — AB (ref 6.5–8.1)

## 2017-10-18 LAB — TYPE AND SCREEN
ABO/RH(D): O POS
Antibody Screen: NEGATIVE
Unit division: 0
Unit division: 0

## 2017-10-18 LAB — CBC
HCT: 28.3 % — ABNORMAL LOW (ref 36.0–46.0)
Hemoglobin: 10 g/dL — ABNORMAL LOW (ref 12.0–15.0)
MCH: 31.3 pg (ref 26.0–34.0)
MCHC: 35.3 g/dL (ref 30.0–36.0)
MCV: 88.7 fL (ref 78.0–100.0)
PLATELETS: 217 10*3/uL (ref 150–400)
RBC: 3.19 MIL/uL — ABNORMAL LOW (ref 3.87–5.11)
RDW: 19.9 % — AB (ref 11.5–15.5)
WBC: 34 10*3/uL — ABNORMAL HIGH (ref 4.0–10.5)

## 2017-10-18 LAB — C-REACTIVE PROTEIN: CRP: 13.7 mg/dL — ABNORMAL HIGH (ref <1.0)

## 2017-10-18 LAB — PROTIME-INR
INR: 2.02
Prothrombin Time: 22.7 seconds — ABNORMAL HIGH (ref 11.4–15.2)

## 2017-10-18 LAB — GLUCOSE, CAPILLARY
GLUCOSE-CAPILLARY: 109 mg/dL — AB (ref 70–99)
Glucose-Capillary: 77 mg/dL (ref 70–99)

## 2017-10-18 LAB — MAGNESIUM: Magnesium: 2.1 mg/dL (ref 1.7–2.4)

## 2017-10-18 MED ORDER — HYDROXYZINE HCL 10 MG PO TABS
10.0000 mg | ORAL_TABLET | Freq: Every evening | ORAL | Status: DC | PRN
  Filled 2017-10-18: qty 1

## 2017-10-18 MED ORDER — WARFARIN SODIUM 2.5 MG PO TABS
2.5000 mg | ORAL_TABLET | Freq: Once | ORAL | Status: AC
  Administered 2017-10-18: 2.5 mg via ORAL
  Filled 2017-10-18: qty 1

## 2017-10-18 NOTE — Progress Notes (Signed)
Called to room by PT. Therapy had walked with the patient approx 5-6 feet and the patient said she felt very winded, they sat her in the recliner and checked pulse ox. Her HR was reading in the 140s on the Dinamap. When I entered the room it read 137, I checked the patients radial pulse, 112, irregular. Patient asymptomatic. Will continue to monitor closely.

## 2017-10-18 NOTE — Progress Notes (Signed)
Patient ID: Paula Ferguson, female   DOB: 06/05/1940, 77 y.o.   MRN: [id number]  PROGRESS NOTE    TOVAH SLAVICK  RDE:[id number] DOB: January 09, 1941 DOA: 10/09/2017 PCP: Marletta Lor, MD   Brief Narrative:  77 year old female with history of CAD status post CABG and history of AVR, hypertension, hyperlipidemia, hypothyroidism, anemia, depression and other comorbidities presented with worsened knee pain after a fall. She was admitted with right severe knee pain along with mild rhabdomyolysis.  She was found to have MSSA bacteremia.  ID and orthopedics were consulted.  TEE was negative for vegetations.  She underwent Right total knee resection with antibiotic spacer placement on 10/16/2017 by orthopedics.  GI was consulted for elevated bilirubin.  Assessment & Plan:   Active Problems:   Hypothyroidism   Hyperlipidemia   ANEMIA   Essential hypertension   Coronary atherosclerosis   S/P right knee replacement   Anticoagulation goal of INR 2.5 to 3.5   History of mechanical aortic valve replacement   Chronic pain   Knee pain, acute   Fall   Leukocytosis   Rhabdomyolysis   Dizziness   Lightheadedness   Generalized weakness   S/P total knee arthroplasty   Elevated bilirubin   Other ascites   Pancreatic duct dilated    MSSA bacteremia with leukocytosis probably from prosthetic right knee joint infection -ID following: Initially started on Ancef and rifampin but rifampin was discontinued on 10/16/2017 by ID.  Continue Ancef.  -Repeat blood cultures on 10/11/2017 show no growth to date -status post TEE, no vegetations noted -leukocytosis improving: Will monitor WBCs -Currently not spiking temperatures  Prosthetic right knee joint infection -Orthopedics following: Status post right total knee resection and antibiotic spacer placement on 10/16/2017.  Follow further orthopedics recommendations.  Wound care as per orthopedics recommendations -PT/OT eval.  Fall  precautions  Acute rhabdomyolysis -Resolved.  Coronary artery disease with history of CABG and AVR -Currently chest pain-free -Continue losartan, verapamil -Continue Coumadin.  Continue Lovenox till INR is therapeutic.  Statin held because of mild rhabdomyolysis.    Supratherapeutic INR -Resolved.  Currently INR is subtherapeutic.  Continue Coumadin for INR goal to be between 2.5-3.5  Essential hypertension -Continue losartan, verapamil.  Blood pressure controlled  Hyperlipidemia -Statin held due to rhabdomyolysis   Depression -Continue bupropion  Acute blood loss anemia -Probably secondary to perioperative blood loss.  Status post 2 unit packed red cells transfusion on 10/17/2017.  Hemoglobin 10 today.  Monitor CBC.  Hypothyroidism -Continue Synthroid  Hypokalemia -Resolved with replacement   Elevated bilirubin -?rifampin -GI consult appreciated.  Ultrasound shows probable cirrhosis.  MRCP ordered by GI is pending.  Bilirubin improving.  Generalized deconditioning -PT/OT recommends SNF.  Social worker consult  DVT prophylaxis: Coumadin/lovenox Code Status: Full Family Communication: None at bedside Disposition Plan: Depends on clinical outcome  Consultants: Orthopedic/ID/GI  Procedures:Right total knee resection with antibiotic spacer placement on 10/16/2017 by orthopedics  Antimicrobials: Ancef from 10/10/2017 onwards Rifampin 10/11/2017-10/17/2017  Subjective: Patient seen and examined at bedside.  She denies any overnight fever, nausea, vomiting, worsening knee pain, shortness of breath or chest pain. Objective: Vitals:   10/17/17 1543 10/17/17 1746 10/17/17 2105 10/18/17 0516  BP: 129/65 137/77 (!) 144/72 (!) 110/58  Pulse: 89 86 84 91  Resp: 15 16 16    Temp: 98.4 F (36.9 C) 98.5 F (36.9 C) 98.3 F (36.8 C) 98 F (36.7 C)  TempSrc: Oral Oral Oral Oral  SpO2: 98% 98% 100% 98%  Weight:  Height:        Intake/Output Summary (Last 24  hours) at 10/18/2017 0942 Last data filed at 10/18/2017 0701 Gross per 24 hour  Intake 1657.95 ml  Output 200 ml  Net 1457.95 ml   Filed Weights   10/15/17 0639 10/16/17 0609 10/17/17 0500  Weight: 64.6 kg 65.4 kg 71.7 kg    Examination:  General exam: Appears calm and comfortable, no distress Respiratory system: Bilateral decreased breath sounds at bases, no wheezing Cardiovascular system: S1 & S2 heard, Rate controlled Gastrointestinal system: Abdomen is nondistended, soft and nontender. Normal bowel sounds heard. Extremities: No cyanosis; right knee dressing present    Data Reviewed: I have personally reviewed following labs and imaging studies  CBC: Recent Labs  Lab 10/14/17 0451 10/15/17 0409 10/16/17 0517 10/17/17 0453 10/18/17 0435  WBC 18.8* 24.2* 25.9* 36.1* 34.0*  NEUTROABS  --  20.1*  --  32.7  --   HGB 8.9* 8.5* 8.3* 7.2* 10.0*  HCT 25.9* 24.7* 24.0* 21.7* 28.3*  MCV 94.5 93.2 93.4 97.3 88.7  PLT 144* 177 188 195 947   Basic Metabolic Panel: Recent Labs  Lab 10/13/17 1311 10/14/17 0451 10/15/17 0409 10/16/17 0517 10/17/17 0453 10/18/17 0435  NA  --  140 137 134* 135 134*  K  --  3.8 3.6 3.4* 4.2 4.1  CL  --  114* 110 108 110 109  CO2  --  23 21* 20* 21* 21*  GLUCOSE  --  84 63* 69* 100* 78  BUN  --  29* 29* 32* 39* 45*  CREATININE  --  0.78 0.67 0.75 0.98 1.00  CALCIUM  --  7.8* 7.6* 7.7* 7.4* 7.7*  MG 1.8  --   --   --   --  2.1   GFR: Estimated Creatinine Clearance: 44.4 mL/min (by C-G formula based on SCr of 1 mg/dL). Liver Function Tests: Recent Labs  Lab 10/15/17 0409 10/15/17 1350 10/16/17 0517 10/17/17 0453 10/18/17 0435  AST 28  --  27 30  27 30   ALT 6  --  6 5  5  <5  ALKPHOS 79  --  79 92  86 104  BILITOT 4.1* 5.0* 6.0* 5.6*  5.8* 3.9*  PROT 5.0*  --  4.8* 4.5*  4.5* 5.1*  ALBUMIN 1.7*  --  1.7* 1.4*  1.5* 1.6*   No results for input(s): LIPASE, AMYLASE in the last 168 hours. No results for input(s): AMMONIA in the  last 168 hours. Coagulation Profile: Recent Labs  Lab 10/15/17 0409 10/16/17 0722 10/17/17 0453 10/18/17 0435  INR 3.34 1.44 1.60 2.02   Cardiac Enzymes: Recent Labs  Lab 10/11/17 1432 10/13/17 1311  CKTOTAL 269* 70   BNP (last 3 results) No results for input(s): PROBNP in the last 8760 hours. HbA1C: No results for input(s): HGBA1C in the last 72 hours. CBG: Recent Labs  Lab 10/15/17 0853 10/16/17 0748 10/16/17 1031 10/17/17 0729 10/18/17 0747  GLUCAP 89 61* 83 85 77   Lipid Profile: No results for input(s): CHOL, HDL, LDLCALC, TRIG, CHOLHDL, LDLDIRECT in the last 72 hours. Thyroid Function Tests: No results for input(s): TSH, T4TOTAL, FREET4, T3FREE, THYROIDAB in the last 72 hours. Anemia Panel: No results for input(s): VITAMINB12, FOLATE, FERRITIN, TIBC, IRON, RETICCTPCT in the last 72 hours. Sepsis Labs: No results for input(s): PROCALCITON, LATICACIDVEN in the last 168 hours.  Recent Results (from the past 240 hour(s))  Culture, blood (routine x 2)     Status: Abnormal   Collection  Time: 10/09/17 12:29 PM  Result Value Ref Range Status   Specimen Description   Final    BLOOD RIGHT ANTECUBITAL Performed at [facility] [address], [place], [place] [address]    Special Requests   Final    BOTTLES DRAWN AEROBIC AND ANAEROBIC Blood Culture results may not be optimal due to an inadequate volume of blood received in culture bottles Performed at [facility] [address] [place], [place] [address]    Culture  Setup Time   Final    GRAM POSITIVE COCCI IN BOTH AEROBIC AND ANAEROBIC BOTTLES CRITICAL VALUE NOTED.  VALUE IS CONSISTENT WITH PREVIOUSLY REPORTED AND CALLED VALUE.    Culture (A)  Final    STAPHYLOCOCCUS AUREUS SUSCEPTIBILITIES PERFORMED ON PREVIOUS CULTURE WITHIN THE LAST 5 DAYS. Performed at [facility], [place] [address], [place], [place] [address]    Report Status 10/12/2017 FINAL  Final   Culture, blood (routine x 2)     Status: Abnormal   Collection Time: 10/09/17 12:34 PM  Result Value Ref Range Status   Specimen Description   Final    BLOOD LEFT ANTECUBITAL Performed at [facility] [address], [place], [place] [address]    Special Requests   Final    BOTTLES DRAWN AEROBIC AND ANAEROBIC Blood Culture adequate volume Performed at [facility] [address], [place], [place] [address]    Culture  Setup Time   Final    GRAM POSITIVE COCCI IN CLUSTERS IN BOTH AEROBIC AND ANAEROBIC BOTTLES CRITICAL RESULT CALLED TO, READ BACK BY AND VERIFIED WITH: Karel Jarvis PharmD 9:50 10/10/17 ([email or web address]) Performed at [facility], [place] [address], [place], [place] [address]    Culture STAPHYLOCOCCUS AUREUS (A)  Final   Report Status 10/12/2017 FINAL  Final   Organism ID, Bacteria STAPHYLOCOCCUS AUREUS  Final      Susceptibility   Staphylococcus aureus - MIC*    CIPROFLOXACIN <=0.5 SENSITIVE Sensitive     ERYTHROMYCIN >=8 RESISTANT Resistant     GENTAMICIN <=0.5 SENSITIVE Sensitive     OXACILLIN 0.5 SENSITIVE Sensitive     TETRACYCLINE <=1 SENSITIVE Sensitive     VANCOMYCIN <=0.5 SENSITIVE Sensitive     TRIMETH/SULFA <=10 SENSITIVE Sensitive     CLINDAMYCIN <=0.25 SENSITIVE Sensitive     RIFAMPIN <=0.5 SENSITIVE Sensitive     Inducible Clindamycin NEGATIVE Sensitive     * STAPHYLOCOCCUS AUREUS  Blood Culture ID Panel (Reflexed)     Status: Abnormal   Collection Time: 10/09/17 12:34 PM  Result Value Ref Range Status   Enterococcus species NOT DETECTED NOT DETECTED Final   Listeria monocytogenes NOT DETECTED NOT DETECTED Final   Staphylococcus species DETECTED (A) NOT DETECTED Final    Comment: CRITICAL RESULT CALLED TO, READ BACK BY AND VERIFIED WITH: Karel Jarvis PharmD 9:50 10/10/17 ([email or web address])    Staphylococcus aureus DETECTED (A) NOT DETECTED Final    Comment: Methicillin (oxacillin) susceptible Staphylococcus aureus (MSSA).  Preferred therapy is anti staphylococcal beta lactam antibiotic (Cefazolin or Nafcillin), unless clinically contraindicated. CRITICAL RESULT CALLED TO, READ BACK BY AND VERIFIED WITH: Karel Jarvis PharmD 9:50 10/10/17 ([email or web address])    Methicillin resistance NOT DETECTED NOT DETECTED Final   Streptococcus species NOT DETECTED NOT DETECTED Final   Streptococcus agalactiae NOT DETECTED NOT DETECTED Final   Streptococcus pneumoniae NOT DETECTED NOT DETECTED Final   Streptococcus pyogenes NOT DETECTED NOT DETECTED Final   Acinetobacter baumannii NOT DETECTED NOT  DETECTED Final   Enterobacteriaceae species NOT DETECTED NOT DETECTED Final   Enterobacter cloacae complex NOT DETECTED NOT DETECTED Final   Escherichia coli NOT DETECTED NOT DETECTED Final   Klebsiella oxytoca NOT DETECTED NOT DETECTED Final   Klebsiella pneumoniae NOT DETECTED NOT DETECTED Final   Proteus species NOT DETECTED NOT DETECTED Final   Serratia marcescens NOT DETECTED NOT DETECTED Final   Haemophilus influenzae NOT DETECTED NOT DETECTED Final   Neisseria meningitidis NOT DETECTED NOT DETECTED Final   Pseudomonas aeruginosa NOT DETECTED NOT DETECTED Final   Candida albicans NOT DETECTED NOT DETECTED Final   Candida glabrata NOT DETECTED NOT DETECTED Final   Candida krusei NOT DETECTED NOT DETECTED Final   Candida parapsilosis NOT DETECTED NOT DETECTED Final   Candida tropicalis NOT DETECTED NOT DETECTED Final    Comment: Performed at [facility], [place] [address] [place], [place] [address]  Urine culture     Status: Abnormal   Collection Time: 10/10/17  2:18 AM  Result Value Ref Range Status   Specimen Description   Final    URINE, RANDOM Performed at [facility] [address] [place], [place] [address]    Special Requests   Final    NONE Performed at [facility], [place] [address] [place], [place] [address]    Culture MULTIPLE SPECIES PRESENT, SUGGEST RECOLLECTION (A)  Final    Report Status 10/12/2017 FINAL  Final  Aerobic/Anaerobic Culture (surgical/deep wound)     Status: None   Collection Time: 10/10/17  5:45 PM  Result Value Ref Range Status   Specimen Description   Final    SYNOVIAL Performed at [facility] [address] [place], [place] [address]    Special Requests   Final    NONE Performed at [facility], [place] [address] [place] [address] [place], [place] [address]    Gram Stain   Final    ABUNDANT WBC PRESENT, PREDOMINANTLY PMN ABUNDANT GRAM POSITIVE COCCI IN PAIRS IN CHAINS IN CLUSTERS Gram Stain Report Called to,Read Back By and Verified With: S.WRIGHT AT 1929 ON 10/10/17 BY N.THOMPSON Performed at [facility], [place] [address], [place], [place] [address]    Culture   Final    MODERATE STAPHYLOCOCCUS AUREUS NO ANAEROBES ISOLATED Performed at [facility], [place] [address], [place], [place] [address]    Report Status 10/15/2017 FINAL  Final   Organism ID, Bacteria STAPHYLOCOCCUS AUREUS  Final      Susceptibility   Staphylococcus aureus - MIC*    CIPROFLOXACIN <=0.5 SENSITIVE Sensitive     ERYTHROMYCIN >=8 RESISTANT Resistant     GENTAMICIN <=0.5 SENSITIVE Sensitive     OXACILLIN 0.5 SENSITIVE Sensitive     TETRACYCLINE <=1 SENSITIVE Sensitive     VANCOMYCIN 1 SENSITIVE Sensitive     TRIMETH/SULFA <=10 SENSITIVE Sensitive     CLINDAMYCIN <=0.25 SENSITIVE Sensitive     RIFAMPIN <=0.5 SENSITIVE Sensitive     Inducible Clindamycin NEGATIVE Sensitive     * MODERATE STAPHYLOCOCCUS AUREUS  Culture, blood (routine x 2)     Status: None   Collection Time: 10/11/17  1:09 PM  Result Value Ref Range Status   Specimen Description   Final    BLOOD RIGHT ANTECUBITAL Performed at [facility] [address], [place],  [address]    Special Requests   Final    BOTTLES DRAWN AEROBIC AND ANAEROBIC Blood Culture adequate volume Performed at [facility]  [place] [address], [place], [place] [address]    Culture   Final    NO GROWTH 5 DAYS Performed at [facility], [place] [address] [place], [place] [address]    Report Status 10/16/2017 FINAL  Final  Culture, blood (routine x 2)     Status: None   Collection Time: 10/11/17  1:13 PM  Result Value Ref Range Status   Specimen Description   Final    BLOOD BLOOD RIGHT FOREARM Performed at [facility] [address], [place], [place] [address]    Special Requests   Final    BOTTLES DRAWN AEROBIC AND ANAEROBIC Blood Culture adequate volume Performed at [facility] [address] [place], [place] [address]    Culture   Final    NO GROWTH 5 DAYS Performed at [facility], [place] [address] [place], [place] [address]    Report Status 10/16/2017 FINAL  Final         Radiology Studies: No results found.      Scheduled Meds: . buPROPion  150 mg Oral Daily  . celecoxib  200 mg Oral BID  . docusate sodium  100 mg Oral BID  . enoxaparin (LOVENOX) injection  40 mg Subcutaneous Q24H  . ferrous sulfate  325 mg Oral Daily  . levothyroxine  100 mcg Oral QAC breakfast  . lidocaine  10 mL Intradermal Once  . losartan  100 mg Oral Daily  . mouth rinse  15 mL Mouth Rinse BID  . polyethylene glycol  17 g Oral BID  . potassium chloride  40 mEq Oral Once  . verapamil  120 mg Oral QHS  . Warfarin - Pharmacist Dosing Inpatient   Does not apply q1800   Continuous Infusions: . sodium chloride 0 mL/hr at 10/13/17 2150  .  ceFAZolin (ANCEF) IV 2 g (10/18/17 0701)     LOS: 8 days        Aline August, MD [facility] Pager [phone]  If 7PM-7AM, please contact night-coverage [email or web address] Password [facility] 10/18/2017, 9:42 AM

## 2017-10-18 NOTE — Plan of Care (Signed)
Plan of care discussed with patient 

## 2017-10-18 NOTE — Progress Notes (Signed)
Physical Therapy Treatment Patient Details Name: Paula Ferguson MRN: [id number] DOB: [birth year] Today's Date: 10/18/2017    History of Present Illness 77 y.o. female with medical history significant of CAD s/p CABG x1 and Hx of AVR, HTN, HLD, Hypothyroidism, Anemia, Depression presents  10/09/17 with worsened Knee pain and a fall. Dx of septic TKA. s/p resection and antibiotic spacer    PT Comments    Patient ambulated x 6' with much difficulty. Noted SOB4/4, HR 147. RN notified. Noted edema of bioth legs with left weeping. Patient is very weak and deconditioned and left knee is weak. Requires KI.  Spoke with a daughter  Who reports that  A WC may not be accessible in patient's home. The patient also sleeps on a couch as BR is upstairs. Patient may benefit from SNF  If family unable to provide level of care required at this time.   Follow Up Recommendations  SNF;vsHome health PT     Equipment Recommendations  Wheelchair (measurements PT);Wheelchair cushion (measurements PT)    Recommendations for Other Services       Precautions / Restrictions Precautions Precautions: Knee;Fall Precaution Comments: KI At all times unless clarified, HR up 147 Required Braces or Orthoses: Knee Immobilizer - Right Knee Immobilizer - Right: On at all times Restrictions RLE Weight Bearing: Partial weight bearing RLE Partial Weight Bearing Percentage or Pounds: 50%    Mobility  Bed Mobility               General bed mobility comments: in chair  Transfers Overall transfer level: Needs assistance Equipment used: Rolling walker (2 wheeled) Transfers: Sit to/from Stand Sit to Stand: Mod assist;+2 physical assistance;+2 safety/equipment         General transfer comment: assist to rise and stabilize, multimodal cues for hand placement  Ambulation/Gait Ambulation/Gait assistance: Mod assist;+2 physical assistance;+2 safety/equipment Gait Distance (Feet): 6 Feet Assistive device: Rolling  walker (2 wheeled) Gait Pattern/deviations: Step-to pattern;Antalgic;Decreased step length - right;Decreased stance time - right     General Gait Details: patient requires steady assist  for balance, multimodal cues. Patient with increased RR and gasping so chair brought up. HR 147., sats 96. RN notified   Stairs             Wheelchair Mobility    Modified Rankin (Stroke Patients Only)       Balance                                            Cognition Arousal/Alertness: Awake/alert                                     General Comments: patient is more  lucid today and following commands better, not distracted      Exercises      General Comments        Pertinent Vitals/Pain Pain Assessment: Faces Faces Pain Scale: Hurts little more Pain Location: right knee Pain Descriptors / Indicators: Discomfort;Grimacing;Guarding Pain Intervention(s): Limited activity within patient's tolerance;Monitored during session;Premedicated before session;Repositioned    Home Living                      Prior Function            PT Goals (current goals can now be found  in the care plan section) Progress towards PT goals: Not progressing toward goals - comment(very weak)    Frequency    Min 6X/week      PT Plan Frequency needs to be updated    Co-evaluation              AM-PAC PT "6 Clicks" Daily Activity  Outcome Measure  Difficulty turning over in bed (including adjusting bedclothes, sheets and blankets)?: Unable Difficulty moving from lying on back to sitting on the side of the bed? : Unable Difficulty sitting down on and standing up from a chair with arms (e.g., wheelchair, bedside commode, etc,.)?: Unable Help needed moving to and from a bed to chair (including a wheelchair)?: Total Help needed walking in hospital room?: Total Help needed climbing 3-5 steps with a railing? : Total 6 Click Score: 6    End of  Session Equipment Utilized During Treatment: Gait belt;Right knee immobilizer Activity Tolerance: Treatment limited secondary to medical complications (Comment)(SOB)     PT Visit Diagnosis: Unsteadiness on feet (R26.81);History of falling (Z91.81)     Time: 1201-1229 PT Time Calculation (min) (ACUTE ONLY): 28 min  Charges:  $Gait Training: 23-37 mins                     [facility] PT [phone]    [name] 10/18/2017, 5:50 PM

## 2017-10-18 NOTE — Progress Notes (Addendum)
[facility]    Date of Admission:  10/09/2017   Total days of antibiotics 9 cefazolin          ID: Paula Ferguson is a 77 y.o. female with MSSA bacteemia with right knee PJI s/p 2 staged revision with arthroplasty resection and abtx spacer in place. She had hyperbilirubinemia presumably from rifampin, now stopped Active Problems:   Hypothyroidism   Hyperlipidemia   ANEMIA   Essential hypertension   Coronary atherosclerosis   S/P right knee replacement   Anticoagulation goal of INR 2.5 to 3.5   History of mechanical aortic valve replacement   Chronic pain   Knee pain, acute   Fall   Leukocytosis   Rhabdomyolysis   Dizziness   Lightheadedness   Generalized weakness   S/P total knee arthroplasty   Elevated bilirubin   Other ascites   Dilated pancreatic duct   MSSA bacteremia   Infection of prosthetic right knee joint (HCC)    Subjective: Afebrile, feeling better. Had picc line placed without difficulty  Medications:  . buPROPion  150 mg Oral Daily  . celecoxib  200 mg Oral BID  . docusate sodium  100 mg Oral BID  . enoxaparin (LOVENOX) injection  40 mg Subcutaneous Q24H  . ferrous sulfate  325 mg Oral Daily  . levothyroxine  100 mcg Oral QAC breakfast  . lidocaine  10 mL Intradermal Once  . losartan  100 mg Oral Daily  . mouth rinse  15 mL Mouth Rinse BID  . polyethylene glycol  17 g Oral BID  . potassium chloride  40 mEq Oral Once  . verapamil  120 mg Oral QHS  . warfarin  2.5 mg Oral ONCE-1800  . Warfarin - Pharmacist Dosing Inpatient   Does not apply q1800    Objective: Vital signs in last 24 hours: Temp:  [97.5 F (36.4 C)-98.5 F (36.9 C)] 98 F (36.7 C) (08/29 1357) Pulse Rate:  [84-114] 114 (08/29 1357) Resp:  [15-17] 16 (08/29 1357) BP: (110-148)/(58-87) 148/87 (08/29 1357) SpO2:  [96 %-100 %] 96 % (08/29 1357) Physical Exam  Constitutional:  oriented to person, place, and time. appears well-developed and  well-nourished. No distress.  HENT: [place]/AT, PERRLA, no scleral icterus Mouth/Throat: Oropharynx is clear and moist. No oropharyngeal exudate.  Cardiovascular: Normal rate, regular rhythm and normal heart sounds. Exam reveals no gallop and no friction rub.  No murmur heard.  Pulmonary/Chest: Effort normal and breath sounds normal. No respiratory distress.  has no wheezes.  Neck = supple, no nuchal rigidity Abdominal: Soft. Bowel sounds are normal.  exhibits no distension. There is no tenderness.  Ext: right leg wrapped with ace bandage, ice bags in place. Right arm picc line in place Neurological: alert and oriented to person, place, and time.  Skin: echymosis on left arm. Anasarca to left arm and lower extremities bilaterally Psychiatric: a normal mood and affect.  behavior is normal.    Lab Results Recent Labs    10/17/17 0453 10/18/17 0435  WBC 36.1* 34.0*  HGB 7.2* 10.0*  HCT 21.7* 28.3*  NA 135 134*  K 4.2 4.1  CL 110 109  CO2 21* 21*  BUN 39* 45*  CREATININE 0.98 1.00   Liver Panel Recent Labs    10/17/17 0453 10/18/17 0435  PROT 4.5*  4.5* 5.1*  ALBUMIN 1.4*  1.5* 1.6*  AST 30  27 30   ALT 5  5 <5  ALKPHOS 92  86 104  BILITOT 5.6*  5.8* 3.9*  BILIDIR 3.5*  --   IBILI 2.1*  --    C-Reactive Protein Recent Labs    10/18/17 0435  CRP 13.7*   Microbiology: reviewed Studies/Results: Mr 3d Recon At Scanner  Result Date: 10/18/2017 CLINICAL DATA:  Elevated liver function tests. Pancreatic ductal dilatation seen on recent ultrasound. Previous cholecystectomy. EXAM: MRI ABDOMEN WITHOUT AND WITH CONTRAST (INCLUDING MRCP) TECHNIQUE: Multiplanar multisequence MR imaging of the abdomen was performed both before and after the administration of intravenous contrast. Heavily T2-weighted images of the biliary and pancreatic ducts were obtained, and three-dimensional MRCP images were rendered by post processing. CONTRAST:  54mL MULTIHANCE GADOBENATE DIMEGLUMINE 529 MG/ML  IV SOLN COMPARISON:  Abdomen ultrasound on 10/16/2017 FINDINGS: Lower chest: Small bilateral pleural effusions and diffuse chest wall edema. Hepatobiliary: Hepatic cirrhosis is demonstrated. No liver masses are identified. Portal and hepatic veins remain patent. Previous cholecystectomy. Mild dilatation of the extrahepatic common bile duct is seen which measures 13 mm, however there is no significant dilatation of intrahepatic bile ducts. Suboptimal visualization due to respiratory motion artifact, however there is no definite evidence of choledocholithiasis. Pancreas: No mass or inflammatory changes. Other visualization is suboptimal due to respiratory motion artifact, the main pancreatic duct measures 4 mm. No mass or acute inflammatory changes identified. Spleen:  Within normal limits in size and appearance. Adrenals/Urinary Tract: No masses identified. No evidence of hydronephrosis. Stomach/Bowel: Visualized portion unremarkable. Vascular/Lymphatic: No pathologically enlarged lymph nodes identified. No abdominal aortic aneurysm. Other:  Diffuse mesenteric and body wall edema.  Mild ascites. Musculoskeletal:  No suspicious bone lesions identified. IMPRESSION: Prior cholecystectomy. Mild dilatation of common bile duct measuring 13 mm. No choledocholithiasis or other obstructing etiology visualized. Mild pancreatic ductal dilatation measuring 4 mm. No evidence of pancreatic mass or acute inflammatory changes. This may be secondary to chronic pancreatitis. Hepatic cirrhosis, mild ascites, and diffuse mesenteric and body wall edema. No evidence of hepatic neoplasm or splenomegaly. Electronically Signed   By: Earle Gell M.D.   On: 10/18/2017 14:39   Mr Abdomen Mrcp Moise Boring Contast  Result Date: 10/18/2017 CLINICAL DATA:  Elevated liver function tests. Pancreatic ductal dilatation seen on recent ultrasound. Previous cholecystectomy. EXAM: MRI ABDOMEN WITHOUT AND WITH CONTRAST (INCLUDING MRCP) TECHNIQUE: Multiplanar  multisequence MR imaging of the abdomen was performed both before and after the administration of intravenous contrast. Heavily T2-weighted images of the biliary and pancreatic ducts were obtained, and three-dimensional MRCP images were rendered by post processing. CONTRAST:  43mL MULTIHANCE GADOBENATE DIMEGLUMINE 529 MG/ML IV SOLN COMPARISON:  Abdomen ultrasound on 10/16/2017 FINDINGS: Lower chest: Small bilateral pleural effusions and diffuse chest wall edema. Hepatobiliary: Hepatic cirrhosis is demonstrated. No liver masses are identified. Portal and hepatic veins remain patent. Previous cholecystectomy. Mild dilatation of the extrahepatic common bile duct is seen which measures 13 mm, however there is no significant dilatation of intrahepatic bile ducts. Suboptimal visualization due to respiratory motion artifact, however there is no definite evidence of choledocholithiasis. Pancreas: No mass or inflammatory changes. Other visualization is suboptimal due to respiratory motion artifact, the main pancreatic duct measures 4 mm. No mass or acute inflammatory changes identified. Spleen:  Within normal limits in size and appearance. Adrenals/Urinary Tract: No masses identified. No evidence of hydronephrosis. Stomach/Bowel: Visualized portion unremarkable. Vascular/Lymphatic: No pathologically enlarged lymph nodes identified. No abdominal aortic aneurysm. Other:  Diffuse mesenteric and body wall edema.  Mild ascites. Musculoskeletal:  No suspicious bone lesions identified. IMPRESSION: Prior cholecystectomy. Mild dilatation of common bile duct  measuring 13 mm. No choledocholithiasis or other obstructing etiology visualized. Mild pancreatic ductal dilatation measuring 4 mm. No evidence of pancreatic mass or acute inflammatory changes. This may be secondary to chronic pancreatitis. Hepatic cirrhosis, mild ascites, and diffuse mesenteric and body wall edema. No evidence of hepatic neoplasm or splenomegaly. Electronically  Signed   By: Earle Gell M.D.   On: 10/18/2017 14:39     Assessment/Plan: MSSA pji = continue on cefazolin. Plan for 6 wk, then monitor off of abtx to see whether she is ready for reimplantation. Will place opat orders tomorrow.  Hyperbilirubinemia = continues to improve and normalize  Leukocytosis = thought to be leukamoid reaction. Starting to decrease.continue to monitor [facility] Cell: [phone] Pager: [phone]  10/18/2017, 3:08 PM

## 2017-10-18 NOTE — Progress Notes (Signed)
     Subjective: 2 Days Post-Op Procedure(s) (LRB): RIGHT TOTAL KNEE RESECTION WITH ANTIBIOTIC SPACER PLACEMENT (N/A)   Patient reports pain as mild, pain controlled.  No events throughout the night. She has already received her PICC line.  Appreciate ID evaluating the patient and treatment plan.   Objective:   VITALS:   Vitals:   10/17/17 2105 10/18/17 0516  BP: (!) 144/72 (!) 110/58  Pulse: 84 91  Resp: 16   Temp: 98.3 F (36.8 C) 98 F (36.7 C)  SpO2: 100% 98%    Dorsiflexion/Plantar flexion intact Incision: dressing C/D/I No cellulitis present Compartment soft  LABS Recent Labs    10/16/17 0517 10/17/17 0453 10/18/17 0435  HGB 8.3* 7.2* 10.0*  HCT 24.0* 21.7* 28.3*  WBC 25.9* 36.1* 34.0*  PLT 188 195 217    Recent Labs    10/16/17 0517 10/17/17 0453 10/18/17 0435  NA 134* 135 134*  K 3.4* 4.2 4.1  BUN 32* 39* 45*  CREATININE 0.75 0.98 1.00  GLUCOSE 69* 100* 78     Assessment/Plan: 2 Days Post-Op Procedure(s) (LRB): RIGHT TOTAL KNEE RESECTION WITH ANTIBIOTIC SPACER PLACEMENT (N/A)  Up with therapy Discharge disposition TBD      [place] Pugh. Manuela Halbur   PAC  10/18/2017, 8:17 AM

## 2017-10-18 NOTE — Care Management Important Message (Signed)
Important Message  Patient Details  Name: LEANNAH GUSE MRN: [id number] Date of Birth: 1940/11/09   Medicare Important Message Given:  Yes    Kerin Salen 10/18/2017, 11:00 AMImportant Message  Patient Details  Name: KASHIRA BEHUNIN MRN: [id number] Date of Birth: [birth year]   Medicare Important Message Given:  Yes    Kerin Salen 10/18/2017, 11:00 AM

## 2017-10-18 NOTE — Progress Notes (Addendum)
ANTICOAGULATION CONSULT NOTE  Pharmacy Consult for warfarin Indication: Aortic mechanical valve   Allergies  Allergen Reactions  . Hydromorphone Hcl Itching    "Dilaudid"  . Meperidine Hcl Itching  . Morphine Sulfate Itching  . Percocet [Oxycodone-Acetaminophen] Itching    Patient Measurements: Height: 5\' 2"  (157.5 cm) Weight: 158 lb (71.7 kg) IBW/kg (Calculated) : 50.1  Vital Signs: Temp: 98 F (36.7 C) (08/29 0516) Temp Source: Oral (08/29 0516) BP: 110/58 (08/29 0516) Pulse Rate: 112 (08/29 1220)  Labs: Recent Labs    10/16/17 0517 10/16/17 0722 10/17/17 0453 10/18/17 0435  HGB 8.3*  --  7.2* 10.0*  HCT 24.0*  --  21.7* 28.3*  PLT 188  --  195 217  LABPROT  --  17.5* 18.9* 22.7*  INR  --  1.44 1.60 2.02  CREATININE 0.75  --  0.98 1.00   Estimated Creatinine Clearance: 44.4 mL/min (by C-G formula based on SCr of 1 mg/dL).  Assessment: 77 yo female with PMH of mechanical aortic vale replacement on chronic warfarin anticoagulation.  Admitted on 8/20 s/p fall with rhabdo, and with PMH of prosthetic TKR; found to have MSSA bacteremia and prosthetic joint infection. Patient on heparin perioperatively, now transitioning back to warfarin 8/27.   Baseline INR subtherapeutic  Prior anticoagulation: warfarin 2.5 mg daily except 5 mg on Mon & Fri, LD 8/20  Significant events:  Heparin gtt 8/21 - 8/26, avoiding warfarin d/t rifampin interxn  8/26: Vit K 1 mg IV   8/27: TKA with spacer placement; GI workup requested for hyperbilirubinemia; rifampin stopped as patient now with spacer and could be worsening tbili, resume warfarin  8/28: PRBC x 2 d/t postop ABLA  Today, 10/18/2017:  CBC: Hgb improved s/p txfusion; Plt stable wnl  INR subtherapeutic but rising appropriately  Major drug interactions: none major, on celecoxib and ppx LMWH while INR low  No bleeding issues per nursing  Eating 50% of meals  Goal of Therapy: INR 2-3 (per [facility] clinic  notes)  Plan:  Repeat warfarin 2.5 mg PO tonight at 18:00; patient seems highly sensitive to warfarin postop despite vitamin K; dosing conservatively now that INR >2.0  Will d/c enoxaparin 40 mg SQ daily per Ortho  Daily INR  CBC at least q72 hr while on warfarin  Monitor for signs of bleeding or thrombosis   Reuel Boom, PharmD, BCPS [phone] 10/18/2017, 12:54 PM

## 2017-10-18 NOTE — Consult Note (Addendum)
   [facility] Progress Note   Chief Complaint:   Elevated bilirubin, cirrhosis on u/s   SUBJECTIVE:    in bedside chair. She feels okay but admits to some mild upper abdominal discomfort which she thinks is related to "all these meds I'm getting in the hospital". Ate breakfast. No constipation.    ASSESSMENT AND PLAN:   1. 77 yo female admitted with acute on chronic knee pain / infected prosthetic knee joint. She is s/p hardware removal and placement of antibiotic spacer this admission.   2. Hyperbilirubinemia, predominantly direct . No abdominal pain. Remainder of liver chemistries unremarkable though she has had mild transaminitis on a few occasions over the years. U/S suggests cirrhosis but also mildly enlarge PD. Doubt obstructive process, suspect hyperbilirubinemia likely related to antibiotic / rifampin and ? Infection.  -Tbili improving 3.9 <<< 5.6. Alk phos remains normal.  -She could have compensated cirrhosis. Etiology of which is unknown at this point. Checking for chronic viral hepatitis, results not yet available. Also ANA is pending.  -MRCP results pending. She is having some mild upper abdominal discomfort so it will be helpful to see MRCP findings.  -I think the remainder of any GI workup can be done as outpatient though would like to see MRCP results prior to discharge  3. Severe hypoalbuminemia, probably multifactorial ( liver disease , malnutrition, acute infection).    4. Leukocytosis, better overnight but still 34K. She is getting Ancef    OBJECTIVE:     Vital signs in last 24 hours: Temp:  [97.5 F (36.4 C)-98.5 F (36.9 C)] 98 F (36.7 C) (08/29 0516) Pulse Rate:  [84-95] 91 (08/29 0516) Resp:  [15-20] 16 (08/28 2105) BP: (110-144)/(58-77) 110/58 (08/29 0516) SpO2:  [96 %-100 %] 98 % (08/29 0516) Last BM Date: 10/17/17 General:   Alert, well-developed,white female in NAD EENT:  Normal hearing, non icteric sclera, conjunctive pink.    Heart:  Regular rate and rhythm; pitting edema BLE Pulm: Normal respiratory effor Abdomen:  Soft, nondistended, nontender.  Normal bowel sounds, no masses felt.     Neurologic:  Alert and  oriented x4;  grossly normal neurologically. Psych:  Pleasant, cooperative.  Normal mood and affect.   Intake/Output from previous day: 08/28 0701 - 08/29 0700 In: 1558 [P.O.:360; I.V.:224; Blood:674; IV Piggyback:300] Out: 200 [Urine:200] Intake/Output this shift: Total I/O In: 100 [IV Piggyback:100] Out: -   Lab Results: Recent Labs    10/16/17 0517 10/17/17 0453 10/18/17 0435  WBC 25.9* 36.1* 34.0*  HGB 8.3* 7.2* 10.0*  HCT 24.0* 21.7* 28.3*  PLT 188 195 217   BMET Recent Labs    10/16/17 0517 10/17/17 0453 10/18/17 0435  NA 134* 135 134*  K 3.4* 4.2 4.1  CL 108 110 109  CO2 20* 21* 21*  GLUCOSE 69* 100* 78  BUN 32* 39* 45*  CREATININE 0.75 0.98 1.00  CALCIUM 7.7* 7.4* 7.7*   LFT Recent Labs    10/17/17 0453 10/18/17 0435  PROT 4.5*  4.5* 5.1*  ALBUMIN 1.4*  1.5* 1.6*  AST 30  27 30  ALT 5  5 <5  ALKPHOS 92  86 104  BILITOT 5.6*  5.8* 3.9*  BILIDIR 3.5*  --   IBILI 2.1*  --    PT/INR Recent Labs    10/17/17 0453 10/18/17 0435  LABPROT 18.9* 22.7*  INR 1.60 2.02     Active Problems:   Hypothyroidism   Hyperlipidemia   ANEMIA   Essential hypertension     Coronary atherosclerosis   S/P right knee replacement   Anticoagulation goal of INR 2.5 to 3.5   History of mechanical aortic valve replacement   Chronic pain   Knee pain, acute   Fall   Leukocytosis   Rhabdomyolysis   Dizziness   Lightheadedness   Generalized weakness   S/P total knee arthroplasty   Elevated bilirubin   Other ascites   Pancreatic duct dilated   MSSA bacteremia   Infection of prosthetic right knee joint (HCC)    LOS: 8 days   Paula Guenther ,NP 10/18/2017, 10:22 AM   Attending physician's note   I have taken an interval history, reviewed the chart and examined  the patient. I agree with the Advanced Practitioner's note, impression, and recommendations as outlined.  MRCP completed with results pending.  Otherwise T bili downtrending since change in antibiotic therapy.  Vito Cirigliano, DO, FACG [phone] office       

## 2017-10-19 ENCOUNTER — Encounter: Payer: Self-pay | Admitting: Nurse Practitioner

## 2017-10-19 ENCOUNTER — Inpatient Hospital Stay (HOSPITAL_COMMUNITY): Payer: Medicare Other

## 2017-10-19 ENCOUNTER — Other Ambulatory Visit: Payer: Self-pay

## 2017-10-19 DIAGNOSIS — E039 Hypothyroidism, unspecified: Secondary | ICD-10-CM

## 2017-10-19 DIAGNOSIS — K7460 Unspecified cirrhosis of liver: Secondary | ICD-10-CM

## 2017-10-19 DIAGNOSIS — M7989 Other specified soft tissue disorders: Secondary | ICD-10-CM

## 2017-10-19 DIAGNOSIS — D72819 Decreased white blood cell count, unspecified: Secondary | ICD-10-CM

## 2017-10-19 LAB — CBC WITH DIFFERENTIAL/PLATELET
BASOS ABS: 0 10*3/uL (ref 0.0–0.1)
Basophils Relative: 0 %
Eosinophils Absolute: 0 10*3/uL (ref 0.0–0.7)
Eosinophils Relative: 0 %
HCT: 28.6 % — ABNORMAL LOW (ref 36.0–46.0)
Hemoglobin: 10 g/dL — ABNORMAL LOW (ref 12.0–15.0)
LYMPHS PCT: 5 %
Lymphs Abs: 1.2 10*3/uL (ref 0.7–4.0)
MCH: 31.8 pg (ref 26.0–34.0)
MCHC: 35 g/dL (ref 30.0–36.0)
MCV: 91.1 fL (ref 78.0–100.0)
MONO ABS: 1.7 10*3/uL — AB (ref 0.1–1.0)
MONOS PCT: 7 %
NEUTROS ABS: 22 10*3/uL — AB (ref 1.7–7.7)
NEUTROS PCT: 88 %
PLATELETS: 220 10*3/uL (ref 150–400)
RBC: 3.14 MIL/uL — ABNORMAL LOW (ref 3.87–5.11)
RDW: 20.6 % — AB (ref 11.5–15.5)
WBC: 24.9 10*3/uL — ABNORMAL HIGH (ref 4.0–10.5)

## 2017-10-19 LAB — COMPREHENSIVE METABOLIC PANEL
ALBUMIN: 1.4 g/dL — AB (ref 3.5–5.0)
ALT: <5 U/L (ref 0–44)
ANION GAP: 4 — AB (ref 5–15)
AST: 34 U/L (ref 15–41)
Alkaline Phosphatase: 110 U/L (ref 38–126)
BUN: 43 mg/dL — ABNORMAL HIGH (ref 8–23)
CALCIUM: 7.7 mg/dL — AB (ref 8.9–10.3)
CHLORIDE: 111 mmol/L (ref 98–111)
CO2: 20 mmol/L — AB (ref 22–32)
Creatinine, Ser: 0.88 mg/dL (ref 0.44–1.00)
GFR calc non Af Amer: >60 mL/min (ref >60)
Glucose, Bld: 84 mg/dL (ref 70–99)
POTASSIUM: 4 mmol/L (ref 3.5–5.1)
Sodium: 135 mmol/L (ref 135–145)
Total Bilirubin: 2.8 mg/dL — ABNORMAL HIGH (ref 0.3–1.2)
Total Protein: 4.6 g/dL — ABNORMAL LOW (ref 6.5–8.1)

## 2017-10-19 LAB — HEPATITIS B SURFACE ANTIGEN: Hepatitis B Surface Ag: NEGATIVE

## 2017-10-19 LAB — ANTINUCLEAR ANTIBODIES, IFA: ANA Ab, IFA: POSITIVE — AB

## 2017-10-19 LAB — SEDIMENTATION RATE: SED RATE: 65 mm/h — AB (ref 0–22)

## 2017-10-19 LAB — PROTIME-INR
INR: 2.88
Prothrombin Time: 29.9 seconds — ABNORMAL HIGH (ref 11.4–15.2)

## 2017-10-19 LAB — GLUCOSE, CAPILLARY: GLUCOSE-CAPILLARY: 145 mg/dL — AB (ref 70–99)

## 2017-10-19 LAB — HEPATITIS C ANTIBODY: HCV AB: 0.2 {s_co_ratio} (ref 0.0–0.9)

## 2017-10-19 LAB — FANA STAINING PATTERNS: Homogeneous Pattern: 1:1280 {titer}

## 2017-10-19 LAB — MAGNESIUM: Magnesium: 2.1 mg/dL (ref 1.7–2.4)

## 2017-10-19 MED ORDER — MAGIC MOUTHWASH
5.0000 mL | Freq: Four times a day (QID) | ORAL | Status: DC | PRN
  Administered 2017-10-20 – 2017-10-21 (×2): 5 mL via ORAL
  Filled 2017-10-19 (×3): qty 5

## 2017-10-19 MED ORDER — FUROSEMIDE 10 MG/ML IJ SOLN
60.0000 mg | Freq: Once | INTRAMUSCULAR | Status: AC
  Administered 2017-10-19: 60 mg via INTRAVENOUS
  Filled 2017-10-19: qty 6

## 2017-10-19 NOTE — Clinical Social Work Note (Signed)
Clinical Social Work Assessment  Patient Details  Name: Paula Ferguson MRN: [id number] Date of Birth: 10-26-1940  Date of referral:  10/19/17               Reason for consult:  Facility Placement, Discharge Planning                Permission sought to share information with:  Facility Art therapist granted to share information::     Name::        Agency::  SNF   Relationship::  Daughters   Contact Information:   [phone]  Housing/Transportation Living arrangements for the past 2 months:  Single Family Home Source of Information:  Patient, Adult Children Patient Interpreter Needed:  None Criminal Activity/Legal Involvement Pertinent to Current Situation/Hospitalization:  No - Comment as needed Significant Relationships:  Adult Children Lives with:  Self Do you feel safe going back to the place where you live?  Yes Need for family participation in patient care:  Yes (Comment)  Care giving concerns:   SNF placement. Patient will need IV antibiotics and rehab in a post acute setting. Patient has PICC placed at this time.  Social Worker assessment / plan:  CSW met with the patient and her daughters at bedside. CSW explain role and reason for visit-to assist with discharge to SNF.  CSW explain the SNF process and provided bed offers. Patient and daughters inquired about bed availability at [facility].  Patient will discharge via [facility].  FL2 completed.  PASRR done.   Plan: SNF  Employment status:  Retired Forensic scientist:  Medicare PT Recommendations:  [place] / Referral to community resources:  [place]  Patient/Family's Response to care:  Agreeable and Responding well to care.   Patient/Family's Understanding of and Emotional Response to Diagnosis, Current Treatment, and Prognosis:  Patient became tearful during visist. Patient wants to get better and feels that her  diagnosis is a burden to her daughters. Patient daughter reassured her that she was not a burden and that her diagnosis was temporary and they will support her throughout the process.   Emotional Assessment Appearance:  Appears stated age Attitude/Demeanor/Rapport:    Affect (typically observed):  Accepting, Appropriate, Calm Orientation:  Oriented to Self, Oriented to Place, Oriented to  Time, Oriented to Situation Alcohol / Substance use:  Not Applicable Psych involvement (Current and /or in the community):  No (Comment)  Discharge Needs  Concerns to be addressed:  Discharge Planning Concerns Readmission within the last 30 days:  No Current discharge risk:  Dependent with Mobility, Physical Impairment Barriers to Discharge:  Continued Medical Work up   [employer], LCSW 10/19/2017, 11:58 AM

## 2017-10-19 NOTE — Progress Notes (Addendum)
[facility] Progress Note   Chief Complaint:   Elevated bilirubin, cirrhosis on u/s   SUBJECTIVE:    feels okay today except for knee pain. No abdominal pain. Twin daughters visiting   ASSESSMENT AND PLAN:   36. 77 yo female admitted with acute on chronic knee pain / infected prosthetic knee joint. She is s/p hardware removal and placement of antibiotic spacer this admission.   2. Hyperbilirubinemia, predominantly direct. Improving, down to 2.8 today. Mild transaminitis on a few occasions over the years and U/S and MRCP both suggest cirrhosis as well as MILD CBD and PD dilation.  Suspect hyperbilirubinemia likely related to antibiotic / rifampin and ? Infection rather than obstructive process, especially with normal Alk phos.  -no evidence for hepatic decompensation. Etiology of cirrhosis unknown. ANA still not available. HBV sAg and HCV antibody negative. Any further workup can be done as outpatient.   -Avoid meds with potential for hepatotoxicity -Follow up with me 9/25 at 1:30p  3. Severe hypoalbuminemia, probably multifactorial ( liver disease , malnutrition, acute infection).    4. Leukocytosis, continues to improved. Down from 34 to 25 overnight. is getting Ancef   OBJECTIVE:     Vital signs in last 24 hours: Temp:  [97.6 F (36.4 C)-98 F (36.7 C)] 97.7 F (36.5 C) (08/30 0549) Pulse Rate:  [90-114] 90 (08/30 0549) Resp:  [14-16] 16 (08/30 0549) BP: (128-148)/(67-90) 128/67 (08/30 0549) SpO2:  [96 %-100 %] 96 % (08/30 0549) Weight:  [74.7 kg] 74.7 kg (08/30 0400) Last BM Date: 10/17/17 General:   Alert, well-developed, white female in NAD EENT:  Normal hearing, non icteric sclera, conjunctive pink.  Heart:  Irreg Irreg, normal rate Pulm: Normal respiratory effort. Abdomen:  Soft, nondistended, nontender.  Normal bowel sounds, no masses felt. Neurologic:  Alert and  oriented x4;  grossly normal neurologically. Psych:  Pleasant, cooperative.   Normal mood and affect.   Intake/Output from previous day: 08/29 0701 - 08/30 0700 In: 1890.1 [P.O.:840; I.V.:550.1; IV Piggyback:500] Out: 200 [Urine:200] Intake/Output this shift: No intake/output data recorded.  Lab Results: Recent Labs    10/17/17 0453 10/18/17 0435 10/19/17 0421  WBC 36.1* 34.0* 24.9*  HGB 7.2* 10.0* 10.0*  HCT 21.7* 28.3* 28.6*  PLT 195 217 220   BMET Recent Labs    10/17/17 0453 10/18/17 0435 10/19/17 0421  NA 135 134* 135  K 4.2 4.1 4.0  CL 110 109 111  CO2 21* 21* 20*  GLUCOSE 100* 78 84  BUN 39* 45* 43*  CREATININE 0.98 1.00 0.88  CALCIUM 7.4* 7.7* 7.7*   LFT Recent Labs    10/17/17 0453  10/19/17 0421  PROT 4.5*  4.5*   < > 4.6*  ALBUMIN 1.4*  1.5*   < > 1.4*  AST 30  27   < > 34  ALT 5  5   < > <5  ALKPHOS 92  86   < > 110  BILITOT 5.6*  5.8*   < > 2.8*  BILIDIR 3.5*  --   --   IBILI 2.1*  --   --    < > = values in this interval not displayed.   PT/INR Recent Labs    10/18/17 0435 10/19/17 0421  LABPROT 22.7* 29.9*  INR 2.02 2.88   Hepatitis Panel Recent Labs    10/17/17 2100  HEPBSAG Negative  HCVAB 0.2    Mr 3d Recon At Scanner  Result Date: 10/18/2017 CLINICAL DATA:  Elevated  liver function tests. Pancreatic ductal dilatation seen on recent ultrasound. Previous cholecystectomy. EXAM: MRI ABDOMEN WITHOUT AND WITH CONTRAST (INCLUDING MRCP) TECHNIQUE: Multiplanar multisequence MR imaging of the abdomen was performed both before and after the administration of intravenous contrast. Heavily T2-weighted images of the biliary and pancreatic ducts were obtained, and three-dimensional MRCP images were rendered by post processing. CONTRAST:  81m MULTIHANCE GADOBENATE DIMEGLUMINE 529 MG/ML IV SOLN COMPARISON:  Abdomen ultrasound on 10/16/2017 FINDINGS: Lower chest: Small bilateral pleural effusions and diffuse chest wall edema. Hepatobiliary: Hepatic cirrhosis is demonstrated. No liver masses are identified. Portal and  hepatic veins remain patent. Previous cholecystectomy. Mild dilatation of the extrahepatic common bile duct is seen which measures 13 mm, however there is no significant dilatation of intrahepatic bile ducts. Suboptimal visualization due to respiratory motion artifact, however there is no definite evidence of choledocholithiasis. Pancreas: No mass or inflammatory changes. Other visualization is suboptimal due to respiratory motion artifact, the main pancreatic duct measures 4 mm. No mass or acute inflammatory changes identified. Spleen:  Within normal limits in size and appearance. Adrenals/Urinary Tract: No masses identified. No evidence of hydronephrosis. Stomach/Bowel: Visualized portion unremarkable. Vascular/Lymphatic: No pathologically enlarged lymph nodes identified. No abdominal aortic aneurysm. Other:  Diffuse mesenteric and body wall edema.  Mild ascites. Musculoskeletal:  No suspicious bone lesions identified. IMPRESSION: Prior cholecystectomy. Mild dilatation of common bile duct measuring 13 mm. No choledocholithiasis or other obstructing etiology visualized. Mild pancreatic ductal dilatation measuring 4 mm. No evidence of pancreatic mass or acute inflammatory changes. This may be secondary to chronic pancreatitis. Hepatic cirrhosis, mild ascites, and diffuse mesenteric and body wall edema. No evidence of hepatic neoplasm or splenomegaly. Electronically Signed   By: JEarle GellM.D.   On: 10/18/2017 14:39   Mr Abdomen Mrcp WMoise BoringContast  Result Date: 10/18/2017 CLINICAL DATA:  Elevated liver function tests. Pancreatic ductal dilatation seen on recent ultrasound. Previous cholecystectomy. EXAM: MRI ABDOMEN WITHOUT AND WITH CONTRAST (INCLUDING MRCP) TECHNIQUE: Multiplanar multisequence MR imaging of the abdomen was performed both before and after the administration of intravenous contrast. Heavily T2-weighted images of the biliary and pancreatic ducts were obtained, and three-dimensional MRCP images  were rendered by post processing. CONTRAST:  155mMULTIHANCE GADOBENATE DIMEGLUMINE 529 MG/ML IV SOLN COMPARISON:  Abdomen ultrasound on 10/16/2017 FINDINGS: Lower chest: Small bilateral pleural effusions and diffuse chest wall edema. Hepatobiliary: Hepatic cirrhosis is demonstrated. No liver masses are identified. Portal and hepatic veins remain patent. Previous cholecystectomy. Mild dilatation of the extrahepatic common bile duct is seen which measures 13 mm, however there is no significant dilatation of intrahepatic bile ducts. Suboptimal visualization due to respiratory motion artifact, however there is no definite evidence of choledocholithiasis. Pancreas: No mass or inflammatory changes. Other visualization is suboptimal due to respiratory motion artifact, the main pancreatic duct measures 4 mm. No mass or acute inflammatory changes identified. Spleen:  Within normal limits in size and appearance. Adrenals/Urinary Tract: No masses identified. No evidence of hydronephrosis. Stomach/Bowel: Visualized portion unremarkable. Vascular/Lymphatic: No pathologically enlarged lymph nodes identified. No abdominal aortic aneurysm. Other:  Diffuse mesenteric and body wall edema.  Mild ascites. Musculoskeletal:  No suspicious bone lesions identified. IMPRESSION: Prior cholecystectomy. Mild dilatation of common bile duct measuring 13 mm. No choledocholithiasis or other obstructing etiology visualized. Mild pancreatic ductal dilatation measuring 4 mm. No evidence of pancreatic mass or acute inflammatory changes. This may be secondary to chronic pancreatitis. Hepatic cirrhosis, mild ascites, and diffuse mesenteric and body wall edema. No evidence of hepatic neoplasm  or splenomegaly. Electronically Signed   By: Earle Gell M.D.   On: 10/18/2017 14:39     Active Problems:   Hypothyroidism   Hyperlipidemia   ANEMIA   Essential hypertension   Coronary atherosclerosis   S/P right knee replacement   Anticoagulation goal  of INR 2.5 to 3.5   History of mechanical aortic valve replacement   Chronic pain   Knee pain, acute   Fall   Leukocytosis   Rhabdomyolysis   Dizziness   Lightheadedness   Generalized weakness   S/P total knee arthroplasty   Elevated bilirubin   Other ascites   Dilated pancreatic duct   MSSA bacteremia   Infection of prosthetic right knee joint ([place])     LOS: 9 days   Tye Savoy ,NP 10/19/2017, 9:33 AM    Attending physician's note   I have taken an interval history, reviewed the chart and examined the patient. I agree with the Advanced Practitioner's note, impression, and recommendations as outlined. TBili continues to downtrend.  Unclear if we can expect full normalization of her T bili given the radiographic evidence of cirrhosis and unknown baseline for her serum bilirubin.  Overall hepatic synthetic function a bit difficult to assess at this time given the drug-induced hyperbilirubinemia, INR on Coumadin, but otherwise normal albumin in January 2019 (3.7) and normal platelets on this admission would at least suggest compensated cirrhosis.  Hypo-albumin on this admission more likely an acute phase reactant. MRCP completed and w/o obstructing lesion, duct dilation, or evidence of choledocholithiasis.  MRCP did show evidence of cirrhotic appearing liver without mass.  There was mild PD dilation at 4 mm.  - Can continue daily trend of liver enzymes for now. - We will plan for any additional work-up of cirrhosis as an outpatient. - Can consider repeat cross-sectional imaging to evaluate for normalization of pancreatic duct as outpatient. - Has outpatient appointment already scheduled. - We will sign off at this time.  Please do not hesitate to contact the on-call GI with additional questions or concerns.  [address] DO, FACG [phone] office

## 2017-10-19 NOTE — Clinical Social Work Placement (Addendum)
Patient not medically ready to transfer today.  CSW updated the facility.  Social work will continue to follow for d/c needs.   CLINICAL SOCIAL WORK PLACEMENT  NOTE  Date:  10/19/2017  Patient Details  Name: Paula Ferguson MRN: [id number] Date of Birth: 07/31/1940  Clinical Social Work is seeking post-discharge placement for this patient at the [place] level of care (*CSW will initial, date and re-position this form in  chart as items are completed):  Yes   Patient/family provided with [facility] Work Department's list of facilities offering this level of care within the geographic area requested by the patient (or if unable, by the patient's family).  Yes   Patient/family informed of their freedom to choose among providers that offer the needed level of care, that participate in Medicare, Medicaid or managed care program needed by the patient, have an available bed and are willing to accept the patient.  Yes   Patient/family informed of [facility]'s ownership interest in [facility] and [facility], as well as of the fact that they are under no obligation to receive care at these facilities.  PASRR submitted to EDS on 10/19/17     PASRR number received on       Existing PASRR number confirmed on       FL2 transmitted to all facilities in geographic area requested by pt/family on 10/19/17     FL2 transmitted to all facilities within larger geographic area on 10/19/17     Patient informed that his/her managed care company has contracts with or will negotiate with certain facilities, including the following:  [facility], [place]     Yes   Patient/family informed of bed offers received.  Patient chooses bed at [facility], [place]     Physician recommends and patient chooses bed at      Patient to be transferred to [place], [place] on 10/19/17.  Patient to be transferred to facility by [facility]     Patient family notified  on 10/19/17 of transfer.  Name of family member notified:  [facility] planning/management officer      PHYSICIAN Please prepare priority discharge summary, including medications     Additional Comment:    _______________________________________________ Lia Hopping, LCSW 10/19/2017, 10:55 AM

## 2017-10-19 NOTE — Progress Notes (Signed)
ANTICOAGULATION CONSULT NOTE  Pharmacy Consult for warfarin Indication: Aortic mechanical valve   Allergies  Allergen Reactions  . Hydromorphone Hcl Itching    "Dilaudid"  . Meperidine Hcl Itching  . Morphine Sulfate Itching  . Percocet [Oxycodone-Acetaminophen] Itching    Patient Measurements: Height: 5\' 2"  (157.5 cm) Weight: 164 lb 11.2 oz (74.7 kg) IBW/kg (Calculated) : 50.1  Vital Signs: Temp: 97.7 F (36.5 C) (08/30 0549) Temp Source: Oral (08/30 0549) BP: 128/67 (08/30 0549) Pulse Rate: 90 (08/30 0549)  Labs: Recent Labs    10/17/17 0453 10/18/17 0435 10/19/17 0421  HGB 7.2* 10.0* 10.0*  HCT 21.7* 28.3* 28.6*  PLT 195 217 220  LABPROT 18.9* 22.7* 29.9*  INR 1.60 2.02 2.88  CREATININE 0.98 1.00 0.88   Estimated Creatinine Clearance: 51.4 mL/min (by C-G formula based on SCr of 0.88 mg/dL).  Assessment: 77 yo female with PMH of mechanical aortic vale replacement on chronic warfarin anticoagulation.  Admitted on 8/20 s/p fall with rhabdo, and with PMH of prosthetic TKR; found to have MSSA bacteremia and prosthetic joint infection. Patient on heparin perioperatively, transitioned back to warfarin on 8/27.   Baseline INR subtherapeutic  Prior anticoagulation: warfarin 2.5 mg daily except 5 mg on Mon & Fri, LD 8/20  Significant events:  Heparin gtt 8/21 - 8/26, avoiding warfarin d/t rifampin interaction  8/26: Vit K 1 mg IV   8/27: TKA with spacer placement; GI workup requested for hyperbilirubinemia; rifampin stopped as patient now with spacer and could be worsening tbili, resume warfarin  8/28: PRBC x 2 d/t postop ABLA  8/29 Enoxaparin 40 mg subQ daily discontinued as INR therapeutic  Today, 10/19/2017:  CBC: Hgb improved stable s/p transfusion on 8/28, Plt stable WNL  INR = 2.9 is therapeutic but significantly increased from yesterday  Confirmed INR goal of 2-3 with patient and daughter. Daughter reports that INR was elevated ~4 recently PTA. Pt  seems to be sensitive to warfarin this admission - would recommend decreasing dose on discharge.  Major drug interactions: none major, on celecoxib   No bleeding issues per nursing  Eating 50% of meals  Goal of Therapy: INR 2-3 (per [facility] clinic notes, confirmed with patient and daughter)  Plan:  Will hold warfarin this evening given significant increase in INR from yesterday, anticipate INR may increase tomorrow as a reflection of 5 mg dose; patient seems highly sensitive to warfarin postop despite vitamin K; dosing conservatively now that INR >2.0. Would recommend discharging pt on lower dose than she was taking PTA.  Daily INR  CBC at least q72 hr while on warfarin  Monitor for signs of bleeding or thrombosis  Lenis Noon, PharmD, BCPS Clinical Pharmacist 10/19/17 1:28 PM

## 2017-10-19 NOTE — Plan of Care (Signed)
Plan of care discussed with patient and daughter. Plan is for patient to d/c to SNF tomorrow

## 2017-10-19 NOTE — Progress Notes (Signed)
PT Cancellation Note  Patient Details Name: CARRIN VANNOSTRAND MRN: [id number] DOB: Sep 30, 1940   Cancelled Treatment:    Reason Eval/Treat Not Completed: Patient at procedure or test/unavailable(pt having dopplers done at present, will follow. )   Philomena Doheny 10/19/2017, 2:13 PM [phone]

## 2017-10-19 NOTE — Progress Notes (Signed)
     Subjective: 3 Days Post-Op Procedure(s) (LRB): RIGHT TOTAL KNEE RESECTION WITH ANTIBIOTIC SPACER PLACEMENT (N/A)   Patient reports pain as mild, pain controlled.  No reported events throughout the night. She feels that she is doing well and getting better overall.  Discussed the IV antibiotic use and expectations.    Objective:   VITALS:   Vitals:   10/18/17 2142 10/19/17 0549  BP: (!) 144/90 128/67  Pulse: 95 90  Resp: 14 16  Temp: 97.6 F (36.4 C) 97.7 F (36.5 C)  SpO2: 100% 96%    Incision: dressing C/D/I No cellulitis present Compartment soft  LABS Recent Labs    10/17/17 0453 10/18/17 0435 10/19/17 0421  HGB 7.2* 10.0* 10.0*  HCT 21.7* 28.3* 28.6*  WBC 36.1* 34.0* 24.9*  PLT 195 217 220    Recent Labs    10/17/17 0453 10/18/17 0435 10/19/17 0421  NA 135 134* 135  K 4.2 4.1 4.0  BUN 39* 45* 43*  CREATININE 0.98 1.00 0.88  GLUCOSE 100* 78 84     Assessment/Plan: 3 Days Post-Op Procedure(s) (LRB): RIGHT TOTAL KNEE RESECTION WITH ANTIBIOTIC SPACER PLACEMENT (N/A) Orthopaedically she is stable with regards to the right knee Continue antibiotic treatment as recommended by ID  Up with therapy for safety with ambulation Discharge disposition TBD with regards to progress   [place]. Rahiem Schellinger   PAC  10/19/2017, 8:07 AM

## 2017-10-19 NOTE — Progress Notes (Signed)
Patient ID: Paula Ferguson, female   DOB: [birth year], 77 y.o.   MRN: [id number]  PROGRESS NOTE    Paula Ferguson  [id number]:[id number] DOB: [birth year] DOA: 10/09/2017 PCP: Marletta Lor, MD   Brief Narrative:  77 year old female with history of CAD status post CABG and history of AVR, hypertension, hyperlipidemia, hypothyroidism, anemia, depression and other comorbidities presented with worsened knee pain after a fall. She was admitted with right severe knee pain along with mild rhabdomyolysis.  She was found to have MSSA bacteremia.  ID and orthopedics were consulted.  TEE was negative for vegetations.  She underwent Right total knee resection with antibiotic spacer placement on 10/16/2017 by orthopedics.  GI was consulted for elevated bilirubin.  Assessment & Plan:   Active Problems:   Hypothyroidism   Hyperlipidemia   ANEMIA   Essential hypertension   Coronary atherosclerosis   S/P right knee replacement   Anticoagulation goal of INR 2.5 to 3.5   History of mechanical aortic valve replacement   Chronic pain   Knee pain, acute   Fall   Leukocytosis   Rhabdomyolysis   Dizziness   Lightheadedness   Generalized weakness   S/P total knee arthroplasty   Elevated bilirubin   Other ascites   Dilated pancreatic duct   MSSA bacteremia   Infection of prosthetic right knee joint (HCC)    MSSA bacteremia with leukocytosis probably from prosthetic right knee joint infection -ID following: Initially started on Ancef and rifampin but rifampin was discontinued on 10/16/2017 by ID.  Continue Ancef for a total of 6 weeks.  Outpatient follow-up with ID. -Repeat blood cultures on 10/11/2017 show no growth to date -status post TEE, no vegetations noted -leukocytosis improving: Repeat white count in a.m.  Spoke with Dr. Luvenia Heller who recommends that we repeat CBC in a.m. and if it continues to improve, patient might be able to be discharged. -Currently not spiking  temperatures  Prosthetic right knee joint infection -Orthopedics following: Status post right total knee resection and antibiotic spacer placement on 10/16/2017.  Follow further orthopedics recommendations.  Wound care as per orthopedics recommendations -PT/OT eval.  Fall precautions  Acute rhabdomyolysis -Resolved.  Coronary artery disease with history of CABG and AVR -Currently chest pain-free -Continue losartan, verapamil -Continue Coumadin.  INR is therapeutic.  Discontinue Lovenox.  Statin held because of mild rhabdomyolysis.    Supratherapeutic INR -Resolved.  Currently INR is therapeutic.  Continue Coumadin for INR goal to be between 2.5-3.5  Essential hypertension -Continue losartan, verapamil.  Blood pressure controlled  Hyperlipidemia -Statin held due to rhabdomyolysis   Depression -Continue bupropion  Acute blood loss anemia -Probably secondary to perioperative blood loss.  Status post 2 unit packed red cells transfusion on 10/17/2017.  Hemoglobin 10 today.  Monitor CBC.  Hypothyroidism -Continue Synthroid  Hypokalemia -Resolved with replacement  Hypoalbuminemia with fluid overload -Follow nutrition recommendations -Try empiric Lasix 1 dose IV.  Strict input and output.  Daily weights.  Check bilateral lower extremity and left upper extremity venous duplex to rule out DVT  Elevated bilirubin -?rifampin -GI following.  MRCP showing mild CBD and pancreatic duct dilatation.  Bilirubin improving.  Outpatient follow-up with GI  Generalized deconditioning -PT/OT recommends SNF.  Probable discharge to SNF tomorrow if patient remains stable  DVT prophylaxis: Coumadin Code Status: Full Family Communication: None at bedside Disposition Plan: SNF tomorrow if patient remains stable  Consultants: Orthopedic/ID/GI  Procedures:Right total knee resection with antibiotic spacer placement on 10/16/2017 by orthopedics  Antimicrobials: Ancef from  10/10/2017  onwards Rifampin 10/11/2017-10/17/2017  Subjective: Patient seen and examined at bedside.  She complains of some soreness in her mouth.  Complains of left upper extremity and lower extremities swelling.  No overnight fever or vomiting   objective: Vitals:   10/18/17 1357 10/18/17 2142 10/19/17 0400 10/19/17 0549  BP: (!) 148/87 (!) 144/90  128/67  Pulse: (!) 114 95  90  Resp: 16 14  16   Temp: 98 F (36.7 C) 97.6 F (36.4 C)  97.7 F (36.5 C)  TempSrc: Oral Oral  Oral  SpO2: 96% 100%  96%  Weight:   74.7 kg   Height:   5\' 2"  (1.575 m)     Intake/Output Summary (Last 24 hours) at 10/19/2017 1141 Last data filed at 10/19/2017 0600 Gross per 24 hour  Intake 1670.12 ml  Output 200 ml  Net 1470.12 ml   Filed Weights   10/16/17 0609 10/17/17 0500 10/19/17 0400  Weight: 65.4 kg 71.7 kg 74.7 kg    Examination:  General exam: No distress.  Lying in bed Respiratory system: Bilateral decreased breath sounds at bases Cardiovascular system: Rate controlled, S1-S2 heard Gastrointestinal system: Abdomen is nondistended, soft and nontender. Normal bowel sounds heard. Extremities: No cyanosis; right knee dressing present.  Bilateral lower extremity pitting edema along with left upper extremity pitting edema   Data Reviewed: I have personally reviewed following labs and imaging studies  CBC: Recent Labs  Lab 10/15/17 0409 10/16/17 0517 10/17/17 0453 10/18/17 0435 10/19/17 0421  WBC 24.2* 25.9* 36.1* 34.0* 24.9*  NEUTROABS 20.1*  --  32.7  --  22.0*  HGB 8.5* 8.3* 7.2* 10.0* 10.0*  HCT 24.7* 24.0* 21.7* 28.3* 28.6*  MCV 93.2 93.4 97.3 88.7 91.1  PLT 177 188 195 217 193   Basic Metabolic Panel: Recent Labs  Lab 10/13/17 1311  10/15/17 0409 10/16/17 0517 10/17/17 0453 10/18/17 0435 10/19/17 0421  NA  --    < > 137 134* 135 134* 135  K  --    < > 3.6 3.4* 4.2 4.1 4.0  CL  --    < > 110 108 110 109 111  CO2  --    < > 21* 20* 21* 21* 20*  GLUCOSE  --    < > 63* 69* 100*  78 84  BUN  --    < > 29* 32* 39* 45* 43*  CREATININE  --    < > 0.67 0.75 0.98 1.00 0.88  CALCIUM  --    < > 7.6* 7.7* 7.4* 7.7* 7.7*  MG 1.8  --   --   --   --  2.1 2.1   < > = values in this interval not displayed.   GFR: Estimated Creatinine Clearance: 51.4 mL/min (by C-G formula based on SCr of 0.88 mg/dL). Liver Function Tests: Recent Labs  Lab 10/15/17 0409 10/15/17 1350 10/16/17 0517 10/17/17 0453 10/18/17 0435 10/19/17 0421  AST 28  --  27 30  27 30  34  ALT 6  --  6 5  5  <5 <5  ALKPHOS 79  --  79 92  86 104 110  BILITOT 4.1* 5.0* 6.0* 5.6*  5.8* 3.9* 2.8*  PROT 5.0*  --  4.8* 4.5*  4.5* 5.1* 4.6*  ALBUMIN 1.7*  --  1.7* 1.4*  1.5* 1.6* 1.4*   No results for input(s): LIPASE, AMYLASE in the last 168 hours. No results for input(s): AMMONIA in the last 168 hours. Coagulation Profile: Recent Labs  Lab 10/15/17 0409 10/16/17 0722 10/17/17 0453 10/18/17 0435 10/19/17 0421  INR 3.34 1.44 1.60 2.02 2.88   Cardiac Enzymes: Recent Labs  Lab 10/13/17 1311  CKTOTAL 70   BNP (last 3 results) No results for input(s): PROBNP in the last 8760 hours. HbA1C: No results for input(s): HGBA1C in the last 72 hours. CBG: Recent Labs  Lab 10/16/17 0748 10/16/17 1031 10/17/17 0729 10/18/17 0747 10/18/17 1351  GLUCAP 61* 83 85 77 109*   Lipid Profile: No results for input(s): CHOL, HDL, LDLCALC, TRIG, CHOLHDL, LDLDIRECT in the last 72 hours. Thyroid Function Tests: No results for input(s): TSH, T4TOTAL, FREET4, T3FREE, THYROIDAB in the last 72 hours. Anemia Panel: No results for input(s): VITAMINB12, FOLATE, FERRITIN, TIBC, IRON, RETICCTPCT in the last 72 hours. Sepsis Labs: No results for input(s): PROCALCITON, LATICACIDVEN in the last 168 hours.  Recent Results (from the past 240 hour(s))  Culture, blood (routine x 2)     Status: Abnormal   Collection Time: 10/09/17 12:29 PM  Result Value Ref Range Status   Specimen Description   Final    BLOOD RIGHT  ANTECUBITAL Performed at [facility] [address] [place], [place] [address]    Special Requests   Final    BOTTLES DRAWN AEROBIC AND ANAEROBIC Blood Culture results may not be optimal due to an inadequate volume of blood received in culture bottles Performed at [facility] [address] [place], [place] [address]    Culture  Setup Time   Final    GRAM POSITIVE COCCI IN BOTH AEROBIC AND ANAEROBIC BOTTLES CRITICAL VALUE NOTED.  VALUE IS CONSISTENT WITH PREVIOUSLY REPORTED AND CALLED VALUE.    Culture (A)  Final    STAPHYLOCOCCUS AUREUS SUSCEPTIBILITIES PERFORMED ON PREVIOUS CULTURE WITHIN THE LAST 5 DAYS. Performed at [facility], [place] [address], [place], [place] [address]    Report Status 10/12/2017 FINAL  Final  Culture, blood (routine x 2)     Status: Abnormal   Collection Time: 10/09/17 12:34 PM  Result Value Ref Range Status   Specimen Description   Final    BLOOD LEFT ANTECUBITAL Performed at [facility] [address], [place], [place] [address]    Special Requests   Final    BOTTLES DRAWN AEROBIC AND ANAEROBIC Blood Culture adequate volume Performed at [facility] [address], [place], [place] [address]    Culture  Setup Time   Final    GRAM POSITIVE COCCI IN CLUSTERS IN BOTH AEROBIC AND ANAEROBIC BOTTLES CRITICAL RESULT CALLED TO, READ BACK BY AND VERIFIED WITH: Karel Jarvis PharmD 9:50 10/10/17 (wilsonm) Performed at [facility], [place] [address], [place], [place] [address]    Culture STAPHYLOCOCCUS AUREUS (A)  Final   Report Status 10/12/2017 FINAL  Final   Organism ID, Bacteria STAPHYLOCOCCUS AUREUS  Final      Susceptibility   Staphylococcus aureus - MIC*    CIPROFLOXACIN <=0.5 SENSITIVE Sensitive     ERYTHROMYCIN >=8 RESISTANT Resistant     GENTAMICIN <=0.5 SENSITIVE Sensitive     OXACILLIN 0.5 SENSITIVE Sensitive     TETRACYCLINE <=1 SENSITIVE Sensitive      VANCOMYCIN <=0.5 SENSITIVE Sensitive     TRIMETH/SULFA <=10 SENSITIVE Sensitive     CLINDAMYCIN <=0.25 SENSITIVE Sensitive     RIFAMPIN <=0.5 SENSITIVE Sensitive     Inducible Clindamycin NEGATIVE Sensitive     * STAPHYLOCOCCUS AUREUS  Blood Culture ID Panel (Reflexed)  Status: Abnormal   Collection Time: 10/09/17 12:34 PM  Result Value Ref Range Status   Enterococcus species NOT DETECTED NOT DETECTED Final   Listeria monocytogenes NOT DETECTED NOT DETECTED Final   Staphylococcus species DETECTED (A) NOT DETECTED Final    Comment: CRITICAL RESULT CALLED TO, READ BACK BY AND VERIFIED WITH: Karel Jarvis PharmD 9:50 10/10/17 (wilsonm)    Staphylococcus aureus DETECTED (A) NOT DETECTED Final    Comment: Methicillin (oxacillin) susceptible Staphylococcus aureus (MSSA). Preferred therapy is anti staphylococcal beta lactam antibiotic (Cefazolin or Nafcillin), unless clinically contraindicated. CRITICAL RESULT CALLED TO, READ BACK BY AND VERIFIED WITH: Karel Jarvis PharmD 9:50 10/10/17 (wilsonm)    Methicillin resistance NOT DETECTED NOT DETECTED Final   Streptococcus species NOT DETECTED NOT DETECTED Final   Streptococcus agalactiae NOT DETECTED NOT DETECTED Final   Streptococcus pneumoniae NOT DETECTED NOT DETECTED Final   Streptococcus pyogenes NOT DETECTED NOT DETECTED Final   Acinetobacter baumannii NOT DETECTED NOT DETECTED Final   Enterobacteriaceae species NOT DETECTED NOT DETECTED Final   Enterobacter cloacae complex NOT DETECTED NOT DETECTED Final   Escherichia coli NOT DETECTED NOT DETECTED Final   Klebsiella oxytoca NOT DETECTED NOT DETECTED Final   Klebsiella pneumoniae NOT DETECTED NOT DETECTED Final   Proteus species NOT DETECTED NOT DETECTED Final   Serratia marcescens NOT DETECTED NOT DETECTED Final   Haemophilus influenzae NOT DETECTED NOT DETECTED Final   Neisseria meningitidis NOT DETECTED NOT DETECTED Final   Pseudomonas aeruginosa NOT DETECTED NOT DETECTED Final   Candida  albicans NOT DETECTED NOT DETECTED Final   Candida glabrata NOT DETECTED NOT DETECTED Final   Candida krusei NOT DETECTED NOT DETECTED Final   Candida parapsilosis NOT DETECTED NOT DETECTED Final   Candida tropicalis NOT DETECTED NOT DETECTED Final    Comment: Performed at [facility], [address] [place], [place] [address]  Urine culture     Status: Abnormal   Collection Time: 10/10/17  2:18 AM  Result Value Ref Range Status   Specimen Description   Final    URINE, RANDOM Performed at [facility] [address] [place], [place] [address]    Special Requests   Final    NONE Performed at [facility], [place] [address] [place], [place] [address]    Culture MULTIPLE SPECIES PRESENT, SUGGEST RECOLLECTION (A)  Final   Report Status 10/12/2017 FINAL  Final  Aerobic/Anaerobic Culture (surgical/deep wound)     Status: None   Collection Time: 10/10/17  5:45 PM  Result Value Ref Range Status   Specimen Description   Final    SYNOVIAL Performed at [facility] [address] [place], [place] [address]    Special Requests   Final    NONE Performed at [facility], [place] [address] [place], [place] [address]    Gram Stain   Final    ABUNDANT WBC PRESENT, PREDOMINANTLY PMN ABUNDANT GRAM POSITIVE COCCI IN PAIRS IN CHAINS IN CLUSTERS Gram Stain Report Called to,Read Back By and Verified With: S.WRIGHT AT 1929 ON 10/10/17 BY N.THOMPSON Performed at [facility], [place] [address] [place], [place] [address]    Culture   Final    MODERATE STAPHYLOCOCCUS AUREUS NO ANAEROBES ISOLATED Performed at [facility], [place] [address] [place],  [address]    Report Status 10/15/2017 FINAL  Final   Organism ID, Bacteria STAPHYLOCOCCUS AUREUS  Final      Susceptibility   Staphylococcus  aureus - MIC*    CIPROFLOXACIN <=0.5 SENSITIVE Sensitive     ERYTHROMYCIN >=8 RESISTANT Resistant      GENTAMICIN <=0.5 SENSITIVE Sensitive     OXACILLIN 0.5 SENSITIVE Sensitive     TETRACYCLINE <=1 SENSITIVE Sensitive     VANCOMYCIN 1 SENSITIVE Sensitive     TRIMETH/SULFA <=10 SENSITIVE Sensitive     CLINDAMYCIN <=0.25 SENSITIVE Sensitive     RIFAMPIN <=0.5 SENSITIVE Sensitive     Inducible Clindamycin NEGATIVE Sensitive     * MODERATE STAPHYLOCOCCUS AUREUS  Culture, blood (routine x 2)     Status: None   Collection Time: 10/11/17  1:09 PM  Result Value Ref Range Status   Specimen Description   Final    BLOOD RIGHT ANTECUBITAL Performed at [facility] [address], [place], [place] [address]    Special Requests   Final    BOTTLES DRAWN AEROBIC AND ANAEROBIC Blood Culture adequate volume Performed at [facility] [address], [place], [place] [address]    Culture   Final    NO GROWTH 5 DAYS Performed at [facility], [place] [address], [place], [place] [address]    Report Status 10/16/2017 FINAL  Final  Culture, blood (routine x 2)     Status: None   Collection Time: 10/11/17  1:13 PM  Result Value Ref Range Status   Specimen Description   Final    BLOOD BLOOD RIGHT FOREARM Performed at [facility] [address] [place], [place] [address]    Special Requests   Final    BOTTLES DRAWN AEROBIC AND ANAEROBIC Blood Culture adequate volume Performed at [facility] [address] [place], [place] [address]    Culture   Final    NO GROWTH 5 DAYS Performed at [facility], [place] [address], [place], [place] [address]    Report Status 10/16/2017 FINAL  Final         Radiology Studies: Mr 3d Recon At Scanner  Result Date: 10/18/2017 CLINICAL DATA:  Elevated liver function tests. Pancreatic ductal dilatation seen on recent ultrasound. Previous cholecystectomy. EXAM: MRI ABDOMEN WITHOUT AND WITH CONTRAST (INCLUDING MRCP) TECHNIQUE: Multiplanar multisequence MR imaging of the abdomen  was performed both before and after the administration of intravenous contrast. Heavily T2-weighted images of the biliary and pancreatic ducts were obtained, and three-dimensional MRCP images were rendered by post processing. CONTRAST:  56mL MULTIHANCE GADOBENATE DIMEGLUMINE 529 MG/ML IV SOLN COMPARISON:  Abdomen ultrasound on 10/16/2017 FINDINGS: Lower chest: Small bilateral pleural effusions and diffuse chest wall edema. Hepatobiliary: Hepatic cirrhosis is demonstrated. No liver masses are identified. Portal and hepatic veins remain patent. Previous cholecystectomy. Mild dilatation of the extrahepatic common bile duct is seen which measures 13 mm, however there is no significant dilatation of intrahepatic bile ducts. Suboptimal visualization due to respiratory motion artifact, however there is no definite evidence of choledocholithiasis. Pancreas: No mass or inflammatory changes. Other visualization is suboptimal due to respiratory motion artifact, the main pancreatic duct measures 4 mm. No mass or acute inflammatory changes identified. Spleen:  Within normal limits in size and appearance. Adrenals/Urinary Tract: No masses identified. No evidence of hydronephrosis. Stomach/Bowel: Visualized portion unremarkable. Vascular/Lymphatic: No pathologically enlarged lymph nodes identified. No abdominal aortic aneurysm. Other:  Diffuse mesenteric and body wall edema.  Mild ascites. Musculoskeletal:  No suspicious bone lesions identified. IMPRESSION: Prior cholecystectomy. Mild dilatation of common bile duct measuring 13 mm. No choledocholithiasis or other obstructing  etiology visualized. Mild pancreatic ductal dilatation measuring 4 mm. No evidence of pancreatic mass or acute inflammatory changes. This may be secondary to chronic pancreatitis. Hepatic cirrhosis, mild ascites, and diffuse mesenteric and body wall edema. No evidence of hepatic neoplasm or splenomegaly. Electronically Signed   By: Earle Gell M.D.   On:  10/18/2017 14:39   Mr Abdomen Mrcp Moise Boring Contast  Result Date: 10/18/2017 CLINICAL DATA:  Elevated liver function tests. Pancreatic ductal dilatation seen on recent ultrasound. Previous cholecystectomy. EXAM: MRI ABDOMEN WITHOUT AND WITH CONTRAST (INCLUDING MRCP) TECHNIQUE: Multiplanar multisequence MR imaging of the abdomen was performed both before and after the administration of intravenous contrast. Heavily T2-weighted images of the biliary and pancreatic ducts were obtained, and three-dimensional MRCP images were rendered by post processing. CONTRAST:  44mL MULTIHANCE GADOBENATE DIMEGLUMINE 529 MG/ML IV SOLN COMPARISON:  Abdomen ultrasound on 10/16/2017 FINDINGS: Lower chest: Small bilateral pleural effusions and diffuse chest wall edema. Hepatobiliary: Hepatic cirrhosis is demonstrated. No liver masses are identified. Portal and hepatic veins remain patent. Previous cholecystectomy. Mild dilatation of the extrahepatic common bile duct is seen which measures 13 mm, however there is no significant dilatation of intrahepatic bile ducts. Suboptimal visualization due to respiratory motion artifact, however there is no definite evidence of choledocholithiasis. Pancreas: No mass or inflammatory changes. Other visualization is suboptimal due to respiratory motion artifact, the main pancreatic duct measures 4 mm. No mass or acute inflammatory changes identified. Spleen:  Within normal limits in size and appearance. Adrenals/Urinary Tract: No masses identified. No evidence of hydronephrosis. Stomach/Bowel: Visualized portion unremarkable. Vascular/Lymphatic: No pathologically enlarged lymph nodes identified. No abdominal aortic aneurysm. Other:  Diffuse mesenteric and body wall edema.  Mild ascites. Musculoskeletal:  No suspicious bone lesions identified. IMPRESSION: Prior cholecystectomy. Mild dilatation of common bile duct measuring 13 mm. No choledocholithiasis or other obstructing etiology visualized. Mild  pancreatic ductal dilatation measuring 4 mm. No evidence of pancreatic mass or acute inflammatory changes. This may be secondary to chronic pancreatitis. Hepatic cirrhosis, mild ascites, and diffuse mesenteric and body wall edema. No evidence of hepatic neoplasm or splenomegaly. Electronically Signed   By: Earle Gell M.D.   On: 10/18/2017 14:39        Scheduled Meds: . buPROPion  150 mg Oral Daily  . celecoxib  200 mg Oral BID  . docusate sodium  100 mg Oral BID  . ferrous sulfate  325 mg Oral Daily  . levothyroxine  100 mcg Oral QAC breakfast  . lidocaine  10 mL Intradermal Once  . losartan  100 mg Oral Daily  . mouth rinse  15 mL Mouth Rinse BID  . polyethylene glycol  17 g Oral BID  . potassium chloride  40 mEq Oral Once  . verapamil  120 mg Oral QHS  . Warfarin - Pharmacist Dosing Inpatient   Does not apply q1800   Continuous Infusions: . sodium chloride 30 mL/hr at 10/18/17 1830  .  ceFAZolin (ANCEF) IV 2 g (10/19/17 1112)     LOS: 9 days        Aline August, MD [facility] Pager [phone]  If 7PM-7AM, please contact night-coverage [email or web address] Password [email or web address] 10/19/2017, 11:41 AM

## 2017-10-19 NOTE — Progress Notes (Signed)
Preliminary notes---Bilateral lower extremities venous duplex exam completed. Positive for age indeterminate thrombosis including bilateral posterior tibial and peroneal veins. Right common femoral vein and saphenous junction vessel wall thickening with adequate flow detected. Sever pitting edema noted.                              ---Left upper extremity venous duplex exam completed.  Negative for deep vein and superficial veins' thrombosis. A hyperechoic pair of linear line seen in right subclavian vein.   Hongying Milo Schreier (RDMS RVT) 10/19/17 3:31 PM

## 2017-10-19 NOTE — Progress Notes (Signed)
PHARMACY CONSULT NOTE FOR:  OUTPATIENT  PARENTERAL ANTIBIOTIC THERAPY (OPAT)  Indication: Prosthetic joint infection Regimen: Cefazolin 2 g IV q8h End date: October 9th, 2019  IV antibiotic discharge orders are pended. To discharging provider:  please sign these orders via discharge navigator,  Select New Orders & click on the button choice - Manage This Unsigned Work.     Thank you for allowing pharmacy to be a part of this patient's care.  Lenis Noon, PharmD, BCPS Clinical Pharmacist 10/19/2017, 12:50 PM

## 2017-10-19 NOTE — NC FL2 (Addendum)
[place] LEVEL OF CARE SCREENING TOOL     IDENTIFICATION  Patient Name: Paula Ferguson Birthdate: 01-09-1941 Sex: female Admission Date (Current Location): 10/09/2017  [facility] and [place] Number:  Herbalist and Address:  [facility],  [place] [address], [place]      Provider Number: [id number]  Attending Physician Name and Address:  Aline August, MD  Relative Name and Phone Number:       Current Level of Care: Hospital Recommended Level of Care: [place] Prior Approval Number:    Date Approved/Denied:   PASRR Number:   [id number]   Discharge Plan: SNF    Current Diagnoses: Patient Active Problem List   Diagnosis Date Noted  . MSSA bacteremia 10/18/2017  . Infection of prosthetic right knee joint ([place]) 10/18/2017  . Elevated bilirubin   . Other ascites   . Dilated pancreatic duct   . S/P total knee arthroplasty 10/16/2017  . Knee pain, acute 10/09/2017  . Fall 10/09/2017  . Leukocytosis 10/09/2017  . Rhabdomyolysis 10/09/2017  . Dizziness 10/09/2017  . Lightheadedness 10/09/2017  . Generalized weakness 10/09/2017  . Neutropenia ([place]) 10/03/2017  . Encounter for therapeutic drug monitoring 09/03/2017  . Chronic pain 03/01/2017  . History of mechanical aortic valve replacement 11/22/2012  . Anticoagulation goal of INR 2.5 to 3.5 09/11/2011  . S/P right knee replacement 03/21/2011  . ANEMIA 09/15/2009  . LIVER FUNCTION TESTS, ABNORMAL, HX OF 01/12/2009  . Coronary atherosclerosis 08/26/2007  . OLECRANON BURSITIS, LEFT 08/26/2007  . Hyperlipidemia 02/07/2007  . Osteoarthritis 02/07/2007  . Hypothyroidism 09/14/2006  . Essential hypertension 09/14/2006    Orientation RESPIRATION BLADDER Height & Weight     Self, Time, Situation, Place  Normal Incontinent Weight: 164 lb 11.2 oz (74.7 kg) Height:  5\' 2"  (157.5 cm)  BEHAVIORAL SYMPTOMS/MOOD NEUROLOGICAL BOWEL NUTRITION STATUS      Continent  Diet(Heart Healthy )  AMBULATORY STATUS COMMUNICATION OF NEEDS Skin   Extensive Assist Verbally Surgical wounds(Right Knee)                       Personal Care Assistance Level of Assistance  Bathing, Feeding, Dressing Bathing Assistance: Maximum assistance Feeding assistance: Independent Dressing Assistance: Maximum assistance     Functional Limitations Info  Sight, Hearing, Speech Sight Info: Adequate Hearing Info: Adequate Speech Info: Adequate    SPECIAL CARE FACTORS FREQUENCY  PT (By licensed PT), OT (By licensed OT)     PT Frequency: 5x/week OT Frequency: 5x/week            Contractures Contractures Info: Not present    Additional Factors Info  Code Status, Allergies, Psychotropic Code Status Info: Fullcode  Allergies Info: Hydromorphone Hcl, Meperidine Hcl, Morphine Sulfate, Percocet Oxycodone-acetaminophen Psychotropic Info: Wellbutrin          Current Medications (10/19/2017):  This is the current hospital active medication list Current Facility-Administered Medications  Medication Dose Route Frequency Provider Last Rate Last Dose  . 0.9 %  sodium chloride infusion   Intravenous PRN Danae Orleans, PA-C 30 mL/hr at 10/18/17 1830    . acetaminophen (TYLENOL) tablet 650 mg  650 mg Oral Q6H PRN Danae Orleans, PA-C   650 mg at 10/13/17 1117   Or  . acetaminophen (TYLENOL) suppository 650 mg  650 mg Rectal Q6H PRN Danae Orleans, PA-C      . alum & mag hydroxide-simeth (MAALOX/MYLANTA) 200-200-20 MG/5ML suspension 15 mL  15 mL Oral Q4H  PRN Danae Orleans, PA-C      . bisacodyl (DULCOLAX) suppository 10 mg  10 mg Rectal Daily PRN Danae Orleans, PA-C   10 mg at 10/15/17 1245  . buPROPion [facility] SR) 12 hr tablet 150 mg  150 mg Oral Daily Danae Orleans, PA-C   150 mg at 10/18/17 1040  . ceFAZolin (ANCEF) IVPB 2g/100 mL premix  2 g Intravenous Q8H Babish, Rodman Key, PA-C 200 mL/hr at 10/19/17 0359 2 g at 10/19/17 0359  . celecoxib (CELEBREX) capsule  200 mg  200 mg Oral BID Danae Orleans, PA-C   200 mg at 10/18/17 2003  . diclofenac sodium (VOLTAREN) 1 % transdermal gel 2 g  2 g Topical PRN Danae Orleans, PA-C   2 g at 10/14/17 1457  . docusate sodium (COLACE) capsule 100 mg  100 mg Oral BID Danae Orleans, PA-C   100 mg at 10/18/17 2004  . fentaNYL (SUBLIMAZE) injection 50 mcg  50 mcg Intravenous Q1H PRN Danae Orleans, PA-C   50 mcg at 10/15/17 0810  . ferrous sulfate tablet 325 mg  325 mg Oral Daily Danae Orleans, PA-C   325 mg at 10/18/17 1041  . hydrALAZINE (APRESOLINE) injection 10 mg  10 mg Intravenous Q6H PRN Danae Orleans, PA-C   10 mg at 10/14/17 1453  . HYDROcodone-acetaminophen (NORCO) 7.5-325 MG per tablet 1-2 tablet  1-2 tablet Oral Q4H PRN Danae Orleans, PA-C   2 tablet at 10/18/17 0315  . HYDROcodone-acetaminophen (NORCO/VICODIN) 5-325 MG per tablet 1-2 tablet  1-2 tablet Oral Q4H PRN Danae Orleans, PA-C   2 tablet at 10/19/17 0626  . HYDROmorphone (DILAUDID) injection 0.25-0.5 mg  0.25-0.5 mg Intravenous Q5 min PRN Suzette Battiest, MD   0.5 mg at 10/16/17 2226  . hydrOXYzine (ATARAX/VISTARIL) tablet 10 mg  10 mg Oral QHS PRN Willia Craze, NP      . levothyroxine (SYNTHROID, LEVOTHROID) tablet 100 mcg  100 mcg Oral QAC breakfast Danae Orleans, PA-C   100 mcg at 10/19/17 9485  . lidocaine (XYLOCAINE) 0.5 % (with pres) injection 10 mL  10 mL Intradermal Once Babish, Matthew, PA-C      . losartan (COZAAR) tablet 100 mg  100 mg Oral Daily Danae Orleans, PA-C   100 mg at 10/18/17 1041  . magnesium citrate solution 1 Bottle  1 Bottle Oral Once PRN Danae Orleans, PA-C      . MEDLINE mouth rinse  15 mL Mouth Rinse BID Paralee Cancel, MD   15 mL at 10/18/17 2215  . menthol-cetylpyridinium (CEPACOL) lozenge 3 mg  1 lozenge Oral PRN Danae Orleans, PA-C       Or  . phenol (CHLORASEPTIC) mouth spray 1 spray  1 spray Mouth/Throat PRN Babish, Matthew, PA-C      . methocarbamol (ROBAXIN) tablet 500 mg  500 mg Oral  TID PRN Danae Orleans, PA-C   500 mg at 10/18/17 1432  . metoCLOPramide (REGLAN) tablet 5-10 mg  5-10 mg Oral Q8H PRN Danae Orleans, PA-C       Or  . metoCLOPramide (REGLAN) injection 5-10 mg  5-10 mg Intravenous Q8H PRN Danae Orleans, PA-C   10 mg at 10/18/17 1128  . morphine 2 MG/ML injection 0.5-1 mg  0.5-1 mg Intravenous Q2H PRN Danae Orleans, PA-C      . ondansetron [facility]) tablet 4 mg  4 mg Oral Q6H PRN Danae Orleans, PA-C       Or  . ondansetron (ZOFRAN) injection 4 mg  4 mg Intravenous Q6H PRN Danae Orleans,  PA-C   4 mg at 10/18/17 0827  . polyethylene glycol (MIRALAX / GLYCOLAX) packet 17 g  17 g Oral BID Danae Orleans, PA-C   17 g at 10/18/17 2003  . potassium chloride SA (K-DUR,KLOR-CON) CR tablet 40 mEq  40 mEq Oral Once Babish, Matthew, PA-C      . sodium chloride flush (NS) 0.9 % injection 10-40 mL  10-40 mL Intracatheter PRN Paralee Cancel, MD   10 mL at 10/19/17 0422  . traZODone (DESYREL) tablet 100 mg  100 mg Oral QHS PRN Danae Orleans, PA-C   100 mg at 10/18/17 2344  . verapamil (CALAN-SR) CR tablet 120 mg  120 mg Oral QHS Danae Orleans, PA-C   120 mg at 10/18/17 2003  . Warfarin - Pharmacist Dosing Inpatient   Does not apply q1800 Eudelia Bunch, [facility]         Discharge Medications: Please see discharge summary for a list of discharge medications.  Relevant Imaging Results:  Relevant Lab Results:   Additional Information [id number]  Lia Hopping, LCSW

## 2017-10-19 NOTE — Progress Notes (Signed)
OT Cancellation Note  Patient Details Name: Paula Ferguson MRN: [id number] DOB: [birth year]   Cancelled Treatment:    Reason Eval/Treat Not Completed: Other (comment). Pt did not want to get OOB earlier due to pain and then at test. Will check back next week.    Allah Reason 10/19/2017, 2:54 PM  Lesle Chris, OTR/L [phone] 10/19/2017

## 2017-10-19 NOTE — Plan of Care (Signed)
Plan of care discussed.   

## 2017-10-19 NOTE — Progress Notes (Signed)
[facility] for Infectious Disease    Date of Admission:  10/09/2017   Total days of antibiotics 11           ID: Paula Ferguson is a 77 y.o. 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 Had leukamoid reaction with wbc as high as 35K, and hyperbilirubinemia thought to be due to rifampin Active Problems:   Hypothyroidism   Hyperlipidemia   ANEMIA   Essential hypertension   Coronary atherosclerosis   S/P right knee replacement   Anticoagulation goal of INR 2.5 to 3.5   History of mechanical aortic valve replacement   Chronic pain   Knee pain, acute   Fall   Leukocytosis   Rhabdomyolysis   Dizziness   Lightheadedness   Generalized weakness   S/P total knee arthroplasty   Elevated bilirubin   Other ascites   Dilated pancreatic duct   MSSA bacteremia   Infection of prosthetic right knee joint (H[facility])    Subjective: Afebrile, slept well, swelling left arm improving. Getting scheduled for going to rehab  Leukocytosis improving and tbili decreasing  Medications:  . buPROPion  150 mg Oral Daily  . celecoxib  200 mg Oral BID  . docusate sodium  100 mg Oral BID  . ferrous sulfate  325 mg Oral Daily  . levothyroxine  100 mcg Oral QAC breakfast  . lidocaine  10 mL Intradermal Once  . losartan  100 mg Oral Daily  . mouth rinse  15 mL Mouth Rinse BID  . polyethylene glycol  17 g Oral BID  . potassium chloride  40 mEq Oral Once  . verapamil  120 mg Oral QHS  . Warfarin - Pharmacist Dosing Inpatient   Does not apply q1800    Objective: Vital signs in last 24 hours: Temp:  [97.6 F (36.4 C)-98 F (36.7 C)] 97.7 F (36.5 C) (08/30 0549) Pulse Rate:  [90-114] 90 (08/30 0549) Resp:  [14-16] 16 (08/30 0549) BP: (128-148)/(67-90) 128/67 (08/30 0549) SpO2:  [96 %-100 %] 96 % (08/30 0549) Weight:  [74.7 kg] 74.7 kg (08/30 0400) Physical Exam  Constitutional:   oriented to person, place, and time. appears well-developed and well-nourished. No distress.  HENT: /AT, PERRLA, no scleral icterus Mouth/Throat: Oropharynx is clear and moist. No oropharyngeal exudate.  Cardiovascular: iregular, irregular. Exam reveals no gallop and no friction rub.  Pulmonary/Chest: Effort normal and breath sounds normal. No respiratory distress.  has no wheezes.  Abdominal: Soft. Bowel sounds are normal.  exhibits no distension. There is no tenderness.  Ext: right arm swelling with dependent echymosis. Anasarca to lower extremities, wrap on right knee Lymphadenopathy: no cervical adenopathy. No axillary adenopathy Neurological: alert and oriented to person, place, and time.  Skin: Skin is warm and dry. No rash noted. No erythema.  Psychiatric: a normal mood and affect.  behavior is normal.      Lab Results Recent Labs    10/18/17 0435 10/19/17 0421  WBC 34.0* 24.9*  HGB 10.0* 10.0*  HCT 28.3* 28.6*  NA 134* 135  K 4.1 4.0  CL 109 111  CO2 21* 20*  BUN 45* 43*  CREATININE 1.00 0.88   Liver Panel Recent Labs    10/17/17 0453 10/18/17 0435 10/19/17 0421  PROT 4.5*  4.5* 5.1* 4.6*  ALBUMIN 1.4*  1.5* 1.6* 1.4*  AST 30  27 30  34  ALT 5  5 <5 <5  ALKPHOS 92  86 104 110  BILITOT 5.6*  5.8* 3.9* 2.8*  BILIDIR 3.5*  --   --   IBILI 2.1*  --   --    Sedimentation Rate No results for input(s): ESRSEDRATE in the last 72 hours. C-Reactive Protein Recent Labs    10/18/17 0435  CRP 13.7*    Microbiology: reviewed Studies/Results: Mr 3d Recon At Scanner  Result Date: 10/18/2017 CLINICAL DATA:  Elevated liver function tests. Pancreatic ductal dilatation seen on recent ultrasound. Previous cholecystectomy. EXAM: MRI ABDOMEN WITHOUT AND WITH CONTRAST (INCLUDING MRCP) TECHNIQUE: Multiplanar multisequence MR imaging of the abdomen was performed both before and after the administration of intravenous contrast. Heavily T2-weighted images of the biliary  and pancreatic ducts were obtained, and three-dimensional MRCP images were rendered by post processing. CONTRAST:  66m MULTIHANCE GADOBENATE DIMEGLUMINE 529 MG/ML IV SOLN COMPARISON:  Abdomen ultrasound on 10/16/2017 FINDINGS: Lower chest: Small bilateral pleural effusions and diffuse chest wall edema. Hepatobiliary: Hepatic cirrhosis is demonstrated. No liver masses are identified. Portal and hepatic veins remain patent. Previous cholecystectomy. Mild dilatation of the extrahepatic common bile duct is seen which measures 13 mm, however there is no significant dilatation of intrahepatic bile ducts. Suboptimal visualization due to respiratory motion artifact, however there is no definite evidence of choledocholithiasis. Pancreas: No mass or inflammatory changes. Other visualization is suboptimal due to respiratory motion artifact, the main pancreatic duct measures 4 mm. No mass or acute inflammatory changes identified. Spleen:  Within normal limits in size and appearance. Adrenals/Urinary Tract: No masses identified. No evidence of hydronephrosis. Stomach/Bowel: Visualized portion unremarkable. Vascular/Lymphatic: No pathologically enlarged lymph nodes identified. No abdominal aortic aneurysm. Other:  Diffuse mesenteric and body wall edema.  Mild ascites. Musculoskeletal:  No suspicious bone lesions identified. IMPRESSION: Prior cholecystectomy. Mild dilatation of common bile duct measuring 13 mm. No choledocholithiasis or other obstructing etiology visualized. Mild pancreatic ductal dilatation measuring 4 mm. No evidence of pancreatic mass or acute inflammatory changes. This may be secondary to chronic pancreatitis. Hepatic cirrhosis, mild ascites, and diffuse mesenteric and body wall edema. No evidence of hepatic neoplasm or splenomegaly. Electronically Signed   By: JEarle GellM.D.   On: 10/18/2017 14:39   Mr Abdomen Mrcp WMoise BoringContast  Result Date: 10/18/2017 CLINICAL DATA:  Elevated liver function tests.  Pancreatic ductal dilatation seen on recent ultrasound. Previous cholecystectomy. EXAM: MRI ABDOMEN WITHOUT AND WITH CONTRAST (INCLUDING MRCP) TECHNIQUE: Multiplanar multisequence MR imaging of the abdomen was performed both before and after the administration of intravenous contrast. Heavily T2-weighted images of the biliary and pancreatic ducts were obtained, and three-dimensional MRCP images were rendered by post processing. CONTRAST:  167mMULTIHANCE GADOBENATE DIMEGLUMINE 529 MG/ML IV SOLN COMPARISON:  Abdomen ultrasound on 10/16/2017 FINDINGS: Lower chest: Small bilateral pleural effusions and diffuse chest wall edema. Hepatobiliary: Hepatic cirrhosis is demonstrated. No liver masses are identified. Portal and hepatic veins remain patent. Previous cholecystectomy. Mild dilatation of the extrahepatic common bile duct is seen which measures 13 mm, however there is no significant dilatation of intrahepatic bile ducts. Suboptimal visualization due to respiratory motion artifact, however there is no definite evidence of choledocholithiasis. Pancreas: No mass or inflammatory changes. Other visualization is suboptimal due to respiratory motion artifact, the main pancreatic duct measures 4 mm. No mass or acute inflammatory changes identified. Spleen:  Within normal limits in size and appearance. Adrenals/Urinary Tract: No masses identified. No evidence of hydronephrosis. Stomach/Bowel: Visualized portion unremarkable. Vascular/Lymphatic: No pathologically enlarged lymph nodes identified.  No abdominal aortic aneurysm. Other:  Diffuse mesenteric and body wall edema.  Mild ascites. Musculoskeletal:  No suspicious bone lesions identified. IMPRESSION: Prior cholecystectomy. Mild dilatation of common bile duct measuring 13 mm. No choledocholithiasis or other obstructing etiology visualized. Mild pancreatic ductal dilatation measuring 4 mm. No evidence of pancreatic mass or acute inflammatory changes. This may be secondary  to chronic pancreatitis. Hepatic cirrhosis, mild ascites, and diffuse mesenteric and body wall edema. No evidence of hepatic neoplasm or splenomegaly. Electronically Signed   By: Earle Gell M.D.   On: 10/18/2017 14:39     Assessment/Plan:  MSSA pji = continue on cefazolin. Plan for 6 wk, then monitor off of abtx to see whether she is ready for reimplantation. Will place opat orders today. Home health orders listed below. Will arrange for follow up in 4 wk in ID clinic  Hyperbilirubinemia = continues to improve and normalize. Today at 2.8  Leukocytosis = thought to be leukamoid reaction. WBC trending down. Today at 25K. Recommend to check before discharge to see getting closer to her baseline  Afib? = she is on coumadin for her valve but has paroxysmal afib.recommend ekg to confirm  Will sign off.  ------------------------------- Diagnosis: pji  Culture Result: MSSA  Allergies  Allergen Reactions  . Hydromorphone Hcl Itching    "Dilaudid"  . Meperidine Hcl Itching  . Morphine Sulfate Itching  . Percocet [Oxycodone-Acetaminophen] Itching    OPAT Orders Discharge antibiotics: Per pharmacy protocol  cefazolin  Duration: 6 wk End Date: Oct 9th  [place] Per Protocol:  Labs weekly while on IV antibiotics: _x_ CBC with differential _x_ BMP _x_ CRP _x_ ESR  _x_ Please leave PIC in place until doctor has seen patient or been notified  Fax weekly labs to [phone]  Clinic Follow Up Appt: 4 wk with dr Jaymie Mckiddy  @   [facility] for Infectious Diseases Cell: [phone] Pager: [phone]  10/19/2017, 10:53 AM

## 2017-10-20 ENCOUNTER — Other Ambulatory Visit: Payer: Self-pay

## 2017-10-20 LAB — PROTIME-INR
INR: 3.11
PROTHROMBIN TIME: 31.8 s — AB (ref 11.4–15.2)

## 2017-10-20 LAB — COMPREHENSIVE METABOLIC PANEL
ALT: 5 U/L (ref 0–44)
AST: 49 U/L — ABNORMAL HIGH (ref 15–41)
Albumin: 1.4 g/dL — ABNORMAL LOW (ref 3.5–5.0)
Alkaline Phosphatase: 109 U/L (ref 38–126)
Anion gap: 6 (ref 5–15)
BUN: 43 mg/dL — ABNORMAL HIGH (ref 8–23)
CALCIUM: 7.7 mg/dL — AB (ref 8.9–10.3)
CO2: 22 mmol/L (ref 22–32)
CREATININE: 0.84 mg/dL (ref 0.44–1.00)
Chloride: 106 mmol/L (ref 98–111)
Glucose, Bld: 97 mg/dL (ref 70–99)
Potassium: 4 mmol/L (ref 3.5–5.1)
SODIUM: 134 mmol/L — AB (ref 135–145)
Total Bilirubin: 2.8 mg/dL — ABNORMAL HIGH (ref 0.3–1.2)
Total Protein: 4.9 g/dL — ABNORMAL LOW (ref 6.5–8.1)

## 2017-10-20 LAB — CBC WITH DIFFERENTIAL/PLATELET
BASOS PCT: 0 %
Basophils Absolute: 0 10*3/uL (ref 0.0–0.1)
EOS PCT: 0 %
Eosinophils Absolute: 0 10*3/uL (ref 0.0–0.7)
HCT: 30.6 % — ABNORMAL LOW (ref 36.0–46.0)
HEMOGLOBIN: 10.5 g/dL — AB (ref 12.0–15.0)
Lymphocytes Relative: 2 %
Lymphs Abs: 0.6 10*3/uL — ABNORMAL LOW (ref 0.7–4.0)
MCH: 32 pg (ref 26.0–34.0)
MCHC: 34.3 g/dL (ref 30.0–36.0)
MCV: 93.3 fL (ref 78.0–100.0)
METAMYELOCYTES PCT: 3 %
MYELOCYTES: 1 %
Monocytes Absolute: 1.2 10*3/uL — ABNORMAL HIGH (ref 0.1–1.0)
Monocytes Relative: 4 %
NEUTROS ABS: 27.5 10*3/uL — AB (ref 1.7–7.7)
Neutrophils Relative %: 90 %
Platelets: 260 10*3/uL (ref 150–400)
RBC: 3.28 MIL/uL — ABNORMAL LOW (ref 3.87–5.11)
RDW: 21.1 % — ABNORMAL HIGH (ref 11.5–15.5)
WBC: 29.3 10*3/uL — ABNORMAL HIGH (ref 4.0–10.5)

## 2017-10-20 LAB — GLUCOSE, CAPILLARY: Glucose-Capillary: 74 mg/dL (ref 70–99)

## 2017-10-20 LAB — MAGNESIUM: Magnesium: 1.9 mg/dL (ref 1.7–2.4)

## 2017-10-20 MED ORDER — ENSURE ENLIVE PO LIQD
237.0000 mL | Freq: Two times a day (BID) | ORAL | Status: DC
  Administered 2017-10-20: 237 mL via ORAL

## 2017-10-20 MED ORDER — FUROSEMIDE 10 MG/ML IJ SOLN
40.0000 mg | Freq: Two times a day (BID) | INTRAMUSCULAR | Status: DC
  Administered 2017-10-20 – 2017-10-21 (×2): 40 mg via INTRAVENOUS
  Filled 2017-10-20 (×2): qty 4

## 2017-10-20 MED ORDER — WARFARIN SODIUM 1 MG PO TABS
1.0000 mg | ORAL_TABLET | Freq: Once | ORAL | Status: AC
  Administered 2017-10-20: 1 mg via ORAL
  Filled 2017-10-20: qty 1

## 2017-10-20 MED ORDER — FUROSEMIDE 10 MG/ML IJ SOLN
60.0000 mg | Freq: Once | INTRAMUSCULAR | Status: AC
  Administered 2017-10-20: 60 mg via INTRAVENOUS
  Filled 2017-10-20: qty 6

## 2017-10-20 NOTE — Progress Notes (Signed)
Patient c/o chest pains at approximately 1944. Described pain as pressing and heavy feeling on her chest. Denies pain with inspiration or radiating to back , jaw, or arms. Patient does have a hx of CABG x1. EKG done and it showed right bundle branch block and possible lateral infarct, age undetermined. EKG on 8/20 showed no such result. RR Nurse was called to further evaluate patient and NP Blount was also informed of EKG findings. Patient had the same result based on EKG done in May this year as per NP Blount. Patient was give 1mg  of Morphine IV with good effect. Patient is now asleep and in no acute distress at this time. Patient currently receiving 2L of O2 as per her request. Will continue to monitor for any further episodes of chest pains.

## 2017-10-20 NOTE — Plan of Care (Signed)
Pt remains stable though concern for elevation of WBC and moderate edema remain. Lasix given again today per md order. Pt to stay overnight again for observation. Rn medicating for needs. Will continue to monitor.

## 2017-10-20 NOTE — Progress Notes (Signed)
Patient ID: VALLEY KE, female   DOB: 04/29/1940, 77 y.o.   MRN: [id number]  PROGRESS NOTE    Paula Ferguson  WGY:[id number] DOB: [birth year] DOA: 10/09/2017 PCP: Marletta Lor, MD   Brief Narrative:  77 year old female with history of CAD status post CABG and history of AVR, hypertension, hyperlipidemia, hypothyroidism, anemia, depression and other comorbidities presented with worsened knee pain after a fall. She was admitted with right severe knee pain along with mild rhabdomyolysis.  She was found to have MSSA bacteremia.  ID and orthopedics were consulted.  TEE was negative for vegetations.  She underwent Right total knee resection with antibiotic spacer placement on 10/16/2017 by orthopedics.  GI was consulted for elevated bilirubin.  Assessment & Plan:   Active Problems:   Hypothyroidism   Hyperlipidemia   ANEMIA   Essential hypertension   Coronary atherosclerosis   S/P right knee replacement   Anticoagulation goal of INR 2.5 to 3.5   History of mechanical aortic valve replacement   Chronic pain   Knee pain, acute   Fall   Leukocytosis   Rhabdomyolysis   Dizziness   Lightheadedness   Generalized weakness   S/P total knee arthroplasty   Elevated bilirubin   Other ascites   Dilated pancreatic duct   MSSA bacteremia   Infection of prosthetic right knee joint (HCC)   Hepatic cirrhosis (HCC)    MSSA bacteremia with leukocytosis probably from prosthetic right knee joint infection -ID following: Initially started on Ancef and rifampin but rifampin was discontinued on 10/16/2017 by ID.  Continue Ancef for a total of 6 weeks till 11/28/2017.  Outpatient follow-up with ID. -Repeat blood cultures on 10/11/2017 show no growth to date -status post TEE, no vegetations noted -leukocytosis has worsened today.  Will repeat CBC in a.m. and if WBC improves, will consider discharge. -Currently not spiking temperatures  Prosthetic right knee joint infection -Status post  right total knee resection and antibiotic spacer placement on 10/16/2017 by orthopedics.  Follow further orthopedics recommendations.  Wound care as per orthopedics recommendations -PT/OT eval.  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  Continue Coumadin. -Left upper extremity duplex ultrasound is negative for DVT  Hypoalbuminemia with fluid overload -Follow nutrition recommendations -Still volume overloaded with considerable lower extremity 2+ edema: We will empirically use intravenous Lasix.  Strict input and output and daily weights   Acute rhabdomyolysis -Resolved.  Coronary artery disease with history of CABG and AVR -Currently chest pain-free -Continue losartan, verapamil -Continue Coumadin.  INR is therapeutic.  Statin held because of mild rhabdomyolysis.    Supratherapeutic INR -Resolved.  Currently INR is therapeutic.  Continue Coumadin for INR goal to be between 2.5-3.5  Essential hypertension -Continue losartan, verapamil.  Blood pressure controlled  Hyperlipidemia -Statin held due to rhabdomyolysis   Depression -Continue bupropion  Acute blood loss anemia -Probably secondary to perioperative blood loss.  Status post 2 unit packed red cells transfusion on 10/17/2017.  Hemoglobin 10.5 today.  Monitor CBC.  Hypothyroidism -Continue Synthroid  Hypokalemia -Resolved with replacement  Elevated bilirubin -?rifampin -GI has signed off.  MRCP showing mild CBD and pancreatic duct dilatation.  Bilirubin same as yesterday at 2.8.  Repeat a.m. LFTs.  Outpatient follow-up with GI  Generalized deconditioning -PT/OT recommends SNF.  Probable discharge to SNF tomorrow if patient remains stable  DVT prophylaxis: Coumadin Code Status: Full Family Communication: None at bedside Disposition  Plan: SNF tomorrow if patient  remains stable  Consultants: Orthopedic/ID/GI  Procedures:Right total knee resection with antibiotic spacer placement on 10/16/2017 by orthopedics  Antimicrobials: Ancef from 10/10/2017 onwards Rifampin 10/11/2017-10/17/2017  Subjective: Patient seen and examined at bedside.  She denies any overnight worsening shortness of breath cough, fever.  Feels weak. objective: Vitals:   10/19/17 0400 10/19/17 0549 10/19/17 1400 10/20/17 0425  BP:  128/67 125/70 (!) 144/74  Pulse:  90 100 91  Resp:  16 14 16   Temp:  97.7 F (36.5 C) 97.9 F (36.6 C) 98.4 F (36.9 C)  TempSrc:  Oral Oral   SpO2:  96% 99% 100%  Weight: 74.7 kg   78.7 kg  Height: 5\' 2"  (1.575 m)       Intake/Output Summary (Last 24 hours) at 10/20/2017 1057 Last data filed at 10/20/2017 1000 Gross per 24 hour  Intake 1980 ml  Output 2100 ml  Net -120 ml   Filed Weights   10/17/17 0500 10/19/17 0400 10/20/17 0425  Weight: 71.7 kg 74.7 kg 78.7 kg    Examination:  General exam: No acute distress Respiratory system: Bilateral decreased breath sounds at bases, with scattered crackles Cardiovascular system: S1-S2 heard, rate controlled Gastrointestinal system: Abdomen is nondistended, soft and nontender. Normal bowel sounds heard. Extremities:  right knee dressing present.  Bilateral lower extremity pitting edema along with left upper extremity pitting edema   Data Reviewed: I have personally reviewed following labs and imaging studies  CBC: Recent Labs  Lab 10/15/17 0409 10/16/17 0517 10/17/17 0453 10/18/17 0435 10/19/17 0421 10/20/17 0440  WBC 24.2* 25.9* 36.1* 34.0* 24.9* 29.3*  NEUTROABS 20.1*  --  32.7  --  22.0* 27.5*  HGB 8.5* 8.3* 7.2* 10.0* 10.0* 10.5*  HCT 24.7* 24.0* 21.7* 28.3* 28.6* 30.6*  MCV 93.2 93.4 97.3 88.7 91.1 93.3  PLT 177 188 195 217 220 127   Basic Metabolic Panel: Recent Labs  Lab 10/13/17 1311  10/16/17 0517 10/17/17 0453 10/18/17 0435 10/19/17 0421 10/20/17 0440  NA  --    <  > 134* 135 134* 135 134*  K  --    < > 3.4* 4.2 4.1 4.0 4.0  CL  --    < > 108 110 109 111 106  CO2  --    < > 20* 21* 21* 20* 22  GLUCOSE  --    < > 69* 100* 78 84 97  BUN  --    < > 32* 39* 45* 43* 43*  CREATININE  --    < > 0.75 0.98 1.00 0.88 0.84  CALCIUM  --    < > 7.7* 7.4* 7.7* 7.7* 7.7*  MG 1.8  --   --   --  2.1 2.1 1.9   < > = values in this interval not displayed.   GFR: Estimated Creatinine Clearance: 55.3 mL/min (by C-G formula based on SCr of 0.84 mg/dL). Liver Function Tests: Recent Labs  Lab 10/16/17 0517 10/17/17 0453 10/18/17 0435 10/19/17 0421 10/20/17 0440  AST 27 30  27 30  34 49*  ALT 6 5  5  <5 <5 5  ALKPHOS 79 92  86 104 110 109  BILITOT 6.0* 5.6*  5.8* 3.9* 2.8* 2.8*  PROT 4.8* 4.5*  4.5* 5.1* 4.6* 4.9*  ALBUMIN 1.7* 1.4*  1.5* 1.6* 1.4* 1.4*   No results for input(s): LIPASE, AMYLASE in the last 168 hours. No results for input(s): AMMONIA in the last 168 hours. Coagulation Profile: Recent Labs  Lab 10/16/17 0722 10/17/17 0453 10/18/17 0435 10/19/17 0421 10/20/17 0440  INR 1.44 1.60 2.02 2.88 3.11   Cardiac Enzymes: Recent Labs  Lab 10/13/17 1311  CKTOTAL 70   BNP (last 3 results) No results for input(s): PROBNP in the last 8760 hours. HbA1C: No results for input(s): HGBA1C in the last 72 hours. CBG: Recent Labs  Lab 10/17/17 0729 10/18/17 0747 10/18/17 1351 10/19/17 1704 10/20/17 0728  GLUCAP 85 77 109* 145* 74   Lipid Profile: No results for input(s): CHOL, HDL, LDLCALC, TRIG, CHOLHDL, LDLDIRECT in the last 72 hours. Thyroid Function Tests: No results for input(s): TSH, T4TOTAL, FREET4, T3FREE, THYROIDAB in the last 72 hours. Anemia Panel: No results for input(s): VITAMINB12, FOLATE, FERRITIN, TIBC, IRON, RETICCTPCT in the last 72 hours. Sepsis Labs: No results for input(s): PROCALCITON, LATICACIDVEN in the last 168 hours.  Recent Results (from the past 240 hour(s))  Aerobic/Anaerobic Culture (surgical/deep wound)      Status: None   Collection Time: 10/10/17  5:45 PM  Result Value Ref Range Status   Specimen Description   Final    SYNOVIAL Performed at [facility] [address] [place], [place] [address]    Special Requests   Final    NONE Performed at [facility], [place] [address] [place], [place] [address]    Gram Stain   Final    ABUNDANT WBC PRESENT, PREDOMINANTLY PMN ABUNDANT GRAM POSITIVE COCCI IN PAIRS IN CHAINS IN CLUSTERS Gram Stain Report Called to,Read Back By and Verified With: S.WRIGHT AT 1929 ON 10/10/17 BY N.THOMPSON Performed at [facility], [place] [address], [place], [place] [address]    Culture   Final    MODERATE STAPHYLOCOCCUS AUREUS NO ANAEROBES ISOLATED Performed at [facility], [place] [address] [place], [place] [address]    Report Status 10/15/2017 FINAL  Final   Organism ID, Bacteria STAPHYLOCOCCUS AUREUS  Final      Susceptibility   Staphylococcus aureus - MIC*    CIPROFLOXACIN <=0.5 SENSITIVE Sensitive     ERYTHROMYCIN >=8 RESISTANT Resistant     GENTAMICIN <=0.5 SENSITIVE Sensitive     OXACILLIN 0.5 SENSITIVE Sensitive     TETRACYCLINE <=1 SENSITIVE Sensitive     VANCOMYCIN 1 SENSITIVE Sensitive     TRIMETH/SULFA <=10 SENSITIVE Sensitive     CLINDAMYCIN <=0.25 SENSITIVE Sensitive     RIFAMPIN <=0.5 SENSITIVE Sensitive     Inducible Clindamycin NEGATIVE Sensitive     * MODERATE STAPHYLOCOCCUS AUREUS  Culture, blood (routine x 2)     Status: None   Collection Time: 10/11/17  1:09 PM  Result Value Ref Range Status   Specimen Description   Final    BLOOD RIGHT ANTECUBITAL Performed at [facility] [address], [place], [place] [address]    Special Requests   Final    BOTTLES DRAWN AEROBIC AND ANAEROBIC Blood Culture adequate volume Performed at [facility] [address] [place], [place] [address]    Culture   Final    NO GROWTH 5 DAYS Performed at  [facility], [place] [address] [place], [place] [address]    Report Status 10/16/2017 FINAL  Final  Culture, blood (routine x 2)     Status: None   Collection Time: 10/11/17  1:13 PM  Result Value Ref Range Status   Specimen Description   Final    BLOOD BLOOD RIGHT FOREARM Performed at [facility], [address] [place], [place] [address]    Special Requests   Final    BOTTLES DRAWN AEROBIC AND ANAEROBIC Blood Culture adequate volume Performed at [facility] [address], [place], [place] [address]    Culture   Final    NO GROWTH 5 DAYS Performed at [facility], [place] [address], [place],  [address]    Report Status 10/16/2017 FINAL  Final         Radiology Studies: Dg Chest Port 1 View  Result Date: 10/19/2017 CLINICAL DATA:  Shortness of breath. EXAM: PORTABLE CHEST 1 VIEW COMPARISON:  Radiograph of October 14, 2017. FINDINGS: Stable cardiomediastinal silhouette. Status post cardiac valve repair. Interval placement of right-sided PICC line with distal tip in expected position of the SVC. No pneumothorax or pleural effusion is noted. Right lung is clear. Stable mild left basilar subsegmental atelectasis or scarring is noted. Status post bilateral shoulder arthroplasties. IMPRESSION: Interval placement of right-sided PICC line with distal tip in expected position of the SVC. Stable mild left basilar subsegmental atelectasis or scarring. Electronically Signed   By: Marijo Conception, M.D.   On: 10/19/2017 12:37        Scheduled Meds: . buPROPion  150 mg Oral Daily  . celecoxib  200 mg Oral BID  . docusate sodium  100 mg Oral BID  . ferrous sulfate  325 mg Oral Daily  . levothyroxine  100 mcg Oral QAC breakfast  . lidocaine  10 mL Intradermal Once  . losartan  100 mg Oral Daily  . mouth rinse  15 mL Mouth Rinse BID  . polyethylene glycol  17 g Oral BID  . potassium chloride  40 mEq Oral Once  . verapamil  120 mg  Oral QHS  . Warfarin - Pharmacist Dosing Inpatient   Does not apply q1800   Continuous Infusions: . sodium chloride 30 mL/hr at 10/18/17 1830  .  ceFAZolin (ANCEF) IV Stopped (10/20/17 0403)     LOS: 10 days        Aline August, MD [facility] Pager [phone]  If 7PM-7AM, please contact night-coverage [email or web address] Password [facility] 10/20/2017, 10:57 AM

## 2017-10-20 NOTE — Progress Notes (Signed)
ANTICOAGULATION CONSULT NOTE  Pharmacy Consult for warfarin Indication: Aortic mechanical valve   Allergies  Allergen Reactions  . Hydromorphone Hcl Itching    "Dilaudid"  . Meperidine Hcl Itching  . Morphine Sulfate Itching  . Percocet [Oxycodone-Acetaminophen] Itching    Patient Measurements: Height: 5\' 2"  (157.5 cm) Weight: 173 lb 9.6 oz (78.7 kg) IBW/kg (Calculated) : 50.1  Vital Signs: Temp: 98.4 F (36.9 C) (08/31 0425) BP: 144/74 (08/31 0425) Pulse Rate: 91 (08/31 0425)  Labs: Recent Labs    10/18/17 0435 10/19/17 0421 10/20/17 0440  HGB 10.0* 10.0* 10.5*  HCT 28.3* 28.6* 30.6*  PLT 217 220 260  LABPROT 22.7* 29.9* 31.8*  INR 2.02 2.88 3.11  CREATININE 1.00 0.88 0.84   Estimated Creatinine Clearance: 55.3 mL/min (by C-G formula based on SCr of 0.84 mg/dL).  Assessment: Paula Ferguson with PMH of mechanical aortic vale replacement on chronic warfarin anticoagulation.  Admitted on 8/20 s/p fall with rhabdo, and with PMH of prosthetic TKR; found to have MSSA bacteremia and prosthetic joint infection. Patient on heparin perioperatively, transitioned back to warfarin on 8/27.   Baseline INR subtherapeutic  Prior anticoagulation: warfarin 2.5 mg daily except 5 mg on Mon & Fri, LD 8/20  Significant events:  Heparin gtt 8/21 - 8/26, avoiding warfarin d/t rifampin interaction  8/26: Vit K 1 mg IV   8/27: TKA with spacer placement; GI workup requested for hyperbilirubinemia; rifampin stopped as patient now with spacer and could be worsening tbili, resume warfarin  8/28: PRBC x 2 d/t postop ABLA  8/29 Enoxaparin 40 mg subQ daily discontinued as INR therapeutic  Today, 10/20/2017:  CBC: Hgb improved stable s/p transfusion on 8/28, Plt stable WNL  INR now slightly supratherapeutic and rising after inpatient doses of warfarin of 5mg , 5mg , 2.5mg  and 0mg   Confirmed INR goal of 2-3 with patient and daughter. Daughter reports that INR was elevated ~4 recently PTA.  Pt seems to be sensitive to warfarin this admission - would recommend decreasing dose on discharge.  Major drug interactions: none major, on celecoxib   No bleeding issues per nursing  Eating 50% of meals  Goal of Therapy: INR 2-3 (per [facility] clinic notes, confirmed with patient and daughter)  Plan:  Will given low dose of warfarin of 1mg  this PM despite elevated INR in effort to prevent SUBtherapeutic INR in a few days after no warfarin dosing given yesterday  Patient seems highly sensitive to warfarin postop despite vitamin K; dosing conservatively now that INR >2.0. Would recommend discharging pt on lower dose than she was taking PTA.  Daily INR  Monitor for signs of bleeding or thrombosis   Adrian Saran, PharmD, BCPS Pager [phone] 10/20/2017 11:22 AM

## 2017-10-20 NOTE — Progress Notes (Signed)
Physical Therapy Treatment Patient Details Name: Paula Ferguson MRN: [id number] DOB: 12/30/1940 Today's Date: 10/20/2017    History of Present Illness 77 y.o. female with medical history significant of CAD s/p CABG x1 and Hx of AVR, HTN, HLD, Hypothyroidism, Anemia, Depression presents  10/09/17 with worsened Knee pain and a fall. Dx of septic TKA. s/p resection and antibiotic spacer    PT Comments    Patient received in bed, adamantly declines activity to EOB or OOB due to pain and stomach cramps/nausea but agreeable to starting with exercises and going from there. Focused session on total joint exercises, patient very weak and requires Mod cues/ModA for all exercises today. Attempted again to convince patient to participate in EOB/OOB activity however she continues to adamantly decline. She was left in bed with all needs met, bed alarm active, nurse tech present. If able and IF time and schedule allow, PT may attempt back again today for EOB/OOB mobility, otherwise will plan to progress on next date of service.    Follow Up Recommendations  SNF;Home health PT     Equipment Recommendations  Wheelchair (measurements PT);Wheelchair cushion (measurements PT)    Recommendations for Other Services       Precautions / Restrictions Precautions Precautions: Knee;Fall Precaution Comments: KI At all times unless clarified, HR up 147 Required Braces or Orthoses: Knee Immobilizer - Right Knee Immobilizer - Right: On at all times Restrictions Weight Bearing Restrictions: Yes RLE Weight Bearing: Partial weight bearing RLE Partial Weight Bearing Percentage or Pounds: 50%    Mobility  Bed Mobility               General bed mobility comments: patient declined due to stomach pain and discomfort   Transfers                 General transfer comment: patient declined/refused   Ambulation/Gait             General Gait Details: patient declined/refused    Stairs              Wheelchair Mobility    Modified Rankin (Stroke Patients Only)       Balance                                            Cognition Arousal/Alertness: Awake/alert Behavior During Therapy: WFL for tasks assessed/performed Overall Cognitive Status: Within Functional Limits for tasks assessed                                        Exercises Total Joint Exercises Ankle Circles/Pumps: Both;20 reps;Supine Quad Sets: Right;10 reps;Supine Gluteal Sets: Both;10 reps;Supine Short Arc Quad: Right;10 reps;Supine Heel Slides: 10 reps;Right;Supine Hip ABduction/ADduction: Right;10 reps;Supine Straight Leg Raises: 10 reps;Right;Supine    General Comments        Pertinent Vitals/Pain Pain Assessment: 0-10 Pain Score: 7  Pain Location: right knee, stomach  Pain Descriptors / Indicators: Discomfort;Grimacing;Guarding;Aching;Sore Pain Intervention(s): Limited activity within patient's tolerance;Monitored during session    Home Living                      Prior Function            PT Goals (current goals can now be found in the care plan section)  Acute Rehab PT Goals Patient Stated Goal: to go home PT Goal Formulation: With patient Time For Goal Achievement: 10/31/17 Potential to Achieve Goals: Fair Progress towards PT goals: Not progressing toward goals - comment(very weak, limited session on 8/31 )    Frequency    Min 6X/week      PT Plan Current plan remains appropriate    Co-evaluation              AM-PAC PT "6 Clicks" Daily Activity  Outcome Measure  Difficulty turning over in bed (including adjusting bedclothes, sheets and blankets)?: Unable Difficulty moving from lying on back to sitting on the side of the bed? : Unable Difficulty sitting down on and standing up from a chair with arms (e.g., wheelchair, bedside commode, etc,.)?: Unable Help needed moving to and from a bed to chair (including a  wheelchair)?: Total Help needed walking in hospital room?: Total Help needed climbing 3-5 steps with a railing? : Total 6 Click Score: 6    End of Session   Activity Tolerance: Patient tolerated treatment well Patient left: in bed;with call bell/phone within reach;with bed alarm set;with nursing/sitter in room   PT Visit Diagnosis: Unsteadiness on feet (R26.81);History of falling (Z91.81)     Time: 3354-5625 PT Time Calculation (min) (ACUTE ONLY): 20 min  Charges:  $Therapeutic Exercise: 8-22 mins                     Deniece Ree PT, DPT, CBIS  Supplemental Physical Therapist [place]   Pager [phone]

## 2017-10-20 NOTE — Significant Event (Addendum)
Rapid Response Event Note Called to evaluate a pt that c/o cp 7/10.   Overview:  Pt came to hospital with worsened knee pain and post a fall. Pt has multiple other comorbidities as well.       Initial Focused Assessment:  Upon entering pt room she was found to be lying bed w/o any signs of distress.  She said her pain was now better.  Pt nurse had already gotten an EKG and spoke with pt MD of results.  Pt said her knee actually hurts worst than her chest and she felt like her pain was from her previous fall.  Pt denied any pain radiating down her arm, denied any jaw pain or pressure in her chest.  Pt denies and does not seems to SOB.   Interventions:  Vitals HR 102, B/P 150/68, Sats 98% on RA, 13 R/R, and Temp 98.9 Pt received morphine IVP per her RN.  Pt then rated her pain 3/10.  States I feel much better.  O2 placed at @ [facility] d/t pt request.  Pt pulled up in bed and repositioned. Ending VSS, Pt HR 97, Sats 98, RR 13, B/P 158/69.  Explained to PT and RN that I am available if they have more issues.  Pt and RN concurred.  Plan of Care (if not transferred): Pt will remain on [place] for now.  Event Summary: arrived at 2110 and ended at 2038   at      at          Dyann Ruddle

## 2017-10-21 DIAGNOSIS — R188 Other ascites: Secondary | ICD-10-CM | POA: Diagnosis not present

## 2017-10-21 DIAGNOSIS — R17 Unspecified jaundice: Secondary | ICD-10-CM | POA: Diagnosis not present

## 2017-10-21 DIAGNOSIS — E162 Hypoglycemia, unspecified: Secondary | ICD-10-CM | POA: Diagnosis not present

## 2017-10-21 DIAGNOSIS — I10 Essential (primary) hypertension: Secondary | ICD-10-CM | POA: Diagnosis not present

## 2017-10-21 DIAGNOSIS — M25561 Pain in right knee: Secondary | ICD-10-CM | POA: Diagnosis not present

## 2017-10-21 DIAGNOSIS — Z96651 Presence of right artificial knee joint: Secondary | ICD-10-CM

## 2017-10-21 DIAGNOSIS — E785 Hyperlipidemia, unspecified: Secondary | ICD-10-CM | POA: Diagnosis not present

## 2017-10-21 DIAGNOSIS — T8453XA Infection and inflammatory reaction due to internal right knee prosthesis, initial encounter: Secondary | ICD-10-CM | POA: Diagnosis not present

## 2017-10-21 DIAGNOSIS — D649 Anemia, unspecified: Secondary | ICD-10-CM | POA: Diagnosis not present

## 2017-10-21 DIAGNOSIS — M009 Pyogenic arthritis, unspecified: Secondary | ICD-10-CM | POA: Diagnosis not present

## 2017-10-21 DIAGNOSIS — R4020 Unspecified coma: Secondary | ICD-10-CM | POA: Diagnosis not present

## 2017-10-21 DIAGNOSIS — I2510 Atherosclerotic heart disease of native coronary artery without angina pectoris: Secondary | ICD-10-CM | POA: Diagnosis not present

## 2017-10-21 DIAGNOSIS — R2681 Unsteadiness on feet: Secondary | ICD-10-CM | POA: Diagnosis not present

## 2017-10-21 DIAGNOSIS — Z4789 Encounter for other orthopedic aftercare: Secondary | ICD-10-CM | POA: Diagnosis not present

## 2017-10-21 DIAGNOSIS — M6282 Rhabdomyolysis: Secondary | ICD-10-CM | POA: Diagnosis not present

## 2017-10-21 DIAGNOSIS — R404 Transient alteration of awareness: Secondary | ICD-10-CM | POA: Diagnosis not present

## 2017-10-21 DIAGNOSIS — G8929 Other chronic pain: Secondary | ICD-10-CM | POA: Diagnosis not present

## 2017-10-21 DIAGNOSIS — M2550 Pain in unspecified joint: Secondary | ICD-10-CM | POA: Diagnosis not present

## 2017-10-21 DIAGNOSIS — Z7901 Long term (current) use of anticoagulants: Secondary | ICD-10-CM | POA: Diagnosis not present

## 2017-10-21 DIAGNOSIS — Z952 Presence of prosthetic heart valve: Secondary | ICD-10-CM | POA: Diagnosis not present

## 2017-10-21 DIAGNOSIS — K7460 Unspecified cirrhosis of liver: Secondary | ICD-10-CM | POA: Diagnosis not present

## 2017-10-21 DIAGNOSIS — R278 Other lack of coordination: Secondary | ICD-10-CM | POA: Diagnosis not present

## 2017-10-21 DIAGNOSIS — E039 Hypothyroidism, unspecified: Secondary | ICD-10-CM | POA: Diagnosis not present

## 2017-10-21 DIAGNOSIS — T8453XD Infection and inflammatory reaction due to internal right knee prosthesis, subsequent encounter: Secondary | ICD-10-CM | POA: Diagnosis not present

## 2017-10-21 DIAGNOSIS — R531 Weakness: Secondary | ICD-10-CM | POA: Diagnosis not present

## 2017-10-21 DIAGNOSIS — K8689 Other specified diseases of pancreas: Secondary | ICD-10-CM | POA: Diagnosis not present

## 2017-10-21 DIAGNOSIS — R42 Dizziness and giddiness: Secondary | ICD-10-CM | POA: Diagnosis not present

## 2017-10-21 DIAGNOSIS — W19XXXD Unspecified fall, subsequent encounter: Secondary | ICD-10-CM | POA: Diagnosis not present

## 2017-10-21 DIAGNOSIS — R7881 Bacteremia: Secondary | ICD-10-CM | POA: Diagnosis not present

## 2017-10-21 DIAGNOSIS — Z7401 Bed confinement status: Secondary | ICD-10-CM | POA: Diagnosis not present

## 2017-10-21 DIAGNOSIS — E161 Other hypoglycemia: Secondary | ICD-10-CM | POA: Diagnosis not present

## 2017-10-21 DIAGNOSIS — D72829 Elevated white blood cell count, unspecified: Secondary | ICD-10-CM | POA: Diagnosis not present

## 2017-10-21 DIAGNOSIS — Z471 Aftercare following joint replacement surgery: Secondary | ICD-10-CM | POA: Diagnosis not present

## 2017-10-21 LAB — COMPREHENSIVE METABOLIC PANEL
ALT: 6 U/L (ref 0–44)
ANION GAP: 8 (ref 5–15)
AST: 47 U/L — ABNORMAL HIGH (ref 15–41)
Albumin: 1.4 g/dL — ABNORMAL LOW (ref 3.5–5.0)
Alkaline Phosphatase: 119 U/L (ref 38–126)
BUN: 44 mg/dL — AB (ref 8–23)
CHLORIDE: 107 mmol/L (ref 98–111)
CO2: 21 mmol/L — ABNORMAL LOW (ref 22–32)
Calcium: 7.8 mg/dL — ABNORMAL LOW (ref 8.9–10.3)
Creatinine, Ser: 0.91 mg/dL (ref 0.44–1.00)
GFR, EST NON AFRICAN AMERICAN: 60 mL/min — AB (ref >60)
Glucose, Bld: 61 mg/dL — ABNORMAL LOW (ref 70–99)
POTASSIUM: 4.1 mmol/L (ref 3.5–5.1)
Sodium: 136 mmol/L (ref 135–145)
Total Bilirubin: 2.3 mg/dL — ABNORMAL HIGH (ref 0.3–1.2)
Total Protein: 5.1 g/dL — ABNORMAL LOW (ref 6.5–8.1)

## 2017-10-21 LAB — CBC WITH DIFFERENTIAL/PLATELET
BASOS PCT: 0 %
Basophils Absolute: 0 10*3/uL (ref 0.0–0.1)
EOS ABS: 0 10*3/uL (ref 0.0–0.7)
Eosinophils Relative: 0 %
HCT: 30.4 % — ABNORMAL LOW (ref 36.0–46.0)
Hemoglobin: 10.3 g/dL — ABNORMAL LOW (ref 12.0–15.0)
Lymphocytes Relative: 3 %
Lymphs Abs: 0.9 10*3/uL (ref 0.7–4.0)
MCH: 32.2 pg (ref 26.0–34.0)
MCHC: 33.9 g/dL (ref 30.0–36.0)
MCV: 95 fL (ref 78.0–100.0)
MONO ABS: 2 10*3/uL — AB (ref 0.1–1.0)
Monocytes Relative: 7 %
Neutro Abs: 26 10*3/uL — ABNORMAL HIGH (ref 1.7–7.7)
Neutrophils Relative %: 90 %
PLATELETS: 281 10*3/uL (ref 150–400)
RBC: 3.2 MIL/uL — ABNORMAL LOW (ref 3.87–5.11)
RDW: 21.4 % — AB (ref 11.5–15.5)
WBC Morphology: INCREASED
WBC: 28.9 10*3/uL — AB (ref 4.0–10.5)

## 2017-10-21 LAB — GLUCOSE, CAPILLARY: GLUCOSE-CAPILLARY: 71 mg/dL (ref 70–99)

## 2017-10-21 LAB — MAGNESIUM: Magnesium: 1.9 mg/dL (ref 1.7–2.4)

## 2017-10-21 LAB — PROTIME-INR
INR: 2.99
PROTHROMBIN TIME: 30.9 s — AB (ref 11.4–15.2)

## 2017-10-21 MED ORDER — FUROSEMIDE 40 MG PO TABS: 40.0000 mg | ORAL_TABLET | Freq: Two times a day (BID) | ORAL | 0 refills | Status: AC

## 2017-10-21 MED ORDER — ENSURE ENLIVE PO LIQD: 237.0000 mL | Freq: Two times a day (BID) | ORAL | 12 refills | Status: AC

## 2017-10-21 MED ORDER — MAGIC MOUTHWASH: 5.0000 mL | Freq: Four times a day (QID) | ORAL | 0 refills | Status: AC | PRN

## 2017-10-21 MED ORDER — SENNA 8.6 MG PO TABS: 1.0000 | ORAL_TABLET | Freq: Two times a day (BID) | ORAL | 0 refills | Status: AC

## 2017-10-21 MED ORDER — POLYETHYLENE GLYCOL 3350 17 G PO PACK: 17.0000 g | PACK | Freq: Every day | ORAL | 0 refills | Status: AC

## 2017-10-21 MED ORDER — HEPARIN SOD (PORK) LOCK FLUSH 100 UNIT/ML IV SOLN
250.0000 [IU] | INTRAVENOUS | Status: AC | PRN
  Administered 2017-10-21: 250 [IU]

## 2017-10-21 MED ORDER — LEVOTHYROXINE SODIUM 100 MCG PO TABS: 100.0000 ug | ORAL_TABLET | Freq: Every day | ORAL | Status: AC

## 2017-10-21 MED ORDER — WARFARIN SODIUM 2 MG PO TABS: 2.0000 mg | ORAL_TABLET | Freq: Every day | ORAL | 0 refills | Status: AC

## 2017-10-21 MED ORDER — CEFAZOLIN IV (FOR PTA / DISCHARGE USE ONLY): 2.0000 g | Freq: Three times a day (TID) | INTRAVENOUS | 0 refills | Status: AC

## 2017-10-21 MED ORDER — HYDROCODONE-ACETAMINOPHEN 7.5-325 MG PO TABS: 1.0000 | ORAL_TABLET | Freq: Four times a day (QID) | ORAL | 0 refills | Status: AC | PRN

## 2017-10-21 MED ORDER — CELECOXIB 200 MG PO CAPS: 200.0000 mg | ORAL_CAPSULE | Freq: Two times a day (BID) | ORAL | 0 refills | Status: AC

## 2017-10-21 NOTE — Progress Notes (Signed)
Pt discharged from floor via stretcher for transport to [facility] by ambulance. Report phoned to April @ [facility] for pt's nurse, Stanton Kidney. No changes in assessment. [name], [employer]

## 2017-10-21 NOTE — Progress Notes (Signed)
Patient discharging to [facility] today.  CSW confirmed bed at facility and faxed appropriate documents.   [facility] has been called for transport.   RN called report to: [phone]    No more CSW needs, signing off.     Pricilla Holm, MSW, [place] [facility] [phone]

## 2017-10-21 NOTE — Discharge Summary (Signed)
Physician Discharge Summary  JACQUELINNE SPEAK QQV:[id number] DOB: 08-07-1940 DOA: 10/09/2017  PCP: Marletta Lor, MD  Admit date: 10/09/2017 Discharge date: 10/21/2017  Admitted From: Home Disposition: SNF  Recommendations for Outpatient Follow-up:  1. Follow up with provider at SNF at earliest convenience with repeat CBC/BMP/INR 2. Follow-up with orthopedics as an outpatient.  Wound care as per orthopedics recommendations 3. Follow-up with gastroenterology as an outpatient 4. Follow-up with infectious diseases as an outpatient 5. Fall precautions 6. Activity as per orthopedic/PT recommendations   Home Health: No Equipment/Devices: None Discharge Condition: Guarded CODE STATUS: Full Diet recommendation: Heart Healthy  Brief/Interim Summary: 77 year old female with history of CAD status post CABG and history of AVR, hypertension, hyperlipidemia, hypothyroidism, anemia, depression and other comorbidities presented with worsened knee pain after a fall. She was admitted with right severe knee pain along with mild rhabdomyolysis.  She was found to have MSSA bacteremia.  ID and orthopedics were consulted.  TEE was negative for vegetations.  She underwent Right total knee resection with antibiotic spacer placement on 10/16/2017 by orthopedics.  GI was consulted for elevated bilirubin.  Her bilirubin is improving.  She was found to have cirrhosis of liver and mild CBD and pancreatic duct dilatation for which she needs to follow-up with GI as an outpatient. Repeat cultures have been negative so far.  She will be discharged on 6 weeks of IV Ancef as per ID recommendations.  She still has persistent leukocytosis which can be followed up as an outpatient. She was also found to have bilateral age indeterminate thrombosis of the lower extremities, she is already on Coumadin. She is extremely hypoalbuminemic and fluid overloaded and is currently getting Lasix which will be changed to oral.  She  needs to focus on her appetite and diet.    Discharge Diagnoses:  Active Problems:   Hypothyroidism   Hyperlipidemia   ANEMIA   Essential hypertension   Coronary atherosclerosis   S/P right knee replacement   Anticoagulation goal of INR 2.5 to 3.5   History of mechanical aortic valve replacement   Chronic pain   Knee pain, acute   Fall   Leukocytosis   Rhabdomyolysis   Dizziness   Lightheadedness   Generalized weakness   S/P total knee arthroplasty   Elevated bilirubin   Other ascites   Dilated pancreatic duct   MSSA bacteremia   Infection of prosthetic right knee joint (HCC)   Hepatic cirrhosis (HCC)  MSSA bacteremia with leukocytosis probably from prosthetic right knee joint infection -ID following: Initially started on Ancef and rifampin but rifampin was discontinued on 10/16/2017 by ID.  Continue Ancef for a total of 6 weeks till 11/28/2017.  Outpatient follow-up with ID. -Repeat blood cultures on 10/11/2017 show no growth to date -status post TEE, no vegetations noted -Still has persistent leukocytosis: Will need to be followed up as an outpatient -Currently not spiking temperatures  Prosthetic right knee joint infection -Status post right total knee resection and antibiotic spacer placement on 10/16/2017 by orthopedics.  Follow further orthopedics recommendations.  Outpatient follow-up with orthopedics. Wound care as per orthopedics recommendations -Continue PT eval. 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  Continue Coumadin. -Left upper extremity duplex ultrasound is negative for DVT  Hypoalbuminemia with fluid overload -Follow nutrition recommendations -Still volume overloaded with considerable lower extremity 2+ edema: Currently on empiric intravenous Lasix.  Switch to oral Lasix twice  a day for now and reevaluate  in the nursing home  Acute rhabdomyolysis -Resolved.  Coronary artery disease with history of CABG and AVR -Currently chest pain-free -Continue losartan, verapamil -Continue Coumadin.  INR is therapeutic.    Restart statin on discharge  Supratherapeutic INR -Resolved.  Currently INR is therapeutic.  Continue Coumadin for INR goal to be between 2-3 which has been confirmed with the patient and family members.  Will discharge on low-dose of Coumadin 2 mg daily with repeat INR at the nursing home at earliest convenience  Essential hypertension -Continue losartan, verapamil.  Blood pressure controlled  Hyperlipidemia -Statin will be resumed on discharge  Depression -Continue bupropion  Acute blood loss anemia -Probably secondary to perioperative blood loss.  Status post 2 unit packed red cells transfusion on 10/17/2017.  Hemoglobin 10.3 today.  Monitor CBC as an outpatient.  Hypothyroidism -Continue Synthroid  Hypokalemia -Resolved with replacement  Elevated bilirubin with new diagnosis of cirrhosis of liver -?rifampin -GI has signed off.  MRCP showing mild CBD and pancreatic duct dilatation with cirrhosis of liver.  Bilirubin same as yesterday at 2.3.  Outpatient follow-up with GI  Generalized deconditioning -PT/OT recommends SNF.  Probable discharge to SNF today if bed is available  Discharge Instructions  Discharge Instructions    Ambulatory referral to Infectious Disease   Complete by:  As directed    Call MD for:  difficulty breathing, headache or visual disturbances   Complete by:  As directed    Call MD for:  extreme fatigue   Complete by:  As directed    Call MD for:  hives   Complete by:  As directed    Call MD for:  persistant dizziness or light-headedness   Complete by:  As directed    Call MD for:  persistant nausea and vomiting   Complete by:  As directed    Call MD for:  redness, tenderness, or signs of infection (pain, swelling, redness, odor  or green/yellow discharge around incision site)   Complete by:  As directed    Call MD for:  severe uncontrolled pain   Complete by:  As directed    Call MD for:  temperature >100.4   Complete by:  As directed    Diet - low sodium heart healthy   Complete by:  As directed    Discharge instructions   Complete by:  As directed    Fall precautions Activity as per Orthopedics/PT recommendations Dressing as per Orthopedics   Home infusion instructions [facility] May follow [place] Dosing Protocol; May administer Cathflo as needed to maintain patency of vascular access device.; Flushing of vascular access device: per [facility] Protocol: 0.9% NaCl pre/post medica...   Complete by:  As directed    Instructions:  May follow [place] Dosing Protocol   Instructions:  May administer Cathflo as needed to maintain patency of vascular access device.   Instructions:  Flushing of vascular access device: per [facility] Protocol: 0.9% NaCl pre/post medication administration and prn patency; Heparin 100 u/ml, 10m for implanted ports and Heparin 10u/ml, 562mfor all other central venous catheters.   Instructions:  May follow [facility] Anaphylaxis Protocol for First Dose Administration in the home: 0.9% NaCl at 25-50 ml/hr to maintain IV access for protocol meds. Epinephrine 0.3 ml IV/IM PRN and Benadryl 25-50 IV/IM PRN s/s of anaphylaxis.   Instructions:  [facility] Coordinator (RN) to assist per patient IV care needs in the home PRN.   Increase  activity slowly   Complete by:  As directed      Allergies as of 10/21/2017      Reactions   Hydromorphone Hcl Itching   "Dilaudid"   Meperidine Hcl Itching   Morphine Sulfate Itching   Percocet [oxycodone-acetaminophen] Itching      Medication List    STOP taking these medications   amoxicillin 500 MG capsule Commonly known as:  AMOXIL     TAKE these medications   atorvastatin 20 MG tablet Commonly known as:  LIPITOR Take 1 tablet (20 mg  total) by mouth daily.   BIOFREEZE ROLL-ON EX Apply 1 application topically as needed (Applies to knees for pain.).   BIOTIN PO Take 1 tablet by mouth daily.   buPROPion 150 MG 12 hr tablet Commonly known as:  WELLBUTRIN SR TAKE 1 TABLET BY MOUTH DAILY.   ceFAZolin  IVPB Commonly known as:  ANCEF Inject 2 g into the vein every 8 (eight) hours. Indication:  Prosthetic joint infection Last Day of Therapy:  October 9th 2019 Labs - Once weekly:  CBC/D and BMP, Labs - Every other week:  ESR and CRP   celecoxib 200 MG capsule Commonly known as:  CELEBREX Take 1 capsule (200 mg total) by mouth 2 (two) times daily.   diclofenac sodium 1 % Gel Commonly known as:  VOLTAREN Apply 2 g topically as needed (For pain.).   feeding supplement (ENSURE ENLIVE) Liqd Take 237 mLs by mouth 2 (two) times daily between meals.   ferrous sulfate 325 (65 FE) MG tablet Take 325 mg by mouth daily.   furosemide 40 MG tablet Commonly known as:  LASIX Take 1 tablet (40 mg total) by mouth 2 (two) times daily for 10 days.   HYDROcodone-acetaminophen 7.5-325 MG tablet Commonly known as:  NORCO Take 1 tablet by mouth every 6 (six) hours as needed for moderate pain.   levothyroxine 100 MCG tablet Commonly known as:  SYNTHROID, LEVOTHROID Take 1 tablet (100 mcg total) by mouth daily before breakfast.   losartan 100 MG tablet Commonly known as:  COZAAR TAKE 1 TABLET BY MOUTH DAILY.   magic mouthwash Soln Take 5 mLs by mouth 4 (four) times daily as needed for mouth pain.   methocarbamol 500 MG tablet Commonly known as:  ROBAXIN Take 1 tablet (500 mg total) by mouth 3 (three) times daily as needed. What changed:  reasons to take this   polyethylene glycol packet Commonly known as:  MIRALAX / GLYCOLAX Take 17 g by mouth daily.   senna 8.6 MG Tabs tablet Commonly known as:  SENOKOT Take 1 tablet (8.6 mg total) by mouth 2 (two) times daily.   traZODone 100 MG tablet Commonly known as:   DESYREL TAKE 1 TABLET BY MOUTH AT BEDTIME AS NEEDED FOR SLEEP. What changed:    how much to take  reasons to take this  additional instructions   verapamil 240 MG CR tablet Commonly known as:  CALAN-SR TAKE 1/2 TABLET BY MOUTH AT BEDTIME.   warfarin 2 MG tablet Commonly known as:  COUMADIN Take as directed. If you are unsure how to take this medication, talk to your nurse or doctor. Original instructions:  Take 1 tablet (2 mg total) by mouth daily for 5 days. What changed:    medication strength  how much to take  when to take this            Home Infusion Instuctions  (From admission, onward)  Start     Ordered   10/21/17 0000  Home infusion instructions [facility] May follow [place] Dosing Protocol; May administer Cathflo as needed to maintain patency of vascular access device.; Flushing of vascular access device: per [facility] Protocol: 0.9% NaCl pre/post medica...    Question Answer Comment  Instructions May follow [place] Dosing Protocol   Instructions May administer Cathflo as needed to maintain patency of vascular access device.   Instructions Flushing of vascular access device: per [facility] Protocol: 0.9% NaCl pre/post medication administration and prn patency; Heparin 100 u/ml, 38m for implanted ports and Heparin 10u/ml, 566mfor all other central venous catheters.   Instructions May follow [facility] Anaphylaxis Protocol for First Dose Administration in the home: 0.9% NaCl at 25-50 ml/hr to maintain IV access for protocol meds. Epinephrine 0.3 ml IV/IM PRN and Benadryl 25-50 IV/IM PRN s/s of anaphylaxis.   Instructions [facility] Infusion Coordinator (RN) to assist per patient IV care needs in the home PRN.      10/21/17 1141          Contact information for follow-up providers    OlParalee CancelMD. Schedule an appointment as soon as possible for a visit in 2 weeks.   Specialty:  Orthopedic Surgery Contact information: [phone]      GuWillia CrazeNP Follow up on 11/14/2017.   Specialty:  Gastroenterology Why:  at 1:30pm. Please arrive 10 minutes earlier Contact information: [phone]        SnCarlyle BasquesMD. Schedule an appointment as soon as possible for a visit in 2 week(s).   Specialty:  Infectious Diseases Contact information: [address] [phone]          Contact information for after-discharge care    Destination    [facility] Preferred SNF Follow up.   Service:  Skilled Nursing Why:  With repeat CBC/BMP/INR at earliest convenience Contact information: [phone]               Allergies  Allergen Reactions  . Hydromorphone Hcl Itching    "Dilaudid"  . Meperidine Hcl Itching  . Morphine Sulfate Itching  . Percocet [Oxycodone-Acetaminophen] Itching    Consultations: Orthopedic/ID/GI   Procedures/Studies: Dg Chest 1 View  Result Date: 10/09/2017 CLINICAL DATA:  Status post fall.  Right hip pain EXAM: CHEST  1 VIEW COMPARISON:  None. FINDINGS: The heart size and mediastinal contours are within normal limits. Prior CABG. Both lungs are clear. No acute osseous abnormality. Bilateral total reverse shoulder arthroplasties. IMPRESSION: No active disease. Electronically Signed   By: HeKathreen Devoid On: 10/09/2017 10:08   Ct Knee Right W Contrast  Result Date: 10/09/2017 CLINICAL DATA:  Status post fall August 2019 from the couch. Right knee pain. Unable to get up. EXAM: CT OF THE RIGHT KNEE WITH CONTRAST TECHNIQUE: Multidetector CT imaging was performed following the standard protocol during bolus administration of intravenous contrast. COMPARISON:  None. FINDINGS: Bones/Joint/Cartilage Right total knee arthroplasty. Thin lucency around the tibial stem  component of the arthroplasty measuring less than 1 mm concerning for loosening. No osteolysis. No periarticular fluid collection. No fracture or dislocation. Normal alignment. Large joint effusion with synovial thickening. Ligaments Ligaments are suboptimally evaluated by CT. Muscles and Tendons Muscles are normal. No muscle  atrophy. Intact quadriceps tendon and patellar tendon. Soft tissue No fluid collection or hematoma.  No soft tissue mass. IMPRESSION: 1. No acute osseous injury of the right knee. Right total knee arthroplasty with a thin lucency around the tibial stem component of the arthroplasty measuring less than 1 mm concerning for loosening. Large right knee joint effusion with synovial thickening. Electronically Signed   By: Kathreen Devoid   On: 10/09/2017 14:28   Nm Bone Scan 3 Phase  Result Date: 10/11/2017 CLINICAL DATA:  Mechanical loosening of joint replacement, RIGHT knee pain since knee replacement surgery in 2014, fell Sunday EXAM: NUCLEAR MEDICINE 3-PHASE BONE SCAN TECHNIQUE: Radionuclide angiographic images, immediate static blood pool images, and 3-hour delayed static images were obtained of the knees after intravenous injection of radiopharmaceutical. RADIOPHARMACEUTICALS:  21.9 mCi Tc-78mMDP IV COMPARISON:  04/06/2017 Radiographic correlation: 10/09/2017 radiographs, CT RIGHT knee 10/09/2017 FINDINGS: Vascular phase: Increased blood flow to RIGHT knee. Normal blood flow LEFT knee. Blood pool phase: Increased blood pool diffusely about RIGHT knee versus LEFT, especially at the medial distal femoral metaphysis and slightly at the tibial plateau greater laterally. Delayed phase: Photopenic defect at RIGHT knee from knee prosthesis. Focal increased tracer accumulation at the RIGHT lateral tibial plateau suspicious for aseptic loosening of the prosthesis, though infection not excluded. No other focal abnormal increased tracer uptake adjacent to prosthetic components of the RIGHT knee. On the  LEFT, photopenic defect is seen at the medial compartment question unicompartmental prosthesis. Mild degenerative type uptake at the lateral compartment LEFT knee. IMPRESSION: Increased blood flow, blood pool, and delayed uptake of tracer at the RIGHT knee, with delayed uptake most focal at the RIGHT lateral tibial plateau adjacent to the prosthesis concerning for aseptic loosening or less likely infection of the prosthesis. Degenerative type uptake of tracer at the lateral compartment LEFT knee with evidence of prior medial compartment partial joint replacement. Electronically Signed   By: MLavonia DanaM.D.   On: 10/11/2017 13:16   Mr 3d Recon At Scanner  Result Date: 10/18/2017 CLINICAL DATA:  Elevated liver function tests. Pancreatic ductal dilatation seen on recent ultrasound. Previous cholecystectomy. EXAM: MRI ABDOMEN WITHOUT AND WITH CONTRAST (INCLUDING MRCP) TECHNIQUE: Multiplanar multisequence MR imaging of the abdomen was performed both before and after the administration of intravenous contrast. Heavily T2-weighted images of the biliary and pancreatic ducts were obtained, and three-dimensional MRCP images were rendered by post processing. CONTRAST:  139mMULTIHANCE GADOBENATE DIMEGLUMINE 529 MG/ML IV SOLN COMPARISON:  Abdomen ultrasound on 10/16/2017 FINDINGS: Lower chest: Small bilateral pleural effusions and diffuse chest wall edema. Hepatobiliary: Hepatic cirrhosis is demonstrated. No liver masses are identified. Portal and hepatic veins remain patent. Previous cholecystectomy. Mild dilatation of the extrahepatic common bile duct is seen which measures 13 mm, however there is no significant dilatation of intrahepatic bile ducts. Suboptimal visualization due to respiratory motion artifact, however there is no definite evidence of choledocholithiasis. Pancreas: No mass or inflammatory changes. Other visualization is suboptimal due to respiratory motion artifact, the main pancreatic duct measures 4 mm.  No mass or acute inflammatory changes identified. Spleen:  Within normal limits in size and appearance. Adrenals/Urinary Tract: No masses identified. No evidence of hydronephrosis. Stomach/Bowel: Visualized portion unremarkable. Vascular/Lymphatic: No pathologically enlarged lymph nodes identified. No abdominal aortic aneurysm. Other:  Diffuse mesenteric and body wall edema.  Mild ascites. Musculoskeletal:  No suspicious bone lesions identified. IMPRESSION: Prior cholecystectomy. Mild dilatation of common bile duct measuring 13 mm. No choledocholithiasis or other obstructing etiology visualized. Mild  pancreatic ductal dilatation measuring 4 mm. No evidence of pancreatic mass or acute inflammatory changes. This may be secondary to chronic pancreatitis. Hepatic cirrhosis, mild ascites, and diffuse mesenteric and body wall edema. No evidence of hepatic neoplasm or splenomegaly. Electronically Signed   By: Earle Gell M.D.   On: 10/18/2017 14:39   Dg Chest Port 1 View  Result Date: 10/19/2017 CLINICAL DATA:  Shortness of breath. EXAM: PORTABLE CHEST 1 VIEW COMPARISON:  Radiograph of October 14, 2017. FINDINGS: Stable cardiomediastinal silhouette. Status post cardiac valve repair. Interval placement of right-sided PICC line with distal tip in expected position of the SVC. No pneumothorax or pleural effusion is noted. Right lung is clear. Stable mild left basilar subsegmental atelectasis or scarring is noted. Status post bilateral shoulder arthroplasties. IMPRESSION: Interval placement of right-sided PICC line with distal tip in expected position of the SVC. Stable mild left basilar subsegmental atelectasis or scarring. Electronically Signed   By: Marijo Conception, M.D.   On: 10/19/2017 12:37   Dg Chest Port 1 View  Result Date: 10/14/2017 CLINICAL DATA:  Lightheadedness, dizziness, weakness, worsening symptoms over the last few months. EXAM: PORTABLE CHEST 1 VIEW COMPARISON:  Chest x-rays dated 10/09/2017 and  03/29/2017. FINDINGS: Heart size and mediastinal contours are stable. Median sternotomy wires appear intact and stable in alignment. Cardiac valve hardware appears stable in position. Probable mild atelectasis at the LEFT lung base. Blunting of the costophrenic angles bilaterally, likely chronic pleural thickening. Lungs otherwise clear. No acute or suspicious osseous finding. IMPRESSION: No active disease.  No evidence of pneumonia or pulmonary edema. Electronically Signed   By: Franki Cabot M.D.   On: 10/14/2017 16:38   Dg Knee Complete 4 Views Right  Result Date: 10/09/2017 CLINICAL DATA:  Pain following fall EXAM: RIGHT KNEE - COMPLETE 4+ VIEW COMPARISON:  None. FINDINGS: Frontal, lateral common bile oblique views were obtained. The patient is status post total knee replacement with femoral and tibial prosthetic components appearing well-seated. There is no acute fracture or dislocation. No appreciable joint effusion evident. No erosive change. There are foci of calcification in the popliteal artery. IMPRESSION: Status post total knee replacement with prosthetic components well-seated. No fracture or dislocation. No appreciable joint effusion. Popliteal artery atherosclerosis noted. Electronically Signed   By: Lowella Grip III M.D.   On: 10/09/2017 10:13   Mr Abdomen Mrcp Moise Boring Contast  Result Date: 10/18/2017 CLINICAL DATA:  Elevated liver function tests. Pancreatic ductal dilatation seen on recent ultrasound. Previous cholecystectomy. EXAM: MRI ABDOMEN WITHOUT AND WITH CONTRAST (INCLUDING MRCP) TECHNIQUE: Multiplanar multisequence MR imaging of the abdomen was performed both before and after the administration of intravenous contrast. Heavily T2-weighted images of the biliary and pancreatic ducts were obtained, and three-dimensional MRCP images were rendered by post processing. CONTRAST:  22m MULTIHANCE GADOBENATE DIMEGLUMINE 529 MG/ML IV SOLN COMPARISON:  Abdomen ultrasound on 10/16/2017  FINDINGS: Lower chest: Small bilateral pleural effusions and diffuse chest wall edema. Hepatobiliary: Hepatic cirrhosis is demonstrated. No liver masses are identified. Portal and hepatic veins remain patent. Previous cholecystectomy. Mild dilatation of the extrahepatic common bile duct is seen which measures 13 mm, however there is no significant dilatation of intrahepatic bile ducts. Suboptimal visualization due to respiratory motion artifact, however there is no definite evidence of choledocholithiasis. Pancreas: No mass or inflammatory changes. Other visualization is suboptimal due to respiratory motion artifact, the main pancreatic duct measures 4 mm. No mass or acute inflammatory changes identified. Spleen:  Within normal limits in size and  appearance. Adrenals/Urinary Tract: No masses identified. No evidence of hydronephrosis. Stomach/Bowel: Visualized portion unremarkable. Vascular/Lymphatic: No pathologically enlarged lymph nodes identified. No abdominal aortic aneurysm. Other:  Diffuse mesenteric and body wall edema.  Mild ascites. Musculoskeletal:  No suspicious bone lesions identified. IMPRESSION: Prior cholecystectomy. Mild dilatation of common bile duct measuring 13 mm. No choledocholithiasis or other obstructing etiology visualized. Mild pancreatic ductal dilatation measuring 4 mm. No evidence of pancreatic mass or acute inflammatory changes. This may be secondary to chronic pancreatitis. Hepatic cirrhosis, mild ascites, and diffuse mesenteric and body wall edema. No evidence of hepatic neoplasm or splenomegaly. Electronically Signed   By: Earle Gell M.D.   On: 10/18/2017 14:39   Dg Hip Unilat With Pelvis 2-3 Views Right  Result Date: 10/10/2017 CLINICAL DATA:  Chronic right hip pain.  No known injury. EXAM: DG HIP (WITH OR WITHOUT PELVIS) 2-3V RIGHT COMPARISON:  Radiographs 10/09/2017.  Left hip CT 09/18/2009. FINDINGS: The bones are demineralized. No evidence of acute fracture, dislocation or  femoral head avascular necrosis. Mild sacroiliac degenerative changes are present bilaterally. Suspected postsurgical changes at the right iliac crest from previous bone donor site. 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 IMPRESSION: No change from yesterday, and no acute osseous findings demonstrated. The bones are demineralized. If further evaluation warranted, MRI typically more useful in evaluation of atraumatic hip pain. Electronically Signed   By: Richardean Sale M.D.   On: 10/10/2017 11:23   Dg Hip Unilat With Pelvis 2-3 Views Right  Result Date: 10/09/2017 CLINICAL DATA:  Pain following fall EXAM: DG HIP (WITH OR WITHOUT PELVIS) 2-3V RIGHT COMPARISON:  None. FINDINGS: Frontal pelvis as well as frontal and lateral right hip images were obtained. No fracture or dislocation is evident. There is mild symmetric narrowing of both hip joints. There are exostoses arising from each lateral iliac crest with pseudoarthrosis in the area of exostosis on the left laterally. IMPRESSION: No evident acute fracture or dislocation. Mild symmetric narrowing both hip joints. Exostoses arising from each iliac crest with pseudoarthrosis in this area on the left. Electronically Signed   By: Lowella Grip III M.D.   On: 10/09/2017 10:11   [place] Ekg Site Rite  Result Date: 10/16/2017 If Site Rite image not attached, placement could not be confirmed due to current cardiac rhythm.  US Abdomen Limited Ruq  Result Date: 10/16/2017 CLINICAL DATA:  Elevated bilirubin EXAM: ULTRASOUND ABDOMEN LIMITED RIGHT UPPER QUADRANT COMPARISON:  None. FINDINGS: Gallbladder: Surgically absent Common bile duct: Diameter: 9.4 mm, upper normal for post cholecystectomy Liver: Nodular liver contour consistent with cirrhosis. No focal liver lesion. Mild ascites around the liver. Small right pleural effusion. Portal vein is patent on color Doppler imaging with normal direction of blood flow towards  the liver. Limited imaging of the pancreas feels no mass lesion mild prominence of the pancreatic duct measuring 3.5 mm. IMPRESSION: Common bile duct 9.4 mm, upper normal for post cholecystectomy Nodular liver contour consistent with cirrhosis.  Mild ascites Small right pleural effusion Mild prominence pancreatic duct measuring 3.5 mm distally. Electronically Signed   By: Franchot Gallo M.D.   On: 10/16/2017 09:54   Subjective: Patient seen and examined at bedside.  She denies any overnight fever, nausea, vomiting, worsening pain.  Feels weak and tired.  Discharge Exam: Vitals:   10/20/17 2115 10/21/17 0535  BP: (!) 160/78 126/66  Pulse: 96 94  Resp: 13 14  Temp: 97.6 F (36.4 C) 98 F (36.7 C)  SpO2: 100% 100%   Vitals:   10/20/17 1345 10/20/17 1945 10/20/17 2115 10/21/17 0535  BP: 118/65 (!) 147/70 (!) 160/78 126/66  Pulse: 98 95 96 94  Resp: 16  13 14   Temp: 97.7 F (36.5 C) 98.9 F (37.2 C) 97.6 F (36.4 C) 98 F (36.7 C)  TempSrc:  Oral    SpO2: 97% 98% 100% 100%  Weight:    79.1 kg  Height:        General: Pt is alert, awake, not in acute distress. Cardiovascular: rate controlled, S1/S2 + Respiratory: bilateral decreased breath sounds at bases with some scattered crackles Abdominal: Soft, NT, ND, bowel sounds + Extremities: right knee dressing present.  Bilateral lower extremity pitting edema along with left upper extremity pitting edema    The results of significant diagnostics from this hospitalization (including imaging, microbiology, ancillary and laboratory) are listed below for reference.     Microbiology: Recent Results (from the past 240 hour(s))  Culture, blood (routine x 2)     Status: None   Collection Time: 10/11/17  1:09 PM  Result Value Ref Range Status   Specimen Description   Final    BLOOD RIGHT ANTECUBITAL Performed at [facility] [address], [place], [place] [address]    Special Requests   Final    BOTTLES DRAWN  AEROBIC AND ANAEROBIC Blood Culture adequate volume Performed at [facility] [address], [place], [place] [address]    Culture   Final    NO GROWTH 5 DAYS Performed at [facility], [place] [address] [place], [place] [address]    Report Status 10/16/2017 FINAL  Final  Culture, blood (routine x 2)     Status: None   Collection Time: 10/11/17  1:13 PM  Result Value Ref Range Status   Specimen Description   Final    BLOOD BLOOD RIGHT FOREARM Performed at [facility] [address], [place], [place] [address]    Special Requests   Final    BOTTLES DRAWN AEROBIC AND ANAEROBIC Blood Culture adequate volume Performed at [facility] [address] [place], [place] [address]    Culture   Final    NO GROWTH 5 DAYS Performed at [facility], [place] [address], [place], [place] [address]    Report Status 10/16/2017 FINAL  Final     Labs: BNP (last 3 results) No results for input(s): BNP in the last 8760 hours. Basic Metabolic Panel: Recent Labs  Lab 10/17/17 0453 10/18/17 0435 10/19/17 0421 10/20/17 0440 10/21/17 0323  NA 135 134* 135 134* 136  K 4.2 4.1 4.0 4.0 4.1  CL 110 109 111 106 107  CO2 21* 21* 20* 22 21*  GLUCOSE 100* 78 84 97 61*  BUN 39* 45* 43* 43* 44*  CREATININE 0.98 1.00 0.88 0.84 0.91  CALCIUM 7.4* 7.7* 7.7* 7.7* 7.8*  MG  --  2.1 2.1 1.9 1.9   Liver Function Tests: Recent Labs  Lab 10/17/17 0453 10/18/17 0435 10/19/17 0421 10/20/17 0440 10/21/17 0323  AST 30  27 30  34 49* 47*  ALT 5  5 <5 <5 5 6   ALKPHOS 92  86 104 110 109 119  BILITOT 5.6*  5.8* 3.9* 2.8* 2.8* 2.3*  PROT 4.5*  4.5* 5.1* 4.6* 4.9* 5.1*  ALBUMIN 1.4*  1.5* 1.6* 1.4* 1.4* 1.4*   No results for input(s): LIPASE, AMYLASE in the last 168 hours. No results for  input(s): AMMONIA in the last 168 hours. CBC: Recent Labs  Lab 10/15/17 0409  10/17/17 0453 10/18/17 0435 10/19/17 0421 10/20/17 0440  10/21/17 0323  WBC 24.2*   < > 36.1* 34.0* 24.9* 29.3* 28.9*  NEUTROABS 20.1*  --  32.7  --  22.0* 27.5* 26.0*  HGB 8.5*   < > 7.2* 10.0* 10.0* 10.5* 10.3*  HCT 24.7*   < > 21.7* 28.3* 28.6* 30.6* 30.4*  MCV 93.2   < > 97.3 88.7 91.1 93.3 95.0  PLT 177   < > 195 217 220 260 281   < > = values in this interval not displayed.   Cardiac Enzymes: No results for input(s): CKTOTAL, CKMB, CKMBINDEX, TROPONINI in the last 168 hours. BNP: Invalid input(s): POCBNP CBG: Recent Labs  Lab 10/18/17 0747 10/18/17 1351 10/19/17 1704 10/20/17 0728 10/21/17 0737  GLUCAP 77 109* 145* 74 71   D-Dimer No results for input(s): DDIMER in the last 72 hours. Hgb A1c No results for input(s): HGBA1C in the last 72 hours. Lipid Profile No results for input(s): CHOL, HDL, LDLCALC, TRIG, CHOLHDL, LDLDIRECT in the last 72 hours. Thyroid function studies No results for input(s): TSH, T4TOTAL, T3FREE, THYROIDAB in the last 72 hours.  Invalid input(s): FREET3 Anemia work up No results for input(s): VITAMINB12, FOLATE, FERRITIN, TIBC, IRON, RETICCTPCT in the last 72 hours. Urinalysis    Component Value Date/Time   COLORURINE AMBER (A) 10/10/2017 0218   APPEARANCEUR CLEAR 10/10/2017 0218   LABSPEC 1.027 10/10/2017 0218   PHURINE 5.0 10/10/2017 0218   GLUCOSEU NEGATIVE 10/10/2017 0218   HGBUR SMALL (A) 10/10/2017 0218   HGBUR negative 04/09/2008 1252   BILIRUBINUR NEGATIVE 10/10/2017 0218   BILIRUBINUR n 03/01/2017 1544   KETONESUR NEGATIVE 10/10/2017 0218   PROTEINUR NEGATIVE 10/10/2017 0218   UROBILINOGEN 1.0 03/01/2017 1544   UROBILINOGEN 1.0 03/14/2011 1520   NITRITE NEGATIVE 10/10/2017 0218   LEUKOCYTESUR NEGATIVE 10/10/2017 0218   Sepsis Labs Invalid input(s): PROCALCITONIN,  WBC,  LACTICIDVEN Microbiology Recent Results (from the past 240 hour(s))  Culture, blood (routine x 2)     Status: None   Collection Time: 10/11/17  1:09 PM  Result Value Ref Range Status   Specimen Description    Final    BLOOD RIGHT ANTECUBITAL Performed at [facility], [place] [address] [place], [place] [address]    Special Requests   Final    BOTTLES DRAWN AEROBIC AND ANAEROBIC Blood Culture adequate volume Performed at [facility] [address], [place], [place] [address]    Culture   Final    NO GROWTH 5 DAYS Performed at [facility], [place] [address], [place], [place] [address]    Report Status 10/16/2017 FINAL  Final  Culture, blood (routine x 2)     Status: None   Collection Time: 10/11/17  1:13 PM  Result Value Ref Range Status   Specimen Description   Final    BLOOD BLOOD RIGHT FOREARM Performed at [facility] [address] [place], [place] [address]    Special Requests   Final    BOTTLES DRAWN AEROBIC AND ANAEROBIC Blood Culture adequate volume Performed at [facility] [address], [place], [place] [address]    Culture   Final    NO GROWTH 5 DAYS Performed at [facility], [place] [address] [place],  [address]    Report Status 10/16/2017 FINAL  Final     Time  coordinating discharge: 35 minutes  SIGNED:   Aline August, MD  [facility] 10/21/2017, 11:59 AM Pager: [phone]  If 7PM-7AM, please contact night-coverage [email or web address] Password [email or web address]

## 2017-10-21 NOTE — Progress Notes (Signed)
Pt PT Cancellation Note  Patient Details Name: Paula Ferguson MRN: [id number] DOB: [birth year]   Cancelled Treatment:     PT attempted this am - pt refusing c/o pain and fatigue and stating "I am leaving for the nursing home today".  Will follow.   Johanny Segers 10/21/2017, 12:17 PM

## 2017-10-21 NOTE — Progress Notes (Signed)
OT Cancellation Note  Patient Details Name: Paula Ferguson MRN: [id number] DOB: [birth year]   Cancelled Treatment:    Reason Eval/Treat Not Completed: Patient declined, no reason specified Pt reports leaving for SNF today, but became tearful and adamently unwilling to participate in OT this date. Will continue to follow.  Zenovia Jarred, MSOT, OTR/L   [place] 10/21/2017, 1:01 PM

## 2017-10-25 ENCOUNTER — Encounter: Payer: Medicare Other | Admitting: Oncology

## 2017-11-06 ENCOUNTER — Ambulatory Visit: Payer: Medicare Other | Admitting: Interventional Cardiology

## 2017-11-14 ENCOUNTER — Ambulatory Visit: Payer: Medicare Other | Admitting: Nurse Practitioner

## 2017-11-20 DEATH — deceased

## 2019-10-23 IMAGING — CR DG HIP (WITH OR WITHOUT PELVIS) 2-3V*R*
4 series · 4 of 4 positions shown · non-contrast
Comparison: None.

CLINICAL DATA: Pain following fall

EXAM:
DG HIP (WITH OR WITHOUT PELVIS) 2-3V RIGHT

[x pelvis (1 of 2)]
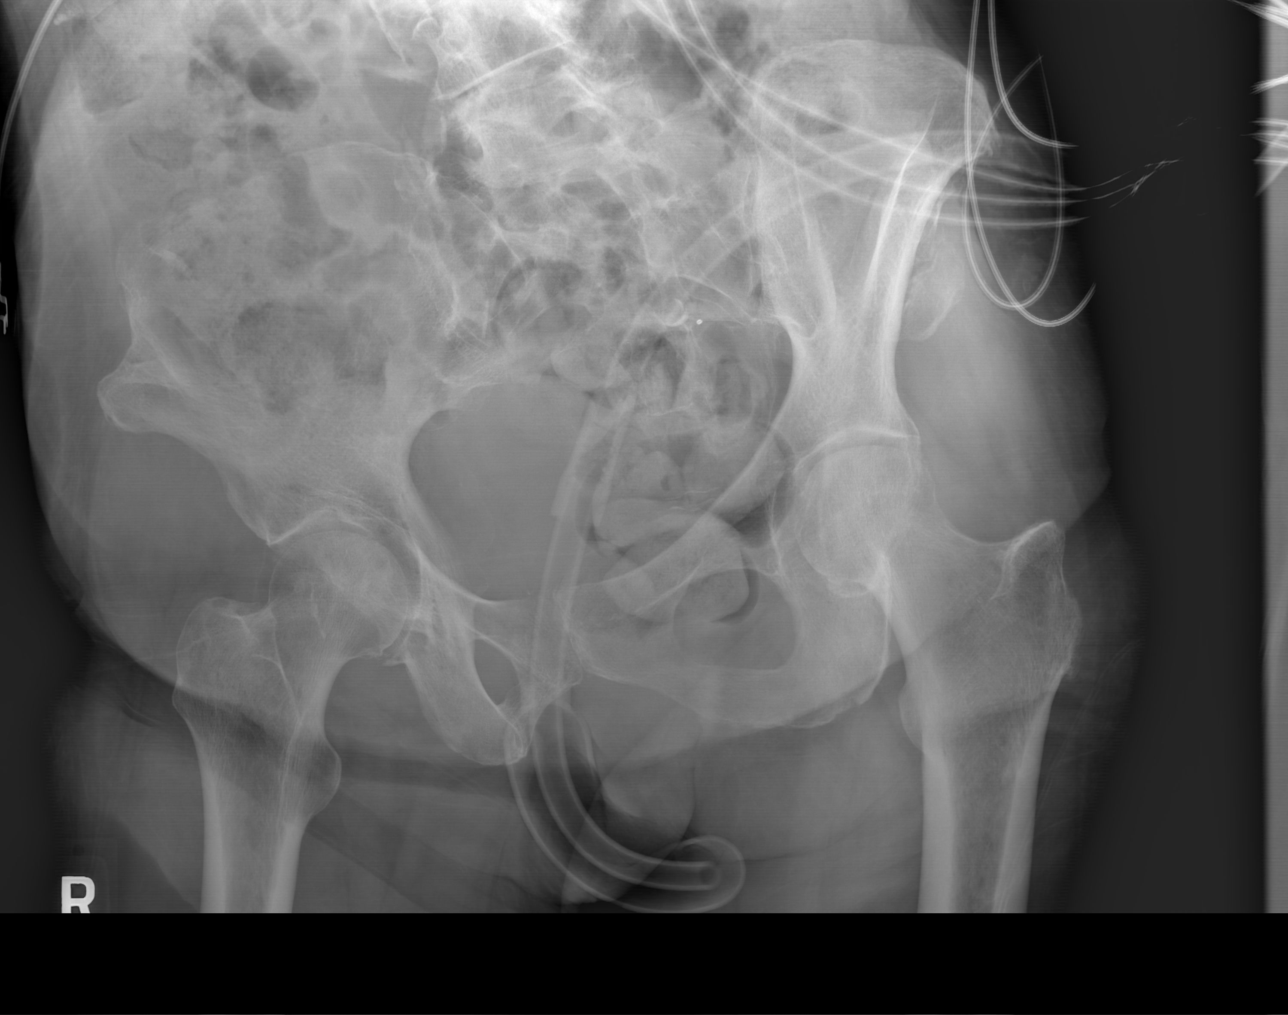

[x pelvis (2 of 2)]
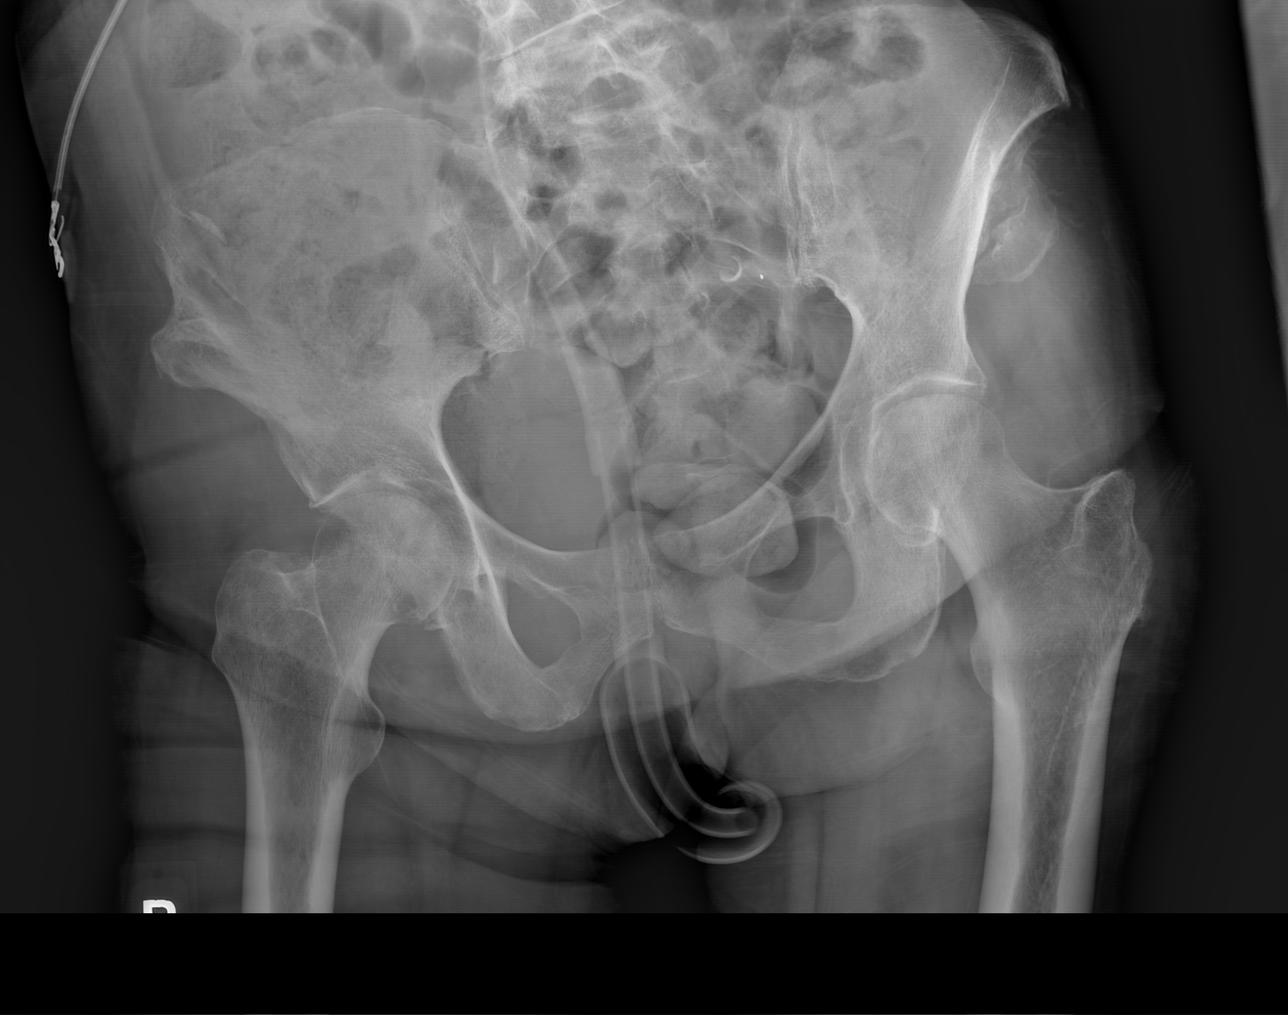

[x hip ap right (1 of 2)]
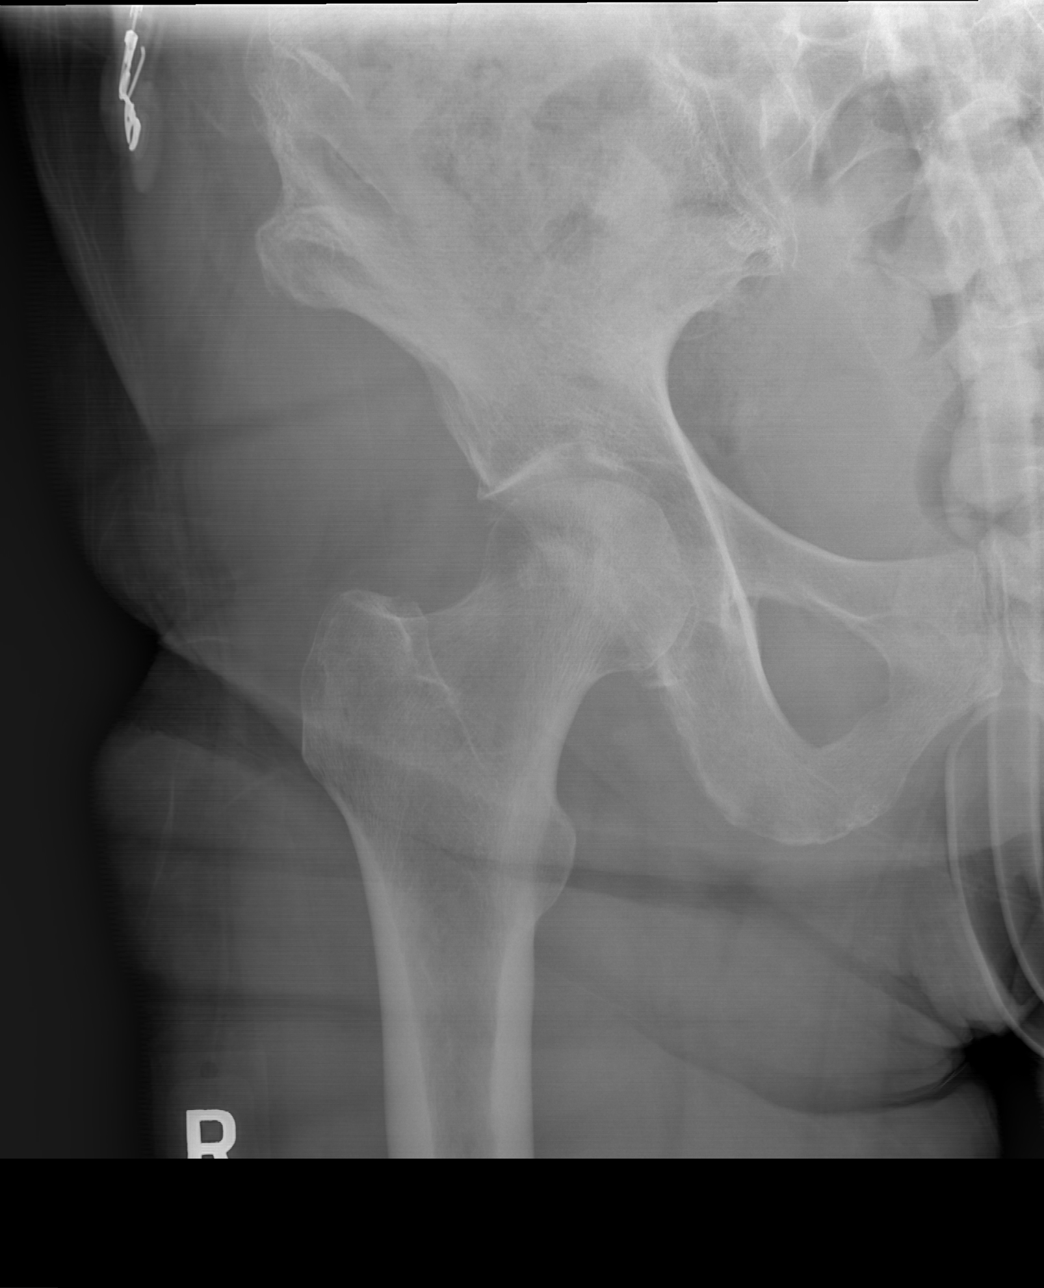

[x hip ap right (2 of 2)]
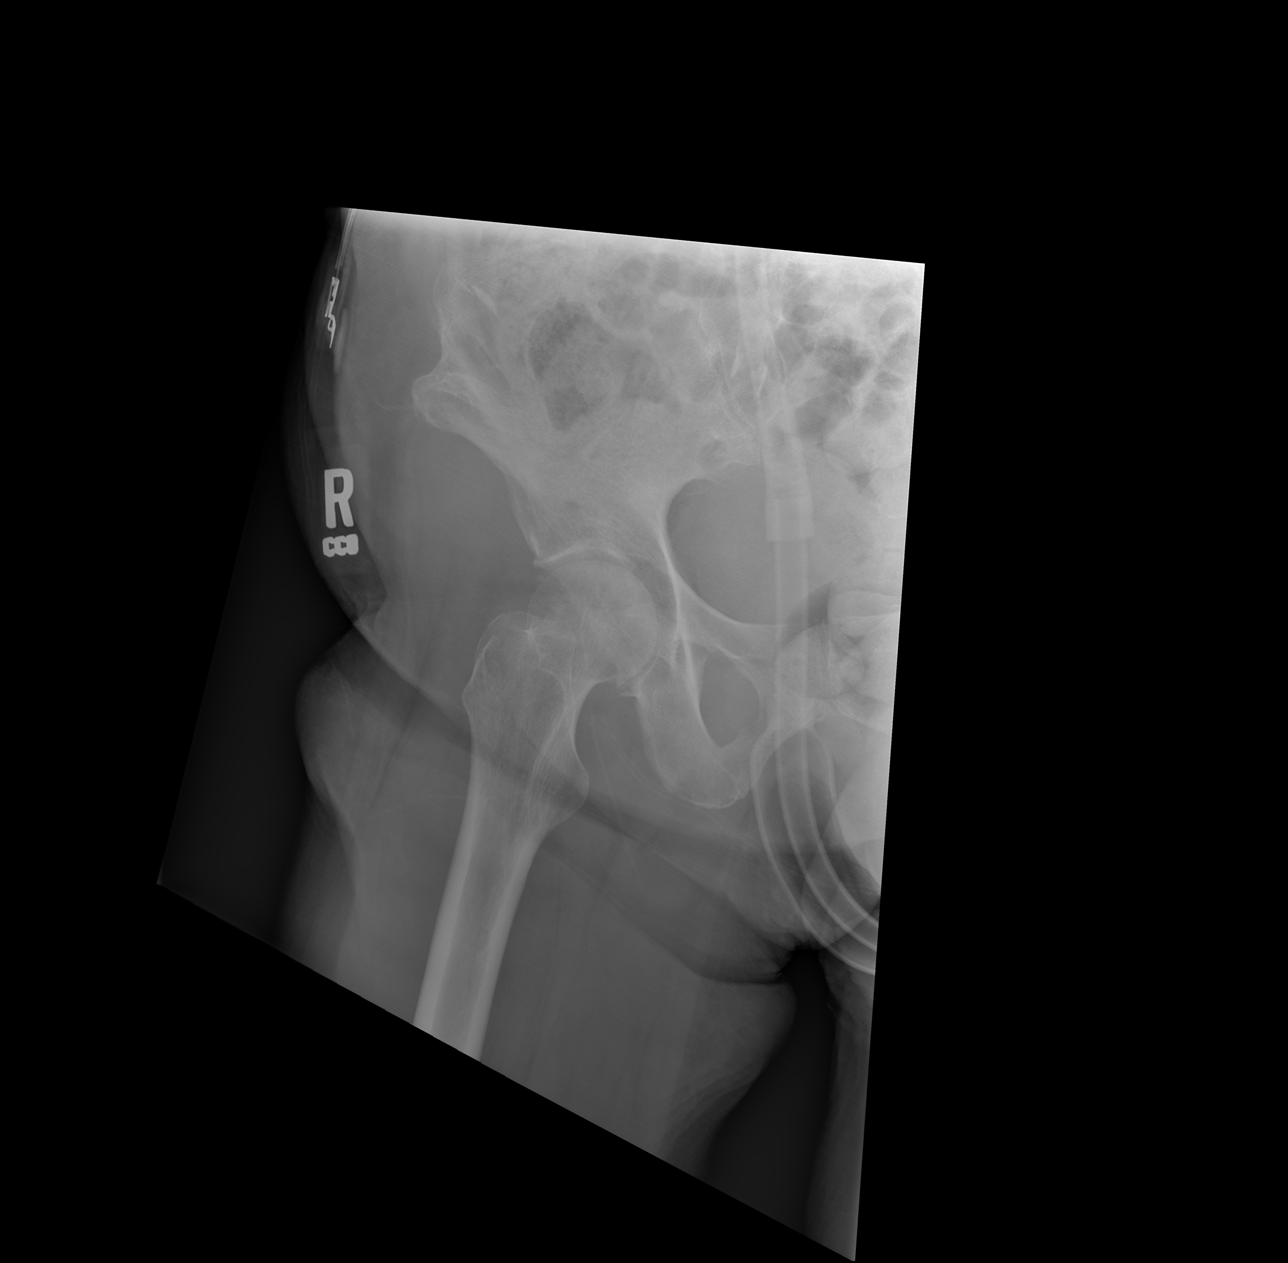

[4 of 4 positions shown; findings below may reference images not displayed]

FINDINGS: Frontal pelvis as well as frontal and lateral right hip images were
obtained. No fracture or dislocation is evident. There is mild
symmetric narrowing of both hip joints. There are exostoses arising
from each lateral iliac crest with pseudoarthrosis in the area of
exostosis on the left laterally.
IMPRESSION: No evident acute fracture or dislocation. Mild symmetric narrowing
both hip joints. Exostoses arising from each iliac crest with
pseudoarthrosis in this area on the left.

## 2019-10-23 IMAGING — CT CT KNEE*R* W/CM
3 series · 16 of 33 positions shown, 19 images · IV contrast (agent unspecified)
Comparison: None.

CLINICAL DATA: Status post fall September 2017 from the couch. Right
knee pain. Unable to get up.

EXAM:
CT OF THE RIGHT KNEE WITH CONTRAST
TECHNIQUE: Multidetector CT imaging was performed following the standard
protocol during bolus administration of intravenous contrast.

[Series 4: axial st · axial · 0.30mm/px · z∈[-342,-186]mm · 8 of 124 slices shown, 10 images]
[im 10/124  soft-tissue]
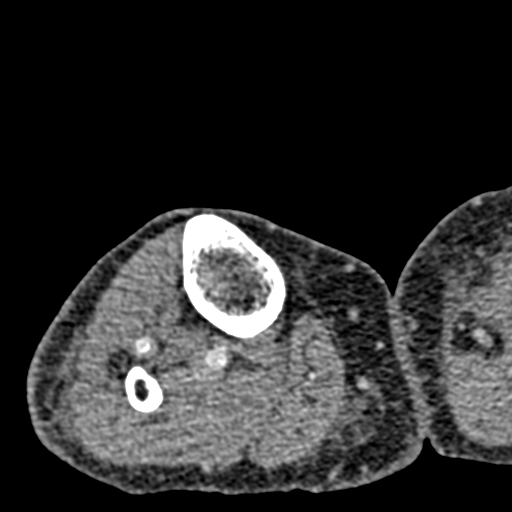
[im 10/124  bone]
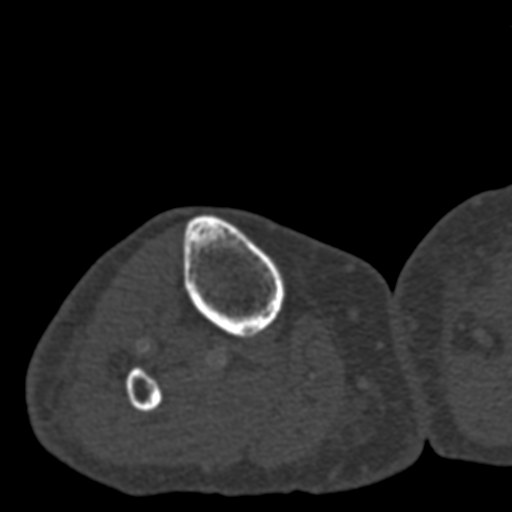
[im 29/124  bone]
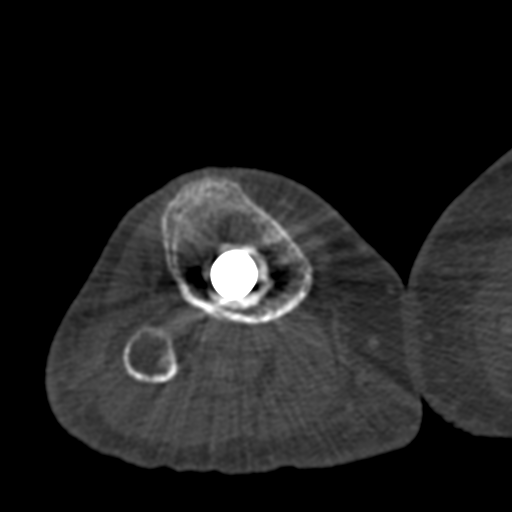
[im 38/124  bone]
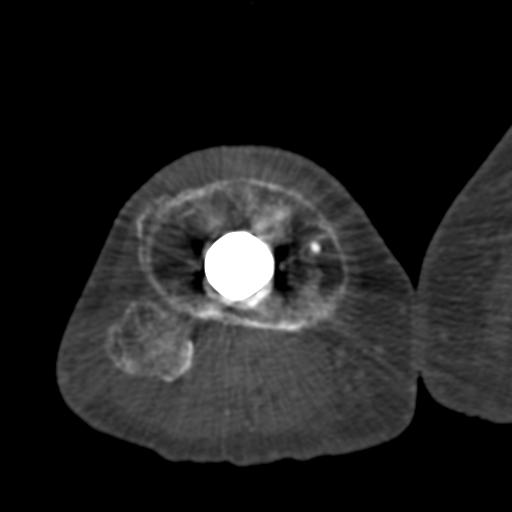
[im 57/124  bone]
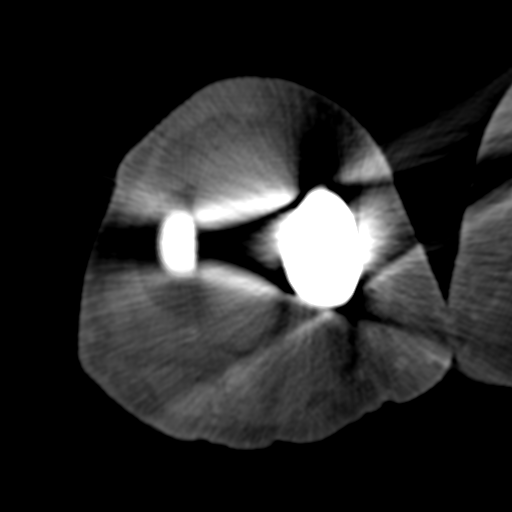
[im 67/124  soft-tissue]
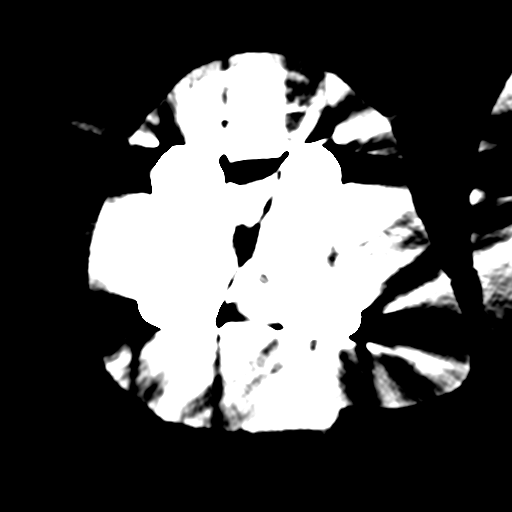
[im 67/124  bone]
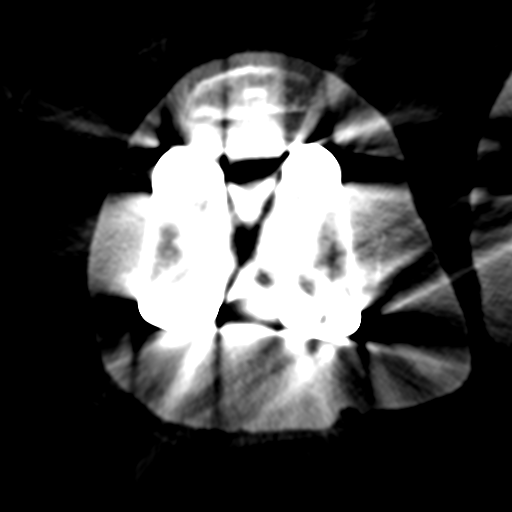
[im 86/124  bone]
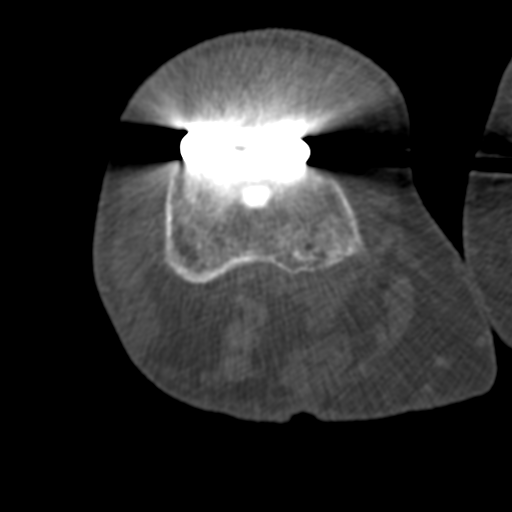
[im 95/124  bone]
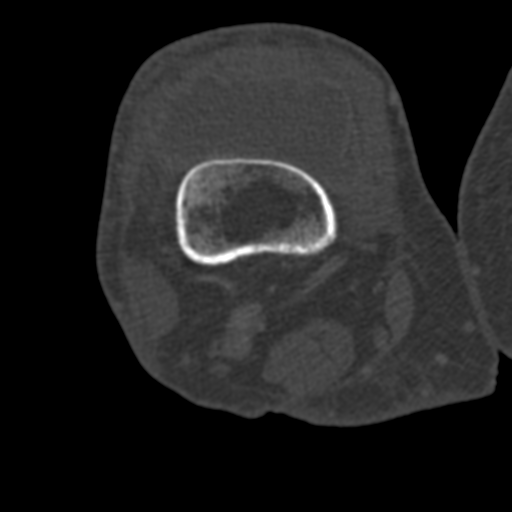
[im 114/124  bone]
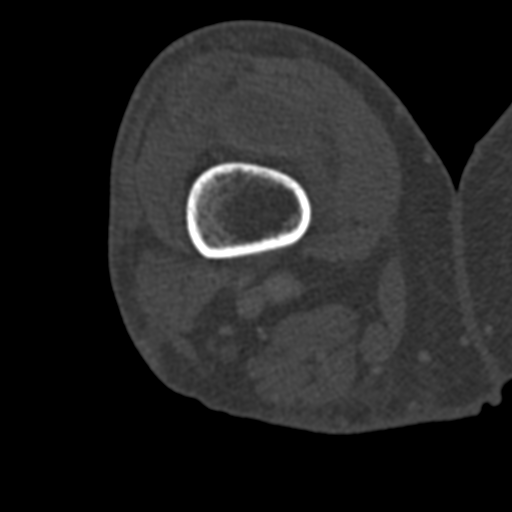

[Series 9: coronal st · coronal · 0.34mm/px · 3 of 111 slices shown]
[im 23/111  bone]
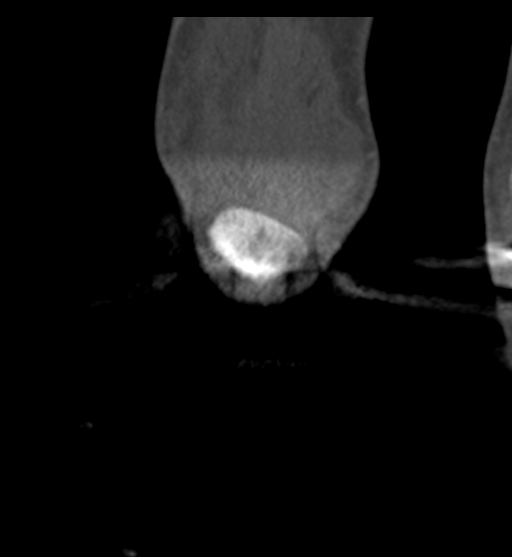
[im 45/111  bone]
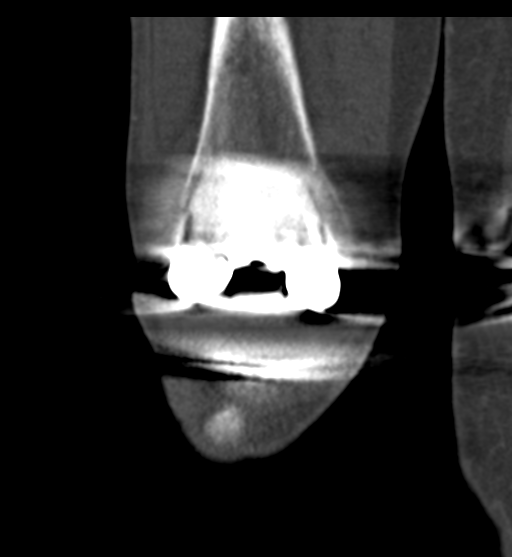
[im 67/111  bone]
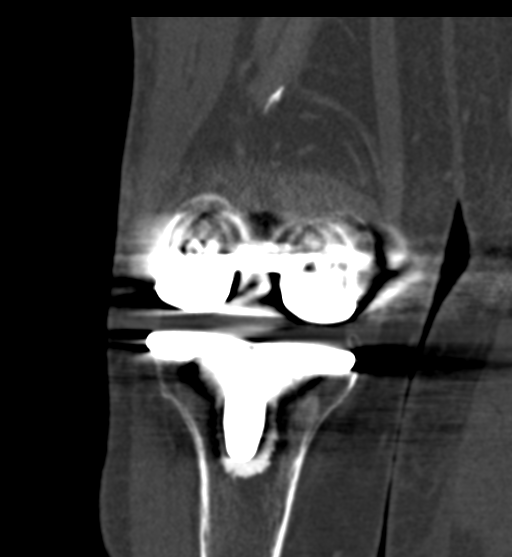

[Series 10: sagittal st · sagittal · 0.40mm/px · 5 of 110 slices shown, 6 images]
[im 37/110  bone]
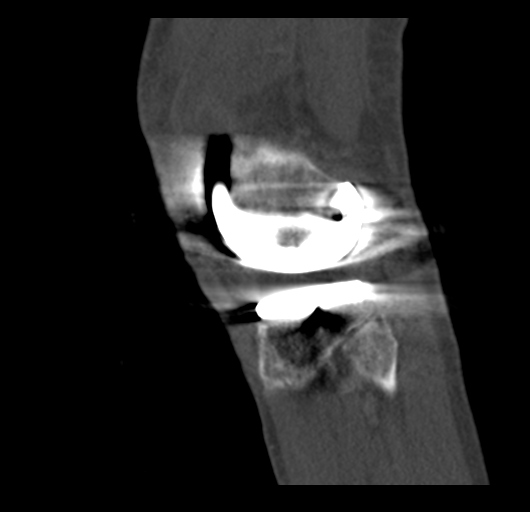
[im 46/110  bone]
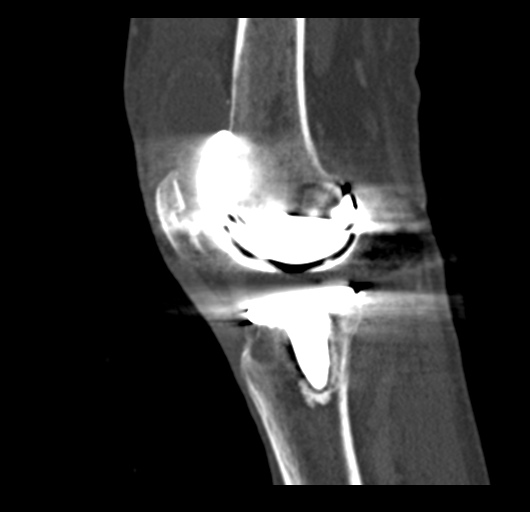
[im 55/110  soft-tissue]
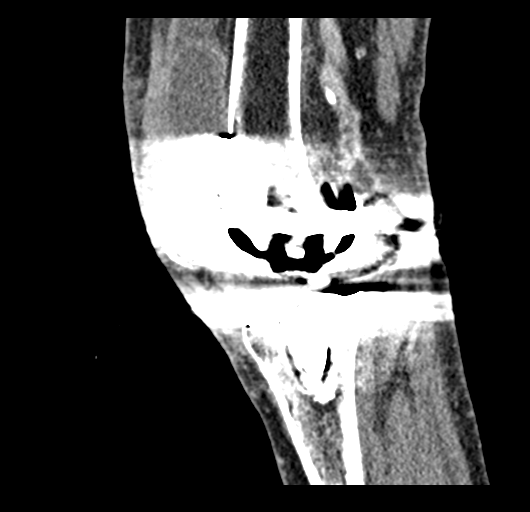
[im 55/110  bone]
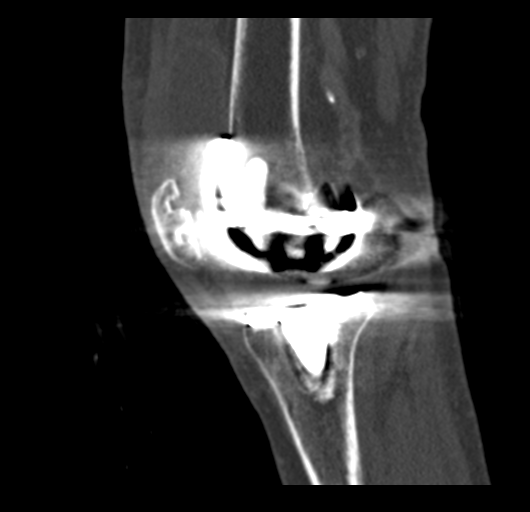
[im 64/110  bone]
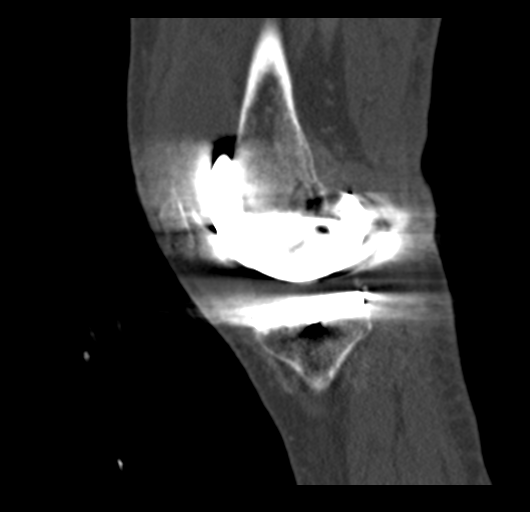
[im 73/110  bone]
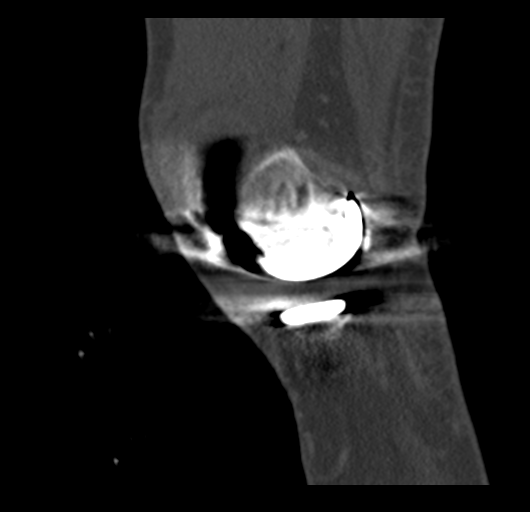

[16 of 33 positions shown; findings below may reference images not displayed]

FINDINGS: Bones/Joint/Cartilage

Right total knee arthroplasty. Thin lucency around the tibial stem
component of the arthroplasty measuring less than 1 mm concerning
for loosening. No osteolysis. No periarticular fluid collection. No
fracture or dislocation. Normal alignment. Large joint effusion with
synovial thickening.

Ligaments

Ligaments are suboptimally evaluated by CT.

Muscles and Tendons
Muscles are normal. No muscle atrophy. Intact quadriceps tendon and
patellar tendon.

Soft tissue
No fluid collection or hematoma.  No soft tissue mass.
IMPRESSION: 1. No acute osseous injury of the right knee. Right total knee
arthroplasty with a thin lucency around the tibial stem component of
the arthroplasty measuring less than 1 mm concerning for loosening.
Large right knee joint effusion with synovial thickening.

## 2019-10-23 IMAGING — CR DG KNEE COMPLETE 4+V*R*
4 series · 4 of 4 positions shown · non-contrast
Comparison: None.

CLINICAL DATA: Pain following fall

EXAM:
RIGHT KNEE - COMPLETE 4+ VIEW

[x knee ap right (1 of 4)]
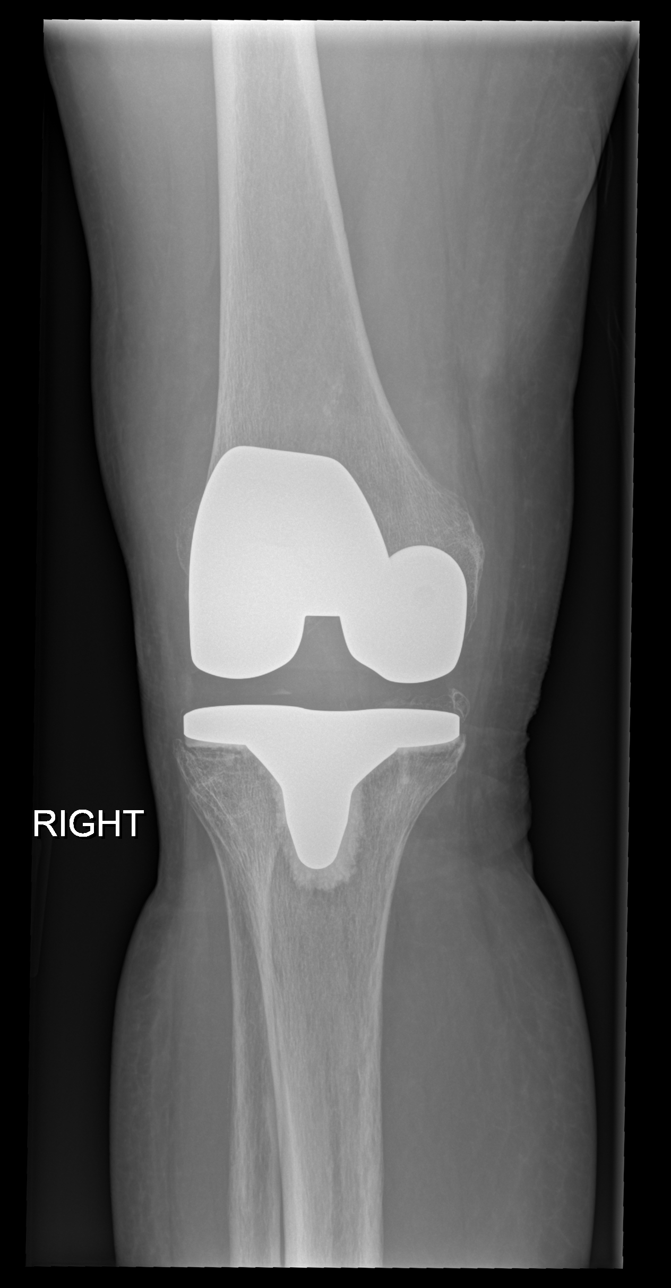

[x knee ap right (2 of 4)]
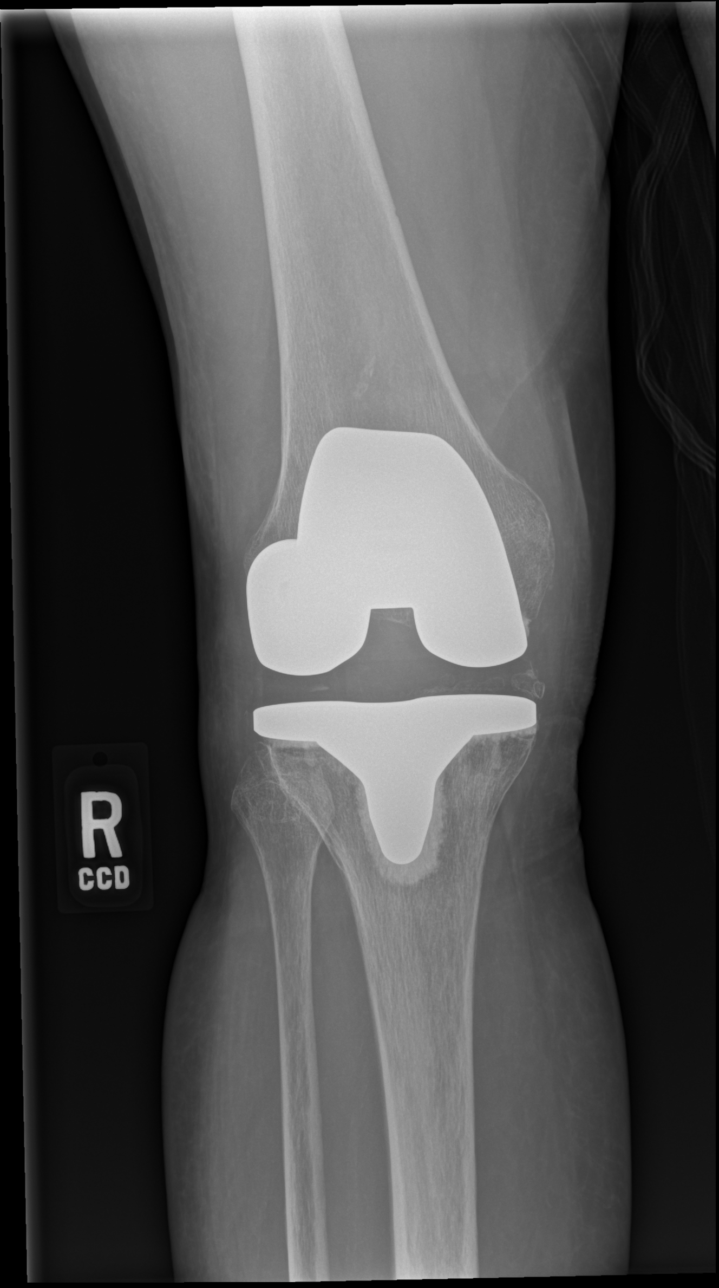

[x knee ap right (3 of 4)]
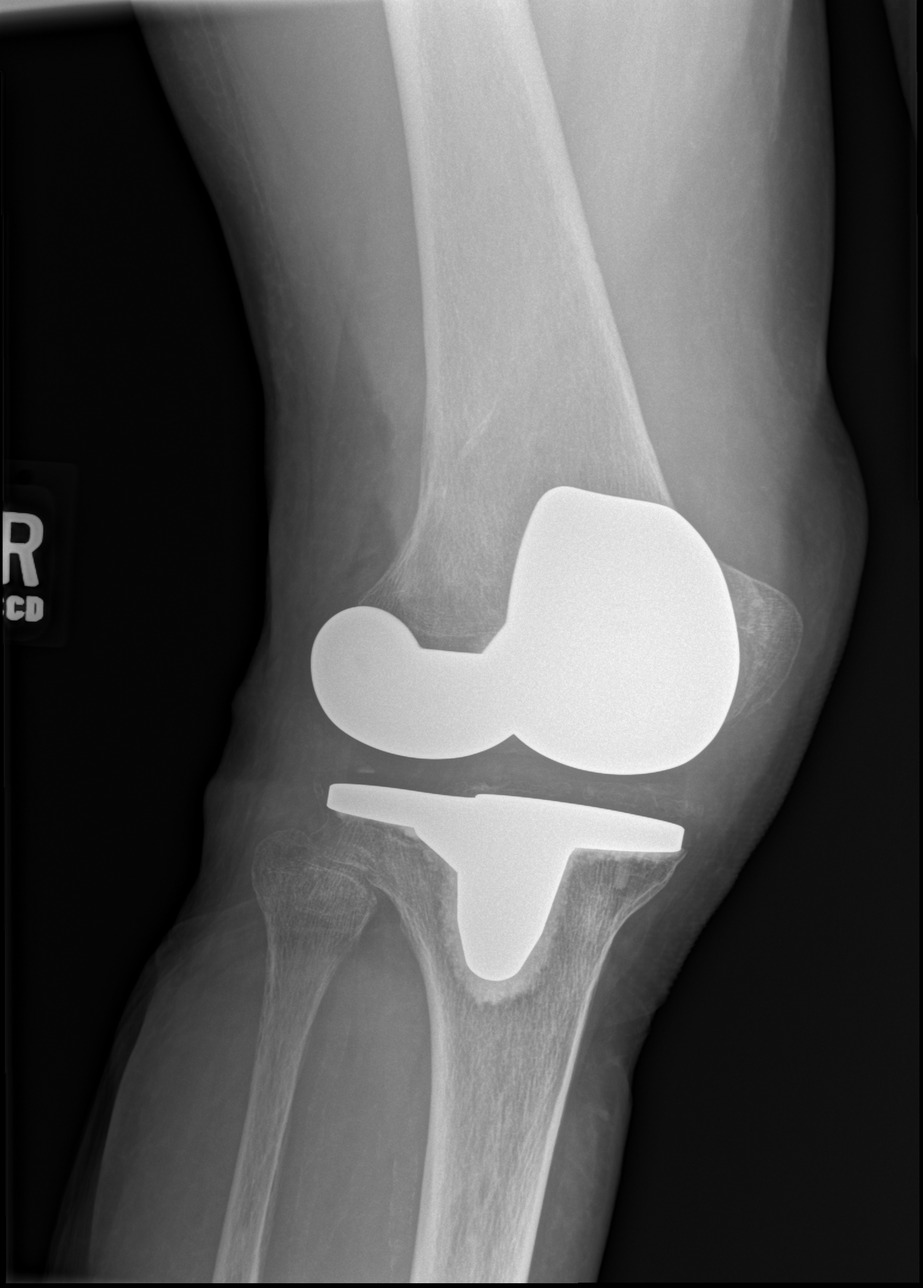

[x knee ap right (4 of 4)]
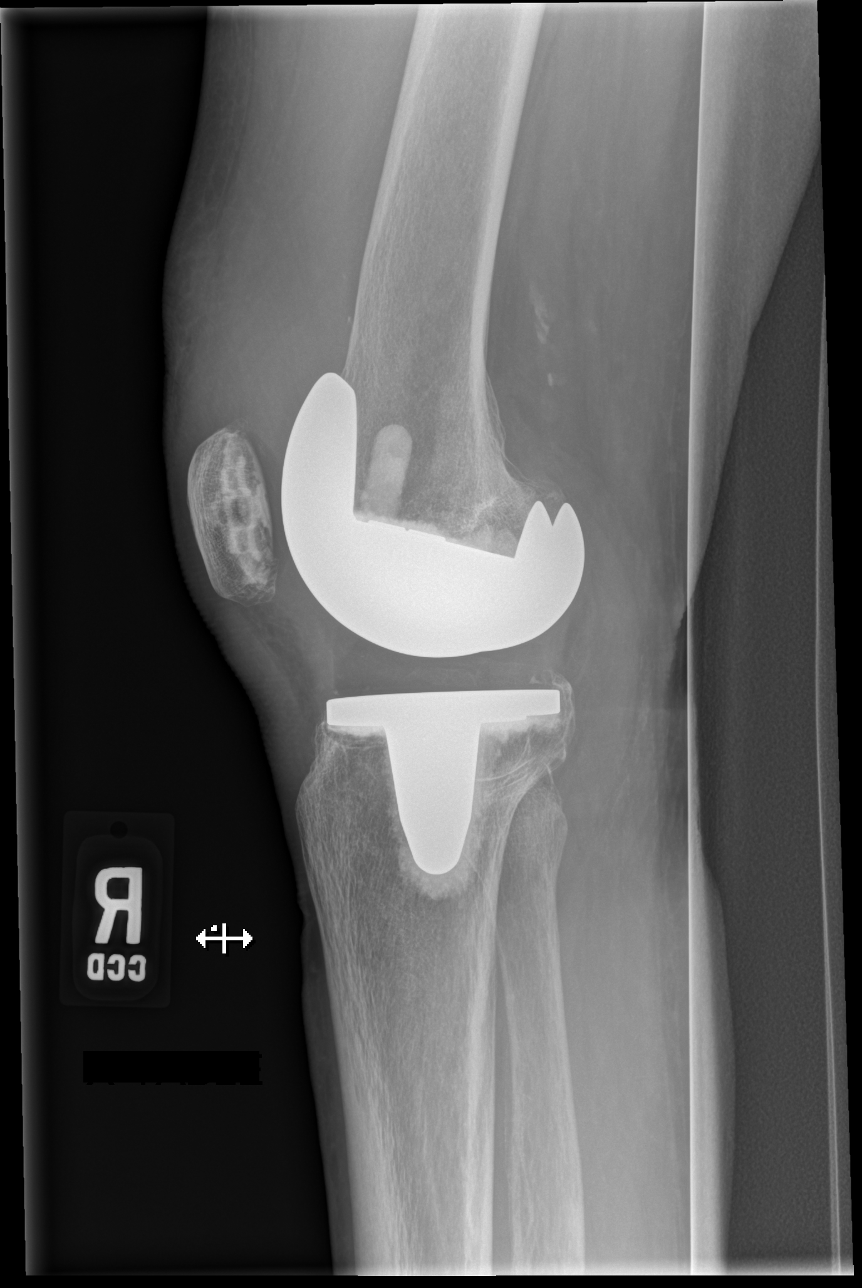

[4 of 4 positions shown; findings below may reference images not displayed]

FINDINGS: Frontal, lateral common bile oblique views were obtained. The
patient is status post total knee replacement with femoral and
tibial prosthetic components appearing well-seated. There is no
acute fracture or dislocation. No appreciable joint effusion
evident. No erosive change. There are foci of calcification in the
popliteal artery.
IMPRESSION: Status post total knee replacement with prosthetic components
well-seated. No fracture or dislocation. No appreciable joint
effusion. Popliteal artery atherosclerosis noted.
